# Patient Record
Sex: Female | Born: 1960
Health system: Southern US, Community
[De-identification: ages and names within clinical notes are randomized; demographics above are authoritative.]

## PROBLEM LIST (undated history)

## (undated) DIAGNOSIS — Z9889 Other specified postprocedural states: Secondary | ICD-10-CM

## (undated) DIAGNOSIS — K219 Gastro-esophageal reflux disease without esophagitis: Secondary | ICD-10-CM

## (undated) DIAGNOSIS — M1990 Unspecified osteoarthritis, unspecified site: Secondary | ICD-10-CM

## (undated) DIAGNOSIS — C801 Malignant (primary) neoplasm, unspecified: Secondary | ICD-10-CM

## (undated) DIAGNOSIS — R112 Nausea with vomiting, unspecified: Secondary | ICD-10-CM

## (undated) DIAGNOSIS — R7303 Prediabetes: Secondary | ICD-10-CM

## (undated) HISTORY — PX: LAPAROSCOPIC ABDOMINAL EXPLORATION: SHX6249

## (undated) HISTORY — DX: Prediabetes: R73.03

## (undated) HISTORY — DX: Gastro-esophageal reflux disease without esophagitis: K21.9

## (undated) HISTORY — DX: Unspecified osteoarthritis, unspecified site: M19.90

---

## 1997-12-10 ENCOUNTER — Other Ambulatory Visit: Admission: RE | Admit: 1997-12-10 | Discharge: 1997-12-10 | Payer: Self-pay | Admitting: Gynecology

## 1999-02-23 ENCOUNTER — Other Ambulatory Visit: Admission: RE | Admit: 1999-02-23 | Discharge: 1999-02-23 | Payer: Self-pay | Admitting: Gynecology

## 2000-09-05 ENCOUNTER — Other Ambulatory Visit: Admission: RE | Admit: 2000-09-05 | Discharge: 2000-09-05 | Payer: Self-pay | Admitting: Gynecology

## 2001-09-19 ENCOUNTER — Other Ambulatory Visit: Admission: RE | Admit: 2001-09-19 | Discharge: 2001-09-19 | Payer: Self-pay | Admitting: Gynecology

## 2002-09-27 ENCOUNTER — Other Ambulatory Visit: Admission: RE | Admit: 2002-09-27 | Discharge: 2002-09-27 | Payer: Self-pay | Admitting: Gynecology

## 2003-11-07 ENCOUNTER — Other Ambulatory Visit: Admission: RE | Admit: 2003-11-07 | Discharge: 2003-11-07 | Payer: Self-pay | Admitting: Gynecology

## 2005-04-22 ENCOUNTER — Other Ambulatory Visit: Admission: RE | Admit: 2005-04-22 | Discharge: 2005-04-22 | Payer: Self-pay | Admitting: Gynecology

## 2011-01-15 ENCOUNTER — Other Ambulatory Visit: Payer: Self-pay | Admitting: Gynecology

## 2011-01-15 DIAGNOSIS — R928 Other abnormal and inconclusive findings on diagnostic imaging of breast: Secondary | ICD-10-CM

## 2011-01-26 ENCOUNTER — Ambulatory Visit
Admission: RE | Admit: 2011-01-26 | Discharge: 2011-01-26 | Disposition: A | Discharge status: Home / Self Care | Payer: BC Managed Care – PPO | Source: Ambulatory Visit | Attending: Gynecology | Admitting: Gynecology

## 2011-01-26 DIAGNOSIS — R928 Other abnormal and inconclusive findings on diagnostic imaging of breast: Secondary | ICD-10-CM

## 2013-07-02 ENCOUNTER — Encounter: Payer: Self-pay | Admitting: Family Medicine

## 2014-09-10 LAB — HM COLONOSCOPY

## 2014-12-16 ENCOUNTER — Encounter: Payer: Self-pay | Admitting: Family Medicine

## 2016-02-27 DIAGNOSIS — L568 Other specified acute skin changes due to ultraviolet radiation: Secondary | ICD-10-CM | POA: Diagnosis not present

## 2016-02-27 DIAGNOSIS — L570 Actinic keratosis: Secondary | ICD-10-CM | POA: Diagnosis not present

## 2016-07-16 DIAGNOSIS — Z124 Encounter for screening for malignant neoplasm of cervix: Secondary | ICD-10-CM | POA: Diagnosis not present

## 2016-07-16 DIAGNOSIS — Z01419 Encounter for gynecological examination (general) (routine) without abnormal findings: Secondary | ICD-10-CM | POA: Diagnosis not present

## 2016-07-16 DIAGNOSIS — Z1231 Encounter for screening mammogram for malignant neoplasm of breast: Secondary | ICD-10-CM | POA: Diagnosis not present

## 2016-07-16 DIAGNOSIS — Z6834 Body mass index (BMI) 34.0-34.9, adult: Secondary | ICD-10-CM | POA: Diagnosis not present

## 2016-07-26 DIAGNOSIS — E669 Obesity, unspecified: Secondary | ICD-10-CM | POA: Insufficient documentation

## 2016-07-26 DIAGNOSIS — N393 Stress incontinence (female) (male): Secondary | ICD-10-CM | POA: Insufficient documentation

## 2016-08-12 DIAGNOSIS — Z0001 Encounter for general adult medical examination with abnormal findings: Secondary | ICD-10-CM | POA: Diagnosis not present

## 2016-08-18 DIAGNOSIS — M25519 Pain in unspecified shoulder: Secondary | ICD-10-CM | POA: Diagnosis not present

## 2016-08-18 DIAGNOSIS — K219 Gastro-esophageal reflux disease without esophagitis: Secondary | ICD-10-CM | POA: Diagnosis not present

## 2016-08-19 ENCOUNTER — Encounter: Payer: Self-pay | Admitting: Family Medicine

## 2016-08-19 DIAGNOSIS — M25512 Pain in left shoulder: Secondary | ICD-10-CM | POA: Diagnosis not present

## 2016-08-19 DIAGNOSIS — S4992XA Unspecified injury of left shoulder and upper arm, initial encounter: Secondary | ICD-10-CM | POA: Diagnosis not present

## 2017-07-22 DIAGNOSIS — H2513 Age-related nuclear cataract, bilateral: Secondary | ICD-10-CM | POA: Diagnosis not present

## 2017-07-22 DIAGNOSIS — H11153 Pinguecula, bilateral: Secondary | ICD-10-CM | POA: Diagnosis not present

## 2017-07-22 DIAGNOSIS — H524 Presbyopia: Secondary | ICD-10-CM | POA: Diagnosis not present

## 2017-08-03 DIAGNOSIS — Z124 Encounter for screening for malignant neoplasm of cervix: Secondary | ICD-10-CM | POA: Diagnosis not present

## 2017-08-03 DIAGNOSIS — Z6834 Body mass index (BMI) 34.0-34.9, adult: Secondary | ICD-10-CM | POA: Diagnosis not present

## 2017-08-03 DIAGNOSIS — Z1231 Encounter for screening mammogram for malignant neoplasm of breast: Secondary | ICD-10-CM | POA: Diagnosis not present

## 2017-08-03 DIAGNOSIS — Z01419 Encounter for gynecological examination (general) (routine) without abnormal findings: Secondary | ICD-10-CM | POA: Diagnosis not present

## 2017-08-03 LAB — HM PAP SMEAR: HM Pap smear: NEGATIVE

## 2017-08-06 LAB — HM PAP SMEAR: HM Pap smear: NEGATIVE

## 2017-12-02 ENCOUNTER — Encounter: Payer: Self-pay | Admitting: Family Medicine

## 2017-12-02 ENCOUNTER — Ambulatory Visit (INDEPENDENT_AMBULATORY_CARE_PROVIDER_SITE_OTHER): Payer: BLUE CROSS/BLUE SHIELD | Admitting: Family Medicine

## 2017-12-02 VITALS — BP 120/72 | HR 64 | Temp 97.7°F | Ht 65.25 in | Wt 211.5 lb

## 2017-12-02 DIAGNOSIS — E669 Obesity, unspecified: Secondary | ICD-10-CM

## 2017-12-02 DIAGNOSIS — K219 Gastro-esophageal reflux disease without esophagitis: Secondary | ICD-10-CM | POA: Diagnosis not present

## 2017-12-02 DIAGNOSIS — Z7689 Persons encountering health services in other specified circumstances: Secondary | ICD-10-CM | POA: Diagnosis not present

## 2017-12-02 LAB — CBC WITH DIFFERENTIAL/PLATELET
BASOS ABS: 0 10*3/uL (ref 0.0–0.1)
Basophils Relative: 0.9 % (ref 0.0–3.0)
EOS ABS: 0.1 10*3/uL (ref 0.0–0.7)
Eosinophils Relative: 3.5 % (ref 0.0–5.0)
HEMATOCRIT: 37.8 % (ref 36.0–46.0)
HEMOGLOBIN: 12.8 g/dL (ref 12.0–15.0)
LYMPHS ABS: 1.2 10*3/uL (ref 0.7–4.0)
LYMPHS PCT: 30.2 % (ref 12.0–46.0)
MCHC: 33.8 g/dL (ref 30.0–36.0)
MCV: 86 fl (ref 78.0–100.0)
Monocytes Absolute: 0.3 10*3/uL (ref 0.1–1.0)
Monocytes Relative: 6.5 % (ref 3.0–12.0)
NEUTROS ABS: 2.3 10*3/uL (ref 1.4–7.7)
Neutrophils Relative %: 58.9 % (ref 43.0–77.0)
Platelets: 280 10*3/uL (ref 150.0–400.0)
RBC: 4.39 Mil/uL (ref 3.87–5.11)
RDW: 14.5 % (ref 11.5–15.5)
WBC: 4 10*3/uL (ref 4.0–10.5)

## 2017-12-02 LAB — COMPREHENSIVE METABOLIC PANEL
ALBUMIN: 4.2 g/dL (ref 3.5–5.2)
ALK PHOS: 76 U/L (ref 39–117)
ALT: 21 U/L (ref 0–35)
AST: 20 U/L (ref 0–37)
BILIRUBIN TOTAL: 0.3 mg/dL (ref 0.2–1.2)
BUN: 21 mg/dL (ref 6–23)
CALCIUM: 9.9 mg/dL (ref 8.4–10.5)
CO2: 30 mEq/L (ref 19–32)
CREATININE: 0.93 mg/dL (ref 0.40–1.20)
Chloride: 102 mEq/L (ref 96–112)
GFR: 66.27 mL/min (ref >60.00)
Glucose, Bld: 101 mg/dL — ABNORMAL HIGH (ref 70–99)
Potassium: 4.4 mEq/L (ref 3.5–5.1)
Sodium: 140 mEq/L (ref 135–145)
TOTAL PROTEIN: 7 g/dL (ref 6.0–8.3)

## 2017-12-02 LAB — LIPID PANEL
Cholesterol: 178 mg/dL (ref 0–200)
HDL: 57.4 mg/dL (ref >39.00)
LDL Cholesterol: 98 mg/dL (ref 0–99)
NONHDL: 120.92
Total CHOL/HDL Ratio: 3
Triglycerides: 117 mg/dL (ref 0.0–149.0)
VLDL: 23.4 mg/dL (ref 0.0–40.0)

## 2017-12-02 LAB — VITAMIN D 25 HYDROXY (VIT D DEFICIENCY, FRACTURES): VITD: 26.62 ng/mL — AB (ref 30.00–100.00)

## 2017-12-02 LAB — HEMOGLOBIN A1C: Hgb A1c MFr Bld: 5.8 % (ref 4.6–6.5)

## 2017-12-02 LAB — TSH: TSH: 1.36 u[IU]/mL (ref 0.35–4.50)

## 2017-12-02 MED ORDER — OMEPRAZOLE 20 MG PO CPDR: 20.0000 mg | DELAYED_RELEASE_CAPSULE | Freq: Every day | ORAL | 3 refills | Status: DC

## 2017-12-02 NOTE — Patient Instructions (Addendum)
Try over the counter ranitidine 150 mg once a day for 5-7 days, if symptoms not controlled, increase to twice a day. If not controlled on daily basis, can go to omeprazole 20 mg (printed prescription provided)   Eat lots of vegetables and protein.   Gastroesophageal Reflux Disease, Adult Normally, food travels down the esophagus and stays in the stomach to be digested. However, when a person has gastroesophageal reflux disease (GERD), food and stomach acid move back up into the esophagus. When this happens, the esophagus becomes sore and inflamed. Over time, GERD can create small holes (ulcers) in the lining of the esophagus. What are the causes? This condition is caused by a problem with the muscle between the esophagus and the stomach (lower esophageal sphincter, or LES). Normally, the LES muscle closes after food passes through the esophagus to the stomach. When the LES is weakened or abnormal, it does not close properly, and that allows food and stomach acid to go back up into the esophagus. The LES can be weakened by certain dietary substances, medicines, and medical conditions, including:  Tobacco use.  Pregnancy.  Having a hiatal hernia.  Heavy alcohol use.  Certain foods and beverages, such as coffee, chocolate, onions, and peppermint.  What increases the risk? This condition is more likely to develop in:  People who have an increased body weight.  People who have connective tissue disorders.  People who use NSAID medicines.  What are the signs or symptoms? Symptoms of this condition include:  Heartburn.  Difficult or painful swallowing.  The feeling of having a lump in the throat.  Abitter taste in the mouth.  Bad breath.  Having a large amount of saliva.  Having an upset or bloated stomach.  Belching.  Chest pain.  Shortness of breath or wheezing.  Ongoing (chronic) cough or a night-time cough.  Wearing away of tooth enamel.  Weight loss.  Different  conditions can cause chest pain. Make sure to see your health care provider if you experience chest pain. How is this diagnosed? Your health care provider will take a medical history and perform a physical exam. To determine if you have mild or severe GERD, your health care provider may also monitor how you respond to treatment. You may also have other tests, including:  An endoscopy toexamine your stomach and esophagus with a small camera.  A test thatmeasures the acidity level in your esophagus.  A test thatmeasures how much pressure is on your esophagus.  A barium swallow or modified barium swallow to show the shape, size, and functioning of your esophagus.  How is this treated? The goal of treatment is to help relieve your symptoms and to prevent complications. Treatment for this condition may vary depending on how severe your symptoms are. Your health care provider may recommend:  Changes to your diet.  Medicine.  Surgery.  Follow these instructions at home: Diet  Follow a diet as recommended by your health care provider. This may involve avoiding foods and drinks such as: ? Coffee and tea (with or without caffeine). ? Drinks that containalcohol. ? Energy drinks and sports drinks. ? Carbonated drinks or sodas. ? Chocolate and cocoa. ? Peppermint and mint flavorings. ? Garlic and onions. ? Horseradish. ? Spicy and acidic foods, including peppers, chili powder, curry powder, vinegar, hot sauces, and barbecue sauce. ? Citrus fruit juices and citrus fruits, such as oranges, lemons, and limes. ? Tomato-based foods, such as red sauce, chili, salsa, and pizza with red sauce. ?  Fried and fatty foods, such as donuts, french fries, potato chips, and high-fat dressings. ? High-fat meats, such as hot dogs and fatty cuts of red and white meats, such as rib eye steak, sausage, ham, and bacon. ? High-fat dairy items, such as whole milk, butter, and cream cheese.  Eat small, frequent  meals instead of large meals.  Avoid drinking large amounts of liquid with your meals.  Avoid eating meals during the 2-3 hours before bedtime.  Avoid lying down right after you eat.  Do not exercise right after you eat. General instructions  Pay attention to any changes in your symptoms.  Take over-the-counter and prescription medicines only as told by your health care provider. Do not take aspirin, ibuprofen, or other NSAIDs unless your health care provider told you to do so.  Do not use any tobacco products, including cigarettes, chewing tobacco, and e-cigarettes. If you need help quitting, ask your health care provider.  Wear loose-fitting clothing. Do not wear anything tight around your waist that causes pressure on your abdomen.  Raise (elevate) the head of your bed 6 inches (15cm).  Try to reduce your stress, such as with yoga or meditation. If you need help reducing stress, ask your health care provider.  If you are overweight, reduce your weight to an amount that is healthy for you. Ask your health care provider for guidance about a safe weight loss goal.  Keep all follow-up visits as told by your health care provider. This is important. Contact a health care provider if:  You have new symptoms.  You have unexplained weight loss.  You have difficulty swallowing, or it hurts to swallow.  You have wheezing or a persistent cough.  Your symptoms do not improve with treatment.  You have a hoarse voice. Get help right away if:  You have pain in your arms, neck, jaw, teeth, or back.  You feel sweaty, dizzy, or light-headed.  You have chest pain or shortness of breath.  You vomit and your vomit looks like blood or coffee grounds.  You faint.  Your stool is bloody or black.  You cannot swallow, drink, or eat. This information is not intended to replace advice given to you by your health care provider. Make sure you discuss any questions you have with your health  care provider. Document Released: 02/17/2005 Document Revised: 10/08/2015 Document Reviewed: 09/04/2014 Elsevier Interactive Patient Education  [employer].

## 2017-12-02 NOTE — Progress Notes (Signed)
Subjective:    Patient ID: Nicole Johnson, female    DOB: 10/07/1960, 57 y.o.   MRN: [id number]  HPI This is a 57 yo female who presents today to establish care. She is married. 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 Has a son who lives with them (learning disability).  Had been seeing Dr. Juleen China who retired, did not care for his replacement.  Very busy life, little time for personal activities. Husband 10 years older.   Last CPE- 13 months ago Mammo- 08/03/17 Pap- gyn, menopause at age 2 Colonoscopy- 09/10/14 Tdap- unsure Flu- annual Eye- every year Dental- regular Exercise- active at work Sleep- 5-6 hours a night Diet- eats a biscuit most mornings, eats salads, likes fruit more than vegetables, drinks some soda and sweet tea.    Has history of knee pain, does well with shoe inserts. Has some wrist pain with her work.   History of GERD- currently taking omperazole tc, out of protonix. Doing ok. Triggers include soda, tomato products, rich foods, eating late.   No chest pain, no SOB, no dizziness, allergies this spring/summer- nasal congestion, PND, taking otc antihistamine with some relief. No diarrhea, no constipation, no urinary complaints.    Past Medical History:  Diagnosis Date  . Arthritis   . GERD (gastroesophageal reflux disease)    History reviewed. No pertinent surgical history. Family History  Problem Relation Age of Onset  . Diabetes Mother   . Stroke Mother   . Cancer Father   . Diabetes Father   . Diabetes Sister   . Diabetes Brother   . Heart disease Maternal Grandmother   . Cancer Maternal Grandfather   . Heart disease Maternal Grandfather    Social History   Tobacco Use  . Smoking status: Never Smoker  . Smokeless tobacco: Never Used  Substance Use Topics  . Alcohol use: Yes    Comment: occ  . Drug use: Never      Review of Systems Per HPI    Objective:   Physical Exam Physical Exam  Constitutional: Oriented  to person, place, and time. She appears well-developed and well-nourished.  HENT:  Head: Normocephalic and atraumatic.  Eyes: Conjunctivae are normal.  Neck: Normal range of motion. Neck supple.  Cardiovascular: Normal rate, regular rhythm and normal heart sounds.   Pulmonary/Chest: Effort normal and breath sounds normal.  Musculoskeletal: No edema.  Neurological: Alert and oriented to person, place, and time.  Skin: Skin is warm and dry.  Psychiatric: Normal mood and affect. Behavior is normal. Judgment and thought content normal.  Vitals reviewed.     BP 120/72 (BP Location: Right Arm, Patient Position: Sitting, Cuff Size: Large)   Pulse 64   Temp 97.7 F (36.5 C) (Oral)   Ht 5' 5.25" (1.657 m)   Wt 211 lb 8 oz (95.9 kg)   SpO2 97%   BMI 34.93 kg/m      Assessment & Plan:  1. Encounter to establish care - Will request records from previous provider - Follow up in 1 year unless sooner follow up   2. Obesity (BMI 30.0-34.9) - discussed healthier food choices - encouraged increased sleep, time for activities she enjoys - CBC with Differential - Comprehensive metabolic panel - TSH - Vitamin D, 25-hydroxy - Hemoglobin A1c - Lipid Panel  3. Gastroesophageal reflux disease, esophagitis presence not specified - Provided written and verbal information regarding diagnosis and treatment. - will try otc H2 blocker and if not adequate  control, will try omeprazole 20 mg (printed prescription provided) and if not adequate symptom management can go back to Protonix   Olean Reeeborah Adlean Hardeman, FNP-BC  [facility], [facility]  12/02/2017 9:21 AM

## 2017-12-26 ENCOUNTER — Encounter: Payer: Self-pay | Admitting: Family Medicine

## 2018-08-24 ENCOUNTER — Ambulatory Visit: Payer: Self-pay | Admitting: Family Medicine

## 2018-08-24 ENCOUNTER — Ambulatory Visit (INDEPENDENT_AMBULATORY_CARE_PROVIDER_SITE_OTHER): Payer: BLUE CROSS/BLUE SHIELD | Admitting: Primary Care

## 2018-08-24 ENCOUNTER — Encounter: Payer: Self-pay | Admitting: Primary Care

## 2018-08-24 ENCOUNTER — Other Ambulatory Visit: Payer: Self-pay

## 2018-08-24 VITALS — BP 148/74 | HR 73 | Temp 98.2°F | Ht 65.25 in | Wt 221.0 lb

## 2018-08-24 DIAGNOSIS — M25562 Pain in left knee: Secondary | ICD-10-CM | POA: Diagnosis not present

## 2018-08-24 MED ORDER — DICLOFENAC SODIUM 75 MG PO TBEC: 75.0000 mg | DELAYED_RELEASE_TABLET | Freq: Two times a day (BID) | ORAL | 0 refills | Status: DC | PRN

## 2018-08-24 NOTE — Telephone Encounter (Addendum)
Lucynda RN also noted:  Here are the triage notes for Nicole Johnson's pt.  I let her know someone would be calling her in regards to an appt.  I verified her e mail and phone number [phone] for a video visit if needed.  She was agreeable to this plan   Pt scheduled webex with Allayne Gitelman NP 08/24/18 at 11 AM. Please see nse triage note from [facility].

## 2018-08-24 NOTE — Progress Notes (Signed)
Subjective:    Patient ID: Nicole Johnson, female    DOB: 03/18/1961, 58 y.o.   MRN: [id number]  HPI  Nicole Johnson is a 58 year old female with a history of obesity, chronic knee pain who presents today with a chief complaint of knee pain.  Her pain is located to the posterior right knee which began four days ago. Her pain is worse when sitting for prolonged periods of time and when rising from a seated position, also with knee flexion. 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 She also reports radiation of pain down to her right lower extremity to mid calf. She describes this as achy. She's been icing her knee, also taking Advil 400 mg every two hours with little temporary improvement.    She denies falls/injury, numbness/tinlging, weakness, color change, back pain. She think she's noticed slight swelling to the popliteal fossa. She has a history of chronic left knee pain and was once receiving injections, no recent injection as her left knee hasn't bothered.   Review of Systems  Musculoskeletal: Positive for arthralgias.  Skin: Negative for color change.  Neurological: Negative for weakness and numbness.       Past Medical History:  Diagnosis Date  . Arthritis   . GERD (gastroesophageal reflux disease)      Social History   Socioeconomic History  . Marital status: Married    Spouse name: Not on file  . Number of children: Not on file  . Years of education: Not on file  . Highest education level: Not on file  Occupational History  . Not on file  Social Needs  . Financial resource strain: Not on file  . Food insecurity:    Worry: Not on file    Inability: Not on file  . Transportation needs:    Medical: Not on file    Non-medical: Not on file  Tobacco Use  . Smoking status: Never Smoker  . Smokeless tobacco: Never Used  Substance and Sexual Activity   . Alcohol use: Yes    Comment: occ  . Drug use: Never  . Sexual activity: Yes    Partners: Male  Lifestyle  . Physical activity:    Days per week: Not on file    Minutes per session: Not on file  . Stress: Not on file  Relationships  . Social connections:    Talks on phone: Not on file    Gets together: Not on file    Attends religious service: Not on file    Active member of club or organization: Not on file    Attends meetings of clubs or organizations: Not on file    Relationship status: Not on file  . Intimate partner violence:    Fear of current or ex partner: Not on file    Emotionally abused: Not on file    Physically abused: Not on file    Forced sexual activity: Not on file  Other Topics Concern  . Not on file  Social History Narrative  . Not on file    No past surgical history on file.  Family History  Problem Relation Age of Onset  . Diabetes Mother   . Stroke Mother   . Cancer Father   . Diabetes Father   . Diabetes Sister   . Diabetes Brother   . Heart disease Maternal Grandmother   .  Cancer Maternal Grandfather   . Heart disease Maternal Grandfather     Allergies  Allergen Reactions  . Meloxicam Nausea Only  . Tramadol Nausea Only    Current Outpatient Medications on File Prior to Visit  Medication Sig Dispense Refill  . Calcium Citrate-Vitamin D (CALCITRATE/VITAMIN D PO) Calcitrate-Vitamin D    . omeprazole (PRILOSEC) 20 MG capsule Take 1 capsule (20 mg total) by mouth daily. 90 capsule 3  . pantoprazole (PROTONIX) 40 MG tablet pantoprazole 40 mg tablet,delayed release     No current facility-administered medications on file prior to visit.     BP (!) 148/74   Pulse 73   Temp 98.2 F (36.8 C) (Oral)   Ht 5' 5.25" (1.657 m)   Wt 221 lb (100.2 kg)   LMP 01/11/2011   SpO2 98%   BMI 36.50 kg/m    Objective:   Physical Exam  Musculoskeletal:     Right knee: She exhibits decreased range of motion. She exhibits no swelling. No  tenderness found.       Legs:     Comments: Decrease in ROM with flexion, no obvious swelling to right popliteal fossa or anterior knee. 5/5 strength to bilateral lower extremities.   Skin: Skin is warm and dry.           Assessment & Plan:

## 2018-08-24 NOTE — Assessment & Plan Note (Signed)
Likely tendon aggravation from working on uneven surfaces on the roof. No trauma.  Exam today fairly stable, no alarm signs.  Will have her try diclofenac ER 75 mg BID PRN, also alternate with Tylenol if needed. Strongly advised she work on stretching and ROM exercises. She will update next week if no improvement.

## 2018-08-24 NOTE — Telephone Encounter (Signed)
I was painting a house for 2 days.   I was up on a ladder for 2 days.   I get shots in my knee for hematoid.  Do y'all do shots there in your office?   The doctor that has done the shots before I have not seen in 3 yrs.   I didn't know if I would need to go through my primary care physician to get that done or what.   You're having numbness with tingling in right foot.   Knee started hurting 2 days ago.  It's getting worse the pain in the knee.  Due to the coronavirus pandemic they are doing video chat/phone call visits.   She is ok with this.   I verified her phone number and e mail address.  I let her know someone would call her from the office.  I sent these notes to [facility] office.   Reason for Disposition . [1] MODERATE pain (e.g., interferes with normal activities, limping) AND [2] present > 3 days  Answer Assessment - Initial Assessment Questions 1. LOCATION and RADIATION: "Where is the pain located?"      Behind my right knee with numbness and tingling in my right foot. 2. QUALITY: "What does the pain feel like?"  (e.g., sharp, dull, aching, burning)     I can walk but carefully.  I'm having trouble putting weight on my leg.  Numbness and tingling.   It really started bothering me yesterday afternoon. Do y'all give shots in knees. 3. SEVERITY: "How bad is the pain?" "What does it keep you from doing?"   (Scale 1-10; or mild, moderate, severe)   -  MILD (1-3): doesn't interfere with normal activities    -  MODERATE (4-7): interferes with normal activities (e.g., work or school) or awakens from sleep, limping    -  SEVERE (8-10): excruciating pain, unable to do any normal activities, unable to walk     5 on pain scale. 4. ONSET: "When did the pain start?" "Does it come and go, or is it there all the time?"     Yesterday afternoon. 5. RECURRENT: "Have you had this pain before?" If so, ask: "When, and what happened then?"     No swelling in right leg.  I have discomfort in my  calf but no swelling or warmth.     No history of blood clots.     6. 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   I work in [employer]. 7. AGGRAVATING FACTORS: "What makes the knee pain worse?" (e.g., walking, climbing stairs, running)     Walking.   I have a pulling sensation  From back of my knee down to not even my ankle. 8. ASSOCIATED SYMPTOMS: "Is there any swelling or redness of the knee?"     No 9. OTHER SYMPTOMS: "Do you have any other symptoms?" (e.g., chest pain, difficulty breathing, fever, calf pain)     See above 10. PREGNANCY: "Is there any chance you are pregnant?" "When was your last menstrual period?"       No  Protocols used: KNEE PAIN-A-AH

## 2018-08-24 NOTE — Patient Instructions (Signed)
You may take diclofenac Er 75 mg tablets twice daily as needed for knee pain.  You may also alternate with Tylenol 500 mg every 8 hours as needed.  Work on stretching and range of motion exercises as discussed.   Consider purchasing a knee sleeve for support.  Please call us if no improvement by early next week.   It was a pleasure meeting you!

## 2018-08-24 NOTE — Addendum Note (Signed)
Addended by: Patience Musca on: 08/24/2018 09:16 AM   Modules accepted: Kipp Brood

## 2018-09-11 ENCOUNTER — Encounter: Payer: Self-pay | Admitting: Family Medicine

## 2018-09-11 ENCOUNTER — Other Ambulatory Visit: Payer: Self-pay

## 2018-09-11 ENCOUNTER — Ambulatory Visit (INDEPENDENT_AMBULATORY_CARE_PROVIDER_SITE_OTHER)
Admission: RE | Admit: 2018-09-11 | Discharge: 2018-09-11 | Disposition: A | Discharge status: Home / Self Care | Payer: BLUE CROSS/BLUE SHIELD | Source: Ambulatory Visit | Attending: Family Medicine | Admitting: Family Medicine

## 2018-09-11 ENCOUNTER — Ambulatory Visit (INDEPENDENT_AMBULATORY_CARE_PROVIDER_SITE_OTHER): Payer: BLUE CROSS/BLUE SHIELD | Admitting: Family Medicine

## 2018-09-11 VITALS — BP 130/66 | HR 91 | Temp 98.4°F | Ht 65.25 in | Wt 219.5 lb

## 2018-09-11 DIAGNOSIS — M25561 Pain in right knee: Secondary | ICD-10-CM

## 2018-09-11 DIAGNOSIS — M7051 Other bursitis of knee, right knee: Secondary | ICD-10-CM

## 2018-09-11 MED ORDER — METHYLPREDNISOLONE ACETATE 40 MG/ML IJ SUSP
80.0000 mg | Freq: Once | INTRAMUSCULAR | Status: AC
  Administered 2018-09-11: 13:00:00 80 mg via INTRA_ARTICULAR

## 2018-09-11 NOTE — Progress Notes (Signed)
Nicole Lalli T. Nicole Borras, MD Primary Care and Sports Medicine [facility] at [facility] [address] [place], [address] Phone: [phone]  FAX: [phone]  Nicole Johnson - 58 y.o. female  MRN [id number]  Date of Birth: [birth year]  Visit Date: 09/11/2018  PCP: Nicole Belfast, FNP  Referred by: Nicole Belfast, FNP  Chief Complaint  Patient presents with  . Knee Pain    Right   Subjective:   Nicole Johnson is a 58 y.o. very pleasant female patient who presents with the following:  Very pleasant 58 year old lady who has a history of left-sided knee arthritis and she presents today after an insidious onset of right-sided knee pain.  No significant swelling and no mechanical symptoms or buckling.  She saw Mrs. Clark in the office 17 days ago, and that point she recommended that she use some ice and gave her some diclofenac.  At this point, her symptoms have not really improved at all, and she is having quite a bit of pain.  She is having some pain distal to the joint line and she is having pain on the medial joint line greater than the patellofemoral joint.  She also was wearing a compression knee brace, and this caused some indentation and uncomfortable feeling within the last 2 weeks.  Wore a knee brace and compressed a lot.  Was pretty tight, about a 17 days ago. Felt pretty good and felt cut in some.  Had been in her.  No injury.   PES R Joint line med PF  Past Medical History, Surgical History, Social History, Family History, Problem List, Medications, and Allergies have been reviewed and updated if relevant.  Patient Active Problem List   Diagnosis Date Noted  . Acute pain of left knee 08/24/2018  . Obesity (BMI 30.0-34.9) 12/02/2017  . Gastroesophageal reflux disease 12/02/2017  . Obesity with body mass index 30 or greater 07/26/2016  . Stress incontinence of urine 07/26/2016    Past Medical History:  Diagnosis Date  . Arthritis    . GERD (gastroesophageal reflux disease)     History reviewed. No pertinent surgical history.  Social History   Socioeconomic History  . Marital status: Married    Spouse name: Not on file  . Number of children: Not on file  . Years of education: Not on file  . Highest education level: Not on file  Occupational History  . Not on file  Social Needs  . Financial resource strain: Not on file  . Food insecurity:    Worry: Not on file    Inability: Not on file  . Transportation needs:    Medical: Not on file    Non-medical: Not on file  Tobacco Use  . Smoking status: Never Smoker  . Smokeless tobacco: Never Used  Substance and Sexual Activity  . Alcohol use: Yes    Comment: occ  . Drug use: Never  . Sexual activity: Yes    Partners: Male  Lifestyle  . Physical activity:    Days per week: Not on file    Minutes per session: Not on file  . Stress: Not on file  Relationships  . Social connections:    Talks on phone: Not on file    Gets together: Not on file    Attends religious service: Not on file    Active member of club or organization: Not on file    Attends meetings of clubs or organizations: Not on  file    Relationship status: Not on file  . Intimate partner violence:    Fear of current or ex partner: Not on file    Emotionally abused: Not on file    Physically abused: Not on file    Forced sexual activity: Not on file  Other Topics Concern  . Not on file  Social History Narrative  . Not on file    Family History  Problem Relation Age of Onset  . Diabetes Mother   . Stroke Mother   . Cancer Father   . Diabetes Father   . Diabetes Sister   . Diabetes Brother   . Heart disease Maternal Grandmother   . Cancer Maternal Grandfather   . Heart disease Maternal Grandfather     Allergies  Allergen Reactions  . Meloxicam Nausea Only  . Tramadol Nausea Only    Medication list reviewed and updated in full in [place] [facility].  GEN: No fevers, chills.  Nontoxic. Primarily MSK c/o today. MSK: Detailed in the HPI GI: tolerating PO intake without difficulty Neuro: No numbness, parasthesias, or tingling associated. Otherwise the pertinent positives of the ROS are noted above.   Objective:   BP 130/66   Pulse 91   Temp 98.4 F (36.9 C) (Oral)   Ht 5' 5.25" (1.657 m)   Wt 219 lb 8 oz (99.6 kg)   LMP 01/11/2011   BMI 36.25 kg/m    GEN: WDWN, NAD, Non-toxic, Alert & Oriented x 3 HEENT: Atraumatic, Normocephalic.  Ears and Nose: No external deformity. EXTR: No clubbing/cyanosis/edema NEURO: Normal gait.  PSYCH: Normally interactive. Conversant. Not depressed or anxious appearing.  Calm demeanor.    Full extension, flexion to 115 degrees.  This is on the right knee.  Stable to varus and valgus stress.  Lockman is negative.  Anterior and posterior drawer is negative.  Flexion pinch is positive.  McMurray's is positive for pain only.  The patient has significant pain at the pes bursa.  Pain with loading of the medial lateral patellar facets.  Medial joint line tenderness is significant.  Radiology: Dg Knee 4 Views W/patella Right  Result Date: 09/11/2018 CLINICAL DATA:  58 year old female with right knee pain and no injury EXAM: RIGHT KNEE - COMPLETE 4+ VIEW COMPARISON:  None. FINDINGS: Right: No acute displaced fracture. No significant degenerative changes. No evidence of joint effusion. No radiopaque foreign body or focal soft tissue swelling. Relatively unremarkable sunrise view. Left: Medial joint space narrowing with marginal osteophyte formation and no displaced fracture. IMPRESSION: No acute bony abnormality. No significant right-sided osteoarthritis. Early osteoarthritis on the left, greatest at the medial compartment. Electronically Signed   By: Gilmer MorJaime  Wagner D.O.   On: 09/11/2018 12:36     Assessment and Plan:   Right knee pain, unspecified chronicity - Plan: DG Knee 4 Views W/Patella Right  Pes anserinus bursitis of right  knee - Plan: DG Knee 4 Views W/Patella Right, methylPREDNISolone acetate (DEPO-MEDROL) injection 80 mg  Degenerative meniscal tear is highest on the differential, or the patient's osteoarthritic change in the medial and patellofemoral compartment is worse than apparent on the plain film.  I am going to do an intra-articular injection as well as a pes bursa injection and have the patient start doing some gentle range of motion and strengthening.  Aspiration/Injection Procedure Note Nicole Johnson [birth year] Date of procedure: 09/11/2018  Procedure: Large Joint Aspiration / Injection of Knee, R Indications: Pain  Procedure Details Patient verbally consented to procedure. Risks (  including potential rare risk of infection), benefits, and alternatives explained. Sterilely prepped with Chloraprep. Ethyl cholride used for anesthesia. 6 cc Lidocaine 1% mixed with 1 mL Depo-Medrol 40 mg injected using the anteromedial approach without difficulty. No complications with procedure and tolerated well. Patient had decreased pain post-injection.  Medication: Depo-Medrol 40 mg  Aspiration/Injection Procedure Note Nicole Johnson July 05, 1960 Date of procedure: 09/11/2018  Procedure: Large Joint Aspiration / Injection of Knee, Pes Bursa, R Indications: Pain  Procedure Details Verbal consent was obtained. Risks (including rare infection, skin lightening, and potential atrophy), benefits, and alternatives explained. Sterilely prepped with Chloraprep. Ethyl chloride for anesthesia. Under sterile conditions, 2 cc of Lidocaine 1% and 1 cc of Depo-Medrol 40 mg injected directly on the pes anserinus perpendicularly taking the needle to bone then slightly withdrawing. No resistance encountered. No complications with procedure and tolerated well. Patient had decreased pain post-injection. 22 gauge 1 1/2 inch needle  Medication: Depo-Medrol 40 mg   Follow-up: No follow-ups on file.  Meds ordered this encounter   Medications  . methylPREDNISolone acetate (DEPO-MEDROL) injection 80 mg   Orders Placed This Encounter  Procedures  . DG Knee 4 Views W/Patella Right    Signed,  Karleen Hampshire T. Asianae Minkler, MD   Outpatient Encounter Medications as of 09/11/2018  Medication Sig  . Calcium Citrate-Vitamin D (CALCITRATE/VITAMIN D PO) Calcitrate-Vitamin D  . diclofenac (VOLTAREN) 75 MG EC tablet Take 1 tablet (75 mg total) by mouth 2 (two) times daily as needed for moderate pain.  Marland Kitchen omeprazole (PRILOSEC) 20 MG capsule Take 1 capsule (20 mg total) by mouth daily.  . [DISCONTINUED] pantoprazole (PROTONIX) 40 MG tablet pantoprazole 40 mg tablet,delayed release  . [EXPIRED] methylPREDNISolone acetate (DEPO-MEDROL) injection 80 mg    No facility-administered encounter medications on file as of 09/11/2018.

## 2018-11-01 DIAGNOSIS — Z6835 Body mass index (BMI) 35.0-35.9, adult: Secondary | ICD-10-CM | POA: Diagnosis not present

## 2018-11-01 DIAGNOSIS — Z01419 Encounter for gynecological examination (general) (routine) without abnormal findings: Secondary | ICD-10-CM | POA: Diagnosis not present

## 2018-11-01 DIAGNOSIS — Z1231 Encounter for screening mammogram for malignant neoplasm of breast: Secondary | ICD-10-CM | POA: Diagnosis not present

## 2018-11-01 LAB — HM MAMMOGRAPHY

## 2018-12-01 ENCOUNTER — Telehealth: Payer: Self-pay

## 2018-12-01 NOTE — Telephone Encounter (Signed)
Left detailed VM w COVID screen and back door lab info   

## 2018-12-03 ENCOUNTER — Other Ambulatory Visit: Payer: Self-pay | Admitting: Family Medicine

## 2018-12-03 DIAGNOSIS — E559 Vitamin D deficiency, unspecified: Secondary | ICD-10-CM

## 2018-12-03 DIAGNOSIS — R7303 Prediabetes: Secondary | ICD-10-CM

## 2018-12-03 DIAGNOSIS — E669 Obesity, unspecified: Secondary | ICD-10-CM

## 2018-12-03 NOTE — Progress Notes (Signed)
Labs entered for cpe 

## 2018-12-05 ENCOUNTER — Other Ambulatory Visit: Payer: Self-pay

## 2018-12-05 ENCOUNTER — Other Ambulatory Visit (INDEPENDENT_AMBULATORY_CARE_PROVIDER_SITE_OTHER): Payer: BC Managed Care – PPO

## 2018-12-05 DIAGNOSIS — R7303 Prediabetes: Secondary | ICD-10-CM | POA: Diagnosis not present

## 2018-12-05 DIAGNOSIS — E559 Vitamin D deficiency, unspecified: Secondary | ICD-10-CM

## 2018-12-05 DIAGNOSIS — E669 Obesity, unspecified: Secondary | ICD-10-CM

## 2018-12-05 LAB — VITAMIN D 25 HYDROXY (VIT D DEFICIENCY, FRACTURES): VITD: 39.03 ng/mL (ref 30.00–100.00)

## 2018-12-05 LAB — CBC WITH DIFFERENTIAL/PLATELET
Basophils Absolute: 0 10*3/uL (ref 0.0–0.1)
Basophils Relative: 0.9 % (ref 0.0–3.0)
Eosinophils Absolute: 0.2 10*3/uL (ref 0.0–0.7)
Eosinophils Relative: 3.6 % (ref 0.0–5.0)
HCT: 36.9 % (ref 36.0–46.0)
Hemoglobin: 12.4 g/dL (ref 12.0–15.0)
Lymphocytes Relative: 29.2 % (ref 12.0–46.0)
Lymphs Abs: 1.4 10*3/uL (ref 0.7–4.0)
MCHC: 33.6 g/dL (ref 30.0–36.0)
MCV: 85.2 fl (ref 78.0–100.0)
Monocytes Absolute: 0.4 10*3/uL (ref 0.1–1.0)
Monocytes Relative: 7.6 % (ref 3.0–12.0)
Neutro Abs: 2.8 10*3/uL (ref 1.4–7.7)
Neutrophils Relative %: 58.7 % (ref 43.0–77.0)
Platelets: 267 10*3/uL (ref 150.0–400.0)
RBC: 4.33 Mil/uL (ref 3.87–5.11)
RDW: 13.6 % (ref 11.5–15.5)
WBC: 4.7 10*3/uL (ref 4.0–10.5)

## 2018-12-05 LAB — COMPREHENSIVE METABOLIC PANEL
ALT: 18 U/L (ref 0–35)
AST: 18 U/L (ref 0–37)
Albumin: 4.5 g/dL (ref 3.5–5.2)
Alkaline Phosphatase: 71 U/L (ref 39–117)
BUN: 23 mg/dL (ref 6–23)
CO2: 32 mEq/L (ref 19–32)
Calcium: 9.8 mg/dL (ref 8.4–10.5)
Chloride: 101 mEq/L (ref 96–112)
Creatinine, Ser: 0.87 mg/dL (ref 0.40–1.20)
GFR: 67.09 mL/min (ref >60.00)
Glucose, Bld: 99 mg/dL (ref 70–99)
Potassium: 4 mEq/L (ref 3.5–5.1)
Sodium: 139 mEq/L (ref 135–145)
Total Bilirubin: 0.4 mg/dL (ref 0.2–1.2)
Total Protein: 7.2 g/dL (ref 6.0–8.3)

## 2018-12-05 LAB — HEMOGLOBIN A1C: Hgb A1c MFr Bld: 5.5 % (ref 4.6–6.5)

## 2018-12-08 ENCOUNTER — Ambulatory Visit (INDEPENDENT_AMBULATORY_CARE_PROVIDER_SITE_OTHER): Payer: BC Managed Care – PPO | Admitting: Family Medicine

## 2018-12-08 ENCOUNTER — Other Ambulatory Visit: Payer: Self-pay

## 2018-12-08 VITALS — BP 126/80 | HR 85 | Temp 98.3°F | Resp 20 | Ht 65.0 in | Wt 215.0 lb

## 2018-12-08 DIAGNOSIS — K219 Gastro-esophageal reflux disease without esophagitis: Secondary | ICD-10-CM | POA: Diagnosis not present

## 2018-12-08 DIAGNOSIS — Z0000 Encounter for general adult medical examination without abnormal findings: Secondary | ICD-10-CM

## 2018-12-08 DIAGNOSIS — Z23 Encounter for immunization: Secondary | ICD-10-CM

## 2018-12-08 MED ORDER — OMEPRAZOLE 20 MG PO CPDR: 20.0000 mg | DELAYED_RELEASE_CAPSULE | Freq: Every day | ORAL | 3 refills | Status: DC

## 2018-12-08 NOTE — Progress Notes (Signed)
Subjective:    Patient ID: Nicole Johnson, female    DOB: [birth year], 58 y.o.   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 She and her husband are with a different company and she feels that they are treating them better. She has some more time for herself.    Last CPE- 11/2017 Mammo- 11/01/2018 Pap- 08/03/2017, negative, negative HPV Colonoscopy- 09/10/2014, 10 year recall Tdap- unsure, will have today Flu- annual Eye- regular Dental- regular Exercise- active with her work, [employer], gardening.   Right knee is getting better. Has taken several months but she has noticed improved ROM.   GERD- well controlled on daily prilosec, and if she avoids food triggers. Has symptoms about 2/x week.   Obesity- has been up and down with weight. Has gone to 1/2 sweet/unsweet tea. Has decreased eating out. Has been making better food choices.   Past Medical History:  Diagnosis Date  . Arthritis   . GERD (gastroesophageal reflux disease)    No past surgical history on file. Family History  Problem Relation Age of Onset  . Diabetes Mother   . Stroke Mother   . Cancer Father   . Diabetes Father   . Diabetes Sister   . Diabetes Brother   . Heart disease Maternal Grandmother   . Cancer Maternal Grandfather   . Heart disease Maternal Grandfather    Social History   Tobacco Use  . Smoking status: Never Smoker  . Smokeless tobacco: Never Used  Substance Use Topics  . Alcohol use: Yes    Comment: occ  . Drug use: Never      Review of Systems  Constitutional: Negative.   HENT: Negative.   Eyes: Negative.   Respiratory: Negative.   Cardiovascular: Negative.   Gastrointestinal:       GERD- see HPI  Endocrine: Negative.   Genitourinary: Negative.   Musculoskeletal:       Knee pain, see HPI  Skin:       Has noticed some dry patches on left ear lobe   Allergic/Immunologic: Negative.   Neurological: Negative.   Hematological: Negative.   Psychiatric/Behavioral: Negative.        Objective:   Physical Exam Physical Exam  Constitutional: She is oriented to person, place, and time. She appears well-developed and well-nourished. No distress.  HENT:  Head: Normocephalic and atraumatic.  Right Ear: External ear normal.  Left Ear: Top of pinna with slightly rough texture, no discreet lesions.  Nose: Nose normal.  Mouth/Throat: Oropharynx is clear and moist. No oropharyngeal exudate.  Eyes: Conjunctivae are normal. Pupils are equal, round, and reactive to light.  Neck: Normal range of motion. Neck supple. No JVD present. No thyromegaly present.  Cardiovascular: Normal rate, regular rhythm, normal heart sounds and intact distal pulses.   Pulmonary/Chest: Effort normal and breath sounds normal. Right breast exhibits no inverted nipple, no mass, no nipple discharge, no skin change and no tenderness. Left breast exhibits no inverted nipple, no mass, no nipple discharge, no skin change and no tenderness. Breasts are symmetrical.  Abdominal: Soft. Bowel sounds are normal. She exhibits no distension and no mass. There is no tenderness. There is no rebound and no guarding.  Musculoskeletal: Normal range of motion. She exhibits no edema or tenderness.  Lymphadenopathy:    She has no cervical adenopathy.  Neurological: She is alert and oriented to person, place,  and time. Skin: Skin is warm and dry. She is not diaphoretic.  Psychiatric: She has a normal mood and affect. Her behavior is normal. Judgment and thought content normal.  Vitals reviewed.  BP 126/80   Pulse 85   Temp 98.3 F (36.8 C)   Resp 20   Ht 5\' 5"  (1.651 m)   Wt 215 lb (97.5 kg)   LMP 01/11/2011   BMI 35.78 kg/m  Wt Readings from Last 3 Encounters:  12/08/18 215 lb (97.5 kg)  09/11/18 219 lb 8 oz (99.6 kg)  08/24/18 221 lb (100.2 kg)   Depression screen [facility]9 12/08/2018  12/02/2017  Decreased Interest 0 0  Down, Depressed, Hopeless 0 0  PHQ - 2 Score 0 0       Assessment & Plan:  1. Annual physical exam - Discussed and encouraged healthy lifestyle choices- adequate sleep, regular exercise, stress management and healthy food choices.   2. Gastroesophageal reflux disease, esophagitis presence not specified - well controlled on omperazole, encouraged avoidance of triggers - omeprazole (PRILOSEC) 20 MG capsule; Take 1 capsule (20 mg total) by mouth daily.  Dispense: 90 capsule; Refill: 3  3. Need for Tdap vaccination - Tdap vaccine greater than or equal to 7yo IM   Olean Reeeborah Gessner, FNP-BC  [facility], [facility]  12/09/2018 8:15 AM

## 2018-12-08 NOTE — Patient Instructions (Signed)
Good to see you today  Consider shingrix vaccine against shingles- ask at your pharmacy   Health Maintenance, Female Adopting a healthy lifestyle and getting preventive care are important in promoting health and wellness. Ask your health care provider about:  The right schedule for you to have regular tests and exams.  Things you can do on your own to prevent diseases and keep yourself healthy. What should I know about diet, weight, and exercise? Eat a healthy diet   Eat a diet that includes plenty of vegetables, fruits, low-fat dairy products, and lean protein.  Do not eat a lot of foods that are high in solid fats, added sugars, or sodium. Maintain a healthy weight Body mass index (BMI) is used to identify weight problems. It estimates body fat based on height and weight. Your health care provider can help determine your BMI and help you achieve or maintain a healthy weight. Get regular exercise Get regular exercise. This is one of the most important things you can do for your health. Most adults should:  Exercise for at least 150 minutes each week. The exercise should increase your heart rate and make you sweat (moderate-intensity exercise).  Do strengthening exercises at least twice a week. This is in addition to the moderate-intensity exercise.  Spend less time sitting. Even light physical activity can be beneficial. Watch cholesterol and blood lipids Have your blood tested for lipids and cholesterol at 57 years of age, then have this test every 5 years. Have your cholesterol levels checked more often if:  Your lipid or cholesterol levels are high.  You are older than 57 years of age.  You are at high risk for heart disease. What should I know about cancer screening? Depending on your health history and family history, you may need to have cancer screening at various ages. This may include screening for:  Breast cancer.  Cervical cancer.  Colorectal cancer.  Skin  cancer.  Lung cancer. What should I know about heart disease, diabetes, and high blood pressure? Blood pressure and heart disease  High blood pressure causes heart disease and increases the risk of stroke. This is more likely to develop in people who have high blood pressure readings, are of African descent, or are overweight.  Have your blood pressure checked: ? Every 3-5 years if you are 64-6 years of age. ? Every year if you are 65 years old or older. Diabetes Have regular diabetes screenings. This checks your fasting blood sugar level. Have the screening done:  Once every three years after age 62 if you are at a normal weight and have a low risk for diabetes.  More often and at a younger age if you are overweight or have a high risk for diabetes. What should I know about preventing infection? Hepatitis B If you have a higher risk for hepatitis B, you should be screened for this virus. Talk with your health care provider to find out if you are at risk for hepatitis B infection. Hepatitis C Testing is recommended for:  Everyone born from [birth year] through 1964.  Anyone with known risk factors for hepatitis C. Sexually transmitted infections (STIs)  Get screened for STIs, including gonorrhea and chlamydia, if: ? You are sexually active and are younger than 57 years of age. ? You are older than 57 years of age and your health care provider tells you that you are at risk for this type of infection. ? Your sexual activity has changed since you were last  screened, and you are at increased risk for chlamydia or gonorrhea. Ask your health care provider if you are at risk.  Ask your health care provider about whether you are at high risk for HIV. Your health care provider may recommend a prescription medicine to help prevent HIV infection. If you choose to take medicine to prevent HIV, you should first get tested for HIV. You should then be tested every 3 months for as long as you are taking  the medicine. Pregnancy  If you are about to stop having your period (premenopausal) and you may become pregnant, seek counseling before you get pregnant.  Take 400 to 800 micrograms (mcg) of folic acid every day if you become pregnant.  Ask for birth control (contraception) if you want to prevent pregnancy. Osteoporosis and menopause Osteoporosis is a disease in which the bones lose minerals and strength with aging. This can result in bone fractures. If you are 73 years old or older, or if you are at risk for osteoporosis and fractures, ask your health care provider if you should:  Be screened for bone loss.  Take a calcium or vitamin D supplement to lower your risk of fractures.  Be given hormone replacement therapy (HRT) to treat symptoms of menopause. Follow these instructions at home: Lifestyle  Do not use any products that contain nicotine or tobacco, such as cigarettes, e-cigarettes, and chewing tobacco. If you need help quitting, ask your health care provider.  Do not use street drugs.  Do not share needles.  Ask your health care provider for help if you need support or information about quitting drugs. Alcohol use  Do not drink alcohol if: ? Your health care provider tells you not to drink. ? You are pregnant, may be pregnant, or are planning to become pregnant.  If you drink alcohol: ? Limit how much you use to 0-1 drink a day. ? Limit intake if you are breastfeeding.  Be aware of how much alcohol is in your drink. In the [place] one drink equals one 12 oz bottle of beer (355 mL), one 5 oz glass of wine (148 mL), or one 1 oz glass of hard liquor (44 mL). General instructions  Schedule regular health, dental, and eye exams.  Stay current with your vaccines.  Tell your health care provider if: ? You often feel depressed. ? You have ever been abused or do not feel safe at home. Summary  Adopting a healthy lifestyle and getting preventive care are important in  promoting health and wellness.  Follow your health care provider's instructions about healthy diet, exercising, and getting tested or screened for diseases.  Follow your health care provider's instructions on monitoring your cholesterol and blood pressure. This information is not intended to replace advice given to you by your health care provider. Make sure you discuss any questions you have with your health care provider. Document Released: 11/23/2010 Document Revised: 05/03/2018 Document Reviewed: 05/03/2018 Elsevier Patient Education  2020 [employer].

## 2018-12-09 ENCOUNTER — Encounter: Payer: Self-pay | Admitting: Family Medicine

## 2019-11-13 DIAGNOSIS — Z01419 Encounter for gynecological examination (general) (routine) without abnormal findings: Secondary | ICD-10-CM | POA: Diagnosis not present

## 2019-11-13 DIAGNOSIS — Z6835 Body mass index (BMI) 35.0-35.9, adult: Secondary | ICD-10-CM | POA: Diagnosis not present

## 2019-11-13 DIAGNOSIS — Z1231 Encounter for screening mammogram for malignant neoplasm of breast: Secondary | ICD-10-CM | POA: Diagnosis not present

## 2019-11-21 ENCOUNTER — Other Ambulatory Visit: Payer: Self-pay | Admitting: Family Medicine

## 2019-11-21 DIAGNOSIS — K219 Gastro-esophageal reflux disease without esophagitis: Secondary | ICD-10-CM

## 2019-12-17 ENCOUNTER — Other Ambulatory Visit: Payer: Self-pay | Admitting: Family Medicine

## 2019-12-17 DIAGNOSIS — E669 Obesity, unspecified: Secondary | ICD-10-CM

## 2019-12-17 DIAGNOSIS — R7303 Prediabetes: Secondary | ICD-10-CM

## 2019-12-17 DIAGNOSIS — E559 Vitamin D deficiency, unspecified: Secondary | ICD-10-CM

## 2019-12-19 ENCOUNTER — Other Ambulatory Visit: Payer: Self-pay

## 2019-12-19 ENCOUNTER — Other Ambulatory Visit (INDEPENDENT_AMBULATORY_CARE_PROVIDER_SITE_OTHER): Payer: BC Managed Care – PPO

## 2019-12-19 DIAGNOSIS — E559 Vitamin D deficiency, unspecified: Secondary | ICD-10-CM | POA: Diagnosis not present

## 2019-12-19 DIAGNOSIS — E669 Obesity, unspecified: Secondary | ICD-10-CM | POA: Diagnosis not present

## 2019-12-19 DIAGNOSIS — R7303 Prediabetes: Secondary | ICD-10-CM

## 2019-12-19 LAB — COMPREHENSIVE METABOLIC PANEL
ALT: 21 U/L (ref 0–35)
AST: 22 U/L (ref 0–37)
Albumin: 4.2 g/dL (ref 3.5–5.2)
Alkaline Phosphatase: 69 U/L (ref 39–117)
BUN: 20 mg/dL (ref 6–23)
CO2: 30 mEq/L (ref 19–32)
Calcium: 9.7 mg/dL (ref 8.4–10.5)
Chloride: 103 mEq/L (ref 96–112)
Creatinine, Ser: 0.89 mg/dL (ref 0.40–1.20)
GFR: 65.12 mL/min (ref >60.00)
Glucose, Bld: 110 mg/dL — ABNORMAL HIGH (ref 70–99)
Potassium: 4.3 mEq/L (ref 3.5–5.1)
Sodium: 138 mEq/L (ref 135–145)
Total Bilirubin: 0.5 mg/dL (ref 0.2–1.2)
Total Protein: 7.1 g/dL (ref 6.0–8.3)

## 2019-12-19 LAB — CBC WITH DIFFERENTIAL/PLATELET
Basophils Absolute: 0 10*3/uL (ref 0.0–0.1)
Basophils Relative: 0.7 % (ref 0.0–3.0)
Eosinophils Absolute: 0.2 10*3/uL (ref 0.0–0.7)
Eosinophils Relative: 3.7 % (ref 0.0–5.0)
HCT: 35.8 % — ABNORMAL LOW (ref 36.0–46.0)
Hemoglobin: 12 g/dL (ref 12.0–15.0)
Lymphocytes Relative: 28.6 % (ref 12.0–46.0)
Lymphs Abs: 1.2 10*3/uL (ref 0.7–4.0)
MCHC: 33.5 g/dL (ref 30.0–36.0)
MCV: 82.3 fl (ref 78.0–100.0)
Monocytes Absolute: 0.4 10*3/uL (ref 0.1–1.0)
Monocytes Relative: 8.2 % (ref 3.0–12.0)
Neutro Abs: 2.5 10*3/uL (ref 1.4–7.7)
Neutrophils Relative %: 58.8 % (ref 43.0–77.0)
Platelets: 233 10*3/uL (ref 150.0–400.0)
RBC: 4.36 Mil/uL (ref 3.87–5.11)
RDW: 14.6 % (ref 11.5–15.5)
WBC: 4.3 10*3/uL (ref 4.0–10.5)

## 2019-12-19 LAB — LIPID PANEL
Cholesterol: 164 mg/dL (ref 0–200)
HDL: 50.3 mg/dL (ref >39.00)
LDL Cholesterol: 92 mg/dL (ref 0–99)
NonHDL: 113.26
Total CHOL/HDL Ratio: 3
Triglycerides: 106 mg/dL (ref 0.0–149.0)
VLDL: 21.2 mg/dL (ref 0.0–40.0)

## 2019-12-19 LAB — HEMOGLOBIN A1C: Hgb A1c MFr Bld: 5.8 % (ref 4.6–6.5)

## 2019-12-19 LAB — VITAMIN D 25 HYDROXY (VIT D DEFICIENCY, FRACTURES): VITD: 42.83 ng/mL (ref 30.00–100.00)

## 2019-12-26 ENCOUNTER — Other Ambulatory Visit: Payer: Self-pay

## 2019-12-26 ENCOUNTER — Ambulatory Visit (INDEPENDENT_AMBULATORY_CARE_PROVIDER_SITE_OTHER): Payer: BC Managed Care – PPO | Admitting: Family Medicine

## 2019-12-26 ENCOUNTER — Encounter: Payer: Self-pay | Admitting: Family Medicine

## 2019-12-26 VITALS — BP 142/80 | HR 102 | Temp 97.2°F | Ht 65.0 in | Wt 222.0 lb

## 2019-12-26 DIAGNOSIS — R7303 Prediabetes: Secondary | ICD-10-CM

## 2019-12-26 DIAGNOSIS — M79644 Pain in right finger(s): Secondary | ICD-10-CM

## 2019-12-26 DIAGNOSIS — R252 Cramp and spasm: Secondary | ICD-10-CM | POA: Diagnosis not present

## 2019-12-26 DIAGNOSIS — E669 Obesity, unspecified: Secondary | ICD-10-CM

## 2019-12-26 DIAGNOSIS — Z0000 Encounter for general adult medical examination without abnormal findings: Secondary | ICD-10-CM | POA: Diagnosis not present

## 2019-12-26 DIAGNOSIS — L02412 Cutaneous abscess of left axilla: Secondary | ICD-10-CM | POA: Diagnosis not present

## 2019-12-26 DIAGNOSIS — G8929 Other chronic pain: Secondary | ICD-10-CM

## 2019-12-26 DIAGNOSIS — K219 Gastro-esophageal reflux disease without esophagitis: Secondary | ICD-10-CM

## 2019-12-26 NOTE — Patient Instructions (Signed)
Look at compression sleeve thumb support and can also see Dr. Patsy Johnson here in the office   Increase water to 80-90 ounces  You tube yoga- Yoga with Adrienne, stretch your legs before bedtime  Decrease your omeprazole to 10 mg daily for 3-4 weeks then try to go to every other day. Can move to as needed if tolerated.   Youtube- Dr. Shanda Johnson, Dr. Wylene Johnson   A resource that I like is www.dietdoctor.com/diabetes/diet  Here are some guidelines to help you with meal planning -  Avoid all processed and packaged foods (bread, pasta, crackers, chips, etc) and beverages containing calories.  Avoid added sugars and excessive natural sugars.  Attention to how you feel if you consume artificial sweeteners.  Do they make you more hungry or raise your blood sugar?  With every meal and snack, aim to get 20 g of protein (3 ounces of meat, 4 ounces of fish, 3 eggs, protein powder, 1 cup [place] yogurt, 1 cup cottage cheese, etc.)  Increase fiber in the form of non-starchy vegetables.  These help you feel full with very little carbohydrates and are good for gut health.  Eat 1 serving healthy carb per meal- 1/2 cup brown rice, beans, potato, corn- pay attention to whether or not this significantly raises your blood sugar. If it does, reduce the frequency you consume these.   Eat 2-3 servings of lower sugar fruits daily.  This includes berries, apples, oranges, peaches, pears, one half banana.  Have small amounts of good fats such as avocado, nuts, olive oil, nut butters, olives.  Add a little cheese to your salads to make them tasty.

## 2019-12-26 NOTE — Progress Notes (Signed)
Subjective:    Patient ID: Nicole Johnson, female    DOB: [birth year], 59 y.o.   MRN: [id number]  HPI Chief Complaint  Patient presents with  . Annual Exam  . Hand Pain    Right thumb x 6 weeks    This is a 59 year old female who presents today for annual exam.  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  Has overall been feeling well.  Last CPE- 11/2019 Mammo- annual at gyn, will get records Pap- 08/03/2017, negative, negative HPV Colonoscopy-09/10/2014, 10-year recall Tdap-11/2018 Flu-annual Eye- annual Dental-regular Exercise-has a very active job and has started to add in some stretching  Cramps in legs at night.   Prediabetes-patient had labs prior to visit today.  Hemoglobin A1c up to 5.8 from 5.5 last year. Eats out for breakfast. Biscuit with pork tenderloin.  The 10-year ASCVD risk score Denman George DC Montez Hageman., et al., 2013) is: 2.4%   Values used to calculate the score:     Age: 60 years     Sex: Female     Is Non-Hispanic African American: No     Diabetic: No     Tobacco smoker: No     Systolic Blood Pressure: 128 mmHg     Is BP treated: No     HDL Cholesterol: 50.3 mg/dL     Total Cholesterol: 164 mg/dL   GERD-has been on omeprazole 20 mg for a long time.   Review of Systems  Constitutional: Negative.   HENT: Negative.   Eyes: Negative.   Respiratory: Negative.   Cardiovascular: Negative.   Gastrointestinal: Negative.   Endocrine: Negative.   Genitourinary: Negative for dyspareunia, dysuria, frequency and pelvic pain.       Occasional stress incontinence with sneezing/coughing.  No nocturia.  Does not have to wear a pad.  Musculoskeletal:       Pain and swelling at base of right thumb.  Some locking of thumb joint.  Uses her hands a lot for work.  Skin: Negative.   Allergic/Immunologic: Negative.   Neurological: Negative.   Hematological: Positive for adenopathy (under left arm).  Psychiatric/Behavioral: Negative.         Objective:   Physical Exam Vitals reviewed.  Constitutional:      General: She is not in acute distress.    Appearance: Normal appearance. She is obese. She is not ill-appearing, toxic-appearing or diaphoretic.  HENT:     Head: Normocephalic and atraumatic.     Right Ear: Tympanic membrane, ear canal and external ear normal.     Left Ear: Tympanic membrane, ear canal and external ear normal.     Mouth/Throat:     Mouth: Mucous membranes are moist.     Pharynx: Oropharynx is clear.  Eyes:     Conjunctiva/sclera: Conjunctivae normal.  Cardiovascular:     Rate and Rhythm: Normal rate and regular rhythm.     Heart sounds: Normal heart sounds.  Pulmonary:     Effort: Pulmonary effort is normal.     Breath sounds: Normal breath sounds.  Chest:     Breasts:        Right: Normal.        Left: Normal.  Abdominal:     General: Abdomen is flat. Bowel sounds are normal. There is no distension.     Palpations: Abdomen is soft. There is no mass.     Tenderness: There is no abdominal tenderness. There is no guarding or rebound.  Hernia: No hernia is present.  Musculoskeletal:     Cervical back: Normal range of motion and neck supple.     Right lower leg: No edema.     Left lower leg: No edema.     Comments: Fullness of the right proximal thumb joint.  Good range of motion but with some DIP sticking.  Lymphadenopathy:     Upper Body:     Right upper body: No supraclavicular, axillary or pectoral adenopathy.     Left upper body: No supraclavicular, axillary or pectoral adenopathy.  Skin:    General: Skin is warm and dry.     Comments: Resolving abscess left axilla.  Slightly firm, nonfluctuant approximately 3 mm.  No lymphadenopathy.  Neurological:     Mental Status: She is alert and oriented to person, place, and time.  Psychiatric:        Mood and Affect: Mood normal.        Behavior: Behavior normal.        Thought Content: Thought content normal.        Judgment: Judgment  normal.       BP (!) 142/80   Pulse (!) 102   Temp (!) 97.2 F (36.2 C) (Temporal)   Ht 5\' 5"  (1.651 m)   Wt 222 lb (100.7 kg)   LMP 01/11/2011   SpO2 98%   BMI 36.94 kg/m  Wt Readings from Last 3 Encounters:  12/26/19 222 lb (100.7 kg)  12/08/18 215 lb (97.5 kg)  09/11/18 219 lb 8 oz (99.6 kg)   Depression screen [facility] 2/9 12/26/2019 12/08/2018 12/02/2017  Decreased Interest 0 0 0  Down, Depressed, Hopeless 0 0 0  PHQ - 2 Score 0 0 0       Assessment & Plan:  1. Annual physical exam - Discussed and encouraged healthy lifestyle choices- adequate sleep, regular exercise, stress management and healthy food choices.    2. Abscess of left axilla -Per patient, this is resolving.  It is very small and nonfluctuant today.  Advised her to start warm compresses if it reoccurs and follow-up.  3. Chronic pain of right thumb -Discussed over-the-counter analgesics as well as topical treatments.  Can wear compression bandage while working for comfort as needed.  Discussed timing trigger finger injection by Dr. 02/02/2018, sports medicine.  4. Leg cramps -She has very physical job with a lot of standing, labs were normal.  Encouraged her to increase her water intake and stretch before bed.  If no improvement with conservative measures, will check ferritin and magnesium at follow-up.  5. Prediabetes -Discussed diagnosis with patient -Recheck in 6 months  6. Obesity (BMI 30.0-34.9) -Reviewed her typical diet and discussed decreasing carbohydrates and shortening eating window.  Provided written and verbal information regarding healthy, balanced diet.  7. Gastroesophageal reflux disease, unspecified whether esophagitis present -Has been on omeprazole 20 mg for a long time.  Symptoms not daily.  Discussed weaning by starting 10 mg daily for several weeks then decrease to every other day.  This visit occurred during the SARS-CoV-2 public health emergency.  Safety protocols were in place,  including screening questions prior to the visit, additional usage of staff PPE, and extensive cleaning of exam room while observing appropriate contact time as indicated for disinfecting solutions.      09-25-1992, FNP-BC  [facility], [facility]  12/26/2019 5:00 PM

## 2020-01-02 ENCOUNTER — Other Ambulatory Visit: Payer: Self-pay

## 2020-01-02 ENCOUNTER — Encounter: Payer: Self-pay | Admitting: Family Medicine

## 2020-01-02 ENCOUNTER — Ambulatory Visit (INDEPENDENT_AMBULATORY_CARE_PROVIDER_SITE_OTHER): Payer: BC Managed Care – PPO | Admitting: Family Medicine

## 2020-01-02 VITALS — BP 110/64 | HR 91 | Temp 97.7°F | Ht 65.0 in | Wt 222.0 lb

## 2020-01-02 DIAGNOSIS — M65311 Trigger thumb, right thumb: Secondary | ICD-10-CM

## 2020-01-02 MED ORDER — METHYLPREDNISOLONE ACETATE 40 MG/ML IJ SUSP
40.0000 mg | Freq: Once | INTRAMUSCULAR | Status: AC
  Administered 2020-01-02: 20 mg via INTRAMUSCULAR

## 2020-01-02 NOTE — Addendum Note (Signed)
Addended by: Eual Fines on: 01/02/2020 02:26 PM   Modules accepted: Orders

## 2020-01-02 NOTE — Progress Notes (Signed)
    Berthe Oley T. Alberto Schoch, MD, CAQ Sports Medicine  Primary Care and Sports Medicine [facility] at [facility] [address] [place], [address]  Phone: [phone]  FAX: [phone]  Nicole Johnson - 59 y.o. female  MRN [id number]  Date of Birth: 07/08/1960  Date: 01/02/2020  PCP: Emi Belfast, FNP  Referral: Emi Belfast, FNP  Chief Complaint  Patient presents with  . Right Thumb Catch    Debbie suggested she see Dr Patsy Lager. Pain is better. Swelling not better.    This visit occurred during the SARS-CoV-2 public health emergency.  Safety protocols were in place, including screening questions prior to the visit, additional usage of staff PPE, and extensive cleaning of exam room while observing appropriate contact time as indicated for disinfecting solutions.   Subjective:   Nicole Johnson is a 59 y.o. very pleasant female patient with Body mass index is 36.94 kg/m. who presents with the following:  R 1st digit trigger thumb:  Known classical appearance of right-sided trigger thumb, referred by Mrs. Leone Payor.  Known diagnosis, procedure only.  Tendon Sheath Injection Procedure Note Nicole Johnson August 29, 1960 Date of procedure: 01/02/2020  Procedure: Tendon Sheath Injection for Trigger Finger, R 1st Indications: Pain  Procedure Details Verbal consent was obtained. Risks (including rare risk of infection, potential risk for skin lightening and potential atrophy), benefits and alternatives were discussed. Prepped with Chloraprep and Ethyl Chloride used for anesthesia. Under sterile conditions, patient injected at palmar crease aiming distally with 45 degree angle towards nodule; injected directly into tendon sheath. Medication flowed freely without resistance.  Needle size: 22 gauge 1 1/2 inch Injection: 1/2 cc of Lidocaine 1% and Depo-Medrol 20 mg Medication: Depo-Medrol 20 mg   Signed,  Emaly Boschert T. Mykah Shin, MD

## 2020-02-05 ENCOUNTER — Encounter: Payer: Self-pay | Admitting: Family Medicine

## 2020-04-03 ENCOUNTER — Ambulatory Visit (INDEPENDENT_AMBULATORY_CARE_PROVIDER_SITE_OTHER): Payer: BC Managed Care – PPO

## 2020-04-03 DIAGNOSIS — Z23 Encounter for immunization: Secondary | ICD-10-CM | POA: Diagnosis not present

## 2020-05-10 DIAGNOSIS — Z03818 Encounter for observation for suspected exposure to other biological agents ruled out: Secondary | ICD-10-CM | POA: Diagnosis not present

## 2020-05-10 DIAGNOSIS — Z20822 Contact with and (suspected) exposure to covid-19: Secondary | ICD-10-CM | POA: Diagnosis not present

## 2020-05-11 ENCOUNTER — Encounter: Payer: Self-pay | Admitting: Family Medicine

## 2020-07-04 ENCOUNTER — Ambulatory Visit: Payer: BC Managed Care – PPO | Admitting: Family Medicine

## 2020-07-10 ENCOUNTER — Encounter: Payer: Self-pay | Admitting: Family Medicine

## 2020-07-10 ENCOUNTER — Ambulatory Visit (INDEPENDENT_AMBULATORY_CARE_PROVIDER_SITE_OTHER): Payer: BC Managed Care – PPO | Admitting: Family Medicine

## 2020-07-10 ENCOUNTER — Other Ambulatory Visit: Payer: Self-pay

## 2020-07-10 VITALS — BP 128/72 | HR 84 | Temp 96.9°F | Ht 65.0 in | Wt 218.0 lb

## 2020-07-10 DIAGNOSIS — E669 Obesity, unspecified: Secondary | ICD-10-CM

## 2020-07-10 DIAGNOSIS — Z79899 Other long term (current) drug therapy: Secondary | ICD-10-CM

## 2020-07-10 DIAGNOSIS — K219 Gastro-esophageal reflux disease without esophagitis: Secondary | ICD-10-CM

## 2020-07-10 DIAGNOSIS — R252 Cramp and spasm: Secondary | ICD-10-CM | POA: Diagnosis not present

## 2020-07-10 DIAGNOSIS — R7303 Prediabetes: Secondary | ICD-10-CM

## 2020-07-10 DIAGNOSIS — E538 Deficiency of other specified B group vitamins: Secondary | ICD-10-CM | POA: Insufficient documentation

## 2020-07-10 LAB — BASIC METABOLIC PANEL
BUN: 20 mg/dL (ref 6–23)
CO2: 30 mEq/L (ref 19–32)
Calcium: 9.8 mg/dL (ref 8.4–10.5)
Chloride: 102 mEq/L (ref 96–112)
Creatinine, Ser: 0.82 mg/dL (ref 0.40–1.20)
GFR: 78.71 mL/min (ref >60.00)
Glucose, Bld: 85 mg/dL (ref 70–99)
Potassium: 4.8 mEq/L (ref 3.5–5.1)
Sodium: 135 mEq/L (ref 135–145)

## 2020-07-10 LAB — MAGNESIUM: Magnesium: 1.9 mg/dL (ref 1.5–2.5)

## 2020-07-10 LAB — HEMOGLOBIN A1C: Hgb A1c MFr Bld: 5.7 % (ref 4.6–6.5)

## 2020-07-10 LAB — VITAMIN B12: Vitamin B-12: 151 pg/mL — ABNORMAL LOW (ref 211–911)

## 2020-07-10 NOTE — Assessment & Plan Note (Signed)
Discussed how this problem influences overall health and the risks it imposes  Reviewed plan for weight loss with lower calorie diet (via better food choices and also portion control or program like weight watchers) and exercise building up to or more than 30 minutes 5 days per week including some aerobic activity   Enc her to get more of her carbs from produce

## 2020-07-10 NOTE — Assessment & Plan Note (Signed)
BMet and mag and B12 added to labs Discussed possible reduced abs of these with ppi

## 2020-07-10 NOTE — Progress Notes (Signed)
Subjective:    Patient ID: Nicole Johnson, female    DOB: 11/04/1960, 60 y.o.   MRN: [id number]  This visit occurred during the SARS-CoV-2 public health emergency.  Safety protocols were in place, including screening questions prior to the visit, additional usage of staff PPE, and extensive cleaning of exam room while observing appropriate contact time as indicated for disinfecting solutions.    HPI 60 yo pt of NP Leone Payor presents for f/u of chronic medical problems  Wt Readings from Last 3 Encounters:  07/10/20 218 lb (98.9 kg)  01/02/20 222 lb (100.7 kg)  12/26/19 222 lb (100.7 kg)   36.28 kg/m  Doing well lately  She works - has not slowed down (works in [employer])   She does stretching in the morning for exercise  On /off feet during work  Working on her diet   Cut out most of her bread  Subs carrots/ peppers and veggies - Has tried to eat less processed carbs   Husband eats what he wants- hard on her  Tries not to eat late  Cut out most eating out     BP Readings from Last 3 Encounters:  07/10/20 128/72  01/02/20 110/64  12/26/19 (!) 142/80   Pulse Readings from Last 3 Encounters:  07/10/20 84  01/02/20 91  12/26/19 (!) 102    H/o GERD takes 10 mg of omeprazole daily  She tried to get off of it  This keeps her controlled  Drinking vinegar also    Lab Results  Component Value Date   CREATININE 0.89 12/19/2019   BUN 20 12/19/2019   NA 138 12/19/2019   K 4.3 12/19/2019   CL 103 12/19/2019   CO2 30 12/19/2019   Vit D 42.8  Interested in checking B12   Has leg cramps  Wants to check K   Prediabetes Lab Results  Component Value Date   HGBA1C 5.8 12/19/2019   Previous provider wanted to re check this in 6 mo Diabetes runs in family - 5 sibs with diabetes    [facility] Mammogram 6/20 Self breast exam   Colonoscopy 4/16  utd imms Zoster status  covid immunized  Patient Active Problem List   Diagnosis Date Noted  . Muscle cramps  07/10/2020  . Current use of proton pump inhibitor 07/10/2020  . Prediabetes 12/26/2019  . Gastroesophageal reflux disease 12/02/2017  . Obesity with body mass index 30 or greater 07/26/2016  . Stress incontinence of urine 07/26/2016   Past Medical History:  Diagnosis Date  . Arthritis   . GERD (gastroesophageal reflux disease)    History reviewed. No pertinent surgical history. Social History   Tobacco Use  . Smoking status: Never Smoker  . Smokeless tobacco: Never Used  Substance Use Topics  . Alcohol use: Yes    Comment: occ  . Drug use: Never   Family History  Problem Relation Age of Onset  . Diabetes Mother   . Stroke Mother   . Cancer Father   . Diabetes Father   . Diabetes Sister   . Diabetes Brother   . Heart disease Maternal Grandmother   . Cancer Maternal Grandfather   . Heart disease Maternal Grandfather    Allergies  Allergen Reactions  . Meloxicam Nausea Only  . Tramadol Nausea Only   Current Outpatient Medications on File Prior to Visit  Medication Sig Dispense Refill  . Calcium Citrate-Vitamin D (CALCITRATE/VITAMIN D PO) Calcitrate-Vitamin D    . MAGNESIUM PO Take 1  tablet by mouth daily.    . multivitamin-lutein (OCUVITE-LUTEIN) CAPS capsule Take 1 capsule by mouth daily.    Marland Kitchen omeprazole (PRILOSEC) 20 MG capsule TAKE 1 CAPSULE(20 MG) BY MOUTH DAILY (Patient taking differently: Take 10 mg by mouth daily.) 90 capsule 1  . Pyridoxine HCl (VITAMIN B6 PO) Vitamin B6    . TURMERIC PO Take 2 capsules by mouth daily.    . Chlorphen-Pseudoephed-APAP (TYLENOL ALLERGY SINUS PO) Take by mouth as needed.  (Patient not taking: Reported on 07/10/2020)    . Loratadine (ALAVERT PO) Take by mouth as needed.  (Patient not taking: Reported on 07/10/2020)     No current facility-administered medications on file prior to visit.    Review of Systems  Constitutional: Negative for activity change, appetite change, fatigue, fever and unexpected weight change.  HENT:  Negative for congestion, ear pain, rhinorrhea, sinus pressure and sore throat.   Eyes: Negative for pain, redness and visual disturbance.  Respiratory: Negative for cough, shortness of breath and wheezing.   Cardiovascular: Negative for chest pain and palpitations.  Gastrointestinal: Negative for abdominal pain, blood in stool, constipation and diarrhea.  Endocrine: Negative for polydipsia and polyuria.  Genitourinary: Negative for dysuria, frequency and urgency.  Musculoskeletal: Negative for arthralgias, back pain and myalgias.  Skin: Negative for pallor and rash.  Allergic/Immunologic: Negative for environmental allergies.  Neurological: Negative for dizziness, syncope and headaches.  Hematological: Negative for adenopathy. Does not bruise/bleed easily.  Psychiatric/Behavioral: Negative for decreased concentration and dysphoric mood. The patient is not nervous/anxious.        Objective:   Physical Exam Constitutional:      General: She is not in acute distress.    Appearance: Normal appearance. She is well-developed and well-nourished. She is obese. She is not ill-appearing.  HENT:     Head: Normocephalic and atraumatic.     Mouth/Throat:     Mouth: Oropharynx is clear and moist. Mucous membranes are moist.  Eyes:     General: No scleral icterus.    Extraocular Movements: EOM normal.     Conjunctiva/sclera: Conjunctivae normal.     Pupils: Pupils are equal, round, and reactive to light.  Neck:     Thyroid: No thyromegaly.     Vascular: No carotid bruit or JVD.  Cardiovascular:     Rate and Rhythm: Normal rate and regular rhythm.     Pulses: Intact distal pulses.     Heart sounds: Normal heart sounds. No gallop.   Pulmonary:     Effort: Pulmonary effort is normal. No respiratory distress.     Breath sounds: Normal breath sounds. No wheezing or rales.     Comments: No crackles Abdominal:     General: Bowel sounds are normal. There is no distension or abdominal bruit.      Palpations: Abdomen is soft. There is no mass.     Tenderness: There is no abdominal tenderness.  Musculoskeletal:        General: No edema.     Cervical back: Normal range of motion and neck supple.     Right lower leg: No edema.     Left lower leg: No edema.  Lymphadenopathy:     Cervical: No cervical adenopathy.  Skin:    General: Skin is warm and dry.     Coloration: Skin is not pale.     Findings: No erythema or rash.  Neurological:     Mental Status: She is alert.     Sensory: No sensory deficit.  Coordination: Coordination normal.     Deep Tendon Reflexes: Reflexes are normal and symmetric. Reflexes normal.  Psychiatric:        Mood and Affect: Mood and affect and mood normal.           Assessment & Plan:   Problem List Items Addressed This Visit      Digestive   Gastroesophageal reflux disease    Controlled well with omeprazole 10 mg  Has tried to stop but had much rebound symptoms B12 and magnesium levels added to labs in light of ppi        Other   Obesity with body mass index 30 or greater    Discussed how this problem influences overall health and the risks it imposes  Reviewed plan for weight loss with lower calorie diet (via better food choices and also portion control or program like weight watchers) and exercise building up to or more than 30 minutes 5 days per week including some aerobic activity   Enc her to get more of her carbs from produce      Prediabetes - Primary    A1C done toda  disc imp of low glycemic diet and wt loss to prevent DM2  Pt has done well with diet change so far Strong family h/o DM      Relevant Orders   Hemoglobin A1c (Completed)   Muscle cramps    Magnesium level added to labs She takes it otc and it has helped      Relevant Orders   Basic metabolic panel (Completed)   Magnesium (Completed)   Current use of proton pump inhibitor    BMet and mag and B12 added to labs Discussed possible reduced abs of these  with ppi      Relevant Orders   Magnesium (Completed)   Vitamin B12 (Completed)

## 2020-07-10 NOTE — Patient Instructions (Addendum)
Continue making gradual lifestyle changes  Add small amounts of exercise also   Labs today   Take care of yourself  Good job so far

## 2020-07-10 NOTE — Assessment & Plan Note (Signed)
Magnesium level added to labs She takes it otc and it has helped

## 2020-07-10 NOTE — Assessment & Plan Note (Signed)
A1C done toda  disc imp of low glycemic diet and wt loss to prevent DM2  Pt has done well with diet change so far Strong family h/o DM

## 2020-07-10 NOTE — Assessment & Plan Note (Signed)
Controlled well with omeprazole 10 mg  Has tried to stop but had much rebound symptoms B12 and magnesium levels added to labs in light of ppi

## 2020-07-17 ENCOUNTER — Other Ambulatory Visit: Payer: Self-pay

## 2020-07-17 ENCOUNTER — Ambulatory Visit (INDEPENDENT_AMBULATORY_CARE_PROVIDER_SITE_OTHER): Payer: BC Managed Care – PPO

## 2020-07-17 DIAGNOSIS — E538 Deficiency of other specified B group vitamins: Secondary | ICD-10-CM

## 2020-07-17 MED ORDER — CYANOCOBALAMIN 1000 MCG/ML IJ SOLN
1000.0000 ug | Freq: Once | INTRAMUSCULAR | Status: AC
Start: 2020-07-17 — End: 2020-07-17
  Administered 2020-07-17: 1000 ug via INTRAMUSCULAR

## 2020-07-17 NOTE — Progress Notes (Signed)
Per orders of Dr. Tower, injection of vit B12 given by Zani Kyllonen. Patient tolerated injection well.  

## 2020-07-23 ENCOUNTER — Ambulatory Visit: Payer: BC Managed Care – PPO

## 2020-07-29 ENCOUNTER — Other Ambulatory Visit: Payer: Self-pay

## 2020-07-29 ENCOUNTER — Ambulatory Visit (INDEPENDENT_AMBULATORY_CARE_PROVIDER_SITE_OTHER): Payer: BC Managed Care – PPO

## 2020-07-29 DIAGNOSIS — E538 Deficiency of other specified B group vitamins: Secondary | ICD-10-CM

## 2020-07-29 MED ORDER — CYANOCOBALAMIN 1000 MCG/ML IJ SOLN
1000.0000 ug | Freq: Once | INTRAMUSCULAR | Status: AC
  Administered 2020-07-29: 1000 ug via INTRAMUSCULAR

## 2020-07-29 NOTE — Progress Notes (Signed)
Patient presented for B 12 injection given by Jessica Isley, CMA to right deltoid, patient voiced no concerns nor showed any signs of distress during injection.  

## 2020-08-05 ENCOUNTER — Other Ambulatory Visit: Payer: Self-pay

## 2020-08-05 ENCOUNTER — Ambulatory Visit (INDEPENDENT_AMBULATORY_CARE_PROVIDER_SITE_OTHER): Payer: BC Managed Care – PPO | Admitting: *Deleted

## 2020-08-05 DIAGNOSIS — E538 Deficiency of other specified B group vitamins: Secondary | ICD-10-CM | POA: Diagnosis not present

## 2020-08-05 MED ORDER — CYANOCOBALAMIN 1000 MCG/ML IJ SOLN
1000.0000 ug | Freq: Once | INTRAMUSCULAR | Status: AC
  Administered 2020-08-05: 1000 ug via INTRAMUSCULAR

## 2020-08-05 NOTE — Progress Notes (Signed)
Per orders of Dr. Gutierrez, injection of B12 given by [place], Shapale M. Patient tolerated injection well.   PCP out of the office. 

## 2020-08-13 ENCOUNTER — Other Ambulatory Visit: Payer: Self-pay

## 2020-08-13 ENCOUNTER — Ambulatory Visit (INDEPENDENT_AMBULATORY_CARE_PROVIDER_SITE_OTHER): Payer: BC Managed Care – PPO

## 2020-08-13 DIAGNOSIS — E538 Deficiency of other specified B group vitamins: Secondary | ICD-10-CM

## 2020-08-13 MED ORDER — CYANOCOBALAMIN 1000 MCG/ML IJ SOLN
1000.0000 ug | Freq: Once | INTRAMUSCULAR | Status: AC
  Administered 2020-08-13: 1000 ug via INTRAMUSCULAR

## 2020-08-13 NOTE — Progress Notes (Signed)
Per orders of Dr. Sharen Hones, in Dr. Royden Purl absence, last weekly injection of B12 given, in R arm, by Erby Pian. Patient tolerated injection well. Pt will schedule her monthly injection on the way out.

## 2020-09-17 ENCOUNTER — Ambulatory Visit (INDEPENDENT_AMBULATORY_CARE_PROVIDER_SITE_OTHER): Payer: BC Managed Care – PPO

## 2020-09-17 ENCOUNTER — Other Ambulatory Visit: Payer: Self-pay

## 2020-09-17 DIAGNOSIS — E538 Deficiency of other specified B group vitamins: Secondary | ICD-10-CM

## 2020-09-17 MED ORDER — CYANOCOBALAMIN 1000 MCG/ML IJ SOLN
1000.0000 ug | Freq: Once | INTRAMUSCULAR | Status: AC
  Administered 2020-09-17: 1000 ug via INTRAMUSCULAR

## 2020-09-17 NOTE — Progress Notes (Signed)
Patient presented for B 12 injection given by Ayham Word, CMA to right deltoid, patient voiced no concerns nor showed any signs of distress during injection.  

## 2020-10-21 ENCOUNTER — Ambulatory Visit: Payer: BC Managed Care – PPO

## 2020-10-29 ENCOUNTER — Ambulatory Visit: Payer: BC Managed Care – PPO

## 2020-11-04 ENCOUNTER — Other Ambulatory Visit: Payer: Self-pay

## 2020-11-04 ENCOUNTER — Ambulatory Visit (INDEPENDENT_AMBULATORY_CARE_PROVIDER_SITE_OTHER): Payer: BC Managed Care – PPO

## 2020-11-04 DIAGNOSIS — E538 Deficiency of other specified B group vitamins: Secondary | ICD-10-CM | POA: Diagnosis not present

## 2020-11-04 MED ORDER — CYANOCOBALAMIN 1000 MCG/ML IJ SOLN
1000.0000 ug | Freq: Once | INTRAMUSCULAR | Status: AC
  Administered 2020-11-04: 1000 ug via INTRAMUSCULAR

## 2020-11-04 NOTE — Progress Notes (Signed)
Patient presented for B 12 injection given by Melisssa Donner, CMA to left deltoid, patient voiced no concerns nor showed any signs of distress during injection.  

## 2020-11-07 ENCOUNTER — Encounter: Payer: BC Managed Care – PPO | Admitting: Family Medicine

## 2020-11-24 ENCOUNTER — Telehealth: Payer: Self-pay | Admitting: Family Medicine

## 2020-11-24 DIAGNOSIS — E538 Deficiency of other specified B group vitamins: Secondary | ICD-10-CM

## 2020-11-24 NOTE — Telephone Encounter (Signed)
-----   Message from Aquilla Solian, RT sent at 11/10/2020 12:49 PM EDT ----- Regarding: Lab Orders for Tuesday 7.5.2022 Please place lab orders for Tuesday 7.5.2022, appt notes state "B12 check" Thank you, Jones Bales RT(R)

## 2020-11-25 ENCOUNTER — Other Ambulatory Visit (INDEPENDENT_AMBULATORY_CARE_PROVIDER_SITE_OTHER): Payer: BC Managed Care – PPO

## 2020-11-25 ENCOUNTER — Other Ambulatory Visit: Payer: Self-pay

## 2020-11-25 DIAGNOSIS — E538 Deficiency of other specified B group vitamins: Secondary | ICD-10-CM

## 2020-11-25 LAB — VITAMIN B12: Vitamin B-12: 880 pg/mL (ref 211–911)

## 2020-12-09 ENCOUNTER — Ambulatory Visit: Payer: BC Managed Care – PPO

## 2020-12-16 DIAGNOSIS — Z6835 Body mass index (BMI) 35.0-35.9, adult: Secondary | ICD-10-CM | POA: Diagnosis not present

## 2020-12-16 DIAGNOSIS — Z1231 Encounter for screening mammogram for malignant neoplasm of breast: Secondary | ICD-10-CM | POA: Diagnosis not present

## 2020-12-16 DIAGNOSIS — Z01419 Encounter for gynecological examination (general) (routine) without abnormal findings: Secondary | ICD-10-CM | POA: Diagnosis not present

## 2020-12-18 ENCOUNTER — Other Ambulatory Visit: Payer: Self-pay | Admitting: Obstetrics and Gynecology

## 2020-12-18 DIAGNOSIS — R928 Other abnormal and inconclusive findings on diagnostic imaging of breast: Secondary | ICD-10-CM

## 2021-01-01 ENCOUNTER — Telehealth: Payer: Self-pay | Admitting: Family Medicine

## 2021-01-01 DIAGNOSIS — E538 Deficiency of other specified B group vitamins: Secondary | ICD-10-CM

## 2021-01-01 DIAGNOSIS — Z0000 Encounter for general adult medical examination without abnormal findings: Secondary | ICD-10-CM

## 2021-01-01 DIAGNOSIS — Z79899 Other long term (current) drug therapy: Secondary | ICD-10-CM

## 2021-01-01 DIAGNOSIS — R7303 Prediabetes: Secondary | ICD-10-CM

## 2021-01-01 NOTE — Telephone Encounter (Signed)
-----   Message from Alvina Chou sent at 12/15/2020  9:32 AM EDT ----- Regarding: Lab orders for Friday, 8.12.22 Patient is scheduled for CPX labs, please order future labs, Thanks , Camelia Eng

## 2021-01-02 ENCOUNTER — Other Ambulatory Visit: Payer: Self-pay

## 2021-01-02 ENCOUNTER — Other Ambulatory Visit (INDEPENDENT_AMBULATORY_CARE_PROVIDER_SITE_OTHER): Payer: BC Managed Care – PPO

## 2021-01-02 DIAGNOSIS — E538 Deficiency of other specified B group vitamins: Secondary | ICD-10-CM

## 2021-01-02 DIAGNOSIS — Z0000 Encounter for general adult medical examination without abnormal findings: Secondary | ICD-10-CM

## 2021-01-02 DIAGNOSIS — R7303 Prediabetes: Secondary | ICD-10-CM | POA: Diagnosis not present

## 2021-01-02 DIAGNOSIS — Z79899 Other long term (current) drug therapy: Secondary | ICD-10-CM

## 2021-01-02 LAB — CBC WITH DIFFERENTIAL/PLATELET
Basophils Absolute: 0 10*3/uL (ref 0.0–0.1)
Basophils Relative: 0.7 % (ref 0.0–3.0)
Eosinophils Absolute: 0.2 10*3/uL (ref 0.0–0.7)
Eosinophils Relative: 4.3 % (ref 0.0–5.0)
HCT: 34.4 % — ABNORMAL LOW (ref 36.0–46.0)
Hemoglobin: 11.3 g/dL — ABNORMAL LOW (ref 12.0–15.0)
Lymphocytes Relative: 27.3 % (ref 12.0–46.0)
Lymphs Abs: 1 10*3/uL (ref 0.7–4.0)
MCHC: 32.9 g/dL (ref 30.0–36.0)
MCV: 82.2 fl (ref 78.0–100.0)
Monocytes Absolute: 0.3 10*3/uL (ref 0.1–1.0)
Monocytes Relative: 7.5 % (ref 3.0–12.0)
Neutro Abs: 2.3 10*3/uL (ref 1.4–7.7)
Neutrophils Relative %: 60.2 % (ref 43.0–77.0)
Platelets: 209 10*3/uL (ref 150.0–400.0)
RBC: 4.19 Mil/uL (ref 3.87–5.11)
RDW: 14.9 % (ref 11.5–15.5)
WBC: 3.7 10*3/uL — ABNORMAL LOW (ref 4.0–10.5)

## 2021-01-02 LAB — COMPREHENSIVE METABOLIC PANEL
ALT: 20 U/L (ref 0–35)
AST: 21 U/L (ref 0–37)
Albumin: 4.1 g/dL (ref 3.5–5.2)
Alkaline Phosphatase: 74 U/L (ref 39–117)
BUN: 19 mg/dL (ref 6–23)
CO2: 29 mEq/L (ref 19–32)
Calcium: 9.5 mg/dL (ref 8.4–10.5)
Chloride: 101 mEq/L (ref 96–112)
Creatinine, Ser: 0.8 mg/dL (ref 0.40–1.20)
GFR: 80.8 mL/min (ref >60.00)
Glucose, Bld: 94 mg/dL (ref 70–99)
Potassium: 4.5 mEq/L (ref 3.5–5.1)
Sodium: 137 mEq/L (ref 135–145)
Total Bilirubin: 0.4 mg/dL (ref 0.2–1.2)
Total Protein: 6.5 g/dL (ref 6.0–8.3)

## 2021-01-02 LAB — VITAMIN B12: Vitamin B-12: 869 pg/mL (ref 211–911)

## 2021-01-02 LAB — LIPID PANEL
Cholesterol: 171 mg/dL (ref 0–200)
HDL: 52.5 mg/dL (ref >39.00)
LDL Cholesterol: 91 mg/dL (ref 0–99)
NonHDL: 118.98
Total CHOL/HDL Ratio: 3
Triglycerides: 138 mg/dL (ref 0.0–149.0)
VLDL: 27.6 mg/dL (ref 0.0–40.0)

## 2021-01-02 LAB — HEMOGLOBIN A1C: Hgb A1c MFr Bld: 5.8 % (ref 4.6–6.5)

## 2021-01-02 LAB — VITAMIN D 25 HYDROXY (VIT D DEFICIENCY, FRACTURES): VITD: 39.52 ng/mL (ref 30.00–100.00)

## 2021-01-02 LAB — TSH: TSH: 1.5 u[IU]/mL (ref 0.35–5.50)

## 2021-01-05 ENCOUNTER — Other Ambulatory Visit: Payer: Self-pay | Admitting: Obstetrics and Gynecology

## 2021-01-05 ENCOUNTER — Ambulatory Visit
Admission: RE | Admit: 2021-01-05 | Discharge: 2021-01-05 | Disposition: A | Discharge status: Home / Self Care | Payer: BC Managed Care – PPO | Source: Ambulatory Visit | Attending: Obstetrics and Gynecology | Admitting: Obstetrics and Gynecology

## 2021-01-05 ENCOUNTER — Other Ambulatory Visit: Payer: Self-pay

## 2021-01-05 DIAGNOSIS — R928 Other abnormal and inconclusive findings on diagnostic imaging of breast: Secondary | ICD-10-CM

## 2021-01-05 DIAGNOSIS — N6310 Unspecified lump in the right breast, unspecified quadrant: Secondary | ICD-10-CM

## 2021-01-05 DIAGNOSIS — R922 Inconclusive mammogram: Secondary | ICD-10-CM | POA: Diagnosis not present

## 2021-01-09 ENCOUNTER — Encounter: Payer: BC Managed Care – PPO | Admitting: Family Medicine

## 2021-02-10 ENCOUNTER — Other Ambulatory Visit: Payer: Self-pay

## 2021-02-10 ENCOUNTER — Encounter: Payer: Self-pay | Admitting: Family Medicine

## 2021-02-10 ENCOUNTER — Ambulatory Visit (INDEPENDENT_AMBULATORY_CARE_PROVIDER_SITE_OTHER): Payer: BC Managed Care – PPO | Admitting: Family Medicine

## 2021-02-10 VITALS — BP 126/72 | HR 101 | Temp 97.7°F | Ht 65.0 in | Wt 221.2 lb

## 2021-02-10 DIAGNOSIS — E669 Obesity, unspecified: Secondary | ICD-10-CM

## 2021-02-10 DIAGNOSIS — E538 Deficiency of other specified B group vitamins: Secondary | ICD-10-CM

## 2021-02-10 DIAGNOSIS — Z23 Encounter for immunization: Secondary | ICD-10-CM | POA: Diagnosis not present

## 2021-02-10 DIAGNOSIS — D649 Anemia, unspecified: Secondary | ICD-10-CM

## 2021-02-10 DIAGNOSIS — Z0000 Encounter for general adult medical examination without abnormal findings: Secondary | ICD-10-CM | POA: Diagnosis not present

## 2021-02-10 DIAGNOSIS — R7303 Prediabetes: Secondary | ICD-10-CM

## 2021-02-10 NOTE — Assessment & Plan Note (Signed)
Discussed how this problem influences overall health and the risks it imposes  Reviewed plan for weight loss with lower calorie diet (via better food choices and also portion control or program like weight watchers) and exercise building up to or more than 30 minutes 5 days per week including some aerobic activity   Enc pt to keep working at it -now back on track with diet  Plan to add more walking for exercise

## 2021-02-10 NOTE — Progress Notes (Signed)
Subjective:    Patient ID: Nicole Johnson, female    DOB: March 17, 1961, 60 y.o.   MRN: [id number]  This visit occurred during the SARS-CoV-2 public health emergency.  Safety protocols were in place, including screening questions prior to the visit, additional usage of staff PPE, and extensive cleaning of exam room while observing appropriate contact time as indicated for disinfecting solutions.   HPI Here for health maintenance exam and to review chronic medical problems   Wt Readings from Last 3 Encounters:  02/10/21 221 lb 4 oz (100.4 kg)  07/10/20 218 lb (98.9 kg)  01/02/20 222 lb (100.7 kg)   36.82 kg/m  Doing ok  Taking care of herself   Diet - eating healthier in general (off track for a short time) Had cut out a lot of her bread  Slack on exercise with heat-just stretches  When it cools off she likes to walk    Zoster status - had shingrix  Covid immunized- had vaccine last week (did great) Flu shot - today  Tetanus  7/20 Tdap   Pap 3/19  neg at gyn with neg HPV Had visit in July /aug (no pap this year)  She would like to switch to [facility]   Mammogram 01/05/21 -had a repeat that was nl and needs re check in 6 months  Self breast exam -no lumps  Sister has new breast cancer in treatment (DCIS)  Father-lung cancer (he was a smoker) Brother- had cancer in his neck (what type)    Colonoscopy 4/16 10 y recall   BP Readings from Last 3 Encounters:  02/10/21 126/72  07/10/20 128/72  01/02/20 110/64   Pulse Readings from Last 3 Encounters:  02/10/21 (!) 101  07/10/20 84  01/02/20 91     Prediabetes Lab Results  Component Value Date   HGBA1C 5.8 01/02/2021  Stable  She stopped sugar in tea and cut way back on sweets  Strong family h/o DM   B12 deficiency Lab Results  Component Value Date   VITAMINB12 869 01/02/2021   Takes omeprazole 10 mg daily  B12 1000 mcg daily  Had 4 B12 shots weekly then stopped  Would like to not take shots   Has  labs in October    Vit D level is 39.52 Takes ca and D     Mild anemia Lab Results  Component Value Date   WBC 3.7 (L) 01/02/2021   HGB 11.3 (L) 01/02/2021   HCT 34.4 (L) 01/02/2021   MCV 82.2 01/02/2021   PLT 209.0 01/02/2021  She was donating blood  Not donating since B12   Lab Results  Component Value Date   CREATININE 0.80 01/02/2021   BUN 19 01/02/2021   NA 137 01/02/2021   K 4.5 01/02/2021   CL 101 01/02/2021   CO2 29 01/02/2021   Lab Results  Component Value Date   ALT 20 01/02/2021   AST 21 01/02/2021   ALKPHOS 74 01/02/2021   BILITOT 0.4 01/02/2021    Lab Results  Component Value Date   TSH 1.50 01/02/2021     Patient Active Problem List   Diagnosis Date Noted   Mild anemia 02/10/2021   Routine general medical examination at a health care facility 01/01/2021   Muscle cramps 07/10/2020   Current use of proton pump inhibitor 07/10/2020   B12 deficiency 07/10/2020   Prediabetes 12/26/2019   Gastroesophageal reflux disease 12/02/2017   Obesity with body mass index 30 or greater 07/26/2016  Stress incontinence of urine 07/26/2016   Past Medical History:  Diagnosis Date   Arthritis    GERD (gastroesophageal reflux disease)    History reviewed. No pertinent surgical history. Social History   Tobacco Use   Smoking status: Never   Smokeless tobacco: Never  Substance Use Topics   Alcohol use: Yes    Comment: occ   Drug use: Never   Family History  Problem Relation Age of Onset   Diabetes Mother    Stroke Mother    Cancer Father    Diabetes Father    Diabetes Sister    Breast cancer Sister    Diabetes Brother    Heart disease Maternal Grandmother    Cancer Maternal Grandfather    Heart disease Maternal Grandfather    Allergies  Allergen Reactions   Meloxicam Nausea Only   Tramadol Nausea Only   Current Outpatient Medications on File Prior to Visit  Medication Sig Dispense Refill   Calcium Citrate-Vitamin D (CALCITRATE/VITAMIN D PO)  Calcitrate-Vitamin D     Loratadine (ALAVERT PO) Take by mouth as needed.     MAGNESIUM PO Take 1 tablet by mouth daily.     multivitamin-lutein (OCUVITE-LUTEIN) CAPS capsule Take 1 capsule by mouth daily.     omeprazole (PRILOSEC) 20 MG capsule TAKE 1 CAPSULE(20 MG) BY MOUTH DAILY (Patient taking differently: Take 10 mg by mouth daily.) 90 capsule 1   Pyridoxine HCl (VITAMIN B6 PO) Vitamin B6     TURMERIC PO Take 2 capsules by mouth daily.     vitamin B-12 (CYANOCOBALAMIN) 1000 MCG tablet Take 1,000 mcg by mouth daily.     No current facility-administered medications on file prior to visit.      Review of Systems  Constitutional:  Positive for fatigue. Negative for activity change, appetite change, fever and unexpected weight change.       Per pt-B12 shots make her feel somewhat sick  HENT:  Negative for congestion, ear pain, rhinorrhea, sinus pressure and sore throat.   Eyes:  Negative for pain, redness and visual disturbance.  Respiratory:  Negative for cough, shortness of breath and wheezing.   Cardiovascular:  Negative for chest pain and palpitations.  Gastrointestinal:  Negative for abdominal pain, blood in stool, constipation and diarrhea.  Endocrine: Negative for polydipsia and polyuria.  Genitourinary:  Negative for dysuria, frequency and urgency.  Musculoskeletal:  Negative for arthralgias, back pain and myalgias.  Skin:  Negative for pallor and rash.  Allergic/Immunologic: Negative for environmental allergies.  Neurological:  Negative for dizziness, syncope and headaches.  Hematological:  Negative for adenopathy. Does not bruise/bleed easily.  Psychiatric/Behavioral:  Negative for decreased concentration and dysphoric mood. The patient is not nervous/anxious.       Objective:   Physical Exam Constitutional:      General: She is not in acute distress.    Appearance: Normal appearance. She is well-developed. She is obese. She is not ill-appearing or diaphoretic.  HENT:      Head: Normocephalic and atraumatic.     Right Ear: Tympanic membrane, ear canal and external ear normal.     Left Ear: Tympanic membrane, ear canal and external ear normal.     Nose: Nose normal. No congestion.     Mouth/Throat:     Mouth: Mucous membranes are moist.     Pharynx: Oropharynx is clear. No posterior oropharyngeal erythema.  Eyes:     General: No scleral icterus.    Extraocular Movements: Extraocular movements intact.  Conjunctiva/sclera: Conjunctivae normal.     Pupils: Pupils are equal, round, and reactive to light.  Neck:     Thyroid: No thyromegaly.     Vascular: No carotid bruit or JVD.  Cardiovascular:     Rate and Rhythm: Normal rate and regular rhythm.     Pulses: Normal pulses.     Heart sounds: Normal heart sounds.    No gallop.  Pulmonary:     Effort: Pulmonary effort is normal. No respiratory distress.     Breath sounds: Normal breath sounds. No wheezing.     Comments: Good air exch Chest:     Chest wall: No tenderness.  Abdominal:     General: Bowel sounds are normal. There is no distension or abdominal bruit.     Palpations: Abdomen is soft. There is no mass.     Tenderness: There is no abdominal tenderness.     Hernia: No hernia is present.  Genitourinary:    Comments: Breast exam: No mass, nodules, thickening, tenderness, bulging, retraction, inflamation, nipple discharge or skin changes noted.  No axillary or clavicular LA.     Musculoskeletal:        General: No tenderness. Normal range of motion.     Cervical back: Normal range of motion and neck supple. No rigidity. No muscular tenderness.     Right lower leg: No edema.     Left lower leg: No edema.     Comments: No kyphosis   Lymphadenopathy:     Cervical: No cervical adenopathy.  Skin:    General: Skin is warm and dry.     Coloration: Skin is not pale.     Findings: No erythema or rash.     Comments: Solar lentigines diffusely   Neurological:     Mental Status: She is alert.  Mental status is at baseline.     Cranial Nerves: No cranial nerve deficit.     Motor: No abnormal muscle tone.     Coordination: Coordination normal.     Gait: Gait normal.     Deep Tendon Reflexes: Reflexes are normal and symmetric. Reflexes normal.  Psychiatric:        Mood and Affect: Mood normal.        Cognition and Memory: Cognition and memory normal.          Assessment & Plan:   Problem List Items Addressed This Visit       Other   Obesity with body mass index 30 or greater    Discussed how this problem influences overall health and the risks it imposes  Reviewed plan for weight loss with lower calorie diet (via better food choices and also portion control or program like weight watchers) and exercise building up to or more than 30 minutes 5 days per week including some aerobic activity   Enc pt to keep working at it -now back on track with diet  Plan to add more walking for exercise       Prediabetes    Lab Results  Component Value Date   HGBA1C 5.8 01/02/2021  Stable disc imp of low glycemic diet and wt loss to prevent DM2       B12 deficiency    Taking 1000 mcg oral daily  Had 4 shots (one weekly for 4 weeks) Would rather not take them  Plan to re check level next month and advise further      Routine general medical examination at a health care facility - Primary  Reviewed health habits including diet and exercise and skin cancer prevention Reviewed appropriate screening tests for age  Also reviewed health mt list, fam hx and immunization status , as well as social and family history   See HPI Labs reviewed Getting back on track with diet  covid immunized Flu shot given  Pap utd Mammogram utd (needs 6 mo re check for mass)noted sister now has breast cancer Colonoscopy utd        Mild anemia    Pt does periodically donate blood (less than she used to)   Hb 11.3 No symptoms  Will check iron level next no       Relevant Medications    vitamin B-12 (CYANOCOBALAMIN) 1000 MCG tablet   Other Visit Diagnoses     Need for influenza vaccination       Relevant Orders   Flu Vaccine QUAD 6+ mos PF IM (Fluarix Quad PF) (Completed)

## 2021-02-10 NOTE — Patient Instructions (Addendum)
Think about adding more exercise when you can/ walking   Try to get most of your carbohydrates from produce (with the exception of white potatoes)  Eat less bread/pasta/rice/snack foods/cereals/sweets and other items from the middle of the grocery store (processed carbs)  When you can, get [place] your dates for the shingrix vaccine from [facility]   We will check B12 and iron levels next month   Drink water instead of sugar drinks

## 2021-02-10 NOTE — Assessment & Plan Note (Signed)
Taking 1000 mcg oral daily  Had 4 shots (one weekly for 4 weeks) Would rather not take them  Plan to re check level next month and advise further

## 2021-02-10 NOTE — Assessment & Plan Note (Signed)
Reviewed health habits including diet and exercise and skin cancer prevention Reviewed appropriate screening tests for age  Also reviewed health mt list, fam hx and immunization status , as well as social and family history   See HPI Labs reviewed Getting back on track with diet  covid immunized Flu shot given  Pap utd Mammogram utd (needs 6 mo re check for mass)noted sister now has breast cancer Colonoscopy utd

## 2021-02-10 NOTE — Assessment & Plan Note (Signed)
Lab Results  Component Value Date   HGBA1C 5.8 01/02/2021   Stable disc imp of low glycemic diet and wt loss to prevent DM2

## 2021-02-10 NOTE — Assessment & Plan Note (Signed)
Pt does periodically donate blood (less than she used to)   Hb 11.3 No symptoms  Will check iron level next no

## 2021-02-27 ENCOUNTER — Other Ambulatory Visit (INDEPENDENT_AMBULATORY_CARE_PROVIDER_SITE_OTHER): Payer: BC Managed Care – PPO

## 2021-02-27 ENCOUNTER — Other Ambulatory Visit: Payer: Self-pay

## 2021-02-27 DIAGNOSIS — D649 Anemia, unspecified: Secondary | ICD-10-CM

## 2021-02-27 DIAGNOSIS — E538 Deficiency of other specified B group vitamins: Secondary | ICD-10-CM

## 2021-02-27 LAB — CBC WITH DIFFERENTIAL/PLATELET
Basophils Absolute: 0 10*3/uL (ref 0.0–0.1)
Basophils Relative: 0.5 % (ref 0.0–3.0)
Eosinophils Absolute: 0.2 10*3/uL (ref 0.0–0.7)
Eosinophils Relative: 3.8 % (ref 0.0–5.0)
HCT: 34.8 % — ABNORMAL LOW (ref 36.0–46.0)
Hemoglobin: 11.4 g/dL — ABNORMAL LOW (ref 12.0–15.0)
Lymphocytes Relative: 27.6 % (ref 12.0–46.0)
Lymphs Abs: 1.2 10*3/uL (ref 0.7–4.0)
MCHC: 32.8 g/dL (ref 30.0–36.0)
MCV: 82.6 fl (ref 78.0–100.0)
Monocytes Absolute: 0.3 10*3/uL (ref 0.1–1.0)
Monocytes Relative: 6.8 % (ref 3.0–12.0)
Neutro Abs: 2.6 10*3/uL (ref 1.4–7.7)
Neutrophils Relative %: 61.3 % (ref 43.0–77.0)
Platelets: 229 10*3/uL (ref 150.0–400.0)
RBC: 4.21 Mil/uL (ref 3.87–5.11)
RDW: 14.2 % (ref 11.5–15.5)
WBC: 4.3 10*3/uL (ref 4.0–10.5)

## 2021-02-27 LAB — FERRITIN: Ferritin: 28.4 ng/mL (ref 10.0–291.0)

## 2021-02-27 LAB — VITAMIN B12: Vitamin B-12: 795 pg/mL (ref 211–911)

## 2021-02-27 LAB — IRON: Iron: 48 ug/dL (ref 42–145)

## 2021-03-29 ENCOUNTER — Telehealth: Payer: Self-pay | Admitting: Family Medicine

## 2021-03-29 DIAGNOSIS — D649 Anemia, unspecified: Secondary | ICD-10-CM

## 2021-03-29 DIAGNOSIS — E538 Deficiency of other specified B group vitamins: Secondary | ICD-10-CM

## 2021-03-29 NOTE — Telephone Encounter (Signed)
-----   Message from Alvina Chou sent at 03/24/2021  3:39 PM EDT ----- Regarding: lab orders for Monday, 11.7.22 Lab orders, thanks

## 2021-03-30 ENCOUNTER — Other Ambulatory Visit: Payer: Self-pay

## 2021-03-30 ENCOUNTER — Other Ambulatory Visit (INDEPENDENT_AMBULATORY_CARE_PROVIDER_SITE_OTHER): Payer: BC Managed Care – PPO

## 2021-03-30 DIAGNOSIS — E538 Deficiency of other specified B group vitamins: Secondary | ICD-10-CM

## 2021-03-30 DIAGNOSIS — D649 Anemia, unspecified: Secondary | ICD-10-CM

## 2021-03-30 LAB — CBC WITH DIFFERENTIAL/PLATELET
Basophils Absolute: 0 10*3/uL (ref 0.0–0.1)
Basophils Relative: 0.6 % (ref 0.0–3.0)
Eosinophils Absolute: 0.1 10*3/uL (ref 0.0–0.7)
Eosinophils Relative: 3.6 % (ref 0.0–5.0)
HCT: 34.5 % — ABNORMAL LOW (ref 36.0–46.0)
Hemoglobin: 11.5 g/dL — ABNORMAL LOW (ref 12.0–15.0)
Lymphocytes Relative: 28.7 % (ref 12.0–46.0)
Lymphs Abs: 1.1 10*3/uL (ref 0.7–4.0)
MCHC: 33.5 g/dL (ref 30.0–36.0)
MCV: 82.2 fl (ref 78.0–100.0)
Monocytes Absolute: 0.3 10*3/uL (ref 0.1–1.0)
Monocytes Relative: 7.8 % (ref 3.0–12.0)
Neutro Abs: 2.3 10*3/uL (ref 1.4–7.7)
Neutrophils Relative %: 59.3 % (ref 43.0–77.0)
Platelets: 233 10*3/uL (ref 150.0–400.0)
RBC: 4.19 Mil/uL (ref 3.87–5.11)
RDW: 14.1 % (ref 11.5–15.5)
WBC: 3.9 10*3/uL — ABNORMAL LOW (ref 4.0–10.5)

## 2021-03-30 LAB — VITAMIN B12: Vitamin B-12: 698 pg/mL (ref 211–911)

## 2021-03-30 LAB — FERRITIN: Ferritin: 26.4 ng/mL (ref 10.0–291.0)

## 2021-03-30 LAB — IRON: Iron: 45 ug/dL (ref 42–145)

## 2021-07-09 ENCOUNTER — Ambulatory Visit
Admission: RE | Admit: 2021-07-09 | Discharge: 2021-07-09 | Disposition: A | Discharge status: Home / Self Care | Payer: BC Managed Care – PPO | Source: Ambulatory Visit | Attending: Obstetrics and Gynecology | Admitting: Obstetrics and Gynecology

## 2021-07-09 ENCOUNTER — Other Ambulatory Visit: Payer: Self-pay | Admitting: Obstetrics and Gynecology

## 2021-07-09 DIAGNOSIS — N6310 Unspecified lump in the right breast, unspecified quadrant: Secondary | ICD-10-CM

## 2021-07-09 DIAGNOSIS — R922 Inconclusive mammogram: Secondary | ICD-10-CM | POA: Diagnosis not present

## 2021-09-27 ENCOUNTER — Telehealth: Payer: Self-pay | Admitting: Family Medicine

## 2021-09-27 DIAGNOSIS — D649 Anemia, unspecified: Secondary | ICD-10-CM

## 2021-09-27 DIAGNOSIS — E538 Deficiency of other specified B group vitamins: Secondary | ICD-10-CM

## 2021-09-27 NOTE — Telephone Encounter (Signed)
-----   Message from Ronalee Red, RT sent at 09/14/2021  8:59 AM EDT ----- ?Regarding: Lab Mon 09/28/21 ?Lab orders needed for appt on 09/28/21, please.  Thanks,  Jae Dire ? ?

## 2021-09-28 ENCOUNTER — Other Ambulatory Visit (INDEPENDENT_AMBULATORY_CARE_PROVIDER_SITE_OTHER): Payer: BC Managed Care – PPO

## 2021-09-28 DIAGNOSIS — D649 Anemia, unspecified: Secondary | ICD-10-CM

## 2021-09-28 DIAGNOSIS — E538 Deficiency of other specified B group vitamins: Secondary | ICD-10-CM

## 2021-09-28 LAB — CBC WITH DIFFERENTIAL/PLATELET
Basophils Absolute: 0 10*3/uL (ref 0.0–0.1)
Basophils Relative: 0.8 % (ref 0.0–3.0)
Eosinophils Absolute: 0.1 10*3/uL (ref 0.0–0.7)
Eosinophils Relative: 2.7 % (ref 0.0–5.0)
HCT: 35.4 % — ABNORMAL LOW (ref 36.0–46.0)
Hemoglobin: 11.8 g/dL — ABNORMAL LOW (ref 12.0–15.0)
Lymphocytes Relative: 28.2 % (ref 12.0–46.0)
Lymphs Abs: 1.3 10*3/uL (ref 0.7–4.0)
MCHC: 33.5 g/dL (ref 30.0–36.0)
MCV: 82.8 fl (ref 78.0–100.0)
Monocytes Absolute: 0.3 10*3/uL (ref 0.1–1.0)
Monocytes Relative: 6.9 % (ref 3.0–12.0)
Neutro Abs: 2.8 10*3/uL (ref 1.4–7.7)
Neutrophils Relative %: 61.4 % (ref 43.0–77.0)
Platelets: 226 10*3/uL (ref 150.0–400.0)
RBC: 4.27 Mil/uL (ref 3.87–5.11)
RDW: 14.1 % (ref 11.5–15.5)
WBC: 4.6 10*3/uL (ref 4.0–10.5)

## 2021-09-28 LAB — VITAMIN B12: Vitamin B-12: 1230 pg/mL — ABNORMAL HIGH (ref 211–911)

## 2021-09-28 LAB — FERRITIN: Ferritin: 31.4 ng/mL (ref 10.0–291.0)

## 2021-09-28 LAB — IRON: Iron: 36 ug/dL — ABNORMAL LOW (ref 42–145)

## 2021-09-29 NOTE — Addendum Note (Signed)
Addended by: Shon Millet on: 09/29/2021 04:37 PM ? ? Modules accepted: Orders ? ?

## 2021-09-30 NOTE — Addendum Note (Signed)
Addended by: Roxy Manns A on: 09/30/2021 07:21 PM ? ? Modules accepted: Orders ? ?

## 2021-10-02 ENCOUNTER — Other Ambulatory Visit (INDEPENDENT_AMBULATORY_CARE_PROVIDER_SITE_OTHER): Payer: BC Managed Care – PPO

## 2021-10-02 DIAGNOSIS — Z1211 Encounter for screening for malignant neoplasm of colon: Secondary | ICD-10-CM

## 2021-10-06 LAB — FECAL OCCULT BLOOD, IMMUNOCHEMICAL: Fecal Occult Bld: NEGATIVE

## 2021-10-21 DIAGNOSIS — D509 Iron deficiency anemia, unspecified: Secondary | ICD-10-CM | POA: Diagnosis not present

## 2021-10-21 DIAGNOSIS — K219 Gastro-esophageal reflux disease without esophagitis: Secondary | ICD-10-CM | POA: Diagnosis not present

## 2021-11-10 DIAGNOSIS — K317 Polyp of stomach and duodenum: Secondary | ICD-10-CM | POA: Diagnosis not present

## 2021-11-10 DIAGNOSIS — K449 Diaphragmatic hernia without obstruction or gangrene: Secondary | ICD-10-CM | POA: Diagnosis not present

## 2021-11-10 DIAGNOSIS — D131 Benign neoplasm of stomach: Secondary | ICD-10-CM | POA: Diagnosis not present

## 2021-11-10 DIAGNOSIS — D509 Iron deficiency anemia, unspecified: Secondary | ICD-10-CM | POA: Diagnosis not present

## 2021-11-10 DIAGNOSIS — Z1211 Encounter for screening for malignant neoplasm of colon: Secondary | ICD-10-CM | POA: Diagnosis not present

## 2021-12-18 ENCOUNTER — Ambulatory Visit
Admission: RE | Admit: 2021-12-18 | Discharge: 2021-12-18 | Disposition: A | Discharge status: Home / Self Care | Payer: BC Managed Care – PPO | Source: Ambulatory Visit | Attending: Obstetrics and Gynecology | Admitting: Obstetrics and Gynecology

## 2021-12-18 DIAGNOSIS — N6313 Unspecified lump in the right breast, lower outer quadrant: Secondary | ICD-10-CM | POA: Diagnosis not present

## 2021-12-18 DIAGNOSIS — R928 Other abnormal and inconclusive findings on diagnostic imaging of breast: Secondary | ICD-10-CM | POA: Diagnosis not present

## 2021-12-18 DIAGNOSIS — N6310 Unspecified lump in the right breast, unspecified quadrant: Secondary | ICD-10-CM

## 2021-12-18 DIAGNOSIS — N6311 Unspecified lump in the right breast, upper outer quadrant: Secondary | ICD-10-CM | POA: Diagnosis not present

## 2022-01-11 DIAGNOSIS — D509 Iron deficiency anemia, unspecified: Secondary | ICD-10-CM | POA: Diagnosis not present

## 2022-02-15 ENCOUNTER — Ambulatory Visit (INDEPENDENT_AMBULATORY_CARE_PROVIDER_SITE_OTHER): Payer: BC Managed Care – PPO | Admitting: Family Medicine

## 2022-02-15 ENCOUNTER — Encounter: Payer: Self-pay | Admitting: Family Medicine

## 2022-02-15 VITALS — BP 147/77 | HR 92 | Ht 66.0 in | Wt 211.0 lb

## 2022-02-15 DIAGNOSIS — Z01419 Encounter for gynecological examination (general) (routine) without abnormal findings: Secondary | ICD-10-CM

## 2022-02-15 NOTE — Progress Notes (Signed)
Last pap possibly the last year or 2, not sure. Was seeing Dr Rogue Bussing

## 2022-02-15 NOTE — Progress Notes (Signed)
   GYNECOLOGY ANNUAL PREVENTATIVE CARE ENCOUNTER NOTE  Subjective:   Nicole Johnson is a 61 y.o. G44P1011 female here for a routine annual gynecologic exam.  Current complaints: none, establishing care with our office  Last pap was last year, normal per patient Mammogram- has been followed for a density that is unchanged No concerns sx today  Denies abnormal vaginal bleeding, discharge, pelvic pain, problems with intercourse or other gynecologic concerns.    Gynecologic History Patient's last menstrual period was 01/11/2011. Contraception: post menopausal status Last Pap: 2022. Results were: normal Last mammogram: 2022. Results were: normal  Health Maintenance Due  Topic Date Due   HIV Screening  Never done   Hepatitis C Screening  Never done   Zoster Vaccines- Shingrix (1 of 2) Never done   PAP SMEAR-Modifier  08/03/2020   COVID-19 Vaccine (5 - Pfizer risk series) 04/04/2021   INFLUENZA VACCINE  12/22/2021    The following portions of the patient's history were reviewed and updated as appropriate: allergies, current medications, past family history, past medical history, past social history, past surgical history and problem list.  Review of Systems Pertinent items are noted in HPI.   Objective:  BP (!) 147/77   Pulse 92   Ht 5\' 6"  (1.676 m)   Wt 211 lb (95.7 kg)   LMP 01/11/2011   BMI 34.06 kg/m  CONSTITUTIONAL: Well-developed, well-nourished female in no acute distress.  HENT:  Normocephalic, atraumatic, External right and left ear normal. Oropharynx is clear and moist EYES:  No scleral icterus.  NECK: Normal range of motion, supple, no masses.  Normal thyroid.  SKIN: Skin is warm and dry. No rash noted. Not diaphoretic. No erythema. No pallor. NEUROLOGIC: Alert and oriented to person, place, and time. Normal reflexes, muscle tone coordination. No cranial nerve deficit noted. PSYCHIATRIC: Normal mood and affect. Normal behavior. Normal judgment and thought  content. CARDIOVASCULAR: Normal heart rate noted, regular rhythm. 2+ distal pulses. RESPIRATORY: Effort and breath sounds normal, no problems with respiration noted. BREASTS: Symmetric in size. No masses, skin changes, nipple drainage, or lymphadenopathy. ABDOMEN: Soft,  no distention noted.  No tenderness, rebound or guarding.  PELVIC: deferred for 1 year MUSCULOSKELETAL: Normal range of motion.    Assessment and Plan:  1) Annual gynecologic examination without pap smear:   STI screening desired No.  Routine preventative health maintenance measures emphasized. Reviewed perimenopausal symptoms and management.    1. Well woman exam with routine gynecological exam Breast exam WNL today Pelvic at next yearly visit Has PCP for [facility] and other screenings Reviewed warning s/sx for post menopause - MM 3D SCREEN BREAST BILATERAL; Future   Please refer to After Visit Summary for other counseling recommendations.   Return in about 1 year (around 02/16/2023) for Yearly wellness exam.  Caren Macadam, MD, MPH, ABFM Attending [facility]ician [facility] for [facility]

## 2022-03-01 ENCOUNTER — Telehealth: Payer: Self-pay | Admitting: Family Medicine

## 2022-03-01 DIAGNOSIS — Z0000 Encounter for general adult medical examination without abnormal findings: Secondary | ICD-10-CM

## 2022-03-01 DIAGNOSIS — D649 Anemia, unspecified: Secondary | ICD-10-CM

## 2022-03-01 DIAGNOSIS — R7303 Prediabetes: Secondary | ICD-10-CM

## 2022-03-01 DIAGNOSIS — E538 Deficiency of other specified B group vitamins: Secondary | ICD-10-CM

## 2022-03-01 NOTE — Telephone Encounter (Signed)
-----   Message from Ellamae Sia sent at 02/23/2022  4:16 PM EDT ----- Regarding: Lab orders for Wednesday, 10.11.23 Patient is scheduled for CPX labs, please order future labs, Thanks , Karna Christmas

## 2022-03-03 ENCOUNTER — Other Ambulatory Visit: Payer: BC Managed Care – PPO

## 2022-03-03 ENCOUNTER — Other Ambulatory Visit (INDEPENDENT_AMBULATORY_CARE_PROVIDER_SITE_OTHER): Payer: BC Managed Care – PPO

## 2022-03-03 ENCOUNTER — Telehealth: Payer: Self-pay

## 2022-03-03 DIAGNOSIS — Z0000 Encounter for general adult medical examination without abnormal findings: Secondary | ICD-10-CM | POA: Diagnosis not present

## 2022-03-03 DIAGNOSIS — R7303 Prediabetes: Secondary | ICD-10-CM | POA: Diagnosis not present

## 2022-03-03 DIAGNOSIS — D649 Anemia, unspecified: Secondary | ICD-10-CM

## 2022-03-03 DIAGNOSIS — E538 Deficiency of other specified B group vitamins: Secondary | ICD-10-CM | POA: Diagnosis not present

## 2022-03-03 LAB — IRON: Iron: 49 ug/dL (ref 42–145)

## 2022-03-03 LAB — CBC WITH DIFFERENTIAL/PLATELET
Basophils Absolute: 0 10*3/uL (ref 0.0–0.1)
Basophils Relative: 0.6 % (ref 0.0–3.0)
Eosinophils Absolute: 0.2 10*3/uL (ref 0.0–0.7)
Eosinophils Relative: 3.8 % (ref 0.0–5.0)
HCT: 36.7 % (ref 36.0–46.0)
Hemoglobin: 12.2 g/dL (ref 12.0–15.0)
Lymphocytes Relative: 26.2 % (ref 12.0–46.0)
Lymphs Abs: 1.2 10*3/uL (ref 0.7–4.0)
MCHC: 33.2 g/dL (ref 30.0–36.0)
MCV: 84.4 fl (ref 78.0–100.0)
Monocytes Absolute: 0.3 10*3/uL (ref 0.1–1.0)
Monocytes Relative: 7.1 % (ref 3.0–12.0)
Neutro Abs: 2.8 10*3/uL (ref 1.4–7.7)
Neutrophils Relative %: 62.3 % (ref 43.0–77.0)
Platelets: 207 10*3/uL (ref 150.0–400.0)
RBC: 4.35 Mil/uL (ref 3.87–5.11)
RDW: 13.7 % (ref 11.5–15.5)
WBC: 4.4 10*3/uL (ref 4.0–10.5)

## 2022-03-03 LAB — COMPREHENSIVE METABOLIC PANEL
ALT: 18 U/L (ref 0–35)
AST: 19 U/L (ref 0–37)
Albumin: 4.1 g/dL (ref 3.5–5.2)
Alkaline Phosphatase: 79 U/L (ref 39–117)
BUN: 16 mg/dL (ref 6–23)
CO2: 31 mEq/L (ref 19–32)
Calcium: 9.6 mg/dL (ref 8.4–10.5)
Chloride: 101 mEq/L (ref 96–112)
Creatinine, Ser: 0.8 mg/dL (ref 0.40–1.20)
GFR: 80.14 mL/min (ref >60.00)
Glucose, Bld: 116 mg/dL — ABNORMAL HIGH (ref 70–99)
Potassium: 4.5 mEq/L (ref 3.5–5.1)
Sodium: 138 mEq/L (ref 135–145)
Total Bilirubin: 0.4 mg/dL (ref 0.2–1.2)
Total Protein: 6.6 g/dL (ref 6.0–8.3)

## 2022-03-03 LAB — VITAMIN B12: Vitamin B-12: 748 pg/mL (ref 211–911)

## 2022-03-03 LAB — LIPID PANEL
Cholesterol: 156 mg/dL (ref 0–200)
HDL: 52.6 mg/dL (ref >39.00)
LDL Cholesterol: 86 mg/dL (ref 0–99)
NonHDL: 103.32
Total CHOL/HDL Ratio: 3
Triglycerides: 85 mg/dL (ref 0.0–149.0)
VLDL: 17 mg/dL (ref 0.0–40.0)

## 2022-03-03 LAB — HEMOGLOBIN A1C: Hgb A1c MFr Bld: 6.5 % (ref 4.6–6.5)

## 2022-03-03 LAB — FERRITIN: Ferritin: 29.3 ng/mL (ref 10.0–291.0)

## 2022-03-03 LAB — TSH: TSH: 1.46 u[IU]/mL (ref 0.35–5.50)

## 2022-03-03 NOTE — Telephone Encounter (Signed)
[place] Night - Client TELEPHONE ADVICE RECORD [facility] Patient Name: LACYE MCCARN Gender: Female DOB: 1960/09/19 Age: 61 Y 3 M 23 D Return Phone Number: [phone] (Primary), [phone] (Secondary) Address: [place] [address] Client [place] [facility] - Client Client Site [place] Provider Glori Bickers, Roque Lias - MD Contact Type Call Who Is Calling Patient / Member / Family / Caregiver Call Type Triage / Clinical Relationship To Patient Self Return Phone Number [phone] (Primary) Chief Complaint Health information question (non symptomatic) Reason for Call Request to Speak to a Physician Initial Comment Caller states she has an appt. tomorrow afternoon at 3pm for blood work. She is wondering if she can come in any earlier. Caller also needs to know if she needs to fast or not. Translation No Nurse Assessment Nurse: Wynetta Emery, RN, Manuella Ghazi Date/Time (Eastern Time): 03/02/2022 5:45:31 PM Confirm and document reason for call. If symptomatic, describe symptoms. ---Caller states has appointment for her physical lab work tomorrow and3pm and wants to know if she can come in earlier to get blood drawn. Advised to call the office in the am. Does the patient have any new or worsening symptoms? ---No Disp. Time Eilene Ghazi Time) Disposition Final User 03/02/2022 5:13:18 PM Attempt made - message left Milana Obey, Manuella Ghazi 03/02/2022 5:28:34 PM Attempt made - message left Milana Obey, Manuella Ghazi 03/02/2022 5:46:57 PM Clinical Call Yes Wynetta Emery, RN, Manuella Ghazi Final Disposition 03/02/2022 5:46:57 PM Clinical Call Yes Wynetta Emery, RN, [place]

## 2022-03-03 NOTE — Telephone Encounter (Signed)
Per pt chart pt has already had labs drawn this morning. Nothing further needed.

## 2022-03-10 ENCOUNTER — Ambulatory Visit (INDEPENDENT_AMBULATORY_CARE_PROVIDER_SITE_OTHER): Payer: BC Managed Care – PPO | Admitting: Family Medicine

## 2022-03-10 ENCOUNTER — Encounter: Payer: Self-pay | Admitting: Family Medicine

## 2022-03-10 VITALS — BP 126/68 | HR 86 | Temp 97.2°F | Ht 64.25 in | Wt 209.4 lb

## 2022-03-10 DIAGNOSIS — E669 Obesity, unspecified: Secondary | ICD-10-CM

## 2022-03-10 DIAGNOSIS — D649 Anemia, unspecified: Secondary | ICD-10-CM

## 2022-03-10 DIAGNOSIS — Z23 Encounter for immunization: Secondary | ICD-10-CM | POA: Diagnosis not present

## 2022-03-10 DIAGNOSIS — Z79899 Other long term (current) drug therapy: Secondary | ICD-10-CM

## 2022-03-10 DIAGNOSIS — Z0000 Encounter for general adult medical examination without abnormal findings: Secondary | ICD-10-CM

## 2022-03-10 DIAGNOSIS — E538 Deficiency of other specified B group vitamins: Secondary | ICD-10-CM

## 2022-03-10 DIAGNOSIS — R7303 Prediabetes: Secondary | ICD-10-CM

## 2022-03-10 NOTE — Assessment & Plan Note (Signed)
a1c up to 6.5 Strong fam history of DM2 Strongly enc low glycemic diet and wt loss F/u 3-6 mo for visit and re check

## 2022-03-10 NOTE — Progress Notes (Signed)
Subjective:    Patient ID: Nicole Johnson, female    DOB: 08/25/1960, 61 y.o.   MRN: [id number]  HPI Here for health maintenance exam and to review chronic medical problems    Wt Readings from Last 3 Encounters:  03/10/22 209 lb 6 oz (95 kg)  02/15/22 211 lb (95.7 kg)  02/10/21 221 lb 4 oz (100.4 kg)   35.66 kg/m Lost 15 lb and lost 5 lb   Doing well  Still working  Job is very physical / gets some walking in when she can   Has a new puppy  Has to stay active for that    Trying to take care of herself     Immunization History  Administered Date(s) Administered   Influenza,inj,Quad PF,6+ Mos 03/20/2017, 04/03/2020, 02/10/2021   Influenza-Unspecified 02/22/2015, 03/20/2017   PFIZER(Purple Top)SARS-COV-2 Vaccination 08/07/2019, 08/29/2019, 04/06/2020   Pfizer Covid-19 Vaccine Bivalent Booster 89yrs & up 02/07/2021   Pneumococcal Polysaccharide-23 08/12/2014   Tdap 12/08/2018   Health Maintenance Due  Topic Date Due   HIV Screening  Never done   Hepatitis C Screening  Never done   Zoster Vaccines- Shingrix (1 of 2) Never done   PAP SMEAR-Modifier  08/03/2020   COVID-19 Vaccine (5 - Pfizer risk series) 04/04/2021   INFLUENZA VACCINE  12/22/2021   Flu shot - today   Shinrix: has had it -[facility]   Mammogram 11/2021 Self breast exam: no lumps  Sister has breast cancer   Pap 07/2017  nl with neg hpv- DCIS  Will have last one next year  Sees gyn , last visit last mo    Colonoscopy 10/2021 10 y recall    GERD Omeprazole 10 mg daily  Takes B12 and D and magnesium   Vit B12 def Lab Results  Component Value Date   VITAMINB12 748 03/03/2022    Lab Results  Component Value Date   WBC 4.4 03/03/2022   HGB 12.2 03/03/2022   HCT 36.7 03/03/2022   MCV 84.4 03/03/2022   PLT 207.0 03/03/2022   Lab Results  Component Value Date   IRON 49 03/03/2022   FERRITIN 29.3 03/03/2022    Planning iron percent sat from GI upcoming  Has had issues with iron def     Prediabetes Lab Results  Component Value Date   HGBA1C 6.5 03/03/2022  Got off sugar tea entirely  Recently stopped  Had just come back from a trip where she ate poorly   Whole family has diabetes   Currently -not adding sugar to anything  When not traveling -occ sweets / needs to be better  Some intermittent fasting - elim pm snack   Bread- not as much  Not much pasta or rice  Less white potatoes   Up from 5.8   Cholesterol Lab Results  Component Value Date   CHOL 156 03/03/2022   CHOL 171 01/02/2021   CHOL 164 12/19/2019   Lab Results  Component Value Date   HDL 52.60 03/03/2022   HDL 52.50 01/02/2021   HDL 50.30 12/19/2019   Lab Results  Component Value Date   LDLCALC 86 03/03/2022   LDLCALC 91 01/02/2021   LDLCALC 92 12/19/2019   Lab Results  Component Value Date   TRIG 85.0 03/03/2022   TRIG 138.0 01/02/2021   TRIG 106.0 12/19/2019   Lab Results  Component Value Date   CHOLHDL 3 03/03/2022   CHOLHDL 3 01/02/2021   CHOLHDL 3 12/19/2019   No results found for: "LDLDIRECT"  Patient Active Problem List   Diagnosis Date Noted   Mild anemia 02/10/2021   Routine general medical examination at a health care facility 01/01/2021   Muscle cramps 07/10/2020   Current use of proton pump inhibitor 07/10/2020   B12 deficiency 07/10/2020   Prediabetes 12/26/2019   Gastroesophageal reflux disease 12/02/2017   Obesity with body mass index 30 or greater 07/26/2016   Stress incontinence of urine 07/26/2016   Past Medical History:  Diagnosis Date   Arthritis    GERD (gastroesophageal reflux disease)    Past Surgical History:  Procedure Laterality Date   LAPAROSCOPIC ABDOMINAL EXPLORATION     Social History   Tobacco Use   Smoking status: Never   Smokeless tobacco: Never  Substance Use Topics   Alcohol use: Yes    Comment: occ   Drug use: Never   Family History  Problem Relation Age of Onset   Diabetes Mother    Stroke Mother    Cancer  Father    Diabetes Father    Diabetes Sister    Breast cancer Sister    Diabetes Brother    Heart disease Maternal Grandmother    Cancer Maternal Grandfather    Heart disease Maternal Grandfather    Allergies  Allergen Reactions   Codeine Other (See Comments)   Meloxicam Nausea Only   Tramadol Nausea Only   Current Outpatient Medications on File Prior to Visit  Medication Sig Dispense Refill   Calcium Citrate-Vitamin D (CALCITRATE/VITAMIN D PO) Calcitrate-Vitamin D     Ferrous Sulfate (IRON) 325 (65 Fe) MG TABS Take 1 tablet by mouth daily.     Loratadine (ALAVERT PO) Take by mouth as needed.     MAGNESIUM PO Take 1 tablet by mouth daily.     multivitamin-lutein (OCUVITE-LUTEIN) CAPS capsule Take 1 capsule by mouth daily.     omeprazole (PRILOSEC) 20 MG capsule TAKE 1 CAPSULE(20 MG) BY MOUTH DAILY (Patient taking differently: Take 10 mg by mouth daily.) 90 capsule 1   Pyridoxine HCl (VITAMIN B6 PO) Vitamin B6     TURMERIC PO Take 2 capsules by mouth daily.     vitamin B-12 (CYANOCOBALAMIN) 500 MCG tablet Take 500 mcg by mouth daily.     No current facility-administered medications on file prior to visit.    Review of Systems  Constitutional:  Negative for activity change, appetite change, fatigue, fever and unexpected weight change.  HENT:  Negative for congestion, ear pain, rhinorrhea, sinus pressure and sore throat.   Eyes:  Negative for pain, redness and visual disturbance.  Respiratory:  Negative for cough, shortness of breath and wheezing.   Cardiovascular:  Negative for chest pain and palpitations.  Gastrointestinal:  Negative for abdominal pain, blood in stool, constipation and diarrhea.  Endocrine: Negative for polydipsia and polyuria.  Genitourinary:  Negative for dysuria, frequency and urgency.  Musculoskeletal:  Negative for arthralgias, back pain and myalgias.  Skin:  Negative for pallor and rash.  Allergic/Immunologic: Negative for environmental allergies.   Neurological:  Negative for dizziness, syncope and headaches.  Hematological:  Negative for adenopathy. Does not bruise/bleed easily.  Psychiatric/Behavioral:  Negative for decreased concentration and dysphoric mood. The patient is not nervous/anxious.        Objective:   Physical Exam Constitutional:      General: She is not in acute distress.    Appearance: Normal appearance. She is well-developed. She is obese. She is not ill-appearing or diaphoretic.  HENT:     Head: Normocephalic and  atraumatic.     Right Ear: Tympanic membrane, ear canal and external ear normal.     Left Ear: Tympanic membrane, ear canal and external ear normal.     Nose: Nose normal. No congestion.     Mouth/Throat:     Mouth: Mucous membranes are moist.     Pharynx: Oropharynx is clear. No posterior oropharyngeal erythema.  Eyes:     General: No scleral icterus.    Extraocular Movements: Extraocular movements intact.     Conjunctiva/sclera: Conjunctivae normal.     Pupils: Pupils are equal, round, and reactive to light.  Neck:     Thyroid: No thyromegaly.     Vascular: No carotid bruit or JVD.  Cardiovascular:     Rate and Rhythm: Normal rate and regular rhythm.     Pulses: Normal pulses.     Heart sounds: Normal heart sounds.     No gallop.  Pulmonary:     Effort: Pulmonary effort is normal. No respiratory distress.     Breath sounds: Normal breath sounds. No wheezing.     Comments: Good air exch Chest:     Chest wall: No tenderness.  Abdominal:     General: Bowel sounds are normal. There is no distension or abdominal bruit.     Palpations: Abdomen is soft. There is no mass.     Tenderness: There is no abdominal tenderness.     Hernia: No hernia is present.  Genitourinary:    Comments: Breast exam: No mass, nodules, thickening, tenderness, bulging, retraction, inflamation, nipple discharge or skin changes noted.  No axillary or clavicular LA.     Musculoskeletal:        General: No tenderness.  Normal range of motion.     Cervical back: Normal range of motion and neck supple. No rigidity. No muscular tenderness.     Right lower leg: No edema.     Left lower leg: No edema.     Comments: No kyphosis   Lymphadenopathy:     Cervical: No cervical adenopathy.  Skin:    General: Skin is warm and dry.     Coloration: Skin is not pale.     Findings: No erythema or rash.     Comments: Solar lentigines diffusely   Neurological:     Mental Status: She is alert. Mental status is at baseline.     Cranial Nerves: No cranial nerve deficit.     Motor: No abnormal muscle tone.     Coordination: Coordination normal.     Gait: Gait normal.     Deep Tendon Reflexes: Reflexes are normal and symmetric. Reflexes normal.  Psychiatric:        Mood and Affect: Mood normal.        Cognition and Memory: Cognition and memory normal.           Assessment & Plan:   Problem List Items Addressed This Visit       Other   B12 deficiency    Lab Results  Component Value Date   VITAMINB12 748 03/03/2022  Well controlled with oral supplementation  In setting of low dose ppi use        Current use of proton pump inhibitor    Omeprazole 10 mg daily for GERD Taking supplemental D, B12 and mag   Lab Results  Component Value Date   VITAMINB12 748 03/03/2022         Mild anemia    Last cbc and iron and ferritin nl  Sees gi and utd colonsocopy  Her iron % sat is low (per pt) - will continue f/u with GI      Relevant Medications   Ferrous Sulfate (IRON) 325 (65 Fe) MG TABS   Obesity with body mass index 30 or greater    Discussed how this problem influences overall health and the risks it imposes  a1c is 6.5  Reviewed plan for weight loss with lower calorie diet (via better food choices and also portion control or program like weight watchers) and exercise building up to or more than 30 minutes 5 days per week including some aerobic activity         Prediabetes    a1c up to  6.5 Strong fam history of DM2 Strongly enc low glycemic diet and wt loss F/u 3-6 mo for visit and re check       Routine general medical examination at a health care facility - Primary    Reviewed health habits including diet and exercise and skin cancer prevention Reviewed appropriate screening tests for age  Also reviewed health mt list, fam hx and immunization status , as well as social and family history   See HPI Labs reviewed  Flu shot given  shingrix is utd  Mammogram utd 11/2021 Pap utd 07/2017  Sees gyn  Colonoscopy 10/2021 utd Disc goals for low glycemic diet and wt loss       Relevant Orders   Flu Vaccine QUAD 6+ mos PF IM (Fluarix Quad PF) (Completed)   Other Visit Diagnoses     Need for influenza vaccination       Relevant Orders   Flu Vaccine QUAD 6+ mos PF IM (Fluarix Quad PF) (Completed)

## 2022-03-10 NOTE — Patient Instructions (Addendum)
You are borderline for diabetes  Eating well and weight loss can slow the progression   Try to get most of your carbohydrates from produce (with the exception of white potatoes)  Eat less bread/pasta/rice/snack foods/cereals/sweets and other items from the middle of the grocery store (processed carbs)  Flu shot today   Follow up in 3-6 months for blood sugar

## 2022-03-10 NOTE — Assessment & Plan Note (Signed)
Reviewed health habits including diet and exercise and skin cancer prevention Reviewed appropriate screening tests for age  Also reviewed health mt list, fam hx and immunization status , as well as social and family history   See HPI Labs reviewed  Flu shot given  shingrix is utd  Mammogram utd 11/2021 Pap utd 07/2017  Sees gyn  Colonoscopy 10/2021 utd Disc goals for low glycemic diet and wt loss

## 2022-03-10 NOTE — Assessment & Plan Note (Signed)
Lab Results  Component Value Date   VITAMINB12 748 03/03/2022   Well controlled with oral supplementation  In setting of low dose ppi use

## 2022-03-10 NOTE — Assessment & Plan Note (Signed)
Discussed how this problem influences overall health and the risks it imposes  a1c is 6.5  Reviewed plan for weight loss with lower calorie diet (via better food choices and also portion control or program like weight watchers) and exercise building up to or more than 30 minutes 5 days per week including some aerobic activity

## 2022-03-10 NOTE — Assessment & Plan Note (Signed)
Last cbc and iron and ferritin nl  Sees gi and utd colonsocopy  Her iron % sat is low (per pt) - will continue f/u with GI

## 2022-03-10 NOTE — Assessment & Plan Note (Signed)
Omeprazole 10 mg daily for GERD Taking supplemental D, B12 and mag   Lab Results  Component Value Date   VITAMINB12 748 03/03/2022

## 2022-03-29 DIAGNOSIS — D509 Iron deficiency anemia, unspecified: Secondary | ICD-10-CM | POA: Diagnosis not present

## 2022-06-11 ENCOUNTER — Encounter: Payer: Self-pay | Admitting: Internal Medicine

## 2022-06-11 ENCOUNTER — Ambulatory Visit (INDEPENDENT_AMBULATORY_CARE_PROVIDER_SITE_OTHER): Payer: BC Managed Care – PPO | Admitting: Internal Medicine

## 2022-06-11 ENCOUNTER — Telehealth: Payer: Self-pay | Admitting: Family Medicine

## 2022-06-11 VITALS — BP 120/76 | HR 98 | Temp 98.1°F | Ht 64.25 in | Wt 204.0 lb

## 2022-06-11 DIAGNOSIS — J029 Acute pharyngitis, unspecified: Secondary | ICD-10-CM

## 2022-06-11 DIAGNOSIS — J020 Streptococcal pharyngitis: Secondary | ICD-10-CM

## 2022-06-11 LAB — POCT RAPID STREP A (OFFICE): Rapid Strep A Screen: POSITIVE — AB

## 2022-06-11 MED ORDER — PENICILLIN V POTASSIUM 500 MG PO TABS: 500.0000 mg | ORAL_TABLET | Freq: Three times a day (TID) | ORAL | 0 refills | Status: DC

## 2022-06-11 NOTE — Assessment & Plan Note (Signed)
Will treat with PCN veek 500 tid x 10 days Discussed analgesics ?chloraseptic, salt water gargles

## 2022-06-11 NOTE — Addendum Note (Signed)
Addended by: Pilar Grammes on: 06/11/2022 08:12 AM   Modules accepted: Orders

## 2022-06-11 NOTE — Progress Notes (Signed)
Subjective:    Patient ID: Nicole Johnson, female    DOB: 1960-12-11, 62 y.o.   MRN: [id number]  HPI Here due to a persistent respiratory illness  Did have flu like illness around Christmas---then cough which lasted Better for 2 days---then got this  Bad sore throat 2 days ago Trouble swallowing and blowing nose No fever Some ear pain "I can't cough"---but feels there is "junk" in there No real SOB  Hot drinks don't soothe Using cough drops--also not helping Tried cold pill yesterday--coricidin--didn't help  Current Outpatient Medications on File Prior to Visit  Medication Sig Dispense Refill   Calcium Citrate-Vitamin D (CALCITRATE/VITAMIN D PO) Calcitrate-Vitamin D     Ferrous Sulfate (IRON) 325 (65 Fe) MG TABS Take 1 tablet by mouth daily.     Loratadine (ALAVERT PO) Take by mouth as needed.     MAGNESIUM PO Take 1 tablet by mouth daily.     multivitamin-lutein (OCUVITE-LUTEIN) CAPS capsule Take 1 capsule by mouth daily.     omeprazole (PRILOSEC) 20 MG capsule TAKE 1 CAPSULE(20 MG) BY MOUTH DAILY (Patient taking differently: Take 10 mg by mouth daily.) 90 capsule 1   Pyridoxine HCl (VITAMIN B6 PO) Vitamin B6     TURMERIC PO Take 2 capsules by mouth daily.     vitamin B-12 (CYANOCOBALAMIN) 500 MCG tablet Take 500 mcg by mouth daily.     No current facility-administered medications on file prior to visit.    Allergies  Allergen Reactions   Codeine Other (See Comments)   Meloxicam Nausea Only   Tramadol Nausea Only    Past Medical History:  Diagnosis Date   Arthritis    GERD (gastroesophageal reflux disease)     Past Surgical History:  Procedure Laterality Date   LAPAROSCOPIC ABDOMINAL EXPLORATION      Family History  Problem Relation Age of Onset   Diabetes Mother    Stroke Mother    Cancer Father    Diabetes Father    Diabetes Sister    Breast cancer Sister    Diabetes Brother    Heart disease Maternal Grandmother    Cancer Maternal Grandfather     Heart disease Maternal Grandfather     Social History   Socioeconomic History   Marital status: Married    Spouse name: Not on file   Number of children: Not on file   Years of education: Not on file   Highest education level: Not on file  Occupational History   Not on file  Tobacco Use   Smoking status: Never   Smokeless tobacco: Never  Substance and Sexual Activity   Alcohol use: Yes    Comment: occ   Drug use: Never   Sexual activity: Yes    Partners: Male  Other Topics Concern   Not on file  Social History Narrative   Not on file   Social Determinants of Health   Financial Resource Strain: Not on file  Food Insecurity: Not on file  Transportation Needs: Not on file  Physical Activity: Not on file  Stress: Not on file  Social Connections: Not on file  Intimate Partner Violence: Not on file   Review of Systems No N/V Able to eat--but has to be careful. Appetite gone No rash Was exposed to ill young girl a few days ago    Objective:   Physical Exam Constitutional:      Appearance: Normal appearance.  HENT:     Right Ear: Tympanic membrane and ear  canal normal.     Left Ear: Tympanic membrane and ear canal normal.     Mouth/Throat:     Comments: No tonsillar or peritonsillar swelling but moderate pharyngeal injection and exudates Neck:     Comments: Mildly tender bilateral anterior cervical nodes Pulmonary:     Effort: Pulmonary effort is normal.     Breath sounds: Normal breath sounds. No wheezing or rales.  Musculoskeletal:     Cervical back: Neck supple.  Skin:    Findings: No rash.  Neurological:     Mental Status: She is alert.            Assessment & Plan:

## 2022-06-11 NOTE — Telephone Encounter (Signed)
Patient called in and stated that the [facility] is having computer issues. She stated to just send the medication in to [id number] - [place], [place].

## 2022-06-11 NOTE — Telephone Encounter (Signed)
I just sent it back in. The 1st transmission failed. I spoke to the pt.

## 2022-06-11 NOTE — Telephone Encounter (Signed)
Patient called and stated she seen Dr. Silvio Pate morning and he sent the medication penicillin v potassium (VEETID) 500 MG tablet   to the pharmacy but they don't have it yet and she was wondering when they will see it at the pharmacy.

## 2022-06-11 NOTE — Addendum Note (Signed)
Addended by: Pilar Grammes on: 06/11/2022 10:06 AM   Modules accepted: Orders

## 2022-06-11 NOTE — Telephone Encounter (Signed)
I have sent it to [facility]

## 2022-06-11 NOTE — Addendum Note (Signed)
Addended by: Pilar Grammes on: 06/11/2022 10:55 AM   Modules accepted: Orders

## 2022-09-09 ENCOUNTER — Encounter: Payer: Self-pay | Admitting: Family Medicine

## 2022-09-09 ENCOUNTER — Ambulatory Visit (INDEPENDENT_AMBULATORY_CARE_PROVIDER_SITE_OTHER): Payer: BC Managed Care – PPO | Admitting: Family Medicine

## 2022-09-09 VITALS — BP 126/78 | HR 78 | Temp 97.3°F | Ht 64.25 in | Wt 206.5 lb

## 2022-09-09 DIAGNOSIS — J301 Allergic rhinitis due to pollen: Secondary | ICD-10-CM | POA: Diagnosis not present

## 2022-09-09 DIAGNOSIS — Z6835 Body mass index (BMI) 35.0-35.9, adult: Secondary | ICD-10-CM | POA: Diagnosis not present

## 2022-09-09 DIAGNOSIS — R7303 Prediabetes: Secondary | ICD-10-CM

## 2022-09-09 DIAGNOSIS — J309 Allergic rhinitis, unspecified: Secondary | ICD-10-CM | POA: Insufficient documentation

## 2022-09-09 LAB — POCT GLYCOSYLATED HEMOGLOBIN (HGB A1C): Hemoglobin A1C: 6.5 % — AB (ref 4.0–5.6)

## 2022-09-09 MED ORDER — FLUTICASONE PROPIONATE 50 MCG/ACT NA SUSP: 2.0000 | Freq: Every day | NASAL | 6 refills | Status: DC

## 2022-09-09 NOTE — Assessment & Plan Note (Signed)
Lab Results  Component Value Date   HGBA1C 6.5 (A) 09/09/2022   No change  She did change her diet briefly then went back to old habits disc imp of low glycemic diet and wt loss to prevent DM2 - rev sources of lean protein and enc produce instead of processed foods  Also disc opt for exercise on days when work is not strenuous  Wt loss is the goal   F/u 6 mo (pt prefer this to 3 mo) Has the equip to check glucose as well

## 2022-09-09 NOTE — Assessment & Plan Note (Signed)
Struggling in tree pollen season   Inst to continue loratadine  Add flonase ns daily in season  Allergen avoidance when able   Update if not starting to improve in a week or if worsening

## 2022-09-09 NOTE — Patient Instructions (Addendum)
Tp [revemt full blown diabetes  Try to get most of your carbohydrates from produce (with the exception of white potatoes)  Eat less bread/pasta/rice/snack foods/cereals/sweets and other items from the middle of the grocery store (processed carbs)   Protien sources Nuts and nut butter  Eggs  Dried beans  Meat -lean Fish  Dairy products - lean  Alternative dairy protein  Soy products like tofu   Work up to 30 minutes of exercise daily at least 5 days per week   Muscle building exercise really helps Add some strength training to your routine, this is important for bone and brain health and can reduce your risk of falls and help your body use insulin properly and regulate weight  Light weights, exercise bands , and internet videos are a good way to start  Yoga (chair or regular), machines , floor exercises or a gym with machines are also good options    Continue your generic claritin (loratadine)  Add flonase nasal spray daily

## 2022-09-09 NOTE — Progress Notes (Signed)
Subjective:    Patient ID: Nicole Johnson, female    DOB: 1961-04-16, 62 y.o.   MRN: [id number]  HPI Pt presents for f/u of prediabetes and chronic medical problems  Wt Readings from Last 3 Encounters:  09/09/22 206 lb 8 oz (93.7 kg)  06/11/22 204 lb (92.5 kg)  03/10/22 209 lb 6 oz (95 kg)   35.17 kg/m  Vitals:   09/09/22 0756  BP: 126/78  Pulse: 78  Temp: (!) 97.3 F (36.3 C)  SpO2: 97%   Feeling ok overall  Pollen is bothering her   She took allegra for a while (caused some nausea)   Now taking loratadine  Uses allergy nasal spray- ? 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     Today no change  Lab Results  Component Value Date   HGBA1C 6.5 (A) 09/09/2022     Lab Results  Component Value Date   WBC 4.4 03/03/2022   HGB 12.2 03/03/2022   HCT 36.7 03/03/2022   MCV 84.4 03/03/2022   PLT 207.0 03/03/2022   Lab Results  Component Value Date   IRON 49 03/03/2022   FERRITIN 29.3 03/03/2022   Patient Active Problem List   Diagnosis Date Noted   Severe obesity (BMI 35.0-35.9 with comorbidity) 09/09/2022   Allergic rhinitis 09/09/2022   Mild anemia 02/10/2021   Routine general medical examination at a health care facility 01/01/2021   Muscle cramps 07/10/2020   Current use of proton pump inhibitor 07/10/2020   B12 deficiency 07/10/2020   Prediabetes 12/26/2019   Gastroesophageal reflux disease  12/02/2017   Obesity with body mass index 30 or greater 07/26/2016   Stress incontinence of urine 07/26/2016   Past Medical History:  Diagnosis Date   Arthritis    GERD (gastroesophageal reflux disease)    Past Surgical History:  Procedure Laterality Date   LAPAROSCOPIC ABDOMINAL EXPLORATION     Social History   Tobacco Use   Smoking status: Never   Smokeless tobacco: Never  Substance Use Topics   Alcohol use: Yes    Comment: occ   Drug use: Never   Family History  Problem Relation Age of Onset   Diabetes Mother    Stroke Mother    Cancer Father    Diabetes Father    Diabetes Sister    Breast cancer Sister    Diabetes Brother    Heart disease Maternal Grandmother    Cancer Maternal Grandfather    Heart disease Maternal Grandfather    Allergies  Allergen Reactions   Codeine Other (See Comments)   Meloxicam Nausea Only   Tramadol Nausea Only   Current Outpatient Medications on File Prior to Visit  Medication Sig Dispense Refill   Calcium Citrate-Vitamin D (CALCITRATE/VITAMIN D PO) Calcitrate-Vitamin D     Ferrous Sulfate (IRON) 325 (65 Fe) MG TABS Take  1 tablet by mouth daily.     Loratadine (ALAVERT PO) Take by mouth as needed.     MAGNESIUM PO Take 1 tablet by mouth daily.     multivitamin-lutein (OCUVITE-LUTEIN) CAPS capsule Take 1 capsule by mouth daily.     omeprazole (PRILOSEC) 20 MG capsule TAKE 1 CAPSULE(20 MG) BY MOUTH DAILY (Patient taking differently: Take 10 mg by mouth daily.) 90 capsule 1   Pyridoxine HCl (VITAMIN B6 PO) Vitamin B6     TURMERIC PO Take 2 capsules by mouth daily.     vitamin B-12 (CYANOCOBALAMIN) 500 MCG tablet Take 500 mcg by mouth daily.     No current facility-administered medications on file prior to visit.     Review of Systems  Constitutional:  Negative for activity change, appetite change, fatigue, fever and unexpected weight change.  HENT:  Negative for congestion, ear pain, rhinorrhea, sinus pressure and sore throat.    Eyes:  Negative for pain, redness and visual disturbance.  Respiratory:  Negative for cough, shortness of breath and wheezing.   Cardiovascular:  Negative for chest pain and palpitations.  Gastrointestinal:  Negative for abdominal pain, blood in stool, constipation and diarrhea.  Endocrine: Negative for polydipsia and polyuria.  Genitourinary:  Negative for dysuria, frequency and urgency.  Musculoskeletal:  Negative for arthralgias, back pain and myalgias.  Skin:  Negative for pallor and rash.  Allergic/Immunologic: Negative for environmental allergies.  Neurological:  Negative for dizziness, syncope and headaches.  Hematological:  Negative for adenopathy. Does not bruise/bleed easily.  Psychiatric/Behavioral:  Negative for decreased concentration and dysphoric mood. The patient is not nervous/anxious.        Objective:   Physical Exam Constitutional:      General: She is not in acute distress.    Appearance: Normal appearance. She is well-developed. She is obese. She is not ill-appearing or diaphoretic.  HENT:     Head: Normocephalic and atraumatic.     Right Ear: Tympanic membrane and ear canal normal.     Left Ear: Tympanic membrane and ear canal normal.     Ears:     Comments: TMs are dull    Nose: Rhinorrhea present.     Mouth/Throat:     Mouth: Mucous membranes are moist.     Comments: Clear pnd Eyes:     General:        Right eye: No discharge.        Left eye: No discharge.     Conjunctiva/sclera: Conjunctivae normal.     Pupils: Pupils are equal, round, and reactive to light.  Neck:     Thyroid: No thyromegaly.     Vascular: No carotid bruit or JVD.  Cardiovascular:     Rate and Rhythm: Normal rate and regular rhythm.     Heart sounds: Normal heart sounds.     No gallop.  Pulmonary:     Effort: Pulmonary effort is normal. No respiratory distress.     Breath sounds: Normal breath sounds. No stridor. No wheezing, rhonchi or rales.  Abdominal:     General: There  is no distension or abdominal bruit.     Palpations: Abdomen is soft.  Musculoskeletal:     Cervical back: Normal range of motion and neck supple.     Right lower leg: No edema.     Left lower leg: No edema.  Lymphadenopathy:     Cervical: No cervical adenopathy.  Skin:    General: Skin is warm and dry.  Coloration: Skin is not pale.     Findings: No rash.  Neurological:     Mental Status: She is alert.     Coordination: Coordination normal.     Deep Tendon Reflexes: Reflexes are normal and symmetric. Reflexes normal.  Psychiatric:        Mood and Affect: Mood normal.           Assessment & Plan:   Problem List Items Addressed This Visit       Respiratory   Allergic rhinitis    Struggling in tree pollen season   Inst to continue loratadine  Add flonase ns daily in season  Allergen avoidance when able   Update if not starting to improve in a week or if worsening          Other   Prediabetes - Primary    Lab Results  Component Value Date   HGBA1C 6.5 (A) 09/09/2022  No change  She did change her diet briefly then went back to old habits disc imp of low glycemic diet and wt loss to prevent DM2 - rev sources of lean protein and enc produce instead of processed foods  Also disc opt for exercise on days when work is not strenuous  Wt loss is the goal   F/u 6 mo (pt prefer this to 3 mo) Has the equip to check glucose as well        Relevant Orders   POCT HgB A1C (Completed)   Severe obesity (BMI 35.0-35.9 with comorbidity)    Is prediabetic   Discussed how this problem influences overall health and the risks it imposes  Reviewed plan for weight loss with lower calorie diet (via better food choices and also portion control or program like weight watchers) and exercise building up to or more than 30 minutes 5 days per week including some aerobic activity   Plan for low glycemic diet - making small changes weekly  Also strength building exercise

## 2022-09-09 NOTE — Assessment & Plan Note (Signed)
Is prediabetic   Discussed how this problem influences overall health and the risks it imposes  Reviewed plan for weight loss with lower calorie diet (via better food choices and also portion control or program like weight watchers) and exercise building up to or more than 30 minutes 5 days per week including some aerobic activity   Plan for low glycemic diet - making small changes weekly  Also strength building exercise

## 2022-10-05 ENCOUNTER — Telehealth: Payer: Self-pay

## 2022-10-05 NOTE — Telephone Encounter (Signed)
Patient Advocate Encounter  Received a fax from [facility] regarding Prior Authorization for Fluticasone.   Authorization has been DENIED due to    Determination letter attached to patient chart

## 2022-10-06 NOTE — Telephone Encounter (Signed)
Sent mychart letting pt know  ?

## 2022-10-06 NOTE — Telephone Encounter (Signed)
Please let her know she will have to buy otc since not covered by insurance  Store brand may be cheaper

## 2022-11-15 ENCOUNTER — Other Ambulatory Visit: Payer: Self-pay | Admitting: Obstetrics and Gynecology

## 2022-11-15 DIAGNOSIS — N6310 Unspecified lump in the right breast, unspecified quadrant: Secondary | ICD-10-CM

## 2022-12-07 ENCOUNTER — Ambulatory Visit: Payer: BC Managed Care – PPO | Admitting: Internal Medicine

## 2022-12-07 ENCOUNTER — Encounter: Payer: Self-pay | Admitting: Internal Medicine

## 2022-12-07 VITALS — BP 110/66 | HR 78 | Temp 97.3°F | Ht 64.25 in | Wt 197.0 lb

## 2022-12-07 DIAGNOSIS — L299 Pruritus, unspecified: Secondary | ICD-10-CM | POA: Diagnosis not present

## 2022-12-07 LAB — CBC
HCT: 35.8 % — ABNORMAL LOW (ref 36.0–46.0)
Hemoglobin: 11.5 g/dL — ABNORMAL LOW (ref 12.0–15.0)
MCHC: 32.1 g/dL (ref 30.0–36.0)
MCV: 84.6 fl (ref 78.0–100.0)
Platelets: 109 10*3/uL — ABNORMAL LOW (ref 150.0–400.0)
RBC: 4.24 Mil/uL (ref 3.87–5.11)
RDW: 16 % — ABNORMAL HIGH (ref 11.5–15.5)
WBC: 2.8 10*3/uL — ABNORMAL LOW (ref 4.0–10.5)

## 2022-12-07 LAB — COMPREHENSIVE METABOLIC PANEL
ALT: 247 U/L — ABNORMAL HIGH (ref 0–35)
AST: 152 U/L — ABNORMAL HIGH (ref 0–37)
Albumin: 4.2 g/dL (ref 3.5–5.2)
Alkaline Phosphatase: 416 U/L — ABNORMAL HIGH (ref 39–117)
BUN: 19 mg/dL (ref 6–23)
CO2: 28 mEq/L (ref 19–32)
Calcium: 9.9 mg/dL (ref 8.4–10.5)
Chloride: 99 mEq/L (ref 96–112)
Creatinine, Ser: 0.77 mg/dL (ref 0.40–1.20)
GFR: 83.46 mL/min (ref >60.00)
Glucose, Bld: 165 mg/dL — ABNORMAL HIGH (ref 70–99)
Potassium: 3.8 mEq/L (ref 3.5–5.1)
Sodium: 134 mEq/L — ABNORMAL LOW (ref 135–145)
Total Bilirubin: 4 mg/dL — ABNORMAL HIGH (ref 0.2–1.2)
Total Protein: 7.2 g/dL (ref 6.0–8.3)

## 2022-12-07 LAB — HEMOGLOBIN A1C: Hgb A1c MFr Bld: 6.5 % (ref 4.6–6.5)

## 2022-12-07 LAB — SEDIMENTATION RATE: Sed Rate: 36 mm/hr — ABNORMAL HIGH (ref 0–30)

## 2022-12-07 MED ORDER — TRIAMCINOLONE ACETONIDE 0.1 % EX CREA: 1.0000 | TOPICAL_CREAM | Freq: Two times a day (BID) | CUTANEOUS | 1 refills | Status: DC | PRN

## 2022-12-07 MED ORDER — PREDNISONE 20 MG PO TABS: 40.0000 mg | ORAL_TABLET | Freq: Every day | ORAL | 0 refills | Status: DC

## 2022-12-07 NOTE — Assessment & Plan Note (Signed)
No clear etiology---though had used CBD before this stopped Some excoriations--will give prednisone for a few days Sig ecchymoses--will check labs (especially LFTs) Try cetirizine at bedtime

## 2022-12-07 NOTE — Progress Notes (Signed)
Subjective:    Patient ID: Nicole Johnson, female    DOB: Feb 19, 1961, 62 y.o.   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     fluticasone (FLONASE) 50 MCG/ACT nasal spray Place 2 sprays into both nostrils daily. 16 g 6   Loratadine (ALAVERT PO) Take by mouth as needed.     MAGNESIUM PO Take 1 tablet by mouth daily.     multivitamin-lutein (OCUVITE-LUTEIN) CAPS capsule Take 1 capsule by mouth daily.     omeprazole (PRILOSEC) 20 MG capsule TAKE 1 CAPSULE(20 MG) BY MOUTH DAILY (Patient taking differently: Take 10 mg by mouth daily.) 90 capsule 1   Pyridoxine HCl (VITAMIN B6 PO) Vitamin B6     TURMERIC PO Take 2 capsules by mouth daily.     vitamin B-12 (CYANOCOBALAMIN) 500 MCG tablet Take 500 mcg by mouth daily.     No current facility-administered medications on file prior to visit.    Allergies  Allergen Reactions   Codeine Other (See Comments)   Meloxicam Nausea Only   Tramadol Nausea Only    Past Medical History:  Diagnosis Date   Arthritis    GERD (gastroesophageal reflux disease)     Past Surgical History:  Procedure Laterality Date   LAPAROSCOPIC ABDOMINAL EXPLORATION      Family History  Problem Relation Age of Onset   Diabetes Mother    Stroke Mother    Cancer Father    Diabetes Father    Diabetes Sister    Breast cancer Sister    Diabetes Brother    Heart disease Maternal Grandmother    Cancer Maternal  Grandfather    Heart disease Maternal Grandfather     Social History   Socioeconomic History   Marital status: Married    Spouse name: Not on file   Number of children: Not on file   Years of education: Not on file   Highest education level: Not on file  Occupational History   Not on file  Tobacco Use   Smoking status: Never   Smokeless tobacco: Never  Substance and Sexual Activity   Alcohol use: Yes    Comment: occ   Drug use: Never   Sexual activity: Yes    Partners: Male  Other Topics Concern   Not on file  Social History Narrative   Not on file   Social Determinants of Health   Financial Resource Strain: Not on file  Food Insecurity: Not on file  Transportation Needs: Not on file  Physical Activity: Not on file  Stress: Not on file  Social Connections: Not on file  Intimate Partner Violence: Not on file   Review of Systems Appetite is off Weight down some--has been trying No fever No swollen glands      Objective:   Physical Exam Constitutional:      Appearance: Normal appearance.  Cardiovascular:     Rate and Rhythm:  Normal rate and regular rhythm.     Heart sounds: No murmur heard.    No gallop.  Pulmonary:     Effort: Pulmonary effort is normal.     Breath sounds: No wheezing or rales.  Abdominal:     Palpations: Abdomen is soft.     Tenderness: There is no abdominal tenderness.     Comments: No HSM  Musculoskeletal:     Cervical back: Neck supple.     Right lower leg: No edema.     Left lower leg: No edema.  Lymphadenopathy:     Cervical: No cervical adenopathy.  Skin:    Comments: Some excoriations by shoulders Sig ecchymoses around lower trunk and back  Neurological:     Mental Status: She is alert.            Assessment & Plan:

## 2022-12-07 NOTE — Patient Instructions (Signed)
Try cetirizine 10mg  at bedtime to see if that helps the itching

## 2022-12-08 ENCOUNTER — Other Ambulatory Visit: Payer: Self-pay | Admitting: Internal Medicine

## 2022-12-08 DIAGNOSIS — R7989 Other specified abnormal findings of blood chemistry: Secondary | ICD-10-CM

## 2022-12-09 ENCOUNTER — Other Ambulatory Visit: Payer: Self-pay | Admitting: Internal Medicine

## 2022-12-09 DIAGNOSIS — K831 Obstruction of bile duct: Secondary | ICD-10-CM

## 2022-12-10 ENCOUNTER — Other Ambulatory Visit: Payer: Self-pay | Admitting: Internal Medicine

## 2022-12-10 ENCOUNTER — Ambulatory Visit
Admission: RE | Admit: 2022-12-10 | Discharge: 2022-12-10 | Disposition: A | Discharge status: Home / Self Care | Payer: BC Managed Care – PPO | Source: Ambulatory Visit | Attending: Internal Medicine | Admitting: Internal Medicine

## 2022-12-10 DIAGNOSIS — K838 Other specified diseases of biliary tract: Secondary | ICD-10-CM | POA: Diagnosis not present

## 2022-12-10 DIAGNOSIS — K760 Fatty (change of) liver, not elsewhere classified: Secondary | ICD-10-CM

## 2022-12-10 DIAGNOSIS — R7989 Other specified abnormal findings of blood chemistry: Secondary | ICD-10-CM

## 2022-12-10 DIAGNOSIS — K769 Liver disease, unspecified: Secondary | ICD-10-CM | POA: Diagnosis not present

## 2022-12-13 ENCOUNTER — Telehealth: Payer: Self-pay | Admitting: Family Medicine

## 2022-12-13 NOTE — Telephone Encounter (Signed)
Patient would like to know if she cold possibly see Dr Jeani Hawking who did her colonoscopy and endoscopy last year for the issue that she is having now?

## 2022-12-13 NOTE — Telephone Encounter (Signed)
[facility] has been trying to reach the patient to schedule.   They have left a message and sent her a Mychart message. No call back or message back on mychart.   They are waiting on a call back to schedule

## 2022-12-13 NOTE — Telephone Encounter (Signed)
Patient was referred to a liver (hepatic)doctor.She said that she called them to make an appointment,and they said they wont be ale to get her in until October. She would like to know if theres possibly anything else that could be done for her to possibly be seen earlier or referred somewhere else?

## 2022-12-14 ENCOUNTER — Telehealth: Payer: Self-pay | Admitting: Internal Medicine

## 2022-12-14 ENCOUNTER — Encounter: Payer: Self-pay | Admitting: Internal Medicine

## 2022-12-14 NOTE — Telephone Encounter (Signed)
Good morning Dr. Leonides Schanz  The following patient was referred to [place] for Hepatic steatosis. She is requesting you to be her provider. She recently had a colonoscopy/EGD in 2023 with Dr. Elnoria Howard and is not continuing care because she wants to go with who her doctor referred her to. Records are available on Epic. Please review and advise of scheduling. Thank you.

## 2022-12-14 NOTE — Telephone Encounter (Signed)
Thank you :)

## 2022-12-14 NOTE — Telephone Encounter (Signed)
I sent her a MyChart to contact them

## 2022-12-15 DIAGNOSIS — R1011 Right upper quadrant pain: Secondary | ICD-10-CM | POA: Diagnosis not present

## 2022-12-15 DIAGNOSIS — D696 Thrombocytopenia, unspecified: Secondary | ICD-10-CM | POA: Diagnosis not present

## 2022-12-15 DIAGNOSIS — R748 Abnormal levels of other serum enzymes: Secondary | ICD-10-CM | POA: Diagnosis not present

## 2022-12-15 DIAGNOSIS — L299 Pruritus, unspecified: Secondary | ICD-10-CM | POA: Diagnosis not present

## 2022-12-16 ENCOUNTER — Encounter (HOSPITAL_COMMUNITY): Payer: Self-pay

## 2022-12-16 ENCOUNTER — Inpatient Hospital Stay (HOSPITAL_COMMUNITY)
Admit: 2022-12-16 | Discharge: 2022-12-18 | DRG: 446 | Disposition: A | Discharge status: Home / Self Care | Payer: BC Managed Care – PPO | Source: Ambulatory Visit | Attending: Gastroenterology | Admitting: Gastroenterology

## 2022-12-16 ENCOUNTER — Other Ambulatory Visit: Payer: Self-pay

## 2022-12-16 ENCOUNTER — Inpatient Hospital Stay (HOSPITAL_COMMUNITY): Payer: BC Managed Care – PPO

## 2022-12-16 DIAGNOSIS — C250 Malignant neoplasm of head of pancreas: Secondary | ICD-10-CM | POA: Diagnosis not present

## 2022-12-16 DIAGNOSIS — C240 Malignant neoplasm of extrahepatic bile duct: Secondary | ICD-10-CM | POA: Diagnosis not present

## 2022-12-16 DIAGNOSIS — R932 Abnormal findings on diagnostic imaging of liver and biliary tract: Secondary | ICD-10-CM | POA: Diagnosis not present

## 2022-12-16 DIAGNOSIS — Z8249 Family history of ischemic heart disease and other diseases of the circulatory system: Secondary | ICD-10-CM

## 2022-12-16 DIAGNOSIS — Z823 Family history of stroke: Secondary | ICD-10-CM

## 2022-12-16 DIAGNOSIS — R17 Unspecified jaundice: Secondary | ICD-10-CM | POA: Diagnosis not present

## 2022-12-16 DIAGNOSIS — K828 Other specified diseases of gallbladder: Secondary | ICD-10-CM | POA: Diagnosis not present

## 2022-12-16 DIAGNOSIS — K838 Other specified diseases of biliary tract: Secondary | ICD-10-CM | POA: Diagnosis not present

## 2022-12-16 DIAGNOSIS — Q446 Cystic disease of liver: Secondary | ICD-10-CM | POA: Diagnosis not present

## 2022-12-16 DIAGNOSIS — L299 Pruritus, unspecified: Secondary | ICD-10-CM | POA: Diagnosis not present

## 2022-12-16 DIAGNOSIS — Z833 Family history of diabetes mellitus: Secondary | ICD-10-CM | POA: Diagnosis not present

## 2022-12-16 DIAGNOSIS — R748 Abnormal levels of other serum enzymes: Secondary | ICD-10-CM | POA: Diagnosis not present

## 2022-12-16 DIAGNOSIS — K219 Gastro-esophageal reflux disease without esophagitis: Secondary | ICD-10-CM | POA: Diagnosis not present

## 2022-12-16 DIAGNOSIS — Z885 Allergy status to narcotic agent status: Secondary | ICD-10-CM | POA: Diagnosis not present

## 2022-12-16 DIAGNOSIS — K831 Obstruction of bile duct: Principal | ICD-10-CM | POA: Diagnosis present

## 2022-12-16 DIAGNOSIS — R933 Abnormal findings on diagnostic imaging of other parts of digestive tract: Secondary | ICD-10-CM | POA: Diagnosis not present

## 2022-12-16 DIAGNOSIS — K8689 Other specified diseases of pancreas: Secondary | ICD-10-CM | POA: Diagnosis present

## 2022-12-16 HISTORY — DX: Nausea with vomiting, unspecified: R11.2

## 2022-12-16 HISTORY — DX: Nausea with vomiting, unspecified: Z98.890

## 2022-12-16 LAB — COMPREHENSIVE METABOLIC PANEL
ALT: 608 U/L — ABNORMAL HIGH (ref 0–44)
AST: 336 U/L — ABNORMAL HIGH (ref 15–41)
Albumin: 3.7 g/dL (ref 3.5–5.0)
Alkaline Phosphatase: 475 U/L — ABNORMAL HIGH (ref 38–126)
Anion gap: 10 (ref 5–15)
BUN: 13 mg/dL (ref 8–23)
CO2: 27 mmol/L (ref 22–32)
Calcium: 9.4 mg/dL (ref 8.9–10.3)
Chloride: 98 mmol/L (ref 98–111)
Creatinine, Ser: 0.48 mg/dL (ref 0.44–1.00)
GFR, Estimated: >60 mL/min (ref >60)
Glucose, Bld: 103 mg/dL — ABNORMAL HIGH (ref 70–99)
Potassium: 4 mmol/L (ref 3.5–5.1)
Sodium: 135 mmol/L (ref 135–145)
Total Bilirubin: 11.9 mg/dL — ABNORMAL HIGH (ref 0.3–1.2)
Total Protein: 6.9 g/dL (ref 6.5–8.1)

## 2022-12-16 LAB — CBC
HCT: 36 % (ref 36.0–46.0)
Hemoglobin: 11.3 g/dL — ABNORMAL LOW (ref 12.0–15.0)
MCH: 27.5 pg (ref 26.0–34.0)
MCHC: 31.4 g/dL (ref 30.0–36.0)
MCV: 87.6 fL (ref 80.0–100.0)
Platelets: 119 10*3/uL — ABNORMAL LOW (ref 150–400)
RBC: 4.11 MIL/uL (ref 3.87–5.11)
RDW: 15.8 % — ABNORMAL HIGH (ref 11.5–15.5)
WBC: 2.7 10*3/uL — ABNORMAL LOW (ref 4.0–10.5)
nRBC: 0 % (ref 0.0–0.2)

## 2022-12-16 MED ORDER — GADOBUTROL 1 MMOL/ML IV SOLN
9.0000 mL | Freq: Once | INTRAVENOUS | Status: AC | PRN
  Administered 2022-12-16: 9 mL via INTRAVENOUS

## 2022-12-16 MED ORDER — SODIUM CHLORIDE 0.9 % IV SOLN: INTRAVENOUS | Status: DC

## 2022-12-16 NOTE — H&P (Signed)
Nicole Johnson HPI: This is a 62 year old female without any significant PMH admitted for obstructive jaundice.  Her symptoms started a few weeks ago with pruritus.  She noticed that her urine darkened.  Blood work was obtained form her PCP and it showed that she had an obstructive pattern.  She complained about having RUQ abdominal pain with radiation of pain into the right portion of her back with PO intake.  As a result of her symptoms she avoided eating to avoid the pain, which resulted in a 10 lbs weight loss.  Her blood work on 12/07/2022 was as follows:  AST 152, ALT 247, AP 416, and TB 4.0.  Today's blood work shows that her liver enzymes increased:  AST 336, ALT 608, AP 475, and TB 11.9.  A RUQ U/S on 12/10/2022 showed that she had a 3 mm gallbladder polyp and a CBD measuring 7.7 mm.  Past Medical History:  Diagnosis Date   Arthritis    GERD (gastroesophageal reflux disease)     Past Surgical History:  Procedure Laterality Date   LAPAROSCOPIC ABDOMINAL EXPLORATION      Family History  Problem Relation Age of Onset   Diabetes Mother    Stroke Mother    Cancer Father    Diabetes Father    Diabetes Sister    Breast cancer Sister    Diabetes Brother    Heart disease Maternal Grandmother    Cancer Maternal Grandfather    Heart disease Maternal Grandfather     Social History:  reports that she has never smoked. She has never used smokeless tobacco. She reports current alcohol use. She reports that she does not use drugs.  Allergies:  Allergies  Allergen Reactions   Other Nausea Only and Other (See Comments)    Certain types of Anesthesia cause SEVERE NAUSEA   Codeine Nausea Only   Meloxicam Nausea Only   Tramadol Nausea Only    Medications: Scheduled: Continuous:  sodium chloride 75 mL/hr at 12/16/22 1530    Results for orders placed or performed during the hospital encounter of 12/16/22 (from the past 24 hour(s))  Comprehensive metabolic panel     Status: Abnormal    Collection Time: 12/16/22  2:57 PM  Result Value Ref Range   Sodium 135 135 - 145 mmol/L   Potassium 4.0 3.5 - 5.1 mmol/L   Chloride 98 98 - 111 mmol/L   CO2 27 22 - 32 mmol/L   Glucose, Bld 103 (H) 70 - 99 mg/dL   BUN 13 8 - 23 mg/dL   Creatinine, Ser 6.44 0.44 - 1.00 mg/dL   Calcium 9.4 8.9 - 03.4 mg/dL   Total Protein 6.9 6.5 - 8.1 g/dL   Albumin 3.7 3.5 - 5.0 g/dL   AST 742 (H) 15 - 41 U/L   ALT 608 (H) 0 - 44 U/L   Alkaline Phosphatase 475 (H) 38 - 126 U/L   Total Bilirubin 11.9 (H) 0.3 - 1.2 mg/dL   GFR, Estimated >59 >56 mL/min   Anion gap 10 5 - 15  CBC     Status: Abnormal   Collection Time: 12/16/22  2:57 PM  Result Value Ref Range   WBC 2.7 (L) 4.0 - 10.5 K/uL   RBC 4.11 3.87 - 5.11 MIL/uL   Hemoglobin 11.3 (L) 12.0 - 15.0 g/dL   HCT 38.7 56.4 - 33.2 %   MCV 87.6 80.0 - 100.0 fL   MCH 27.5 26.0 - 34.0 pg   MCHC  31.4 30.0 - 36.0 g/dL   RDW 16.1 (H) 09.6 - 04.5 %   Platelets 119 (L) 150 - 400 K/uL   nRBC 0.0 0.0 - 0.2 %     No results found.  ROS:  As stated above in the HPI otherwise negative.  Blood pressure (!) 134/91, pulse 66, temperature (!) 97.5 F (36.4 C), temperature source Oral, resp. rate 17, last menstrual period 01/11/2011, SpO2 100%.    PE: Gen: NAD, Alert and Oriented HEENT:  [place]/AT, EOMI, scleral icterus Lungs: CTA Bilaterally CV: RRR without M/G/R ABD: Soft, NTND, +BS Ext: No C/C/E  Assessment/Plan: 1) Biliary obstruction. 2) Jaundice. 3) Prurititis.   The new laboratory values are suggestive of a malignant etiology for her presentation.  Initially, when her TB was at 4.0 and she complained of RUQ pain with PO intake, it was suggestive of choledocholithiasis.  It is likely that the biliary ductal dilation worsened since the RUQ U/S.  Further evaluation with an MRCP is pending.  Plan: 1) MRCP today. 2) Follow labs. 3) ERCP with stenting tomorrow.  ? EUS with FNA, if time permits. 4) Check Ca19-9.  Nicole Johnson D 12/16/2022, 5:12 PM

## 2022-12-16 NOTE — H&P (View-Only) (Signed)
Dimas Chyle HPI: This is a 62 year old female without any significant PMH admitted for obstructive jaundice.  Her symptoms started a few weeks ago with pruritus.  She noticed that her urine darkened.  Blood work was obtained form her PCP and it showed that she had an obstructive pattern.  She complained about having RUQ abdominal pain with radiation of pain into the right portion of her back with PO intake.  As a result of her symptoms she avoided eating to avoid the pain, which resulted in a 10 lbs weight loss.  Her blood work on 12/07/2022 was as follows:  AST 152, ALT 247, AP 416, and TB 4.0.  Today's blood work shows that her liver enzymes increased:  AST 336, ALT 608, AP 475, and TB 11.9.  A RUQ U/S on 12/10/2022 showed that she had a 3 mm gallbladder polyp and a CBD measuring 7.7 mm.  Past Medical History:  Diagnosis Date   Arthritis    GERD (gastroesophageal reflux disease)     Past Surgical History:  Procedure Laterality Date   LAPAROSCOPIC ABDOMINAL EXPLORATION      Family History  Problem Relation Age of Onset   Diabetes Mother    Stroke Mother    Cancer Father    Diabetes Father    Diabetes Sister    Breast cancer Sister    Diabetes Brother    Heart disease Maternal Grandmother    Cancer Maternal Grandfather    Heart disease Maternal Grandfather     Social History:  reports that she has never smoked. She has never used smokeless tobacco. She reports current alcohol use. She reports that she does not use drugs.  Allergies:  Allergies  Allergen Reactions   Other Nausea Only and Other (See Comments)    Certain types of Anesthesia cause SEVERE NAUSEA   Codeine Nausea Only   Meloxicam Nausea Only   Tramadol Nausea Only    Medications: Scheduled: Continuous:  sodium chloride 75 mL/hr at 12/16/22 1530    Results for orders placed or performed during the hospital encounter of 12/16/22 (from the past 24 hour(s))  Comprehensive metabolic panel     Status: Abnormal    Collection Time: 12/16/22  2:57 PM  Result Value Ref Range   Sodium 135 135 - 145 mmol/L   Potassium 4.0 3.5 - 5.1 mmol/L   Chloride 98 98 - 111 mmol/L   CO2 27 22 - 32 mmol/L   Glucose, Bld 103 (H) 70 - 99 mg/dL   BUN 13 8 - 23 mg/dL   Creatinine, Ser 6.44 0.44 - 1.00 mg/dL   Calcium 9.4 8.9 - 03.4 mg/dL   Total Protein 6.9 6.5 - 8.1 g/dL   Albumin 3.7 3.5 - 5.0 g/dL   AST 742 (H) 15 - 41 U/L   ALT 608 (H) 0 - 44 U/L   Alkaline Phosphatase 475 (H) 38 - 126 U/L   Total Bilirubin 11.9 (H) 0.3 - 1.2 mg/dL   GFR, Estimated >59 >56 mL/min   Anion gap 10 5 - 15  CBC     Status: Abnormal   Collection Time: 12/16/22  2:57 PM  Result Value Ref Range   WBC 2.7 (L) 4.0 - 10.5 K/uL   RBC 4.11 3.87 - 5.11 MIL/uL   Hemoglobin 11.3 (L) 12.0 - 15.0 g/dL   HCT 38.7 56.4 - 33.2 %   MCV 87.6 80.0 - 100.0 fL   MCH 27.5 26.0 - 34.0 pg   MCHC  31.4 30.0 - 36.0 g/dL   RDW 16.1 (H) 09.6 - 04.5 %   Platelets 119 (L) 150 - 400 K/uL   nRBC 0.0 0.0 - 0.2 %     No results found.  ROS:  As stated above in the HPI otherwise negative.  Blood pressure (!) 134/91, pulse 66, temperature (!) 97.5 F (36.4 C), temperature source Oral, resp. rate 17, last menstrual period 01/11/2011, SpO2 100%.    PE: Gen: NAD, Alert and Oriented HEENT:  [place]/AT, EOMI, scleral icterus Lungs: CTA Bilaterally CV: RRR without M/G/R ABD: Soft, NTND, +BS Ext: No C/C/E  Assessment/Plan: 1) Biliary obstruction. 2) Jaundice. 3) Prurititis.   The new laboratory values are suggestive of a malignant etiology for her presentation.  Initially, when her TB was at 4.0 and she complained of RUQ pain with PO intake, it was suggestive of choledocholithiasis.  It is likely that the biliary ductal dilation worsened since the RUQ U/S.  Further evaluation with an MRCP is pending.  Plan: 1) MRCP today. 2) Follow labs. 3) ERCP with stenting tomorrow.  ? EUS with FNA, if time permits. 4) Check Ca19-9.  , D 12/16/2022, 5:12 PM

## 2022-12-17 ENCOUNTER — Inpatient Hospital Stay (HOSPITAL_COMMUNITY): Payer: BC Managed Care – PPO

## 2022-12-17 ENCOUNTER — Inpatient Hospital Stay (HOSPITAL_COMMUNITY): Payer: BC Managed Care – PPO | Admitting: Anesthesiology

## 2022-12-17 ENCOUNTER — Encounter (HOSPITAL_COMMUNITY)
Disposition: A | Discharge status: Home / Self Care | Payer: Self-pay | Source: Ambulatory Visit | Attending: Gastroenterology

## 2022-12-17 HISTORY — PX: ERCP: SHX5425

## 2022-12-17 HISTORY — PX: BILIARY STENT PLACEMENT: SHX5538

## 2022-12-17 HISTORY — PX: BILIARY BRUSHING: SHX6843

## 2022-12-17 HISTORY — PX: SPHINCTEROTOMY: SHX5544

## 2022-12-17 LAB — CBC
HCT: 34.3 % — ABNORMAL LOW (ref 36.0–46.0)
Hemoglobin: 10.8 g/dL — ABNORMAL LOW (ref 12.0–15.0)
MCH: 27.8 pg (ref 26.0–34.0)
MCHC: 31.5 g/dL (ref 30.0–36.0)
MCV: 88.4 fL (ref 80.0–100.0)
Platelets: 100 10*3/uL — ABNORMAL LOW (ref 150–400)
RBC: 3.88 MIL/uL (ref 3.87–5.11)
RDW: 16.1 % — ABNORMAL HIGH (ref 11.5–15.5)
WBC: 2 10*3/uL — ABNORMAL LOW (ref 4.0–10.5)
nRBC: 0 % (ref 0.0–0.2)

## 2022-12-17 LAB — COMPREHENSIVE METABOLIC PANEL
ALT: 628 U/L — ABNORMAL HIGH (ref 0–44)
AST: 401 U/L — ABNORMAL HIGH (ref 15–41)
Albumin: 3.5 g/dL (ref 3.5–5.0)
Alkaline Phosphatase: 439 U/L — ABNORMAL HIGH (ref 38–126)
Anion gap: 12 (ref 5–15)
BUN: 15 mg/dL (ref 8–23)
CO2: 22 mmol/L (ref 22–32)
Calcium: 9 mg/dL (ref 8.9–10.3)
Chloride: 102 mmol/L (ref 98–111)
Creatinine, Ser: 0.52 mg/dL (ref 0.44–1.00)
GFR, Estimated: >60 mL/min (ref >60)
Glucose, Bld: 112 mg/dL — ABNORMAL HIGH (ref 70–99)
Potassium: 4.3 mmol/L (ref 3.5–5.1)
Sodium: 136 mmol/L (ref 135–145)
Total Bilirubin: 12.2 mg/dL — ABNORMAL HIGH (ref 0.3–1.2)
Total Protein: 6.6 g/dL (ref 6.5–8.1)

## 2022-12-17 SURGERY — ERCP, WITH INTERVENTION IF INDICATED
Anesthesia: General

## 2022-12-17 MED ORDER — CIPROFLOXACIN IN D5W 400 MG/200ML IV SOLN
INTRAVENOUS | Status: AC
  Filled 2022-12-17: qty 200

## 2022-12-17 MED ORDER — MIDAZOLAM HCL 5 MG/5ML IJ SOLN
INTRAMUSCULAR | Status: DC | PRN
  Administered 2022-12-17 (×2): 1 mg via INTRAVENOUS

## 2022-12-17 MED ORDER — ROCURONIUM BROMIDE 100 MG/10ML IV SOLN
INTRAVENOUS | Status: DC | PRN
  Administered 2022-12-17: 20 mg via INTRAVENOUS
  Administered 2022-12-17: 50 mg via INTRAVENOUS

## 2022-12-17 MED ORDER — DEXAMETHASONE SODIUM PHOSPHATE 10 MG/ML IJ SOLN
INTRAMUSCULAR | Status: DC | PRN
  Administered 2022-12-17: 8 mg via INTRAVENOUS

## 2022-12-17 MED ORDER — DICLOFENAC SUPPOSITORY 100 MG
RECTAL | Status: AC
  Filled 2022-12-17: qty 1

## 2022-12-17 MED ORDER — CIPROFLOXACIN IN D5W 400 MG/200ML IV SOLN
INTRAVENOUS | Status: DC | PRN
  Administered 2022-12-17: 400 mg via INTRAVENOUS

## 2022-12-17 MED ORDER — GLUCAGON HCL RDNA (DIAGNOSTIC) 1 MG IJ SOLR
INTRAMUSCULAR | Status: AC
  Filled 2022-12-17: qty 1

## 2022-12-17 MED ORDER — PROPOFOL 10 MG/ML IV BOLUS
INTRAVENOUS | Status: DC | PRN
  Administered 2022-12-17: 150 mg via INTRAVENOUS

## 2022-12-17 MED ORDER — DICLOFENAC SUPPOSITORY 100 MG
RECTAL | Status: DC | PRN
  Administered 2022-12-17: 100 mg via RECTAL

## 2022-12-17 MED ORDER — LIDOCAINE HCL (CARDIAC) PF 100 MG/5ML IV SOSY
PREFILLED_SYRINGE | INTRAVENOUS | Status: DC | PRN
  Administered 2022-12-17: 60 mg via INTRAVENOUS

## 2022-12-17 MED ORDER — ONDANSETRON HCL 4 MG/2ML IJ SOLN
INTRAMUSCULAR | Status: DC | PRN
  Administered 2022-12-17: 4 mg via INTRAVENOUS

## 2022-12-17 MED ORDER — DEXMEDETOMIDINE HCL IN NACL 80 MCG/20ML IV SOLN
INTRAVENOUS | Status: DC | PRN
  Administered 2022-12-17: 8 ug via INTRAVENOUS
  Administered 2022-12-17: 4 ug via INTRAVENOUS

## 2022-12-17 MED ORDER — PROPOFOL 10 MG/ML IV BOLUS
INTRAVENOUS | Status: AC
  Filled 2022-12-17: qty 20

## 2022-12-17 MED ORDER — FENTANYL CITRATE (PF) 100 MCG/2ML IJ SOLN
INTRAMUSCULAR | Status: AC
  Filled 2022-12-17: qty 2

## 2022-12-17 MED ORDER — SODIUM CHLORIDE (PF) 0.9 % IJ SOLN: INTRAMUSCULAR | Status: DC | PRN

## 2022-12-17 MED ORDER — PHENYLEPHRINE HCL (PRESSORS) 10 MG/ML IV SOLN
INTRAVENOUS | Status: DC | PRN
  Administered 2022-12-17: 80 ug via INTRAVENOUS

## 2022-12-17 MED ORDER — FENTANYL CITRATE (PF) 100 MCG/2ML IJ SOLN
INTRAMUSCULAR | Status: DC | PRN
  Administered 2022-12-17: 50 ug via INTRAVENOUS
  Administered 2022-12-17 (×2): 25 ug via INTRAVENOUS

## 2022-12-17 MED ORDER — LACTATED RINGERS IV SOLN: INTRAVENOUS | Status: DC

## 2022-12-17 MED ORDER — SUGAMMADEX SODIUM 200 MG/2ML IV SOLN
INTRAVENOUS | Status: DC | PRN
  Administered 2022-12-17: 200 mg via INTRAVENOUS
  Administered 2022-12-17: 100 mg via INTRAVENOUS

## 2022-12-17 MED ORDER — SODIUM CHLORIDE 0.9 % IV SOLN: INTRAVENOUS | Status: DC

## 2022-12-17 MED ORDER — MIDAZOLAM HCL 2 MG/2ML IJ SOLN
INTRAMUSCULAR | Status: AC
  Filled 2022-12-17: qty 2

## 2022-12-17 NOTE — Anesthesia Preprocedure Evaluation (Addendum)
Anesthesia Evaluation  Patient identified by MRN, date of birth, ID band Patient awake    Reviewed: Allergy & Precautions, H&P , NPO status , Patient's Chart, lab work & pertinent test results  Airway Mallampati: II  TM Distance: >3 FB Neck ROM: Full    Dental no notable dental hx. (+) Teeth Intact, Dental Advisory Given   Pulmonary neg pulmonary ROS   Pulmonary exam normal breath sounds clear to auscultation       Cardiovascular negative cardio ROS  Rhythm:Regular Rate:Normal     Neuro/Psych negative neurological ROS  negative psych ROS   GI/Hepatic Neg liver ROS,GERD  Medicated,,  Endo/Other  negative endocrine ROS    Renal/GU negative Renal ROS  negative genitourinary   Musculoskeletal  (+) Arthritis , Osteoarthritis,    Abdominal   Peds  Hematology  (+) Blood dyscrasia, anemia   Anesthesia Other Findings   Reproductive/Obstetrics negative OB ROS                             Anesthesia Physical Anesthesia Plan  ASA: 2  Anesthesia Plan: General   Post-op Pain Management: Minimal or no pain anticipated   Induction: Intravenous  PONV Risk Score and Plan: 4 or greater and Ondansetron, Dexamethasone and Midazolam  Airway Management Planned: Oral ETT  Additional Equipment:   Intra-op Plan:   Post-operative Plan: Extubation in OR  Informed Consent: I have reviewed the patients History and Physical, chart, labs and discussed the procedure including the risks, benefits and alternatives for the proposed anesthesia with the patient or authorized representative who has indicated his/her understanding and acceptance.     Dental advisory given  Plan Discussed with: CRNA  Anesthesia Plan Comments:        Anesthesia Quick Evaluation

## 2022-12-17 NOTE — Anesthesia Procedure Notes (Signed)
Procedure Name: Intubation Date/Time: 12/17/2022 10:19 AM  Performed by: Garth Bigness, CRNAPre-anesthesia Checklist: Patient identified, Emergency Drugs available, Suction available and Patient being monitored Patient Re-evaluated:Patient Re-evaluated prior to induction Oxygen Delivery Method: Circle system utilized Preoxygenation: Pre-oxygenation with 100% oxygen Induction Type: IV induction Ventilation: Mask ventilation without difficulty Laryngoscope Size: Mac and 3 Grade View: Grade I Tube type: Oral Tube size: 7.0 mm Number of attempts: 1 Airway Equipment and Method: Stylet Placement Confirmation: ETT inserted through vocal cords under direct vision, positive ETCO2 and breath sounds checked- equal and bilateral Secured at: 21 cm Tube secured with: Tape Dental Injury: Teeth and Oropharynx as per pre-operative assessment

## 2022-12-17 NOTE — Op Note (Signed)
[facility] Patient Name: Nicole Johnson Procedure Date: 12/17/2022 MRN: [id number] Attending MD: Jeani Hawking , MD, [id number] Date of Birth: 03/06/1961 CSN: [id number] Age: 62 Admit Type: Inpatient Procedure:                ERCP Indications:              Malignant stricture of the common bile duct Providers:                Jeani Hawking, MD, Stephens Shire RN, RN, Cephus Richer, RN, Kandice Robinsons, Technician Referring MD:             Jeani Hawking, MD Medicines:                General Anesthesia Complications:            No immediate complications. Estimated Blood Loss:     Estimated blood loss: none. Procedure:                Pre-Anesthesia Assessment:                           - Prior to the procedure, a History and Physical                            was performed, and patient medications and                            allergies were reviewed. The patient's tolerance of                            previous anesthesia was also reviewed. The risks                            and benefits of the procedure and the sedation                            options and risks were discussed with the patient.                            All questions were answered, and informed consent                            was obtained. Prior Anticoagulants: The patient has                            taken no anticoagulant or antiplatelet agents. ASA                            Grade Assessment: II - A patient with mild systemic                            disease. After reviewing the risks and benefits,  the patient was deemed in satisfactory condition to                            undergo the procedure.                           - Sedation was administered by an anesthesia                            professional. General anesthesia was attained.                           After obtaining informed consent, the scope was                             passed under direct vision. Throughout the                            procedure, the patient's blood pressure, pulse, and                            oxygen saturations were monitored continuously. The                            [id number]-[id number] ([id number] Olympus duodenoscope was                            introduced through the mouth, and used to inject                            contrast into and used to inject contrast into the                            bile duct and dorsal pancreatic duct. The ERCP was                            technically difficult and complex. The patient                            tolerated the procedure well. Scope In: Scope Out: Findings:      The major papilla was normal. The bile duct was deeply cannulated with       the short-nosed traction sphincterotome. Contrast was injected. I       personally interpreted the bile duct images. There was brisk flow of       contrast through the ducts. Image quality was excellent. Contrast       extended to the hepatic ducts. The common bile duct was moderately       dilated, with a mass causing an obstruction. The largest diameter was 10       mm. Revolution wire was passed into the biliary tree. A 10 mm biliary       sphincterotomy was made with a monofilament traction (standard)       sphincterotome using ERBE electrocautery. There was no       post-sphincterotomy bleeding. Cells for cytology  were obtained by       brushing in the lower third of the main bile duct. One 8.5 Fr by 5 cm       transpapillary plastic stent with a single external flap and a single       internal flap was placed 5.5 cm into the common bile duct. Bile flowed       through the stent. The stent was in good position.      The ampulla was identified and there was no evidence of any biliary       drainage. Cannulation was performed and during the second attempt the       guidewire entered the PD. The guidewire was left in the PD to employ a       two wire  technique. Using the Revolution guidewire, after several       attempts, the CBD was cannulated. The guidewire was secured in the right       intrahepatic ducts. The PD wire was removed. Contrast injection revealed       an extrinsic compression of the distal CBD measuring approximately 3 cm.       Brushings of the CBD were performed x 2. An 8.5 Fr x 5 cm stent was       successfully placed in the CBD. Dark bile drained spontaneously.       Fluoroscopy confirmed the placement of the stent. Becuase of time       constraints with other physician procedures, and the lack of CRNA staff,       an EUS was not able to be performed before or after the ERCP. Impression:               - The major papilla appeared normal.                           - The common bile duct was moderately dilated, with                            a mass causing an obstruction.                           - A biliary sphincterotomy was performed.                           - Cells for cytology obtained in the lower third of                            the main duct.                           - One plastic stent was placed into the common bile                            duct. Moderate Sedation:      Not Applicable - Patient had care per Anesthesia. Recommendation:           - Return patient to hospital ward for ongoing care.                           - Resume regular diet.                           -  EUS with FNA tomorrow. Procedure Code(s):        --- Professional ---                           [phone], Endoscopic retrograde                            cholangiopancreatography (ERCP); with placement of                            endoscopic stent into biliary or pancreatic duct,                            including pre- and post-dilation and guide wire                            passage, when performed, including sphincterotomy,                            when performed, each stent                           [id number], Endoscopic  catheterization of the biliary                            ductal system, radiological supervision and                            interpretation Diagnosis Code(s):        --- Professional ---                           K83.1, Obstruction of bile duct CPT copyright 2022 American [employer]. All rights reserved. The codes documented in this report are preliminary and upon coder review may  be revised to meet current compliance requirements. Jeani Hawking, MD Jeani Hawking, MD 12/17/2022 11:30:43 AM This report has been signed electronically. Number of Addenda: 0

## 2022-12-17 NOTE — Progress Notes (Signed)
   12/17/22 1258  TOC Brief Assessment  Insurance and Status Reviewed  Patient has primary care physician Yes  Home environment has been reviewed Home w/ spouse  Prior level of function: Independent  Prior/Current Home Services No current home services  Social Determinants of Health Reivew SDOH reviewed no interventions necessary  Readmission risk has been reviewed Yes  Transition of care needs no transition of care needs at this time

## 2022-12-17 NOTE — Transfer of Care (Signed)
Immediate Anesthesia Transfer of Care Note  Patient: Nicole Johnson  Procedure(s) Performed: ENDOSCOPIC RETROGRADE CHOLANGIOPANCREATOGRAPHY (ERCP) SPHINCTEROTOMY BILIARY STENT PLACEMENT BILIARY BRUSHING  Patient Location: PACU and Endoscopy Unit  Anesthesia Type:General  Level of Consciousness: oriented, drowsy, and patient cooperative  Airway & Oxygen Therapy: Patient Spontanous Breathing and Patient connected to face mask oxygen  Post-op Assessment: Report given to RN and Post -op Vital signs reviewed and stable  Post vital signs: Reviewed and stable  Last Vitals:  Vitals Value Taken Time  BP 126/67 12/17/22 1140  Temp 36.6 C 12/17/22 1135  Pulse 78 12/17/22 1143  Resp 12 12/17/22 1143  SpO2 94 % 12/17/22 1143  Vitals shown include unfiled device data.  Last Pain:  Vitals:   12/17/22 1140  TempSrc:   PainSc: 0-No pain      Patients Stated Pain Goal: 1 (12/17/22 0124)  Complications: No notable events documented.

## 2022-12-17 NOTE — Anesthesia Postprocedure Evaluation (Signed)
Anesthesia Post Note  Patient: Nicole Johnson  Procedure(s) Performed: ENDOSCOPIC RETROGRADE CHOLANGIOPANCREATOGRAPHY (ERCP) SPHINCTEROTOMY BILIARY STENT PLACEMENT BILIARY BRUSHING     Patient location during evaluation: PACU Anesthesia Type: General Level of consciousness: awake and alert Pain management: pain level controlled Vital Signs Assessment: post-procedure vital signs reviewed and stable Respiratory status: spontaneous breathing, nonlabored ventilation and respiratory function stable Cardiovascular status: blood pressure returned to baseline and stable Postop Assessment: no apparent nausea or vomiting Anesthetic complications: no  No notable events documented.  Last Vitals:  Vitals:   12/17/22 1140 12/17/22 1150  BP: 126/67 (!) 141/71  Pulse: 76 81  Resp: 15 16  Temp:    SpO2: 98% 97%    Last Pain:  Vitals:   12/17/22 1150  TempSrc:   PainSc: 0-No pain                 Ressie Slevin,W. EDMOND

## 2022-12-18 ENCOUNTER — Encounter (HOSPITAL_COMMUNITY)
Disposition: A | Discharge status: Home / Self Care | Payer: Self-pay | Source: Ambulatory Visit | Attending: Gastroenterology

## 2022-12-18 ENCOUNTER — Encounter (HOSPITAL_COMMUNITY): Payer: Self-pay | Admitting: Gastroenterology

## 2022-12-18 ENCOUNTER — Inpatient Hospital Stay (HOSPITAL_COMMUNITY): Payer: BC Managed Care – PPO | Admitting: Registered Nurse

## 2022-12-18 HISTORY — PX: EUS: SHX5427

## 2022-12-18 HISTORY — PX: FINE NEEDLE ASPIRATION: SHX5430

## 2022-12-18 HISTORY — PX: ESOPHAGOGASTRODUODENOSCOPY (EGD) WITH PROPOFOL: SHX5813

## 2022-12-18 LAB — COMPREHENSIVE METABOLIC PANEL
ALT: 597 U/L — ABNORMAL HIGH (ref 0–44)
AST: 331 U/L — ABNORMAL HIGH (ref 15–41)
Albumin: 3.5 g/dL (ref 3.5–5.0)
Alkaline Phosphatase: 417 U/L — ABNORMAL HIGH (ref 38–126)
Anion gap: 10 (ref 5–15)
BUN: 16 mg/dL (ref 8–23)
CO2: 25 mmol/L (ref 22–32)
Calcium: 8.9 mg/dL (ref 8.9–10.3)
Chloride: 101 mmol/L (ref 98–111)
Creatinine, Ser: 0.8 mg/dL (ref 0.44–1.00)
GFR, Estimated: >60 mL/min (ref >60)
Glucose, Bld: 107 mg/dL — ABNORMAL HIGH (ref 70–99)
Potassium: 4.1 mmol/L (ref 3.5–5.1)
Sodium: 136 mmol/L (ref 135–145)
Total Bilirubin: 5.5 mg/dL — ABNORMAL HIGH (ref 0.3–1.2)
Total Protein: 6.4 g/dL — ABNORMAL LOW (ref 6.5–8.1)

## 2022-12-18 SURGERY — UPPER ENDOSCOPIC ULTRASOUND (EUS) LINEAR
Anesthesia: Monitor Anesthesia Care

## 2022-12-18 MED ORDER — PROPOFOL 500 MG/50ML IV EMUL
INTRAVENOUS | Status: DC | PRN
  Administered 2022-12-18: 130 ug/kg/min via INTRAVENOUS

## 2022-12-18 MED ORDER — FENTANYL CITRATE (PF) 100 MCG/2ML IJ SOLN: 25.0000 ug | INTRAMUSCULAR | Status: DC | PRN

## 2022-12-18 MED ORDER — LACTATED RINGERS IV SOLN
INTRAVENOUS | Status: AC | PRN
  Administered 2022-12-18: 10 mL/h via INTRAVENOUS

## 2022-12-18 MED ORDER — PROPOFOL 1000 MG/100ML IV EMUL
INTRAVENOUS | Status: AC
  Filled 2022-12-18: qty 100

## 2022-12-18 MED ORDER — OXYCODONE HCL 5 MG/5ML PO SOLN
5.0000 mg | Freq: Once | ORAL | Status: DC | PRN
  Filled 2022-12-18: qty 5

## 2022-12-18 MED ORDER — ONDANSETRON HCL 4 MG/2ML IJ SOLN
INTRAMUSCULAR | Status: DC | PRN
  Administered 2022-12-18: 4 mg via INTRAVENOUS

## 2022-12-18 MED ORDER — DEXMEDETOMIDINE HCL IN NACL 80 MCG/20ML IV SOLN
INTRAVENOUS | Status: DC | PRN
  Administered 2022-12-18: 8 ug via INTRAVENOUS

## 2022-12-18 MED ORDER — PROPOFOL 10 MG/ML IV BOLUS
INTRAVENOUS | Status: DC | PRN
  Administered 2022-12-18: 10 mg via INTRAVENOUS
  Administered 2022-12-18: 30 mg via INTRAVENOUS
  Administered 2022-12-18: 20 mg via INTRAVENOUS
  Administered 2022-12-18: 40 mg via INTRAVENOUS
  Administered 2022-12-18: 20 mg via INTRAVENOUS
  Administered 2022-12-18: 30 mg via INTRAVENOUS
  Administered 2022-12-18: 20 mg via INTRAVENOUS

## 2022-12-18 MED ORDER — LIDOCAINE 2% (20 MG/ML) 5 ML SYRINGE
INTRAMUSCULAR | Status: DC | PRN
  Administered 2022-12-18: 60 mg via INTRAVENOUS

## 2022-12-18 MED ORDER — OXYCODONE HCL 5 MG PO TABS: 5.0000 mg | ORAL_TABLET | Freq: Once | ORAL | Status: DC | PRN

## 2022-12-18 MED ORDER — ONDANSETRON HCL 4 MG/2ML IJ SOLN: 4.0000 mg | Freq: Four times a day (QID) | INTRAMUSCULAR | Status: DC | PRN

## 2022-12-18 NOTE — Discharge Summary (Signed)
Physician Discharge Summary  Patient ID: Nicole Johnson MRN: [id number] DOB/AGE: Feb 25, 1961 62 y.o.  Admit date: 12/16/2022 Discharge date: 12/18/2022  Admission Diagnoses: Obstructive jaundice and pruritis  Discharge Diagnoses: Pancreatic head mass Principal Problem:   Jaundice   Discharged Condition: good  Hospital Course: The patient was admitted to further evaluate her obstructive liver enzymes as well as her jaundice/pruritus.  The repeat liver panel showed that the TB was increased up to 11 from 4.  The MRCP identified a large pancreatic head mass.  The patient underwent an ERCP on 12/17/2022 and an 8.5 Fr x 5 cm plastic biliary stent was successfully placed.  A concurrent EUS was not possible with staffing issues and time constraints.  The EUS with FNA was performed on 12/18/2022 and samples were obtained.  The patient reported that she was able to sleep for the first time in two weeks after the stent placement.  Her pruritus nearly resolved.  Consults: None  Significant Diagnostic Studies: MRCP, ERCP, and EUS with FNA.  Treatments: Biliary stent placement.  Discharge Exam: Blood pressure 130/85, pulse 69, temperature (!) 97 F (36.1 C), temperature source Temporal, resp. rate 20, height 5\' 4"  (1.626 m), weight 84.8 kg, last menstrual period 01/11/2011, SpO2 99%. General appearance: alert and no distress Eyes: conjunctivae/corneas clear. PERRL, EOM's intact. Fundi benign., icteric Resp: clear to auscultation bilaterally Cardio: regular rate and rhythm GI: soft, non-tender; bowel sounds normal; no masses,  no organomegaly  Disposition: Discharge disposition: 01-Home or Self Care       Discharge Instructions     Call MD for:  severe uncontrolled pain   Complete by: As directed    Call MD for:  temperature >100.4   Complete by: As directed    Diet general   Complete by: As directed    Increase activity slowly   Complete by: As directed           Signed: Anayia Eugene D 12/18/2022, 2:32 PM

## 2022-12-18 NOTE — Anesthesia Preprocedure Evaluation (Signed)
Anesthesia Evaluation  Patient identified by MRN, date of birth, ID band Patient awake    Reviewed: Allergy & Precautions, H&P , NPO status , Patient's Chart, lab work & pertinent test results  Airway Mallampati: II   Neck ROM: full    Dental   Pulmonary neg pulmonary ROS   breath sounds clear to auscultation       Cardiovascular negative cardio ROS  Rhythm:regular Rate:Normal     Neuro/Psych    GI/Hepatic ,GERD  ,,  Endo/Other    Renal/GU      Musculoskeletal  (+) Arthritis ,    Abdominal   Peds  Hematology  (+) Blood dyscrasia, anemia   Anesthesia Other Findings   Reproductive/Obstetrics                             Anesthesia Physical Anesthesia Plan  ASA: 2  Anesthesia Plan: MAC   Post-op Pain Management:    Induction: Intravenous  PONV Risk Score and Plan: 2 and Propofol infusion and Treatment may vary due to age or medical condition  Airway Management Planned: Nasal Cannula  Additional Equipment:   Intra-op Plan:   Post-operative Plan:   Informed Consent: I have reviewed the patients History and Physical, chart, labs and discussed the procedure including the risks, benefits and alternatives for the proposed anesthesia with the patient or authorized representative who has indicated his/her understanding and acceptance.     Dental advisory given  Plan Discussed with: CRNA, Anesthesiologist and Surgeon  Anesthesia Plan Comments:        Anesthesia Quick Evaluation

## 2022-12-18 NOTE — Plan of Care (Signed)
°  Problem: Education: °Goal: Knowledge of General Education information will improve °Description: Including pain rating scale, medication(s)/side effects and non-pharmacologic comfort measures °Outcome: Progressing °  °Problem: Health Behavior/Discharge Planning: °Goal: Ability to manage health-related needs will improve °Outcome: Progressing °  °Problem: Clinical Measurements: °Goal: Ability to maintain clinical measurements within normal limits will improve °Outcome: Progressing °Goal: Will remain free from infection °Outcome: Progressing °Goal: Cardiovascular complication will be avoided °Outcome: Progressing °  °Problem: Activity: °Goal: Risk for activity intolerance will decrease °Outcome: Progressing °  °Problem: Nutrition: °Goal: Adequate nutrition will be maintained °Outcome: Progressing °  °

## 2022-12-18 NOTE — Transfer of Care (Signed)
Immediate Anesthesia Transfer of Care Note  Patient: Nicole Johnson  Procedure(s) Performed: UPPER ENDOSCOPIC ULTRASOUND (EUS) LINEAR FINE NEEDLE ASPIRATION (FNA) LINEAR  Patient Location: PACU and Endoscopy Unit  Anesthesia Type:MAC  Level of Consciousness: awake, alert , oriented, and patient cooperative  Airway & Oxygen Therapy: Patient Spontanous Breathing and Patient connected to face mask oxygen  Post-op Assessment: Report given to RN, Post -op Vital signs reviewed and stable, and Patient moving all extremities  Post vital signs: Reviewed and stable  Last Vitals:  Vitals Value Taken Time  BP    Temp    Pulse 69 12/18/22 1307  Resp 17 12/18/22 1307  SpO2 100 % 12/18/22 1307  Vitals shown include unfiled device data.  Last Pain:  Vitals:   12/18/22 1135  TempSrc: Temporal  PainSc: 0-No pain      Patients Stated Pain Goal: 1 (12/17/22 0124)  Complications: No notable events documented.

## 2022-12-18 NOTE — Anesthesia Procedure Notes (Signed)
Procedure Name: MAC Date/Time: 12/18/2022 12:07 PM  Performed by: Elisabeth Cara, CRNAPre-anesthesia Checklist: Patient identified, Emergency Drugs available, Suction available, Patient being monitored and Timeout performed Patient Re-evaluated:Patient Re-evaluated prior to induction Oxygen Delivery Method: Simple face mask Placement Confirmation: CO2 detector Dental Injury: Teeth and Oropharynx as per pre-operative assessment

## 2022-12-18 NOTE — Interval H&P Note (Signed)
History and Physical Interval Note:  12/18/2022 12:05 PM  Nicole Johnson  has presented today for surgery, with the diagnosis of Pancreatic head mass..  The various methods of treatment have been discussed with the patient and family. After consideration of risks, benefits and other options for treatment, the patient has consented to  Procedure(s): UPPER ENDOSCOPIC ULTRASOUND (EUS) LINEAR (N/A) as a surgical intervention.  The patient's history has been reviewed, patient examined, no change in status, stable for surgery.  I have reviewed the patient's chart and labs.  Questions were answered to the patient's satisfaction.     Kalianne Fetting D

## 2022-12-18 NOTE — Op Note (Signed)
[facility] Patient Name: Nicole Johnson Procedure Date: 12/18/2022 MRN: [id number] Attending MD: Jeani Hawking , MD, [id number] Date of Birth: Oct 04, 1960 CSN: [id number] Age: 62 Admit Type: Inpatient Procedure:                Upper EUS Indications:              Suspected mass in pancreas on MRI Providers:                Jeani Hawking, MD, Eliberto Ivory, RN, Margaree Mackintosh, RN, Marja Kays, Technician Referring MD:             Jeani Hawking, MD Medicines:                Propofol per Anesthesia Complications:            No immediate complications. Estimated Blood Loss:     Estimated blood loss: none. Estimated blood loss                            was minimal. Procedure:                Pre-Anesthesia Assessment:                           - Prior to the procedure, a History and Physical                            was performed, and patient medications and                            allergies were reviewed. The patient's tolerance of                            previous anesthesia was also reviewed. The risks                            and benefits of the procedure and the sedation                            options and risks were discussed with the patient.                            All questions were answered, and informed consent                            was obtained. Prior Anticoagulants: The patient has                            taken no anticoagulant or antiplatelet agents. ASA                            Grade Assessment: II - A patient with mild systemic  disease. After reviewing the risks and benefits,                            the patient was deemed in satisfactory condition to                            undergo the procedure.                           - Sedation was administered by an anesthesia                            professional. Deep sedation was attained.                           After  obtaining informed consent, the endoscope was                            passed under direct vision. Throughout the                            procedure, the patient's blood pressure, pulse, and                            oxygen saturations were monitored continuously. The                            [id number] ([id number] Olympus linear ultrasound scope                            was introduced through the mouth, and advanced to                            the second part of duodenum. The upper EUS was                            technically difficult and complex. The patient                            tolerated the procedure well. Scope In: Scope Out: Findings:      ENDOSONOGRAPHIC FINDING: :      An irregular mass was identified in the pancreatic head. The mass was       hypoechoic. The mass measured 32 mm by 38 mm in maximal cross-sectional       diameter. The endosonographic borders were poorly-defined. The remainder       of the pancreas was examined. The endosonographic appearance of       parenchyma and the upstream pancreatic duct indicated parenchymal       atrophy. Fine needle aspiration for cytology was performed. Color       Doppler imaging was utilized prior to needle puncture to confirm a lack       of significant vascular structures within the needle path. Five passes       were made with the 22 gauge needle using a transduodenal approach. A  stylet was used. A preliminary cytologic examination was not performed.       Final cytology results are pending.      The Celiac axis and SMA did not appear to have involvement from the       mass. The body and tail of the pancreas were atrophied. In the head of       the pancreas an amorphus hypoechoic lesion was identified. The biliary       stent was noted to be coursing through a portion of this mass. Five       passes with the 22 gauge FNA needle was performed. Obtaining a stable       position that did not involve any vessels  was difficult. This lesion was       hypervascular. The gallbladder was dilated. No evidence of any       lymphadenopathy. Impression:               - A mass was identified in the pancreatic head.                            Fine needle aspiration performed. Moderate Sedation:      Not Applicable - Patient had care per Anesthesia. Recommendation:           - Return patient to hospital ward for ongoing care.                           - Resume regular diet.                           - Await cytology results. Procedure Code(s):        --- Professional ---                           [id number], Esophagogastroduodenoscopy, flexible,                            transoral; with transendoscopic ultrasound-guided                            intramural or transmural fine needle                            aspiration/biopsy(s), (includes endoscopic                            ultrasound examination limited to the esophagus,                            stomach or duodenum, and adjacent structures) Diagnosis Code(s):        --- Professional ---                           K86.89, Other specified diseases of pancreas                           R93.3, Abnormal findings on diagnostic imaging of  other parts of digestive tract CPT copyright 2022 American Medical Association. All rights reserved. The codes documented in this report are preliminary and upon coder review may  be revised to meet current compliance requirements. Jeani Hawking, MD Jeani Hawking, MD 12/18/2022 1:13:33 PM This report has been signed electronically. Number of Addenda: 0

## 2022-12-20 ENCOUNTER — Telehealth: Payer: Self-pay | Admitting: *Deleted

## 2022-12-20 ENCOUNTER — Encounter (HOSPITAL_COMMUNITY): Payer: Self-pay | Admitting: Gastroenterology

## 2022-12-20 NOTE — Transitions of Care (Post Inpatient/ED Visit) (Signed)
   12/20/2022  Name: MARIALUIZA WARLEY MRN: [id number] DOB: 1961-04-16  Today's TOC FU Call Status: Today's TOC FU Call Status:: Successful TOC FU Call Competed TOC FU Call Complete Date: 12/20/22  Transition Care Management Follow-up Telephone Call    Items Reviewed: Did you receive and understand the discharge instructions provided?: Yes Medications obtained,verified, and reconciled?: Yes (Medications Reviewed) Any new allergies since your discharge?: No Dietary orders reviewed?: No Do you have support at home?: Yes People in Home: spouse Name of Support/Comfort Primary Source: John  Medications Reviewed Today: Medications Reviewed Today   Medications were not reviewed in this encounter     Home Care and Equipment/Supplies: Were Home Health Services Ordered?: NA Any new equipment or medical supplies ordered?: NA  Functional Questionnaire: Do you need assistance with bathing/showering or dressing?: No Do you need assistance with meal preparation?: No Do you need assistance with eating?: No Do you need assistance with getting out of bed/getting out of a chair/moving?: No Do you have difficulty managing or taking your medications?: No  Follow up appointments reviewed: PCP Follow-up appointment confirmed?: NA Specialist Hospital Follow-up appointment confirmed?: No Reason Specialist Follow-Up Not Confirmed: Patient has Specialist Provider Number and will Call for Appointment (Dr Elnoria Howard is to call patient when the bx results are in and make an appt.) Do you need transportation to your follow-up appointment?: No Do you understand care options if your condition(s) worsen?: Yes-patient verbalized understanding  SDOH Interventions Today    Flowsheet Row Most Recent Value  SDOH Interventions   Food Insecurity Interventions Intervention Not Indicated  Housing Interventions Intervention Not Indicated  Transportation Interventions Intervention Not Indicated, Patient Resources  (Friends/Family)      Interventions Today    Flowsheet Row Most Recent Value  General Interventions   General Interventions Discussed/Reviewed General Interventions Discussed, General Interventions Reviewed, Doctor Visits  Doctor Visits Discussed/Reviewed Doctor Visits Discussed, Doctor Visits Reviewed  Charlyne Mom is awaiting Dr Elnoria Howard to get bx and make appt]  Pharmacy Interventions   Pharmacy Dicussed/Reviewed Pharmacy Topics Discussed      TOC Interventions Today    Flowsheet Row Most Recent Value  TOC Interventions   TOC Interventions Discussed/Reviewed TOC Interventions Discussed, TOC Interventions Reviewed       Gean Maidens BSN RN [facility] [phone]

## 2022-12-20 NOTE — Anesthesia Postprocedure Evaluation (Signed)
Anesthesia Post Note  Patient: Nicole Johnson  Procedure(s) Performed: UPPER ENDOSCOPIC ULTRASOUND (EUS) LINEAR FINE NEEDLE ASPIRATION (FNA) LINEAR     Patient location during evaluation: Endoscopy Anesthesia Type: MAC Level of consciousness: awake and alert Pain management: pain level controlled Vital Signs Assessment: post-procedure vital signs reviewed and stable Respiratory status: spontaneous breathing, nonlabored ventilation, respiratory function stable and patient connected to nasal cannula oxygen Cardiovascular status: stable and blood pressure returned to baseline Postop Assessment: no apparent nausea or vomiting Anesthetic complications: no   No notable events documented.  Last Vitals:  Vitals:   12/18/22 1320 12/18/22 1330  BP: 115/61 130/85  Pulse: 75 69  Resp: 18 20  Temp:    SpO2: 99% 99%    Last Pain:  Vitals:   12/18/22 1330  TempSrc:   PainSc: 0-No pain                 Krystian Younglove S

## 2022-12-21 ENCOUNTER — Encounter (HOSPITAL_COMMUNITY): Payer: Self-pay | Admitting: Gastroenterology

## 2022-12-21 LAB — CYTOLOGY - NON PAP

## 2022-12-22 ENCOUNTER — Other Ambulatory Visit: Payer: BC Managed Care – PPO

## 2022-12-23 ENCOUNTER — Other Ambulatory Visit: Payer: Self-pay | Admitting: Gastroenterology

## 2022-12-28 ENCOUNTER — Encounter (HOSPITAL_COMMUNITY): Payer: Self-pay | Admitting: Gastroenterology

## 2022-12-30 ENCOUNTER — Other Ambulatory Visit: Payer: Self-pay

## 2022-12-30 ENCOUNTER — Ambulatory Visit (HOSPITAL_COMMUNITY): Payer: BC Managed Care – PPO | Admitting: Anesthesiology

## 2022-12-30 ENCOUNTER — Encounter (HOSPITAL_COMMUNITY): Payer: Self-pay | Admitting: Gastroenterology

## 2022-12-30 ENCOUNTER — Encounter (HOSPITAL_COMMUNITY)
Admission: RE | Disposition: A | Discharge status: Home / Self Care | Payer: Self-pay | Source: Home / Self Care | Attending: Gastroenterology

## 2022-12-30 ENCOUNTER — Ambulatory Visit (HOSPITAL_COMMUNITY)
Admission: RE | Admit: 2022-12-30 | Discharge: 2022-12-30 | Disposition: A | Discharge status: Home / Self Care | Payer: BC Managed Care – PPO | Attending: Gastroenterology | Admitting: Gastroenterology

## 2022-12-30 DIAGNOSIS — K831 Obstruction of bile duct: Secondary | ICD-10-CM | POA: Insufficient documentation

## 2022-12-30 DIAGNOSIS — K8689 Other specified diseases of pancreas: Secondary | ICD-10-CM | POA: Diagnosis not present

## 2022-12-30 DIAGNOSIS — D649 Anemia, unspecified: Secondary | ICD-10-CM | POA: Diagnosis not present

## 2022-12-30 DIAGNOSIS — C250 Malignant neoplasm of head of pancreas: Secondary | ICD-10-CM | POA: Diagnosis not present

## 2022-12-30 DIAGNOSIS — L299 Pruritus, unspecified: Secondary | ICD-10-CM | POA: Insufficient documentation

## 2022-12-30 DIAGNOSIS — C159 Malignant neoplasm of esophagus, unspecified: Secondary | ICD-10-CM | POA: Diagnosis not present

## 2022-12-30 DIAGNOSIS — R17 Unspecified jaundice: Secondary | ICD-10-CM | POA: Insufficient documentation

## 2022-12-30 HISTORY — PX: ESOPHAGOGASTRODUODENOSCOPY (EGD) WITH PROPOFOL: SHX5813

## 2022-12-30 HISTORY — PX: UPPER ESOPHAGEAL ENDOSCOPIC ULTRASOUND (EUS): SHX6562

## 2022-12-30 HISTORY — PX: FINE NEEDLE ASPIRATION: SHX5430

## 2022-12-30 SURGERY — UPPER ESOPHAGEAL ENDOSCOPIC ULTRASOUND (EUS)
Anesthesia: Monitor Anesthesia Care

## 2022-12-30 MED ORDER — PROPOFOL 500 MG/50ML IV EMUL
INTRAVENOUS | Status: DC | PRN
  Administered 2022-12-30: 150 ug/kg/min via INTRAVENOUS

## 2022-12-30 MED ORDER — PROPOFOL 10 MG/ML IV BOLUS
INTRAVENOUS | Status: DC | PRN
  Administered 2022-12-30 (×2): 20 mg via INTRAVENOUS

## 2022-12-30 MED ORDER — PHENYLEPHRINE HCL (PRESSORS) 10 MG/ML IV SOLN
INTRAVENOUS | Status: DC | PRN
  Administered 2022-12-30 (×2): 80 ug via INTRAVENOUS

## 2022-12-30 MED ORDER — LACTATED RINGERS IV SOLN: INTRAVENOUS | Status: DC | PRN

## 2022-12-30 MED ORDER — LIDOCAINE 2% (20 MG/ML) 5 ML SYRINGE
INTRAMUSCULAR | Status: DC | PRN
  Administered 2022-12-30: 20 mg via INTRAVENOUS

## 2022-12-30 NOTE — Anesthesia Preprocedure Evaluation (Signed)
Anesthesia Evaluation  Patient identified by MRN, date of birth, ID band Patient awake    Reviewed: Allergy & Precautions, H&P , NPO status , Patient's Chart, lab work & pertinent test results  History of Anesthesia Complications (+) PONV and history of anesthetic complications  Airway Mallampati: II  TM Distance: >3 FB Neck ROM: full    Dental  (+) Dental Advisory Given, Teeth Intact   Pulmonary neg pulmonary ROS   breath sounds clear to auscultation       Cardiovascular negative cardio ROS  Rhythm:regular Rate:Normal     Neuro/Psych    GI/Hepatic ,GERD  ,,  Endo/Other    Renal/GU      Musculoskeletal  (+) Arthritis ,    Abdominal   Peds  Hematology  (+) Blood dyscrasia, anemia   Anesthesia Other Findings   Reproductive/Obstetrics                             Anesthesia Physical Anesthesia Plan  ASA: 2  Anesthesia Plan: MAC   Post-op Pain Management: Minimal or no pain anticipated   Induction: Intravenous  PONV Risk Score and Plan: 2 and Propofol infusion, Treatment may vary due to age or medical condition and TIVA  Airway Management Planned: Nasal Cannula  Additional Equipment:   Intra-op Plan:   Post-operative Plan:   Informed Consent: I have reviewed the patients History and Physical, chart, labs and discussed the procedure including the risks, benefits and alternatives for the proposed anesthesia with the patient or authorized representative who has indicated his/her understanding and acceptance.     Dental advisory given  Plan Discussed with: CRNA  Anesthesia Plan Comments:        Anesthesia Quick Evaluation

## 2022-12-30 NOTE — Interval H&P Note (Signed)
History and Physical Interval Note:  12/30/2022 1:18 PM  Nicole Johnson  has presented today for surgery, with the diagnosis of pancreatic cancer.  The various methods of treatment have been discussed with the patient and family. After consideration of risks, benefits and other options for treatment, the patient has consented to  Procedure(s): UPPER ESOPHAGEAL ENDOSCOPIC ULTRASOUND (EUS) (N/A) as a surgical intervention.  The patient's history has been reviewed, patient examined, no change in status, stable for surgery.  I have reviewed the patient's chart and labs.  Questions were answered to the patient's satisfaction.     , D

## 2022-12-30 NOTE — Anesthesia Procedure Notes (Signed)
Procedure Name: MAC Date/Time: 12/30/2022 1:45 PM  Performed by: Dorie Rank, CRNAPre-anesthesia Checklist: Patient identified, Emergency Drugs available, Suction available, Patient being monitored and Timeout performed Oxygen Delivery Method: Nasal cannula Preoxygenation: Pre-oxygenation with 100% oxygen Induction Type: IV induction Placement Confirmation: breath sounds checked- equal and bilateral and positive ETCO2 Dental Injury: Teeth and Oropharynx as per pre-operative assessment

## 2022-12-30 NOTE — Op Note (Signed)
[facility] Patient Name: Nicole Johnson Procedure Date : 12/30/2022 MRN: [id number] Attending MD: Jeani Hawking , MD, [id number] Date of Birth: 02-Dec-1960 CSN: [id number] Age: 61 Admit Type: Outpatient Procedure:                Upper EUS Indications:              Suspected solid pancreatic neoplasm Providers:                Jeani Hawking, MD, Martha Clan, RN, Harrington Challenger, Technician Referring MD:              Medicines:                Propofol per Anesthesia Complications:            No immediate complications. Estimated Blood Loss:     Estimated blood loss: none. Procedure:                Pre-Anesthesia Assessment:                           - Prior to the procedure, a History and Physical                            was performed, and patient medications and                            allergies were reviewed. The patient's tolerance of                            previous anesthesia was also reviewed. The risks                            and benefits of the procedure and the sedation                            options and risks were discussed with the patient.                            All questions were answered, and informed consent                            was obtained. Prior Anticoagulants: The patient has                            taken no anticoagulant or antiplatelet agents. ASA                            Grade Assessment: II - A patient with mild systemic                            disease. After reviewing the risks and benefits,  the patient was deemed in satisfactory condition to                            undergo the procedure.                           - Sedation was administered by an anesthesia                            professional. Deep sedation was attained.                           After obtaining informed consent, the endoscope was                            passed under direct vision.  Throughout the                            procedure, the patient's blood pressure, pulse, and                            oxygen saturations were monitored continuously. The                            [id number] ([id number] Olympus linear ultrasound scope                            was introduced through the mouth, and advanced to                            the second part of duodenum. The upper EUS was                            technically difficult and complex. The patient                            tolerated the procedure well. Scope In: Scope Out: Findings:      ENDOSONOGRAPHIC FINDING: :      An irregular mass was identified in the pancreatic head. The mass was       hypoechoic. The mass measured 32 mm by 42 mm in maximal cross-sectional       diameter. The endosonographic borders were poorly-defined. Fine needle       aspiration for cytology was performed. Color Doppler imaging was       utilized prior to needle puncture to confirm a lack of significant       vascular structures within the needle path. 12 with the 22 gauge needle       using a transduodenal approach. A stylet was used. A cytotechnologist       was present to evaluate the adequacy of the specimen. Final cytology       results are pending.      The objective today was to obtain tissue. The first 5 passes were       nondiagnostic. A second round of 5 passes was performed and then two  additional passes were made for cell block. Impression:               - A mass was identified in the pancreatic head.                            Fine needle aspiration performed. Recommendation:           - Patient has a contact number available for                            emergencies. The signs and symptoms of potential                            delayed complications were discussed with the                            patient. Return to normal activities tomorrow.                            Written discharge instructions were  provided to the                            patient.                           - Resume previous diet.                           - Await cytology results. Procedure Code(s):        --- Professional ---                           [phone], Esophagogastroduodenoscopy, flexible,                            transoral; with transendoscopic ultrasound-guided                            intramural or transmural fine needle                            aspiration/biopsy(s), (includes endoscopic                            ultrasound examination limited to the esophagus,                            stomach or duodenum, and adjacent structures) Diagnosis Code(s):        --- Professional ---                           K86.89, Other specified diseases of pancreas CPT copyright 2022 [employer]. All rights reserved. The codes documented in this report are preliminary and upon coder review may  be revised to meet current compliance requirements. Jeani Hawking, MD Jeani Hawking, MD 12/30/2022 3:23:29 PM This report has been signed electronically. Number of Addenda: 0

## 2022-12-30 NOTE — Transfer of Care (Signed)
Immediate Anesthesia Transfer of Care Note  Patient: Nicole Johnson  Procedure(s) Performed: UPPER ESOPHAGEAL ENDOSCOPIC ULTRASOUND (EUS) FINE NEEDLE ASPIRATION (FNA) LINEAR ESOPHAGOGASTRODUODENOSCOPY (EGD) WITH PROPOFOL  Patient Location: Endoscopy Unit  Anesthesia Type:MAC  Level of Consciousness: sedated  Airway & Oxygen Therapy: Patient connected to nasal cannula oxygen  Post-op Assessment: Post -op Vital signs reviewed and stable  Post vital signs: stable  Last Vitals:  Vitals Value Taken Time  BP 105/67 12/30/22 1504  Temp    Pulse 65 12/30/22 1504  Resp 8 12/30/22 1504  SpO2 99 % 12/30/22 1504  Vitals shown include unfiled device data.  Last Pain:  Vitals:   12/30/22 1240  TempSrc: Temporal  PainSc: 2          Complications: No notable events documented.

## 2022-12-30 NOTE — Discharge Instructions (Signed)

## 2022-12-31 NOTE — Anesthesia Postprocedure Evaluation (Signed)
Anesthesia Post Note  Patient: Nicole Johnson  Procedure(s) Performed: UPPER ESOPHAGEAL ENDOSCOPIC ULTRASOUND (EUS) FINE NEEDLE ASPIRATION (FNA) LINEAR ESOPHAGOGASTRODUODENOSCOPY (EGD) WITH PROPOFOL     Patient location during evaluation: PACU Anesthesia Type: MAC Level of consciousness: awake and alert Pain management: pain level controlled Vital Signs Assessment: post-procedure vital signs reviewed and stable Respiratory status: spontaneous breathing, nonlabored ventilation and respiratory function stable Cardiovascular status: blood pressure returned to baseline and stable Postop Assessment: no apparent nausea or vomiting Anesthetic complications: no   No notable events documented.  Last Vitals:  Vitals:   12/30/22 1510 12/30/22 1520  BP: (!) 108/59 (!) 105/93  Pulse: 66 63  Resp: 19 13  Temp:    SpO2: 98% 96%    Last Pain:  Vitals:   12/30/22 1520  TempSrc:   PainSc: 0-No pain   Pain Goal:                   Lowella Curb

## 2023-01-03 ENCOUNTER — Encounter (HOSPITAL_COMMUNITY): Payer: Self-pay | Admitting: Gastroenterology

## 2023-01-04 DIAGNOSIS — C250 Malignant neoplasm of head of pancreas: Secondary | ICD-10-CM

## 2023-01-04 NOTE — Progress Notes (Signed)
I spoke with Nicole Johnson she states she tested positive for covid this am with mild symptoms.  I reviewed her case with Dr Mosetta Putt.  I have scheduled her for a phone consultation with Dr Mosetta Putt on 01/06/2023 at 1500.  She is agreeable for [place] to order CT CAP.  I explained my role as a nurse navigator and provided my direct phone number should she have any questions or concerns.  All questions were answered.  She verbalized understanding.

## 2023-01-06 ENCOUNTER — Other Ambulatory Visit: Payer: BC Managed Care – PPO

## 2023-01-06 ENCOUNTER — Inpatient Hospital Stay: Payer: BC Managed Care – PPO | Attending: Hematology | Admitting: Hematology

## 2023-01-06 ENCOUNTER — Encounter: Payer: Self-pay | Admitting: Hematology

## 2023-01-06 DIAGNOSIS — Z801 Family history of malignant neoplasm of trachea, bronchus and lung: Secondary | ICD-10-CM | POA: Diagnosis not present

## 2023-01-06 DIAGNOSIS — C250 Malignant neoplasm of head of pancreas: Secondary | ICD-10-CM | POA: Insufficient documentation

## 2023-01-06 DIAGNOSIS — Z803 Family history of malignant neoplasm of breast: Secondary | ICD-10-CM | POA: Diagnosis not present

## 2023-01-06 DIAGNOSIS — Z808 Family history of malignant neoplasm of other organs or systems: Secondary | ICD-10-CM | POA: Insufficient documentation

## 2023-01-06 DIAGNOSIS — G893 Neoplasm related pain (acute) (chronic): Secondary | ICD-10-CM | POA: Insufficient documentation

## 2023-01-06 DIAGNOSIS — C259 Malignant neoplasm of pancreas, unspecified: Secondary | ICD-10-CM | POA: Insufficient documentation

## 2023-01-06 NOTE — Progress Notes (Signed)
[facility]   [phone] Fax:(336) [phone]   Clinic New Consult Note   Patient Care Team: Tower, Audrie Gallus, MD as PCP - General (Family Medicine)  Date of Service:  01/06/2023  I connected with Nicole Johnson on 01/06/2023 at  3:00 PM EDT by video and verified that I am speaking with the correct person using two identifiers.   I discussed the limitations, risks, security and privacy concerns of performing an evaluation and management service by telephone and the availability of in person appointments. I also discussed with the patient that there may be a patient responsible charge related to this service. The patient expressed understanding and agreed to proceed.   Patient's location:  [place]  Provider's location:  office    CHIEF COMPLAINTS/PURPOSE OF CONSULTATION:  Pancreatic Cancer  REFERRING PHYSICIAN:  Jeani Hawking, MD   ASSESSMENT & PLAN:  Nicole Johnson is a 62 y.o. female with a history of   1. Malignant neoplasm of head of pancreas (HCC), cT2N0Mx -Patient presented with jaundice, abdominal MRI showed a poorly differentiated hypervascular area in the head of pancreas, measuring 3.5 x 3.0 cm.  The mass unfortunately invades multiple vascular structures including celiac artery, the proximal SMA and SMV -Status post ERCP and biliary stent placement. Dr. Elnoria Howard performed EUS and fine-needle biopsy of the pancreatic mass twice, and the second biopsy confirmed adenocarcinoma.  Per EUS, the pancreatic mass does not appear involved celiac artery and SMA -Will obtain CT chest to complete staging, and get a pancreatic protocol CT abdomen pelvis for further evaluation of vascular involvement by the pancreatic cancer. -I discussed the above findings with patient and her husband in detail.  Her pancreatic cancer may not be resectable if it involves celiac artery, or at least borderline resectable.  Will review her case in our GI tumor board next week. -I discussed the  benefit of neoadjuvant chemotherapy, I discussed option of FOLFIRINOX/NALIRIFOX, gemcitabine and Abraxane. She would a candidate for intensive chemotherapy, I recommendNALIRIFOX -I also discussed the role of consolidation radiation if her cancer is not resectable. -She is recovering from COVID, plan to see her back in the next 1 to 2 weeks to finalize her treatment plan  COVID infection -Diagnosed on January 01, 2023, she has recovered overall with minimal residual symptoms   PLAN:  -recommend Genetic Testing -Discuss Whipple Surgery -present case in conference 8/21 -recommend CT Chest -Referral to dietician -lab and f/u in 1 week  Oncology History Overview Note   Cancer Staging  Pancreatic cancer [facility]) Staging form: Exocrine Pancreas, AJCC 8th Edition - Clinical: Stage IB (cT2, cN0, cM0) - Signed by Malachy Mood, MD on 01/06/2023 Total positive nodes: 0     Pancreatic cancer (HCC)  12/16/2022 Imaging   MR Abdomen MRCP W WO CONTAST   IMPRESSION: 1. Poorly defined infiltrative mass in the head of the pancreas with vascular involvement, and occlusion of the bile ducts in the region of the porta hepatis, as detailed above. Findings are highly concerning for primary pancreatic malignancy. Further evaluation with endoscopic ultrasound and tissue sampling should be considered. 2. Severe dilatation of the gallbladder. Probable obstruction of the cystic duct in the region of the porta hepatis. Gallbladder wall does not appear thickened or edematous, and there is no surrounding pericholecystic fluid or overt surrounding inflammatory changes to clearly indicate an acute cholecystitis at this time.     12/30/2022 Pathology Results    FINAL MICROSCOPIC DIAGNOSIS:  A. ESOPHAGEAL, PANCREATIC HEAD MASS, FINE  NEEDLE ASPIRATION:  - Adenocarcinoma    01/06/2023 Initial Diagnosis   Pancreatic cancer (HCC)   01/06/2023 Cancer Staging   Staging form: Exocrine Pancreas, AJCC 8th Edition -  Clinical: Stage IB (cT2, cN0, cM0) - Signed by Malachy Mood, MD on 01/06/2023 Total positive nodes: 0      HISTORY OF PRESENTING ILLNESS:  Nicole Johnson 62 y.o. female is a here because of Pancreatic Cancer. The patient was referred by Jeani Hawking, MD . The patient is contacted through video virtual visit, she is  accompanied by husband.  Patient presented with pruritus in early July 2024.  She was seen by her primary care physician, labs showed abnormal LFTs with elevated liver enzymes and bilirubin 4.0.  Abdominal ultrasound showed Normal-appearing of the gallbladder, mild dilatation of the common bile duct.  She underwent abdominal MRI with and without contrast on December 16, 2022, which showed a 3.5 x 3.0 cm mass in the head of the pancreas with multiple vascular structure involvement.  Patient was referred to GI Dr. Audley Hose, underwent ERCP and biliary stent placement.  EUS and FNA of pancreatic mass was attempted twice, the first cytology was negative, and the second cytology confirmed adenocarcinoma.  Pt stated that her jaundice is gone.  Since her stent placement, her appetite and energy level has improved.  Pt state that she is starting to have pain in her lower back. She rate pain between 2-3.  She state that her appetite is slowly coming back ,she had lost a total of 20 lbs.  She was diagnosed with COVID last Saturday, received Paxlovid.  She has recovered well with minimal symptoms now.  The husband was also diagnosed with COVID.   She has a PMHx of.... Diabetes Mellitus Brother-Cancer in the Neck Father-lung cancer Sister-Breast cancer  Socially... Married 1 son  REVIEW OF SYSTEMS:    Constitutional: (-)Denies fevers, chills or abnormal night sweats Eyes: (-)Denies blurriness of vision, double vision or watery eyes Ears, nose, mouth, throat, and face: Denies mucositis or sore throat Respiratory:(-) Denies cough, dyspnea or wheezes Cardiovascular: (-) Denies palpitation, chest  discomfort or lower extremity swelling Gastrointestinal:  (-) Denies nausea, heartburn or change in bowel habits Skin: (+) itchy skin Lymphatics: (-)Denies new lymphadenopathy or easy bruising Neurological:(-)Denies numbness, tingling or new weaknesses Behavioral/Psych:(-) Mood is stable, no new changes  All other systems were reviewed with the patient and are negative.   MEDICAL HISTORY:  Past Medical History:  Diagnosis Date   Arthritis    GERD (gastroesophageal reflux disease)    PONV (postoperative nausea and vomiting)    Prediabetes     SURGICAL HISTORY: Past Surgical History:  Procedure Laterality Date   BILIARY BRUSHING  12/17/2022   Procedure: BILIARY BRUSHING;  Surgeon: Jeani Hawking, MD;  Location: [facility];  Service: Gastroenterology;;   BILIARY STENT PLACEMENT N/A 12/17/2022   Procedure: BILIARY STENT PLACEMENT;  Surgeon: Jeani Hawking, MD;  Location: [facility];  Service: Gastroenterology;  Laterality: N/A;   ERCP N/A 12/17/2022   Procedure: ENDOSCOPIC RETROGRADE CHOLANGIOPANCREATOGRAPHY (ERCP);  Surgeon: Jeani Hawking, MD;  Location: [facility];  Service: Gastroenterology;  Laterality: N/A;   ESOPHAGOGASTRODUODENOSCOPY (EGD) WITH PROPOFOL N/A 12/18/2022   Procedure: ESOPHAGOGASTRODUODENOSCOPY (EGD) WITH PROPOFOL;  Surgeon: Jeani Hawking, MD;  Location: [facility];  Service: Gastroenterology;  Laterality: N/A;   ESOPHAGOGASTRODUODENOSCOPY (EGD) WITH PROPOFOL N/A 12/30/2022   Procedure: ESOPHAGOGASTRODUODENOSCOPY (EGD) WITH PROPOFOL;  Surgeon: Jeani Hawking, MD;  Location: [facility] END[facility]COPY;  Service: Gastroenterology;  Laterality: N/A;   EUS  N/A 12/18/2022   Procedure: UPPER ENDOSCOPIC ULTRASOUND (EUS) LINEAR;  Surgeon: Jeani Hawking, MD;  Location: [facility];  Service: Gastroenterology;  Laterality: N/A;   FINE NEEDLE ASPIRATION N/A 12/18/2022   Procedure: FINE NEEDLE ASPIRATION (FNA) LINEAR;  Surgeon: Jeani Hawking, MD;  Location: [facility];  Service:  Gastroenterology;  Laterality: N/A;   FINE NEEDLE ASPIRATION  12/30/2022   Procedure: FINE NEEDLE ASPIRATION (FNA) LINEAR;  Surgeon: Jeani Hawking, MD;  Location: [facility] ENDOSCOPY;  Service: Gastroenterology;;   LAPAROSCOPIC ABDOMINAL EXPLORATION     SPHINCTEROTOMY  12/17/2022   Procedure: Dennison Mascot;  Surgeon: Jeani Hawking, MD;  Location: [facility];  Service: Gastroenterology;;   UPPER ESOPHAGEAL ENDOSCOPIC ULTRASOUND (EUS) N/A 12/30/2022   Procedure: UPPER ESOPHAGEAL ENDOSCOPIC ULTRASOUND (EUS);  Surgeon: Jeani Hawking, MD;  Location: [facility] ENDOSCOPY;  Service: Gastroenterology;  Laterality: N/A;    SOCIAL HISTORY: Social History   Socioeconomic History   Marital status: Married    Spouse name: Not on file   Number of children: 1   Years of education: Not on file   Highest education level: Not on file  Occupational History   Not on file  Tobacco Use   Smoking status: Never   Smokeless tobacco: Never  Substance and Sexual Activity   Alcohol use: Yes    Comment: occ   Drug use: Never   Sexual activity: Yes    Partners: Male  Other Topics Concern   Not on file  Social History Narrative   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    Son is 81 yo and lives with them     Social Determinants of Health   Financial Resource Strain: Not on file  Food Insecurity: No Food Insecurity (12/20/2022)   Hunger Vital Sign    Worried About Running Out of Food in the Last Year: Never true    Ran Out of Food in the Last Year: Never true  Transportation Needs: No Transportation Needs (12/20/2022)   PRAPARE - Administrator, Civil Service (Medical): No    Lack of Transportation (Non-Medical): No  Physical Activity: Not on file  Stress: Not on file  Social Connections: Not on file  Intimate Partner Violence: Not At Risk (12/16/2022)   Humiliation, Afraid, Rape, and Kick questionnaire    Fear of Current or Ex-Partner: No    Emotionally Abused: No    Physically Abused: No     Sexually Abused: No    FAMILY HISTORY: Family History  Problem Relation Age of Onset   Cancer Mother        neck cancer   Diabetes Mother    Stroke Mother    Cancer Father        lung cancer   Diabetes Father    Diabetes Sister    Breast cancer Sister    Diabetes Brother    Heart disease Maternal Grandmother    Cancer Maternal Grandfather    Heart disease Maternal Grandfather     ALLERGIES:  is allergic to other, codeine, meloxicam, and tramadol.  MEDICATIONS:  Current Outpatient Medications  Medication Sig Dispense Refill   ADVIL 200 MG CAPS Take 400-600 mg by mouth every 6 (six) hours as needed (for pain).     Calcium Carb-Cholecalciferol (CALTRATE 600+D3 PO) Take 1 tablet by mouth daily with breakfast.     Ferrous Sulfate (IRON) 325 (65 Fe) MG TABS Take 325 mg by mouth daily with breakfast.     fluticasone (FLONASE) 50 MCG/ACT nasal spray Place 2  sprays into both nostrils daily. (Patient not taking: Reported on 12/16/2022) 16 g 6   MAGNESIUM PO Take 1 tablet by mouth daily.     multivitamin-lutein (OCUVITE-LUTEIN) CAPS capsule Take 1 capsule by mouth daily.     NON FORMULARY Apply 1 application  topically See admin instructions. Hot Cream  Muscle & Joint Cream/Capsaicin- based- Apply to painful or itchy areas as directed/as needed for relief     omeprazole (PRILOSEC) 20 MG capsule TAKE 1 CAPSULE(20 MG) BY MOUTH DAILY 90 capsule 1   predniSONE (DELTASONE) 20 MG tablet Take 2 tablets (40 mg total) by mouth daily. For 3 days--then 1 tab daily for 3 days 9 tablet 0   Pyridoxine HCl (VITAMIN B6 PO) Take 1 tablet by mouth daily.     triamcinolone cream (KENALOG) 0.1 % Apply 1 Application topically 2 (two) times daily as needed. (Patient not taking: Reported on 12/16/2022) 45 g 1   TURMERIC PO Take 2 capsules by mouth daily.     vitamin B-12 (CYANOCOBALAMIN) 500 MCG tablet Take 500 mcg by mouth daily.     ZYRTEC ALLERGY 10 MG tablet Take 10 mg by mouth daily.     No current  facility-administered medications for this visit.    PHYSICAL EXAMINATION: ECOG PERFORMANCE STATUS: 1 - Symptomatic but completely ambulatory  There were no vitals filed for this visit. There were no vitals filed for this visit.   GENERAL:alert, no distress and comfortable SKIN: skin color normal, no rashes or significant lesions EYES: normal, Conjunctiva are pink and non-injected, sclera clear  NEURO: alert & oriented x 3 with fluent speech  LABORATORY DATA:  I have reviewed the data as listed    Latest Ref Rng & Units 12/17/2022    2:42 PM 12/17/2022    5:19 AM 12/16/2022    2:57 PM  CBC  WBC 4.0 - 10.5 K/uL 2.0  2.2  2.7   Hemoglobin 12.0 - 15.0 g/dL 16.1  09.6  04.5   Hematocrit 36.0 - 46.0 % 34.3  35.2  36.0   Platelets 150 - 400 K/uL 100  106  119        Latest Ref Rng & Units 12/18/2022    7:50 AM 12/17/2022    2:42 PM 12/17/2022    5:19 AM  CMP  Glucose 70 - 99 mg/dL 409  811  914   BUN 8 - 23 mg/dL 16  15  12    Creatinine 0.44 - 1.00 mg/dL 7.82  9.56  2.13   Sodium 135 - 145 mmol/L 136  136  135   Potassium 3.5 - 5.1 mmol/L 4.1  4.3  3.8   Chloride 98 - 111 mmol/L 101  102  101   CO2 22 - 32 mmol/L 25  22  22    Calcium 8.9 - 10.3 mg/dL 8.9  9.0  9.3   Total Protein 6.5 - 8.1 g/dL 6.4  6.6  6.6   Total Bilirubin 0.3 - 1.2 mg/dL 5.5  08.6  57.8   Alkaline Phos 38 - 126 U/L 417  439  438   AST 15 - 41 U/L 331  401  350   ALT 0 - 44 U/L 597  628  592      RADIOGRAPHIC STUDIES: I have personally reviewed the radiological images as listed and agreed with the findings in the report. DG ERCP  Result Date: 12/17/2022 CLINICAL DATA:  Malignant stricture of the common bile duct. EXAM: ERCP TECHNIQUE: Multiple spot images  obtained with the fluoroscopic device and submitted for interpretation post-procedure. FLUOROSCOPY: Radiation Exposure Index (as provided by the fluoroscopic device): 42.9 mGy Kerma COMPARISON:  MRI 12/16/2022 FINDINGS: Wire was advanced into the main  pancreatic duct and common bile duct. Retrograde cholangiogram demonstrates dilated intrahepatic ducts and common hepatic duct. Placement of a plastic stent in the distal common bile duct. Round filling defect in the common hepatic duct on the final image probably represents a gas bubble. IMPRESSION: Dilated biliary system and placement of plastic biliary stent. These images were submitted for radiologic interpretation only. Electronically Signed   By: Richarda Overlie M.D.   On: 12/17/2022 15:40   MR ABDOMEN MRCP W WO CONTAST  Result Date: 12/17/2022 CLINICAL DATA:  62 year old female with history of jaundice. EXAM: MRI ABDOMEN WITHOUT AND WITH CONTRAST (INCLUDING MRCP) TECHNIQUE: Multiplanar multisequence MR imaging of the abdomen was performed both before and after the administration of intravenous contrast. Heavily T2-weighted images of the biliary and pancreatic ducts were obtained, and three-dimensional MRCP images were rendered by post processing. CONTRAST:  9mL GADAVIST GADOBUTROL 1 MMOL/ML IV SOLN COMPARISON:  No prior abdominal MRI. Abdominal ultrasound 12/10/2022. FINDINGS: Lower chest: Unremarkable. Hepatobiliary: No discrete cystic or solid hepatic lesions. MRCP images demonstrate mild intrahepatic biliary ductal dilatation with abrupt cut off of the common hepatic duct in the region of the hepatic hilum. There is also severe dilatation of the gallbladder. Abrupt cut off of the cystic duct in the region of the hepatic hilum also noted. Common bile duct is not well demonstrated and appears completely obscured in the region of the porta hepatis and head of the pancreas. Pancreas: There is a poorly defined hypovascular area in the region of the head of the pancreas, concerning for probable pancreatic mass. Given how ill-defined this is, it is difficult to provide an accurate measurement, however, the best estimate is on axial image 43 of series 15 measuring approximately 3.5 x 3.0 cm. This lesion causes  complete obscuration of the common bile duct in the porta hepatis and as it traverses the head of the pancreas. This lesion is intimately associated with multiple vascular structures including the celiac artery as it bifurcates, the proximal superior mesenteric artery (obliterating the intervening fat plane), the superior mesenteric vein which is severely narrowed if not completely occluded by the lesion, splenic vein which is severely narrowed if not completely occluded by the lesion, and the proximal portal vein which is narrowed and has a filling defect in it, which may represent thrombus or tumor thrombus (axial image 38 of series 19). Atrophy throughout the distal body and tail of the pancreas, with dilatation of the main pancreatic duct which measures up to 7 mm in the body of the pancreas. Spleen: Spleen is mildly enlarged measuring 9.4 x 5.5 x 17.8 cm (estimated splenic volume of 460 mL) . Adrenals/Urinary Tract: Subcentimeter T1 hypointense, T2 hyperintense, nonenhancing lesion in the upper pole of the left kidney is compatible with a tiny simple cyst (Bosniak class 1, no imaging follow-up recommended). Right kidney and bilateral adrenal glands are normal in appearance. No hydroureteronephrosis in the visualized portions of the abdomen. Stomach/Bowel: Visualized portions are unremarkable. Vascular/Lymphatic: Vascular findings pertinent to probable pancreatic malignancy, as detailed above. No aneurysm identified in the visualized abdominal vasculature. No definite lymphadenopathy confidently identified in the abdomen. Other: No significant volume of ascites noted in the visualized portions of the peritoneal cavity. Musculoskeletal: No aggressive appearing osseous lesions are noted in the visualized portions of the skeleton.  IMPRESSION: 1. Poorly defined infiltrative mass in the head of the pancreas with vascular involvement, and occlusion of the bile ducts in the region of the porta hepatis, as detailed  above. Findings are highly concerning for primary pancreatic malignancy. Further evaluation with endoscopic ultrasound and tissue sampling should be considered. 2. Severe dilatation of the gallbladder. Probable obstruction of the cystic duct in the region of the porta hepatis. Gallbladder wall does not appear thickened or edematous, and there is no surrounding pericholecystic fluid or overt surrounding inflammatory changes to clearly indicate an acute cholecystitis at this time. These results will be called to the ordering clinician or representative by the Radiologist Assistant, and communication documented in the PACS or [employer]. Electronically Signed   By: Trudie Reed M.D.   On: 12/17/2022 08:00   MR 3D Recon At Scanner  Result Date: 12/17/2022 CLINICAL DATA:  62 year old female with history of jaundice. EXAM: MRI ABDOMEN WITHOUT AND WITH CONTRAST (INCLUDING MRCP) TECHNIQUE: Multiplanar multisequence MR imaging of the abdomen was performed both before and after the administration of intravenous contrast. Heavily T2-weighted images of the biliary and pancreatic ducts were obtained, and three-dimensional MRCP images were rendered by post processing. CONTRAST:  9mL GADAVIST GADOBUTROL 1 MMOL/ML IV SOLN COMPARISON:  No prior abdominal MRI. Abdominal ultrasound 12/10/2022. FINDINGS: Lower chest: Unremarkable. Hepatobiliary: No discrete cystic or solid hepatic lesions. MRCP images demonstrate mild intrahepatic biliary ductal dilatation with abrupt cut off of the common hepatic duct in the region of the hepatic hilum. There is also severe dilatation of the gallbladder. Abrupt cut off of the cystic duct in the region of the hepatic hilum also noted. Common bile duct is not well demonstrated and appears completely obscured in the region of the porta hepatis and head of the pancreas. Pancreas: There is a poorly defined hypovascular area in the region of the head of the pancreas, concerning for probable  pancreatic mass. Given how ill-defined this is, it is difficult to provide an accurate measurement, however, the best estimate is on axial image 43 of series 15 measuring approximately 3.5 x 3.0 cm. This lesion causes complete obscuration of the common bile duct in the porta hepatis and as it traverses the head of the pancreas. This lesion is intimately associated with multiple vascular structures including the celiac artery as it bifurcates, the proximal superior mesenteric artery (obliterating the intervening fat plane), the superior mesenteric vein which is severely narrowed if not completely occluded by the lesion, splenic vein which is severely narrowed if not completely occluded by the lesion, and the proximal portal vein which is narrowed and has a filling defect in it, which may represent thrombus or tumor thrombus (axial image 38 of series 19). Atrophy throughout the distal body and tail of the pancreas, with dilatation of the main pancreatic duct which measures up to 7 mm in the body of the pancreas. Spleen: Spleen is mildly enlarged measuring 9.4 x 5.5 x 17.8 cm (estimated splenic volume of 460 mL) . Adrenals/Urinary Tract: Subcentimeter T1 hypointense, T2 hyperintense, nonenhancing lesion in the upper pole of the left kidney is compatible with a tiny simple cyst (Bosniak class 1, no imaging follow-up recommended). Right kidney and bilateral adrenal glands are normal in appearance. No hydroureteronephrosis in the visualized portions of the abdomen. Stomach/Bowel: Visualized portions are unremarkable. Vascular/Lymphatic: Vascular findings pertinent to probable pancreatic malignancy, as detailed above. No aneurysm identified in the visualized abdominal vasculature. No definite lymphadenopathy confidently identified in the abdomen. Other: No significant volume of  ascites noted in the visualized portions of the peritoneal cavity. Musculoskeletal: No aggressive appearing osseous lesions are noted in the  visualized portions of the skeleton. IMPRESSION: 1. Poorly defined infiltrative mass in the head of the pancreas with vascular involvement, and occlusion of the bile ducts in the region of the porta hepatis, as detailed above. Findings are highly concerning for primary pancreatic malignancy. Further evaluation with endoscopic ultrasound and tissue sampling should be considered. 2. Severe dilatation of the gallbladder. Probable obstruction of the cystic duct in the region of the porta hepatis. Gallbladder wall does not appear thickened or edematous, and there is no surrounding pericholecystic fluid or overt surrounding inflammatory changes to clearly indicate an acute cholecystitis at this time. These results will be called to the ordering clinician or representative by the Radiologist Assistant, and communication documented in the PACS or [employer]. Electronically Signed   By: Trudie Reed M.D.   On: 12/17/2022 08:00   US ABDOMEN LIMITED RUQ (LIVER/GB)  Result Date: 12/10/2022 CLINICAL DATA:  Increased LFTs. EXAM: ULTRASOUND ABDOMEN LIMITED RIGHT UPPER QUADRANT COMPARISON:  None Available. FINDINGS: Gallbladder: No gallstones or wall thickening visualized. No sonographic Murphy sign noted by sonographer. Wall thickness is within normal limits at 1.1 mm. Common bile duct: Diameter: 7.7 mm, slightly dilated. No obstructing lesion is present. Liver: The liver is diffusely echogenic. Loss of normal echotexture is noted. No focal lesions are present. Portal vein is patent on color Doppler imaging with normal direction of blood flow towards the liver. Other: None. IMPRESSION: 1. Normal appearance of the gallbladder. 2. Mild dilatation of the common bile duct without obstructing lesion. 3. Diffusely echogenic liver with poor definition of normal echotexture. Findings are nonspecific, but may represent hepatic steatosis. Electronically Signed   By: Marin Roberts M.D.   On: 12/10/2022 12:02     No  orders of the defined types were placed in this encounter.  I discussed the assessment and treatment plan with the patient. The patient was provided an opportunity to ask questions and all were answered. The patient agreed with the plan and demonstrated an understanding of the instructions.   The patient was advised to call back or seek an in-person evaluation if the symptoms worsen or if the condition fails to improve as anticipated.  I provided 45 minutes of face-to-face video visit time during this encounter, and > 50% was spent counseling as documented under my assessment & plan.     Malachy Mood, MD 01/06/2023  Carolin Coy am acting as scribe for Malachy Mood, MD.   I have reviewed the above documentation for accuracy and completeness, and I agree with the above.

## 2023-01-06 NOTE — Progress Notes (Signed)
I spoke with Nicole Johnson and gave her the date, time, location, and instructions for Ct CAP on 8/20 arrive 0715 at [facility] hospital.  Npo after 0330.  All questions were answered.  She verbalized understanding.

## 2023-01-07 ENCOUNTER — Other Ambulatory Visit: Payer: Self-pay

## 2023-01-07 ENCOUNTER — Other Ambulatory Visit: Payer: Self-pay | Admitting: Genetic Counselor

## 2023-01-07 ENCOUNTER — Encounter: Payer: Self-pay | Admitting: Hematology

## 2023-01-07 DIAGNOSIS — C250 Malignant neoplasm of head of pancreas: Secondary | ICD-10-CM

## 2023-01-07 DIAGNOSIS — Z1379 Encounter for other screening for genetic and chromosomal anomalies: Secondary | ICD-10-CM

## 2023-01-07 NOTE — Progress Notes (Signed)
I spoke with Nicole Johnson.  I reviewed appt location, date time and instructions for CT Chest, CT abd/pelvis pancreatic protocol.  I reviewed lab and f/y up appts with Dr Mosetta Putt and chemo education class.  All questions were answered.  She verbalized understanding.

## 2023-01-11 ENCOUNTER — Telehealth: Payer: Self-pay | Admitting: Genetic Counselor

## 2023-01-11 ENCOUNTER — Ambulatory Visit (HOSPITAL_COMMUNITY)
Admission: RE | Admit: 2023-01-11 | Discharge: 2023-01-11 | Disposition: A | Discharge status: Home / Self Care | Payer: BC Managed Care – PPO | Source: Ambulatory Visit | Attending: Hematology | Admitting: Hematology

## 2023-01-11 ENCOUNTER — Ambulatory Visit (HOSPITAL_COMMUNITY): Payer: BC Managed Care – PPO

## 2023-01-11 DIAGNOSIS — D7389 Other diseases of spleen: Secondary | ICD-10-CM | POA: Diagnosis not present

## 2023-01-11 DIAGNOSIS — C250 Malignant neoplasm of head of pancreas: Secondary | ICD-10-CM | POA: Insufficient documentation

## 2023-01-11 DIAGNOSIS — R590 Localized enlarged lymph nodes: Secondary | ICD-10-CM | POA: Diagnosis not present

## 2023-01-11 DIAGNOSIS — R161 Splenomegaly, not elsewhere classified: Secondary | ICD-10-CM | POA: Diagnosis not present

## 2023-01-11 MED ORDER — SODIUM CHLORIDE (PF) 0.9 % IJ SOLN
INTRAMUSCULAR | Status: AC
  Filled 2023-01-11: qty 50

## 2023-01-11 MED ORDER — IOHEXOL 300 MG/ML  SOLN
100.0000 mL | Freq: Once | INTRAMUSCULAR | Status: AC | PRN
  Administered 2023-01-11: 100 mL via INTRAVENOUS

## 2023-01-11 NOTE — Telephone Encounter (Signed)
Patient is aware of scheduled appointment times/dates regarding the [facility]

## 2023-01-12 ENCOUNTER — Other Ambulatory Visit: Payer: Self-pay | Admitting: *Deleted

## 2023-01-12 NOTE — Progress Notes (Signed)
The proposed treatment discussed in conference is for discussion purpose only and is not a binding recommendation.  The patients have not been physically examined, or presented with their treatment options.  Therefore, final treatment plans cannot be decided.  

## 2023-01-13 ENCOUNTER — Inpatient Hospital Stay: Payer: BC Managed Care – PPO | Admitting: Genetic Counselor

## 2023-01-13 ENCOUNTER — Encounter: Payer: Self-pay | Admitting: Genetic Counselor

## 2023-01-13 DIAGNOSIS — Z803 Family history of malignant neoplasm of breast: Secondary | ICD-10-CM

## 2023-01-13 DIAGNOSIS — Z801 Family history of malignant neoplasm of trachea, bronchus and lung: Secondary | ICD-10-CM

## 2023-01-13 DIAGNOSIS — Z808 Family history of malignant neoplasm of other organs or systems: Secondary | ICD-10-CM

## 2023-01-13 DIAGNOSIS — C250 Malignant neoplasm of head of pancreas: Secondary | ICD-10-CM

## 2023-01-13 NOTE — Progress Notes (Signed)
REFERRING PROVIDER: Malachy Mood, MD  PRIMARY PROVIDER:  Judy Pimple, MD  PRIMARY REASON FOR VISIT:  Encounter Diagnoses  Name Primary?   Malignant neoplasm of head of pancreas (HCC) Yes   Family history of breast cancer    HISTORY OF PRESENT ILLNESS:   Nicole Johnson, a 62 y.o. female, was seen for a [place] cancer genetics consultation at the request of Dr. Mosetta Putt due to a personal and family history of cancer.  Nicole Johnson presents to clinic today to discuss the possibility of a hereditary predisposition to cancer, to discuss genetic testing, and to further clarify her future cancer risks, as well as potential cancer risks for family members.   Nicole Johnson was diagnosed with pancreatic cancer at age 57.   I connected with Nicole Johnson on 01/13/2023 at 1:00 EDT by telephone and verified that I am speaking with the correct person using two identifiers.   Patient location: [place], [place] Provider location: [facility] [place], [place]  CANCER HISTORY:  Oncology History Overview Note   Cancer Staging  Pancreatic cancer [facility]) Staging form: Exocrine Pancreas, AJCC 8th Edition - Clinical: Stage IB (cT2, cN0, cM0) - Signed by Malachy Mood, MD on 01/06/2023 Total positive nodes: 0     Pancreatic cancer (HCC)  12/16/2022 Imaging   MR Abdomen MRCP W WO CONTAST   IMPRESSION: 1. Poorly defined infiltrative mass in the head of the pancreas with vascular involvement, and occlusion of the bile ducts in the region of the porta hepatis, as detailed above. Findings are highly concerning for primary pancreatic malignancy. Further evaluation with endoscopic ultrasound and tissue sampling should be considered. 2. Severe dilatation of the gallbladder. Probable obstruction of the cystic duct in the region of the porta hepatis. Gallbladder wall does not appear thickened or edematous, and there is no surrounding pericholecystic fluid or overt surrounding inflammatory changes to clearly indicate an  acute cholecystitis at this time.     12/30/2022 Pathology Results    FINAL MICROSCOPIC DIAGNOSIS:  A. ESOPHAGEAL, PANCREATIC HEAD MASS, FINE NEEDLE ASPIRATION:  - Adenocarcinoma    01/06/2023 Initial Diagnosis   Pancreatic cancer (HCC)   01/06/2023 Cancer Staging   Staging form: Exocrine Pancreas, AJCC 8th Edition - Clinical: Stage IB (cT2, cN0, cM0) - Signed by Malachy Mood, MD on 01/06/2023 Total positive nodes: 0      RISK FACTORS:  OCP use for approximately  20  years.  Ovaries intact: yes.  Uterus intact: yes.  Menopausal status: postmenopausal, 51  HRT use: 0 years. Colonoscopy: yes; normal colonoscopy in 2023 Mammogram within the last year: she reports she is scheduled for one next week Number of breast biopsies: 0. Any excessive radiation exposure in the past: no  Past Medical History:  Diagnosis Date   Arthritis    GERD (gastroesophageal reflux disease)    PONV (postoperative nausea and vomiting)    Prediabetes     Past Surgical History:  Procedure Laterality Date   BILIARY BRUSHING  12/17/2022   Procedure: BILIARY BRUSHING;  Surgeon: Jeani Hawking, MD;  Location: [facility];  Service: Gastroenterology;;   BILIARY STENT PLACEMENT N/A 12/17/2022   Procedure: BILIARY STENT PLACEMENT;  Surgeon: Jeani Hawking, MD;  Location: [facility];  Service: Gastroenterology;  Laterality: N/A;   ERCP N/A 12/17/2022   Procedure: ENDOSCOPIC RETROGRADE CHOLANGIOPANCREATOGRAPHY (ERCP);  Surgeon: Jeani Hawking, MD;  Location: [facility];  Service: Gastroenterology;  Laterality: N/A;   ESOPHAGOGASTRODUODENOSCOPY (EGD) WITH PROPOFOL N/A 12/18/2022   Procedure: ESOPHAGOGASTRODUODENOSCOPY (EGD) WITH  PROPOFOL;  Surgeon: Jeani Hawking, MD;  Location: [facility];  Service: Gastroenterology;  Laterality: N/A;   ESOPHAGOGASTRODUODENOSCOPY (EGD) WITH PROPOFOL N/A 12/30/2022   Procedure: ESOPHAGOGASTRODUODENOSCOPY (EGD) WITH PROPOFOL;  Surgeon: Jeani Hawking, MD;  Location: [facility] END[facility]COPY;   Service: Gastroenterology;  Laterality: N/A;   EUS N/A 12/18/2022   Procedure: UPPER ENDOSCOPIC ULTRASOUND (EUS) LINEAR;  Surgeon: Jeani Hawking, MD;  Location: [facility];  Service: Gastroenterology;  Laterality: N/A;   FINE NEEDLE ASPIRATION N/A 12/18/2022   Procedure: FINE NEEDLE ASPIRATION (FNA) LINEAR;  Surgeon: Jeani Hawking, MD;  Location: [facility];  Service: Gastroenterology;  Laterality: N/A;   FINE NEEDLE ASPIRATION  12/30/2022   Procedure: FINE NEEDLE ASPIRATION (FNA) LINEAR;  Surgeon: Jeani Hawking, MD;  Location[facility] END[facility]COPY;  Service: Gastroenterology;;   LAPAROSCOPIC ABDOMINAL EXPLORATION     SPHINCTEROTOMY  12/17/2022   Procedure: Dennison Mascot;  Surgeon: Jeani Hawking, MD;  Location: [facility];  Service: Gastroenterology;;   UPPER ESOPHAGEAL ENDOSCOPIC ULTRASOUND (EUS) N/A 12/30/2022   Procedure: UPPER ESOPHAGEAL ENDOSCOPIC ULTRASOUND (EUS);  Surgeon: Jeani Hawking, MD;  Location: [facility] ENDOSCOPY;  Service: Gastroenterology;  Laterality: N/A;    Social History   Socioeconomic History   Marital status: Married    Spouse name: Not on file   Number of children: 1   Years of education: Not on file   Highest education level: Not on file  Occupational History   Not on file  Tobacco Use   Smoking status: Never   Smokeless tobacco: Never  Substance and Sexual Activity   Alcohol use: Yes    Comment: occ   Drug use: Never   Sexual activity: Yes    Partners: Male  Other Topics Concern   Not on file  Social History Narrative   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    Son is 62 yo and lives with them     Social Determinants of Health   Financial Resource Strain: Not on file  Food Insecurity: No Food Insecurity (12/20/2022)   Hunger Vital Sign    Worried About Running Out of Food in the Last Year: Never true    Ran Out of Food in the Last Year: Never true  Transportation Needs: No Transportation Needs (12/20/2022)   PRAPARE - Scientist, research (physical sciences) (Medical): No    Lack of Transportation (Non-Medical): No  Physical Activity: Not on file  Stress: Not on file  Social Connections: Not on file     FAMILY HISTORY:  We obtained a detailed, 4-generation family history.  Significant diagnoses are listed below: Family History  Problem Relation Age of Onset   Diabetes Mother    Stroke Mother    Lung cancer Father 22       smoked   Diabetes Father    Diabetes Sister    Breast cancer Sister 40   Diabetes Brother    Cancer Brother 60       neck cancer   Heart disease Maternal Grandmother    Breast cancer Maternal Grandmother        dx. <50, double mastectomy   Heart disease Maternal Grandfather        Nicole Johnson is unaware of previous family history of genetic testing for hereditary cancer risks. There is no reported Ashkenazi Jewish ancestry.   GENETIC COUNSELING ASSESSMENT: Nicole Johnson is a 62 y.o. female with a personal and family history of cancer which is somewhat suggestive of a hereditary predisposition to cancer. We, therefore, discussed and  recommended the following at today's visit.   DISCUSSION: We discussed that 5 - 10% of cancer is hereditary, with most cases of pancreatic cancer associated with BRCA1/2.  There are other genes that can be associated with hereditary pancreatic cancer syndromes.  We discussed that testing is beneficial for several reasons including knowing how to follow individuals after completing their treatment, identifying whether potential treatment options would be beneficial, and understanding if other family members could be at risk for cancer and allowing them to undergo genetic testing.   We reviewed the characteristics, features and inheritance patterns of hereditary cancer syndromes. We also discussed genetic testing, including the appropriate family members to test, the process of testing, insurance coverage and turn-around-time for results. We discussed the implications of a negative,  positive, carrier and/or variant of uncertain significant result. We recommended Nicole Johnson pursue genetic testing for a panel that includes genes associated with breast and pancreatic cancer.   Nicole Johnson elected to have [facility] CancerNext-Expanded Panel. The CancerNext-Expanded gene panel offered by [facility] and includes sequencing, rearrangement, and RNA analysis for the following 71 genes: AIP, ALK, APC, ATM, AXIN2, BAP1, BARD1, BMPR1A, BRCA1, BRCA2, BRIP1, CDC73, CDH1, CDK4, CDKN1B, CDKN2A, CHEK2, CTNNA1, DICER1, FH, FLCN, KIF1B, LZTR1, MAX, MEN1, MET, MLH1, MSH2, MSH3, MSH6, MUTYH, NF1, NF2, NTHL1, PALB2, PHOX2B, PMS2, POT1, PRKAR1A, PTCH1, PTEN, RAD51C, RAD51D, RB1, RET, SDHA, SDHAF2, SDHB, SDHC, SDHD, SMAD4, SMARCA4, SMARCB1, SMARCE1, STK11, SUFU, TMEM127, TP53, TSC1, TSC2, and VHL (sequencing and deletion/duplication); EGFR, EGLN1, HOXB13, KIT, MITF, PDGFRA, POLD1, and POLE (sequencing only); EPCAM and GREM1 (deletion/duplication only).   Based on Nicole Johnson's personal and family history of cancer, she meets medical criteria for genetic testing. Despite that she meets criteria, she may still have an out of pocket cost. We discussed that if her out of pocket cost for testing is over $100, the laboratory will call and confirm whether she wants to proceed with testing.  If the out of pocket cost of testing is less than $100 she will be billed by the genetic testing laboratory.   PLAN: After considering the risks, benefits, and limitations, Nicole Johnson provided informed consent to pursue genetic testing and the blood sample was sent to [facility] for analysis of the CancerNext-Expanded Panel. Results should be available within approximately 2-3 weeks' time, at which point they will be disclosed by telephone to Nicole Johnson, as will any additional recommendations warranted by these results. Nicole Johnson will receive a summary of her genetic counseling visit and a copy of her results once available. This  information will also be available in Epic.   Nicole Johnson questions were answered to her satisfaction today. Our contact information was provided should additional questions or concerns arise. Thank you for the referral and allowing [place] to share in the care of your patient.   Lalla Brothers, MS, [facility] Genetic Counselor [email or web address] (P) [phone]  The patient was seen for a total of 20 minutes in phone genetic counseling. The patient was seen alone.  Drs. Pamelia Hoit and/or Mosetta Putt were available to discuss this case as needed.   _______________________________________________________________________ For Office Staff:  Number of people involved in session: 1 Was an Intern/ student involved with case: no

## 2023-01-14 ENCOUNTER — Inpatient Hospital Stay: Payer: BC Managed Care – PPO

## 2023-01-14 ENCOUNTER — Other Ambulatory Visit: Payer: Self-pay

## 2023-01-14 ENCOUNTER — Encounter: Payer: Self-pay | Admitting: Hematology

## 2023-01-14 ENCOUNTER — Other Ambulatory Visit (HOSPITAL_COMMUNITY): Payer: BC Managed Care – PPO

## 2023-01-14 ENCOUNTER — Inpatient Hospital Stay: Payer: BC Managed Care – PPO | Admitting: Hematology

## 2023-01-14 VITALS — BP 124/69 | HR 80 | Temp 97.9°F | Resp 18 | Ht 64.0 in | Wt 180.9 lb

## 2023-01-14 DIAGNOSIS — G893 Neoplasm related pain (acute) (chronic): Secondary | ICD-10-CM | POA: Diagnosis not present

## 2023-01-14 DIAGNOSIS — C250 Malignant neoplasm of head of pancreas: Secondary | ICD-10-CM

## 2023-01-14 DIAGNOSIS — Z801 Family history of malignant neoplasm of trachea, bronchus and lung: Secondary | ICD-10-CM | POA: Diagnosis not present

## 2023-01-14 DIAGNOSIS — Z803 Family history of malignant neoplasm of breast: Secondary | ICD-10-CM | POA: Diagnosis not present

## 2023-01-14 DIAGNOSIS — Z8507 Personal history of malignant neoplasm of pancreas: Secondary | ICD-10-CM | POA: Diagnosis not present

## 2023-01-14 DIAGNOSIS — Z1379 Encounter for other screening for genetic and chromosomal anomalies: Secondary | ICD-10-CM

## 2023-01-14 DIAGNOSIS — Z808 Family history of malignant neoplasm of other organs or systems: Secondary | ICD-10-CM | POA: Diagnosis not present

## 2023-01-14 LAB — CBC WITH DIFFERENTIAL (CANCER CENTER ONLY)
Abs Immature Granulocytes: 0.01 10*3/uL (ref 0.00–0.07)
Basophils Absolute: 0 10*3/uL (ref 0.0–0.1)
Basophils Relative: 1 %
Eosinophils Absolute: 0.1 10*3/uL (ref 0.0–0.5)
Eosinophils Relative: 2 %
HCT: 34.3 % — ABNORMAL LOW (ref 36.0–46.0)
Hemoglobin: 11 g/dL — ABNORMAL LOW (ref 12.0–15.0)
Immature Granulocytes: 0 %
Lymphocytes Relative: 21 %
Lymphs Abs: 0.7 10*3/uL (ref 0.7–4.0)
MCH: 27.5 pg (ref 26.0–34.0)
MCHC: 32.1 g/dL (ref 30.0–36.0)
MCV: 85.8 fL (ref 80.0–100.0)
Monocytes Absolute: 0.3 10*3/uL (ref 0.1–1.0)
Monocytes Relative: 8 %
Neutro Abs: 2.3 10*3/uL (ref 1.7–7.7)
Neutrophils Relative %: 68 %
Platelet Count: 103 10*3/uL — ABNORMAL LOW (ref 150–400)
RBC: 4 MIL/uL (ref 3.87–5.11)
RDW: 14 % (ref 11.5–15.5)
WBC Count: 3.3 10*3/uL — ABNORMAL LOW (ref 4.0–10.5)
nRBC: 0 % (ref 0.0–0.2)

## 2023-01-14 LAB — CMP (CANCER CENTER ONLY)
ALT: 29 U/L (ref 0–44)
AST: 25 U/L (ref 15–41)
Albumin: 4.1 g/dL (ref 3.5–5.0)
Alkaline Phosphatase: 99 U/L (ref 38–126)
Anion gap: 5 (ref 5–15)
BUN: 17 mg/dL (ref 8–23)
CO2: 29 mmol/L (ref 22–32)
Calcium: 9.7 mg/dL (ref 8.9–10.3)
Chloride: 107 mmol/L (ref 98–111)
Creatinine: 0.74 mg/dL (ref 0.44–1.00)
GFR, Estimated: >60 mL/min (ref >60)
Glucose, Bld: 120 mg/dL — ABNORMAL HIGH (ref 70–99)
Potassium: 5.5 mmol/L — ABNORMAL HIGH (ref 3.5–5.1)
Sodium: 141 mmol/L (ref 135–145)
Total Bilirubin: 1.1 mg/dL (ref 0.3–1.2)
Total Protein: 6.8 g/dL (ref 6.5–8.1)

## 2023-01-14 LAB — GENETIC SCREENING ORDER

## 2023-01-14 MED ORDER — PROCHLORPERAZINE MALEATE 10 MG PO TABS: 10.0000 mg | ORAL_TABLET | Freq: Four times a day (QID) | ORAL | 1 refills | Status: DC | PRN

## 2023-01-14 MED ORDER — LIDOCAINE-PRILOCAINE 2.5-2.5 % EX CREA
TOPICAL_CREAM | CUTANEOUS | 3 refills | Status: DC
Start: 2023-01-14 — End: 2023-03-28

## 2023-01-14 MED ORDER — ONDANSETRON HCL 8 MG PO TABS: 8.0000 mg | ORAL_TABLET | Freq: Three times a day (TID) | ORAL | 1 refills | Status: DC | PRN

## 2023-01-14 NOTE — Progress Notes (Signed)
START ON PATHWAY REGIMEN - Pancreatic Adenocarcinoma     A cycle is every 14 days:     Irinotecan      Oxaliplatin      Leucovorin      Fluorouracil   **Always confirm dose/schedule in your pharmacy ordering system**  Patient Characteristics: Locally Advanced, Anatomically Unresectable, M0, First Line, PS = 0,1, BRCA1/2 and PALB2 Mutation Absent/Unknown, Chemotherapy Therapeutic Status: Locally Advanced, Anatomically Unresectable, M0 Line of Therapy: First Line ECOG Performance Status: 1 BRCA1/2 Mutation Status: Awaiting Test Results PALB2 Mutation Status: Awaiting Test Results Intent of Therapy: Non-Curative / Palliative Intent, Discussed with Patient

## 2023-01-14 NOTE — Progress Notes (Signed)
[facility]   [phone] [phone]   Clinic Follow up Note   Patient Care Team: Tower, Audrie Gallus, MD as PCP - General (Family Medicine)  Date of Service:  01/14/2023  CHIEF COMPLAINT: f/u of pancreatic cancer  CURRENT THERAPY:  Pending first-line chemo NALIRIFOX every 2 weeks   ASSESSMENT:  Nicole Johnson is a 62 y.o. female with   Pancreatic cancer (HCC) cT2N0M0, stage IB, unresectable  -Patient presented with jaundice, abdominal MRI showed a poorly differentiated hypervascular area in the head of pancreas, measuring 3.5 x 3.0 cm.  The mass unfortunately invades multiple vascular structures including celiac artery, the proximal SMA and SMV -Status post ERCP and biliary stent placement. Dr. Elnoria Howard performed EUS and fine-needle biopsy of the pancreatic mass twice, and the second biopsy confirmed adenocarcinoma.  -Unfortunately her pancreatic mass is not resectable due to the invading celiac artery and SMA, case was discussed in GI tumor conference few days ago.  She also has a small lesion in the liver on the recent CT scan which was not seen on the previous MRI.  Plan to repeat MRI in 3 months.  I reviewed the above with patient and her husband in detail today. -We discussed the incurable nature of her cancer, and treatment options, she includes chemotherapy and radiation. -She has good performance status, I recommend NALIRIFOX as first-line chemotherapy.  We discussed chemotherapy for 3 to 6 months, followed by SBRT radiation, or continue chemotherapy until disease progression. -Chemotherapy consent: Side effects including but does not not limited to, fatigue, nausea, vomiting, diarrhea, hair loss, neuropathy, fluid retention, renal and kidney dysfunction, neutropenic fever, needed for blood transfusion, bleeding, were discussed with patient in great detail. She agrees to proceed. -She understands the goal of chemotherapy is palliative -Plan to start in 1 to  2 weeks.  Cancer related pain -She has a mild to moderate epigastric pain, for which she takes Advil, not does not help. -She had a poor tolerance to narcotics and tramadol in the past, does not want prescription pain medication for now.   PLAN: -Port placement by [employer] as soon as possible -First cycle NALIRIFOX after port placement -chemo class next week scheduled  -f/u with first cycle chemo    SUMMARY OF ONCOLOGIC HISTORY: Oncology History Overview Note   Cancer Staging  Pancreatic cancer [facility]) Staging form: Exocrine Pancreas, AJCC 8th Edition - Clinical: Stage IB (cT2, cN0, cM0) - Signed by Malachy Mood, MD on 01/06/2023 Total positive nodes: 0     Pancreatic cancer (HCC)  12/16/2022 Imaging   MR Abdomen MRCP W WO CONTAST   IMPRESSION: 1. Poorly defined infiltrative mass in the head of the pancreas with vascular involvement, and occlusion of the bile ducts in the region of the porta hepatis, as detailed above. Findings are highly concerning for primary pancreatic malignancy. Further evaluation with endoscopic ultrasound and tissue sampling should be considered. 2. Severe dilatation of the gallbladder. Probable obstruction of the cystic duct in the region of the porta hepatis. Gallbladder wall does not appear thickened or edematous, and there is no surrounding pericholecystic fluid or overt surrounding inflammatory changes to clearly indicate an acute cholecystitis at this time.     12/30/2022 Pathology Results    FINAL MICROSCOPIC DIAGNOSIS:  A. ESOPHAGEAL, PANCREATIC HEAD MASS, FINE NEEDLE ASPIRATION:  - Adenocarcinoma    01/06/2023 Initial Diagnosis   Pancreatic cancer (HCC)   01/06/2023 Cancer Staging   Staging form: Exocrine Pancreas, AJCC 8th Edition - Clinical:  Stage IB (cT2, cN0, cM0) - Signed by Malachy Mood, MD on 01/06/2023 Total positive nodes: 0   01/26/2023 -  Chemotherapy   Patient is on Treatment Plan : PANCREAS NALIRIFOX D1, 15 Q28D        INTERVAL  HISTORY:  IYUNNA Johnson is here for a follow up of pancreatic cancer. She was last seen by me on January 06, 2023. She presents to the clinic accompanied by her husband.  She is clinically stable, still has intermittent epigastric pain, for which she takes Advil.  She does not want any prescription pain medication for now.  No other new complaints.   All other systems were reviewed with the patient and are negative.  MEDICAL HISTORY:  Past Medical History:  Diagnosis Date   Arthritis    GERD (gastroesophageal reflux disease)    PONV (postoperative nausea and vomiting)    Prediabetes     SURGICAL HISTORY: Past Surgical History:  Procedure Laterality Date   BILIARY BRUSHING  12/17/2022   Procedure: BILIARY BRUSHING;  Surgeon: Jeani Hawking, MD;  Location: [facility];  Service: Gastroenterology;;   BILIARY STENT PLACEMENT N/A 12/17/2022   Procedure: BILIARY STENT PLACEMENT;  Surgeon: Jeani Hawking, MD;  Location: [facility];  Service: Gastroenterology;  Laterality: N/A;   ERCP N/A 12/17/2022   Procedure: ENDOSCOPIC RETROGRADE CHOLANGIOPANCREATOGRAPHY (ERCP);  Surgeon: Jeani Hawking, MD;  Location: [facility];  Service: Gastroenterology;  Laterality: N/A;   ESOPHAGOGASTRODUODENOSCOPY (EGD) WITH PROPOFOL N/A 12/18/2022   Procedure: ESOPHAGOGASTRODUODENOSCOPY (EGD) WITH PROPOFOL;  Surgeon: Jeani Hawking, MD;  Location: [facility];  Service: Gastroenterology;  Laterality: N/A;   ESOPHAGOGASTRODUODENOSCOPY (EGD) WITH PROPOFOL N/A 12/30/2022   Procedure: ESOPHAGOGASTRODUODENOSCOPY (EGD) WITH PROPOFOL;  Surgeon: Jeani Hawking, MD;  Location: [facility] END[facility]COPY;  Service: Gastroenterology;  Laterality: N/A;   EUS N/A 12/18/2022   Procedure: UPPER ENDOSCOPIC ULTRASOUND (EUS) LINEAR;  Surgeon: Jeani Hawking, MD;  Location: [facility];  Service: Gastroenterology;  Laterality: N/A;   FINE NEEDLE ASPIRATION N/A 12/18/2022   Procedure: FINE NEEDLE ASPIRATION (FNA) LINEAR;  Surgeon: Jeani Hawking, MD;   Location: [facility];  Service: Gastroenterology;  Laterality: N/A;   FINE NEEDLE ASPIRATION  12/30/2022   Procedure: FINE NEEDLE ASPIRATION (FNA) LINEAR;  Surgeon: Jeani Hawking, MD;  Location: [facility] ENDOSCOPY;  Service: Gastroenterology;;   LAPAROSCOPIC ABDOMINAL EXPLORATION     SPHINCTEROTOMY  12/17/2022   Procedure: Dennison Mascot;  Surgeon: Jeani Hawking, MD;  Location: [facility];  Service: Gastroenterology;;   UPPER ESOPHAGEAL ENDOSCOPIC ULTRASOUND (EUS) N/A 12/30/2022   Procedure: UPPER ESOPHAGEAL ENDOSCOPIC ULTRASOUND (EUS);  Surgeon: Jeani Hawking, MD;  Location: [facility] ENDOSCOPY;  Service: Gastroenterology;  Laterality: N/A;    I have reviewed the social history and family history with the patient and they are unchanged from previous note.  ALLERGIES:  is allergic to other, codeine, meloxicam, and tramadol.  MEDICATIONS:  Current Outpatient Medications  Medication Sig Dispense Refill   ADVIL 200 MG CAPS Take 400-600 mg by mouth every 6 (six) hours as needed (for pain).     Calcium Carb-Cholecalciferol (CALTRATE 600+D3 PO) Take 1 tablet by mouth daily with breakfast.     Ferrous Sulfate (IRON) 325 (65 Fe) MG TABS Take 325 mg by mouth daily with breakfast.     fluticasone (FLONASE) 50 MCG/ACT nasal spray Place 2 sprays into both nostrils daily. (Patient not taking: Reported on 12/16/2022) 16 g 6   lidocaine-prilocaine (EMLA) cream Apply to affected area once 30 g 3   MAGNESIUM PO Take 1 tablet by mouth daily.  multivitamin-lutein (OCUVITE-LUTEIN) CAPS capsule Take 1 capsule by mouth daily.     NON FORMULARY Apply 1 application  topically See admin instructions. Hot Cream  Muscle & Joint Cream/Capsaicin- based- Apply to painful or itchy areas as directed/as needed for relief     omeprazole (PRILOSEC) 20 MG capsule TAKE 1 CAPSULE(20 MG) BY MOUTH DAILY 90 capsule 1   ondansetron (ZOFRAN) 8 MG tablet Take 1 tablet (8 mg total) by mouth every 8 (eight) hours as needed for nausea or vomiting.  Start on the third day after irinotecan 30 tablet 1   predniSONE (DELTASONE) 20 MG tablet Take 2 tablets (40 mg total) by mouth daily. For 3 days--then 1 tab daily for 3 days 9 tablet 0   prochlorperazine (COMPAZINE) 10 MG tablet Take 1 tablet (10 mg total) by mouth every 6 (six) hours as needed for nausea or vomiting. 30 tablet 1   Pyridoxine HCl (VITAMIN B6 PO) Take 1 tablet by mouth daily.     triamcinolone cream (KENALOG) 0.1 % Apply 1 Application topically 2 (two) times daily as needed. (Patient not taking: Reported on 12/16/2022) 45 g 1   TURMERIC PO Take 2 capsules by mouth daily.     vitamin B-12 (CYANOCOBALAMIN) 500 MCG tablet Take 500 mcg by mouth daily.     ZYRTEC ALLERGY 10 MG tablet Take 10 mg by mouth daily.     No current facility-administered medications for this visit.    PHYSICAL EXAMINATION: ECOG PERFORMANCE STATUS: 1 - Symptomatic but completely ambulatory  Vitals:   01/14/23 1018  BP: 124/69  Pulse: 80  Resp: 18  Temp: 97.9 F (36.6 C)  SpO2: 100%   Wt Readings from Last 3 Encounters:  01/14/23 180 lb 14.4 oz (82.1 kg)  12/17/22 187 lb (84.8 kg)  12/07/22 197 lb (89.4 kg)     GENERAL:alert, no distress and comfortable SKIN: skin color, texture, turgor are normal, no rashes or significant lesions EYES: normal, Conjunctiva are pink and non-injected, sclera clear NECK: supple, thyroid normal size, non-tender, without nodularity LYMPH:  no palpable lymphadenopathy in the cervical, axillary  LUNGS: clear to auscultation and percussion with normal breathing effort HEART: regular rate & rhythm and no murmurs and no lower extremity edema ABDOMEN:abdomen soft, non-tender and normal bowel sounds Musculoskeletal:no cyanosis of digits and no clubbing  NEURO: alert & oriented x 3 with fluent speech, no focal motor/sensory deficits  LABORATORY DATA:  I have reviewed the data as listed    Latest Ref Rng & Units 01/14/2023    9:43 AM 12/17/2022    2:42 PM 12/17/2022     5:19 AM  CBC  WBC 4.0 - 10.5 K/uL 3.3  2.0  2.2   Hemoglobin 12.0 - 15.0 g/dL 16.1  09.6  04.5   Hematocrit 36.0 - 46.0 % 34.3  34.3  35.2   Platelets 150 - 400 K/uL 103  100  106         Latest Ref Rng & Units 01/14/2023    9:43 AM 12/18/2022    7:50 AM 12/17/2022    2:42 PM  CMP  Glucose 70 - 99 mg/dL 409  811  914   BUN 8 - 23 mg/dL 17  16  15    Creatinine 0.44 - 1.00 mg/dL 7.82  9.56  2.13   Sodium 135 - 145 mmol/L 141  136  136   Potassium 3.5 - 5.1 mmol/L 5.5  4.1  4.3   Chloride 98 - 111 mmol/L 107  101  102   CO2 22 - 32 mmol/L 29  25  22    Calcium 8.9 - 10.3 mg/dL 9.7  8.9  9.0   Total Protein 6.5 - 8.1 g/dL 6.8  6.4  6.6   Total Bilirubin 0.3 - 1.2 mg/dL 1.1  5.5  16.1   Alkaline Phos 38 - 126 U/L 99  417  439   AST 15 - 41 U/L 25  331  401   ALT 0 - 44 U/L 29  597  628       RADIOGRAPHIC STUDIES: I have personally reviewed the radiological images as listed and agreed with the findings in the report. No results found.    Orders Placed This Encounter  Procedures   CBC with Differential (Cancer Center Only)    Standing Status:   Future    Standing Expiration Date:   01/26/2024   CMP (Cancer Center only)    Standing Status:   Future    Standing Expiration Date:   01/26/2024   CBC with Differential (Cancer Center Only)    Standing Status:   Future    Standing Expiration Date:   02/09/2024   CMP (Cancer Center only)    Standing Status:   Future    Standing Expiration Date:   02/09/2024   CBC with Differential (Cancer Center Only)    Standing Status:   Future    Standing Expiration Date:   02/23/2024   CMP (Cancer Center only)    Standing Status:   Future    Standing Expiration Date:   02/23/2024   CBC with Differential (Cancer Center Only)    Standing Status:   Future    Standing Expiration Date:   03/08/2024   CMP (Cancer Center only)    Standing Status:   Future    Standing Expiration Date:   03/08/2024   Ambulatory Referral to [facility]    Referral  Priority:   Urgent    Referral Type:   Consultation    Referral Reason:   Specialty Services Required    Number of Visits Requested:   1   All questions were answered. The patient knows to call the clinic with any problems, questions or concerns. No barriers to learning was detected. The total time spent in the appointment was 40 minutes.     Malachy Mood, MD 01/14/2023

## 2023-01-14 NOTE — Assessment & Plan Note (Addendum)
cT2N0M0, stage IB, unresectable  -Patient presented with jaundice, abdominal MRI showed a poorly differentiated hypervascular area in the head of pancreas, measuring 3.5 x 3.0 cm.  The mass unfortunately invades multiple vascular structures including celiac artery, the proximal SMA and SMV -Status post ERCP and biliary stent placement. Dr. Elnoria Howard performed EUS and fine-needle biopsy of the pancreatic mass twice, and the second biopsy confirmed adenocarcinoma.  -Unfortunately her pancreatic mass is not resectable due to the invading celiac artery and SMA, case was discussed in GI tumor conference few days ago.  She also has a small lesion in the liver on the recent CT scan which was not seen on the previous MRI.  Plan to repeat MRI in 3 months.  I reviewed the above with patient and her husband in detail today. -We discussed the incurable nature of her cancer, and treatment options, she includes chemotherapy and radiation. -She has good performance status, I recommend NALIRIFOX as first-line chemotherapy.  We discussed chemotherapy for 3 to 6 months, followed by SBRT radiation, or continue chemotherapy until disease progression. -Chemotherapy consent: Side effects including but does not not limited to, fatigue, nausea, vomiting, diarrhea, hair loss, neuropathy, fluid retention, renal and kidney dysfunction, neutropenic fever, needed for blood transfusion, bleeding, were discussed with patient in great detail. She agrees to proceed. -She understands the goal of chemotherapy is palliative -Plan to start in 1 to 2 weeks.

## 2023-01-16 ENCOUNTER — Other Ambulatory Visit: Payer: Self-pay

## 2023-01-17 ENCOUNTER — Encounter: Payer: Self-pay | Admitting: Hematology

## 2023-01-17 DIAGNOSIS — C250 Malignant neoplasm of head of pancreas: Secondary | ICD-10-CM

## 2023-01-17 NOTE — Progress Notes (Signed)
01/14/2023 ov note faxed to Dr Elnoria Howard.

## 2023-01-17 NOTE — Progress Notes (Addendum)
Nicole Johnson requested a referral via mychart message for main [facility] surgery.   Nicole Johnson wanted to hold off on doing any treatment here until she sees them.  I encouraged her to keep the appts until we find out when her consult is with [facility]. Referral, last ov note, demographics, insurance info, pathology, EUS report faxed to [facility]. I spoke with Drusilla Kanner in [facility] radiology department and requested the following images be pushed to [facility]:  12/16/2022 MR A, 01/11/2023 Ct AP, pancreatic protocol, and 01/11/2023 Ct C.

## 2023-01-18 ENCOUNTER — Inpatient Hospital Stay: Payer: BC Managed Care – PPO

## 2023-01-18 DIAGNOSIS — C250 Malignant neoplasm of head of pancreas: Secondary | ICD-10-CM

## 2023-01-18 NOTE — Progress Notes (Signed)
I spoke with Nicole Johnson.  She is scheduled for consult with Dr Stephanie Coup at [facility] 01/27/2023.  I have cancelled her port placement and appts for 01/26/2023.  I will call her next week for an update on her decision on treatment here.

## 2023-01-19 ENCOUNTER — Other Ambulatory Visit: Payer: BC Managed Care – PPO

## 2023-01-19 ENCOUNTER — Other Ambulatory Visit (HOSPITAL_COMMUNITY): Payer: BC Managed Care – PPO

## 2023-01-19 ENCOUNTER — Ambulatory Visit
Admission: RE | Admit: 2023-01-19 | Discharge: 2023-01-19 | Disposition: A | Discharge status: Home / Self Care | Payer: BC Managed Care – PPO | Source: Ambulatory Visit | Attending: Obstetrics and Gynecology | Admitting: Obstetrics and Gynecology

## 2023-01-19 ENCOUNTER — Ambulatory Visit (HOSPITAL_COMMUNITY): Payer: BC Managed Care – PPO

## 2023-01-19 ENCOUNTER — Ambulatory Visit: Admission: RE | Admit: 2023-01-19 | Payer: BC Managed Care – PPO | Source: Ambulatory Visit

## 2023-01-19 DIAGNOSIS — N6315 Unspecified lump in the right breast, overlapping quadrants: Secondary | ICD-10-CM | POA: Diagnosis not present

## 2023-01-19 DIAGNOSIS — N6310 Unspecified lump in the right breast, unspecified quadrant: Secondary | ICD-10-CM

## 2023-01-20 ENCOUNTER — Telehealth: Payer: Self-pay | Admitting: Hematology

## 2023-01-23 ENCOUNTER — Other Ambulatory Visit: Payer: Self-pay

## 2023-01-26 ENCOUNTER — Inpatient Hospital Stay: Payer: BC Managed Care – PPO

## 2023-01-26 ENCOUNTER — Inpatient Hospital Stay: Payer: BC Managed Care – PPO | Admitting: Hematology

## 2023-01-26 ENCOUNTER — Ambulatory Visit: Payer: BC Managed Care – PPO | Admitting: Nurse Practitioner

## 2023-01-26 ENCOUNTER — Other Ambulatory Visit: Payer: BC Managed Care – PPO

## 2023-01-26 ENCOUNTER — Ambulatory Visit: Payer: BC Managed Care – PPO

## 2023-01-27 DIAGNOSIS — C251 Malignant neoplasm of body of pancreas: Secondary | ICD-10-CM | POA: Diagnosis not present

## 2023-01-27 DIAGNOSIS — C250 Malignant neoplasm of head of pancreas: Secondary | ICD-10-CM | POA: Diagnosis not present

## 2023-01-28 ENCOUNTER — Telehealth: Payer: Self-pay | Admitting: Genetic Counselor

## 2023-01-28 ENCOUNTER — Encounter: Payer: Self-pay | Admitting: Genetic Counselor

## 2023-01-28 DIAGNOSIS — Z1379 Encounter for other screening for genetic and chromosomal anomalies: Secondary | ICD-10-CM | POA: Insufficient documentation

## 2023-01-28 DIAGNOSIS — C250 Malignant neoplasm of head of pancreas: Secondary | ICD-10-CM | POA: Diagnosis not present

## 2023-01-28 NOTE — Telephone Encounter (Signed)
I contacted Ms. Torgeson to discuss her genetic testing results. No pathogenic variants were identified in the 71 genes analyzed. Detailed clinic note to follow.  The test report has been scanned into EPIC and is located under the Molecular Pathology section of the Results Review tab.  A portion of the result report is included below for reference.   Lalla Brothers, MS, [facility] Genetic Counselor [email or web address] (P) [phone]

## 2023-01-31 DIAGNOSIS — C250 Malignant neoplasm of head of pancreas: Secondary | ICD-10-CM | POA: Diagnosis not present

## 2023-02-01 DIAGNOSIS — C250 Malignant neoplasm of head of pancreas: Secondary | ICD-10-CM | POA: Diagnosis not present

## 2023-02-01 NOTE — Progress Notes (Signed)
I spoke with Nicole Johnson and verified she is getting treatment at [facility].

## 2023-02-02 ENCOUNTER — Ambulatory Visit: Payer: Self-pay | Admitting: Genetic Counselor

## 2023-02-02 ENCOUNTER — Encounter: Payer: Self-pay | Admitting: Genetic Counselor

## 2023-02-02 DIAGNOSIS — Z1379 Encounter for other screening for genetic and chromosomal anomalies: Secondary | ICD-10-CM

## 2023-02-02 NOTE — Progress Notes (Signed)
HPI:   Nicole Johnson was previously seen in the  Cancer Genetics clinic due to a personal and family history of cancer and concerns regarding a hereditary predisposition to cancer. Please refer to our prior cancer genetics clinic note for more information regarding our discussion, assessment and recommendations, at the time. Nicole Johnson recent genetic test results were disclosed to her, as were recommendations warranted by these results. These results and recommendations are discussed in more detail below.  CANCER HISTORY:  Oncology History Overview Note   Cancer Staging  Pancreatic cancer [facility]) Staging form: Exocrine Pancreas, AJCC 8th Edition - Clinical: Stage IB (cT2, cN0, cM0) - Signed by Malachy Mood, MD on 01/06/2023 Total positive nodes: 0     Pancreatic cancer (HCC)  12/16/2022 Imaging   MR Abdomen MRCP W WO CONTAST   IMPRESSION: 1. Poorly defined infiltrative mass in the head of the pancreas with vascular involvement, and occlusion of the bile ducts in the region of the porta hepatis, as detailed above. Findings are highly concerning for primary pancreatic malignancy. Further evaluation with endoscopic ultrasound and tissue sampling should be considered. 2. Severe dilatation of the gallbladder. Probable obstruction of the cystic duct in the region of the porta hepatis. Gallbladder wall does not appear thickened or edematous, and there is no surrounding pericholecystic fluid or overt surrounding inflammatory changes to clearly indicate an acute cholecystitis at this time.     12/30/2022 Pathology Results    FINAL MICROSCOPIC DIAGNOSIS:  A. ESOPHAGEAL, PANCREATIC HEAD MASS, FINE NEEDLE ASPIRATION:  - Adenocarcinoma    01/06/2023 Initial Diagnosis   Pancreatic cancer (HCC)   01/06/2023 Cancer Staging   Staging form: Exocrine Pancreas, AJCC 8th Edition - Clinical: Stage IB (cT2, cN0, cM0) - Signed by Malachy Mood, MD on 01/06/2023 Total positive nodes: 0   01/26/2023 -   Chemotherapy   Patient is on Treatment Plan : PANCREAS NALIRIFOX D1, 15 Q28D      Genetic Testing   [facility] CancerNext-Expanded Panel+RNA was Negative. Report date is 01/21/2023.   The CancerNext-Expanded gene panel offered by [facility] and includes sequencing, rearrangement, and RNA analysis for the following 71 genes: AIP, ALK, APC, ATM, AXIN2, BAP1, BARD1, BMPR1A, BRCA1, BRCA2, BRIP1, CDC73, CDH1, CDK4, CDKN1B, CDKN2A, CHEK2, CTNNA1, DICER1, FH, FLCN, KIF1B, LZTR1, MAX, MEN1, MET, MLH1, MSH2, MSH3, MSH6, MUTYH, NF1, NF2, NTHL1, PALB2, PHOX2B, PMS2, POT1, PRKAR1A, PTCH1, PTEN, RAD51C, RAD51D, RB1, RET, SDHA, SDHAF2, SDHB, SDHC, SDHD, SMAD4, SMARCA4, SMARCB1, SMARCE1, STK11, SUFU, TMEM127, TP53, TSC1, TSC2, and VHL (sequencing and deletion/duplication); EGFR, EGLN1, HOXB13, KIT, MITF, PDGFRA, POLD1, and POLE (sequencing only); EPCAM and GREM1 (deletion/duplication only).      FAMILY HISTORY:  We obtained a detailed, 4-generation family history.  Significant diagnoses are listed below:      Family History  Problem Relation Age of Onset   Diabetes Mother     Stroke Mother     Lung cancer Father 13        smoked   Diabetes Father     Diabetes Sister     Breast cancer Sister 57   Diabetes Brother     Cancer Brother 60        neck cancer   Heart disease Maternal Grandmother     Breast cancer Maternal Grandmother          dx. <50, double mastectomy   Heart disease Maternal Grandfather                 Nicole Johnson is  unaware of previous family history of genetic testing for hereditary cancer risks. There is no reported Ashkenazi Jewish ancestry.    GENETIC TEST RESULTS:  The [facility] CancerNext-Expanded Panel found no pathogenic mutations.   The CancerNext-Expanded gene panel offered by [facility] and includes sequencing, rearrangement, and RNA analysis for the following 71 genes: AIP, ALK, APC, ATM, AXIN2, BAP1, BARD1, BMPR1A, BRCA1, BRCA2, BRIP1, CDC73, CDH1, CDK4, CDKN1B,  CDKN2A, CHEK2, CTNNA1, DICER1, FH, FLCN, KIF1B, LZTR1, MAX, MEN1, MET, MLH1, MSH2, MSH3, MSH6, MUTYH, NF1, NF2, NTHL1, PALB2, PHOX2B, PMS2, POT1, PRKAR1A, PTCH1, PTEN, RAD51C, RAD51D, RB1, RET, SDHA, SDHAF2, SDHB, SDHC, SDHD, SMAD4, SMARCA4, SMARCB1, SMARCE1, STK11, SUFU, TMEM127, TP53, TSC1, TSC2, and VHL (sequencing and deletion/duplication); EGFR, EGLN1, HOXB13, KIT, MITF, PDGFRA, POLD1, and POLE (sequencing only); EPCAM and GREM1 (deletion/duplication only).   The test report has been scanned into EPIC and is located under the Molecular Pathology section of the Results Review tab.  A portion of the result report is included below for reference. Genetic testing reported out on 01/21/2023.       Even though a pathogenic variant was not identified, possible explanations for the cancer in the family may include: There may be no hereditary risk for cancer in the family. The cancers in Nicole Johnson and/or her family may be due to other genetic or environmental factors. There may be a gene mutation in one of these genes that current testing methods cannot detect, but that chance is small. There could be another gene that has not yet been discovered, or that we have not yet tested, that is responsible for the cancer diagnoses in the family.  It is also possible there is a hereditary cause for the cancer in the family that Nicole Johnson did not inherit.  Therefore, it is important to remain in touch with cancer genetics in the future so that we can continue to offer Nicole Johnson the most up to date genetic testing.   ADDITIONAL GENETIC TESTING:  We discussed with Nicole Johnson that her genetic testing was fairly extensive.  If there are genes identified to increase cancer risk that can be analyzed in the future, we would be happy to discuss and coordinate this testing at that time.    CANCER SCREENING RECOMMENDATIONS:  Nicole Johnson test result is considered negative (normal).  This means that we have not identified a  hereditary cause for her personal and family history of cancer at this time.   An individual's cancer risk and medical management are not determined by genetic test results alone. Overall cancer risk assessment incorporates additional factors, including personal medical history, family history, and any available genetic information that may result in a personalized plan for cancer prevention and surveillance. Therefore, it is recommended she continue to follow the cancer management and screening guidelines provided by her oncology and primary healthcare provider.  Based on the reported personal and family history, specific cancer screenings for Ms. Dimas Chyle and her family include:  Breast Cancer Screening:  Multiple prediction models have been developed to assist with breast cancer risk prediction efforts for unaffected women without a known single gene risk factor identified in their family. The Tyrer-Cuzick model is one such risk assessment tool. This model was developed to include extensive family history information, endogenous estrogen exposure, and benign breast disease. The calculation is highly-dependent on the accuracy of clinical data provided by the patient. Other factors not accounted for in the calculation may impact lifetime breast cancer risk including, but not  limited to, germline mutations not analyzed by the ordered genetic test or clinical information not provided at the time of the testing. The risk number provided is patient-specific and cannot be used to infer risk to relatives.  Ms. Vasicek Tyrer-Cuzick risk score is 11.8%. She is encouraged to continue to be mindful of her family history and be diligent with general population breast screening, including annual mammograms beginning 10 years prior to the youngest diagnosis in her family or by age 48.  She is encouraged to contact us regarding any changes to her personal or family history, as her recommendations for screening would  be altered significantly if her lifetime risk is determined to be greater than 20% based on updated information.    RECOMMENDATIONS FOR FAMILY MEMBERS:   Since she did not inherit a mutation in a cancer predisposition gene included on this panel, her son could not have inherited a mutation from her in one of these genes. Individuals in this family might be at some increased risk of developing cancer, over the general population risk, due to the family history of cancer. We recommend women in this family have a yearly mammogram beginning at age 20, or 24 years younger than the earliest onset of cancer, an annual clinical breast exam, and perform monthly breast self-exams.  Other members of the family may still carry a pathogenic variant in one of these genes that Ms. Amaral did not inherit. Based on the family history, we recommend her sister, who was diagnosed with breast cancer, have genetic counseling and testing.   FOLLOW-UP:  Cancer genetics is a rapidly advancing field and it is possible that new genetic tests will be appropriate for her and/or her family members in the future. We encouraged her to remain in contact with cancer genetics on an annual basis so we can update her personal and family histories and let her know of advances in cancer genetics that may benefit this family.   Our contact number was provided. Ms. Liljedahl questions were answered to her satisfaction, and she knows she is welcome to call us at anytime with additional questions or concerns.   Lalla Brothers, MS, [facility] Genetic Counselor [email or web address] (P) [phone]

## 2023-02-04 ENCOUNTER — Encounter: Payer: Self-pay | Admitting: Family Medicine

## 2023-02-04 DIAGNOSIS — C250 Malignant neoplasm of head of pancreas: Secondary | ICD-10-CM | POA: Diagnosis not present

## 2023-02-09 ENCOUNTER — Inpatient Hospital Stay: Payer: BC Managed Care – PPO

## 2023-02-09 ENCOUNTER — Inpatient Hospital Stay: Payer: BC Managed Care – PPO | Admitting: Nurse Practitioner

## 2023-02-09 ENCOUNTER — Inpatient Hospital Stay: Payer: BC Managed Care – PPO | Admitting: Dietician

## 2023-02-11 ENCOUNTER — Inpatient Hospital Stay: Payer: BC Managed Care – PPO

## 2023-02-11 DIAGNOSIS — Z87891 Personal history of nicotine dependence: Secondary | ICD-10-CM | POA: Diagnosis not present

## 2023-02-11 DIAGNOSIS — R6881 Early satiety: Secondary | ICD-10-CM | POA: Diagnosis not present

## 2023-02-11 DIAGNOSIS — R918 Other nonspecific abnormal finding of lung field: Secondary | ICD-10-CM | POA: Diagnosis not present

## 2023-02-11 DIAGNOSIS — Z79899 Other long term (current) drug therapy: Secondary | ICD-10-CM | POA: Diagnosis not present

## 2023-02-11 DIAGNOSIS — M549 Dorsalgia, unspecified: Secondary | ICD-10-CM | POA: Diagnosis not present

## 2023-02-11 DIAGNOSIS — R630 Anorexia: Secondary | ICD-10-CM | POA: Diagnosis not present

## 2023-02-11 DIAGNOSIS — Z5111 Encounter for antineoplastic chemotherapy: Secondary | ICD-10-CM | POA: Diagnosis not present

## 2023-02-11 DIAGNOSIS — Z6829 Body mass index (BMI) 29.0-29.9, adult: Secondary | ICD-10-CM | POA: Diagnosis not present

## 2023-02-11 DIAGNOSIS — R161 Splenomegaly, not elsewhere classified: Secondary | ICD-10-CM | POA: Diagnosis not present

## 2023-02-11 DIAGNOSIS — R21 Rash and other nonspecific skin eruption: Secondary | ICD-10-CM | POA: Diagnosis not present

## 2023-02-11 DIAGNOSIS — D61818 Other pancytopenia: Secondary | ICD-10-CM | POA: Diagnosis not present

## 2023-02-11 DIAGNOSIS — K30 Functional dyspepsia: Secondary | ICD-10-CM | POA: Diagnosis not present

## 2023-02-11 DIAGNOSIS — C250 Malignant neoplasm of head of pancreas: Secondary | ICD-10-CM | POA: Diagnosis not present

## 2023-02-23 ENCOUNTER — Ambulatory Visit: Payer: BC Managed Care – PPO

## 2023-02-23 ENCOUNTER — Ambulatory Visit: Payer: BC Managed Care – PPO | Admitting: Hematology

## 2023-02-23 ENCOUNTER — Other Ambulatory Visit: Payer: BC Managed Care – PPO

## 2023-02-25 DIAGNOSIS — C250 Malignant neoplasm of head of pancreas: Secondary | ICD-10-CM | POA: Diagnosis not present

## 2023-02-25 DIAGNOSIS — Z79899 Other long term (current) drug therapy: Secondary | ICD-10-CM | POA: Diagnosis not present

## 2023-02-25 DIAGNOSIS — Z87891 Personal history of nicotine dependence: Secondary | ICD-10-CM | POA: Diagnosis not present

## 2023-02-25 DIAGNOSIS — Z5111 Encounter for antineoplastic chemotherapy: Secondary | ICD-10-CM | POA: Diagnosis not present

## 2023-03-08 ENCOUNTER — Other Ambulatory Visit: Payer: BC Managed Care – PPO

## 2023-03-09 ENCOUNTER — Other Ambulatory Visit: Payer: BC Managed Care – PPO

## 2023-03-09 ENCOUNTER — Ambulatory Visit: Payer: BC Managed Care – PPO

## 2023-03-09 ENCOUNTER — Ambulatory Visit: Payer: BC Managed Care – PPO | Admitting: Hematology

## 2023-03-11 DIAGNOSIS — R21 Rash and other nonspecific skin eruption: Secondary | ICD-10-CM | POA: Diagnosis not present

## 2023-03-11 DIAGNOSIS — R197 Diarrhea, unspecified: Secondary | ICD-10-CM | POA: Diagnosis not present

## 2023-03-11 DIAGNOSIS — Z87891 Personal history of nicotine dependence: Secondary | ICD-10-CM | POA: Diagnosis not present

## 2023-03-11 DIAGNOSIS — C250 Malignant neoplasm of head of pancreas: Secondary | ICD-10-CM | POA: Diagnosis not present

## 2023-03-14 ENCOUNTER — Encounter: Payer: Self-pay | Admitting: Family Medicine

## 2023-03-14 ENCOUNTER — Ambulatory Visit (INDEPENDENT_AMBULATORY_CARE_PROVIDER_SITE_OTHER): Payer: BC Managed Care – PPO | Admitting: Family Medicine

## 2023-03-14 VITALS — BP 108/70 | HR 94 | Temp 97.8°F | Ht 64.75 in | Wt 158.0 lb

## 2023-03-14 DIAGNOSIS — E538 Deficiency of other specified B group vitamins: Secondary | ICD-10-CM

## 2023-03-14 DIAGNOSIS — D649 Anemia, unspecified: Secondary | ICD-10-CM

## 2023-03-14 DIAGNOSIS — Z79899 Other long term (current) drug therapy: Secondary | ICD-10-CM

## 2023-03-14 DIAGNOSIS — R7303 Prediabetes: Secondary | ICD-10-CM

## 2023-03-14 DIAGNOSIS — K219 Gastro-esophageal reflux disease without esophagitis: Secondary | ICD-10-CM

## 2023-03-14 DIAGNOSIS — Z13220 Encounter for screening for lipoid disorders: Secondary | ICD-10-CM | POA: Diagnosis not present

## 2023-03-14 DIAGNOSIS — Z0000 Encounter for general adult medical examination without abnormal findings: Secondary | ICD-10-CM | POA: Diagnosis not present

## 2023-03-14 DIAGNOSIS — C250 Malignant neoplasm of head of pancreas: Secondary | ICD-10-CM

## 2023-03-14 LAB — VITAMIN B12: Vitamin B-12: >1537 pg/mL — ABNORMAL HIGH (ref 211–911)

## 2023-03-14 LAB — LIPID PANEL
Cholesterol: 123 mg/dL (ref 0–200)
HDL: 46.2 mg/dL (ref >39.00)
LDL Cholesterol: 58 mg/dL (ref 0–99)
NonHDL: 76.55
Total CHOL/HDL Ratio: 3
Triglycerides: 91 mg/dL (ref 0.0–149.0)
VLDL: 18.2 mg/dL (ref 0.0–40.0)

## 2023-03-14 LAB — VITAMIN D 25 HYDROXY (VIT D DEFICIENCY, FRACTURES): VITD: 30.37 ng/mL (ref 30.00–100.00)

## 2023-03-14 LAB — HEMOGLOBIN A1C: Hgb A1c MFr Bld: 6.2 % (ref 4.6–6.5)

## 2023-03-14 LAB — TSH: TSH: 0.94 u[IU]/mL (ref 0.35–5.50)

## 2023-03-14 NOTE — Assessment & Plan Note (Signed)
Reviewed health habits including diet and exercise and skin cancer prevention Reviewed appropriate screening tests for age  Also reviewed health mt list, fam hx and immunization status , as well as social and family history   See HPI Labs reviewed and ordered In cancer treatment currently  Considering RSV vaccine in pharmacy  Due for gyn visit for pap as well/she plans to schedule Colonoscopy 10/2021  Discussed fall prevention, supplements and exercise for bone density  PHQ 0

## 2023-03-14 NOTE — Assessment & Plan Note (Signed)
B12 and D levels added to labs  Last chem labs reviewed from [facility]

## 2023-03-14 NOTE — Assessment & Plan Note (Signed)
Controlled well with omeprazole 20 mg bid  Has tried to stop but had much rebound symptoms B12 and magnesium levels added to labs in light of ppi

## 2023-03-14 NOTE — Assessment & Plan Note (Signed)
B12 today   Oral supplementation On ppi

## 2023-03-14 NOTE — Patient Instructions (Addendum)
I think RSV vaccine is a good idea  Ask at the pharmacy    When things settle down -get back to the gyn   Exercise on days you feel good Add some strength training to your routine, this is important for bone and brain health and can reduce your risk of falls and help your body use insulin properly and regulate weight  Light weights, exercise bands , and internet videos are a good way to start  Yoga (chair or regular), machines , floor exercises or a gym with machines are also good options    Eat what you can tolerate Stay hydrated   Let us know if you need anything  Advil as needed (with food) for pain  Salt water gargles are great for sore throat

## 2023-03-14 NOTE — Assessment & Plan Note (Signed)
Now from cancer treatment  Reviewed labs from [facility]

## 2023-03-14 NOTE — Assessment & Plan Note (Signed)
In process of treatment with chemo  Has a port Tolerating fairly so far  Weight is down -waching  Reviewed last onc notes and labs from [facility] incl cbc and cmet

## 2023-03-14 NOTE — Progress Notes (Signed)
Subjective:    Patient ID: Nicole Johnson, female    DOB: 02/19/1961, 62 y.o.   MRN: [id number]  HPI  Here for health maintenance exam and to review chronic medical problems   Wt Readings from Last 3 Encounters:  03/14/23 158 lb (71.7 kg)  01/14/23 180 lb 14.4 oz (82.1 kg)  12/17/22 187 lb (84.8 kg)   26.50 kg/m  Vitals:   03/14/23 0833  BP: 108/70  Pulse: 94  Temp: 97.8 F (36.6 C)  SpO2: 99%    Immunization History  Administered Date(s) Administered   Influenza,inj,Quad PF,6+ Mos 03/20/2017, 04/03/2020, 02/10/2021, 03/10/2022   Influenza-Unspecified 02/22/2015, 03/20/2017, 02/06/2023   PFIZER(Purple Top)SARS-COV-2 Vaccination 08/07/2019, 08/29/2019, 04/06/2020   Pfizer Covid-19 Vaccine Bivalent Booster 33yrs & up 02/07/2021   Pfizer(Comirnaty)Fall Seasonal Vaccine 12 years and older 02/06/2023   Pneumococcal Polysaccharide-23 08/12/2014   Tdap 12/08/2018   Zoster Recombinant(Shingrix) 01/20/2019, 04/05/2019    Health Maintenance Due  Topic Date Due   HIV Screening  Never done   Hepatitis C Screening  Never done   Cervical Cancer Screening (HPV/Pap Cotest)  08/03/2020   Was dx with pancreatic cancer  In chemo (had to skip last dose due to low hb) This is rough  Cold sensitive  Some back pain    Flu shot and covid shot - utd    Mammogram 12/2022  Self breast exam- no lumps  Sister had breast cancer   Gyn health Pap 07/2017  normal with neg HPV    Colon cancer screening - 10/2021 colonoscopy  Bone health  Dexa - not yet  Falls- none  Fractures-none  Supplements - ca with D  Exercise : limited with her current cancer treatment  Was walking     Mood    09/09/2022    8:05 AM 03/10/2022    3:40 PM 02/10/2021    2:49 PM 12/26/2019    4:25 PM 12/08/2018    2:33 PM  Depression screen PHQ 2/9  Decreased Interest 0 0 0 0 0  Down, Depressed, Hopeless 0 0 0 0 0  PHQ - 2 Score 0 0 0 0 0  Altered sleeping 0 0 0    Tired, decreased energy 0 1 0     Change in appetite 0 1 0    Feeling bad or failure about yourself  0 0 0    Trouble concentrating 0 0 0    Moving slowly or fidgety/restless 0 0 0    Suicidal thoughts 0 0 0    PHQ-9 Score 0 2 0    Difficult doing work/chores Not difficult at all  Not difficult at all     GERD Omeprazole 20 mg bid   Lab Results  Component Value Date   VITAMINB12 748 03/03/2022   Prediabetes Lab Results  Component Value Date   HGBA1C 6.5 12/07/2022   Due for labs   Lipids Lab Results  Component Value Date   CHOL 156 03/03/2022   HDL 52.60 03/03/2022   LDLCALC 86 03/03/2022   TRIG 85.0 03/03/2022   CHOLHDL 3 03/03/2022     Lab Results  Component Value Date   WBC 3.3 (L) 01/14/2023   HGB 11.0 (L) 01/14/2023   HCT 34.3 (L) 01/14/2023   MCV 85.8 01/14/2023   PLT 103 (L) 01/14/2023   From specialist on 10/18 Hb 10.3 Wbc 4.1  Lab Results  Component Value Date   IRON 49 03/03/2022   FERRITIN 29.3 03/03/2022   Sees GI  Specialist labs  Na 134 K 5.0 Bun 14 Ca 10.2 AST 43 ALT 58 Albu 3.6 GFR 98  Getting 60 oz water per day  Son has a cold  She had a scratchy throat    Patient Active Problem List   Diagnosis Date Noted   Genetic testing 01/28/2023   Pancreatic cancer (HCC) 01/06/2023   Jaundice 12/16/2022   Allergic rhinitis 09/09/2022   Mild anemia 02/10/2021   Routine general medical examination at a health care facility 01/01/2021   Muscle cramps 07/10/2020   Current use of proton pump inhibitor 07/10/2020   B12 deficiency 07/10/2020   Prediabetes 12/26/2019   Gastroesophageal reflux disease 12/02/2017   Stress incontinence of urine 07/26/2016   Past Medical History:  Diagnosis Date   Arthritis    GERD (gastroesophageal reflux disease)    PONV (postoperative nausea and vomiting)    Prediabetes    Past Surgical History:  Procedure Laterality Date   BILIARY BRUSHING  12/17/2022   Procedure: BILIARY BRUSHING;  Surgeon: Jeani Hawking, MD;  Location: [facility];  Service: Gastroenterology;;   BILIARY STENT PLACEMENT N/A 12/17/2022   Procedure: BILIARY STENT PLACEMENT;  Surgeon: Jeani Hawking, MD;  Location: [facility] ENDOSCOPY;  Service: Gastroenterology;  Laterality: N/A;   ERCP N/A 12/17/2022   Procedure: ENDOSCOPIC RETROGRADE CHOLANGIOPANCREATOGRAPHY (ERCP);  Surgeon: Jeani Hawking, MD;  Location: [facility];  Service: Gastroenterology;  Laterality: N/A;   ESOPHAGOGASTRODUODENOSCOPY (EGD) WITH PROPOFOL N/A 12/18/2022   Procedure: ESOPHAGOGASTRODUODENOSCOPY (EGD) WITH PROPOFOL;  Surgeon: Jeani Hawking, MD;  Location: [facility] ENDOSCOPY;  Service: Gastroenterology;  Laterality: N/A;   ESOPHAGOGASTRODUODENOSCOPY (EGD) WITH PROPOFOL N/A 12/30/2022   Procedure: ESOPHAGOGASTRODUODENOSCOPY (EGD) WITH PROPOFOL;  Surgeon: Jeani Hawking, MD;  Location: [facility] ENDOSCOPY;  Service: Gastroenterology;  Laterality: N/A;   EUS N/A 12/18/2022   Procedure: UPPER ENDOSCOPIC ULTRASOUND (EUS) LINEAR;  Surgeon: Jeani Hawking, MD;  Location: [facility] ENDOSCOPY;  Service: Gastroenterology;  Laterality: N/A;   FINE NEEDLE ASPIRATION N/A 12/18/2022   Procedure: FINE NEEDLE ASPIRATION (FNA) LINEAR;  Surgeon: Jeani Hawking, MD;  Location: [facility] ENDOSCOPY;  Service: Gastroenterology;  Laterality: N/A;   FINE NEEDLE ASPIRATION  12/30/2022   Procedure: FINE NEEDLE ASPIRATION (FNA) LINEAR;  Surgeon: Jeani Hawking, MD;  Location: [facility] ENDOSCOPY;  Service: Gastroenterology;;   LAPAROSCOPIC ABDOMINAL EXPLORATION     SPHINCTEROTOMY  12/17/2022   Procedure: Dennison Mascot;  Surgeon: Jeani Hawking, MD;  Location: [facility] ENDOSCOPY;  Service: Gastroenterology;;   UPPER ESOPHAGEAL ENDOSCOPIC ULTRASOUND (EUS) N/A 12/30/2022   Procedure: UPPER ESOPHAGEAL ENDOSCOPIC ULTRASOUND (EUS);  Surgeon: Jeani Hawking, MD;  Location: [facility] ENDOSCOPY;  Service: Gastroenterology;  Laterality: N/A;   Social History   Tobacco Use   Smoking status: Never   Smokeless tobacco: Never  Substance Use Topics   Alcohol use: Yes    Comment:  occ   Drug use: Never   Family History  Problem Relation Age of Onset   Diabetes Mother    Stroke Mother    Lung cancer Father 18       smoked   Diabetes Father    Diabetes Sister    Breast cancer Sister 52   Diabetes Brother    Cancer Brother 60       neck cancer   Heart disease Maternal Grandmother    Breast cancer Maternal Grandmother        dx. <50, double mastectomy   Heart disease Maternal Grandfather    Allergies  Allergen Reactions   Other Nausea Only and Other (See Comments)  Certain types of Anesthesia cause SEVERE NAUSEA   Codeine Nausea Only   Meloxicam Nausea Only   Tramadol Nausea Only   Current Outpatient Medications on File Prior to Visit  Medication Sig Dispense Refill   ADVIL 200 MG CAPS Take 400-600 mg by mouth every 6 (six) hours as needed (for pain).     Calcium Carb-Cholecalciferol (CALTRATE 600+D3 PO) Take 1 tablet by mouth daily with breakfast.     CREON 24000-76000 units CPEP Take 3 capsules by mouth before each meal, and take 1 capsule before snacks.     Ferrous Sulfate (IRON) 325 (65 Fe) MG TABS Take 325 mg by mouth daily with breakfast.     fluticasone (FLONASE) 50 MCG/ACT nasal spray Place 2 sprays into both nostrils daily. 16 g 6   lidocaine-prilocaine (EMLA) cream Apply to affected area once 30 g 3   MAGNESIUM PO Take 1 tablet by mouth daily.     multivitamin-lutein (OCUVITE-LUTEIN) CAPS capsule Take 1 capsule by mouth daily.     NON FORMULARY Apply 1 application  topically See admin instructions. Hot Cream  Muscle & Joint Cream/Capsaicin- based- Apply to painful or itchy areas as directed/as needed for relief     omeprazole (PRILOSEC) 20 MG capsule TAKE 1 CAPSULE(20 MG) BY MOUTH DAILY (Patient taking differently: Take 20 mg by mouth 2 (two) times daily before a meal.) 90 capsule 1   ondansetron (ZOFRAN) 8 MG tablet Take 1 tablet (8 mg total) by mouth every 8 (eight) hours as needed for nausea or vomiting. Start on the third day after  irinotecan 30 tablet 1   prochlorperazine (COMPAZINE) 10 MG tablet Take 1 tablet (10 mg total) by mouth every 6 (six) hours as needed for nausea or vomiting. 30 tablet 1   Pyridoxine HCl (VITAMIN B6 PO) Take 1 tablet by mouth daily.     triamcinolone cream (KENALOG) 0.1 % Apply 1 Application topically 2 (two) times daily as needed. 45 g 1   TURMERIC PO Take 2 capsules by mouth daily.     vitamin B-12 (CYANOCOBALAMIN) 500 MCG tablet Take 500 mcg by mouth daily.     No current facility-administered medications on file prior to visit.    Review of Systems  Constitutional:  Positive for activity change, appetite change and fatigue. Negative for fever and unexpected weight change.  HENT:  Negative for congestion, ear pain, rhinorrhea, sinus pressure and sore throat.   Eyes:  Negative for pain, redness and visual disturbance.  Respiratory:  Negative for cough, shortness of breath and wheezing.   Cardiovascular:  Negative for chest pain and palpitations.  Gastrointestinal:  Negative for abdominal pain, blood in stool, constipation and diarrhea.       Bloating  No pain today  Some food intolerances   Endocrine: Negative for polydipsia and polyuria.  Genitourinary:  Negative for dysuria, frequency and urgency.  Musculoskeletal:  Negative for arthralgias, back pain and myalgias.  Skin:  Negative for pallor and rash.  Allergic/Immunologic: Negative for environmental allergies.  Neurological:  Negative for dizziness, syncope and headaches.  Hematological:  Negative for adenopathy. Does not bruise/bleed easily.  Psychiatric/Behavioral:  Negative for decreased concentration and dysphoric mood. The patient is not nervous/anxious.        Objective:   Physical Exam Constitutional:      General: She is not in acute distress.    Appearance: Normal appearance. She is well-developed and normal weight. She is not ill-appearing or diaphoretic.  HENT:     Head:  Normocephalic and atraumatic.     Right  Ear: Tympanic membrane, ear canal and external ear normal.     Left Ear: Tympanic membrane, ear canal and external ear normal.     Nose: Nose normal. No congestion.     Mouth/Throat:     Mouth: Mucous membranes are moist.     Pharynx: Oropharynx is clear. No posterior oropharyngeal erythema.  Eyes:     General: No scleral icterus.    Extraocular Movements: Extraocular movements intact.     Conjunctiva/sclera: Conjunctivae normal.     Pupils: Pupils are equal, round, and reactive to light.  Neck:     Thyroid: No thyromegaly.     Vascular: No carotid bruit or JVD.  Cardiovascular:     Rate and Rhythm: Normal rate and regular rhythm.     Pulses: Normal pulses.     Heart sounds: Normal heart sounds.     No gallop.  Pulmonary:     Effort: Pulmonary effort is normal. No respiratory distress.     Breath sounds: Normal breath sounds. No wheezing.     Comments: Good air exch Chest:     Chest wall: No tenderness.  Abdominal:     General: Bowel sounds are normal. There is no distension or abdominal bruit.     Palpations: Abdomen is soft. There is no mass.     Tenderness: There is no abdominal tenderness.     Hernia: No hernia is present.  Genitourinary:    Comments: Breast exam: No mass, nodules, thickening, tenderness, bulging, retraction, inflamation, nipple discharge or skin changes noted.  No axillary or clavicular LA.     Musculoskeletal:        General: No tenderness. Normal range of motion.     Cervical back: Normal range of motion and neck supple. No rigidity. No muscular tenderness.     Right lower leg: No edema.     Left lower leg: No edema.     Comments: No kyphosis   Lymphadenopathy:     Cervical: No cervical adenopathy.  Skin:    General: Skin is warm and dry.     Coloration: Skin is not pale.     Findings: No erythema or rash.     Comments: Sallow /not jaundiced  Solar lentigines diffusely   Neurological:     Mental Status: She is alert. Mental status is at  baseline.     Cranial Nerves: No cranial nerve deficit.     Motor: No abnormal muscle tone.     Coordination: Coordination normal.     Gait: Gait normal.     Deep Tendon Reflexes: Reflexes are normal and symmetric. Reflexes normal.  Psychiatric:        Mood and Affect: Mood normal.        Cognition and Memory: Cognition and memory normal.           Assessment & Plan:   Problem List Items Addressed This Visit       Digestive   Gastroesophageal reflux disease    Controlled well with omeprazole 20 mg bid  Has tried to stop but had much rebound symptoms B12 and magnesium levels added to labs in light of ppi      Relevant Medications   CREON 24000-76000 units CPEP   Pancreatic cancer (HCC)    In process of treatment with chemo  Has a port Tolerating fairly so far  Weight is down -waching  Reviewed last onc notes and labs from [facility] incl  cbc and cmet         Other   B12 deficiency    B12 today   Oral supplementation On ppi       Relevant Orders   Vitamin B12   Current use of proton pump inhibitor    B12 and D levels added to labs  Last chem labs reviewed from [facility]       Relevant Orders   VITAMIN D 25 Hydroxy (Vit-D Deficiency, Fractures)   Vitamin B12   Mild anemia    Now from cancer treatment  Reviewed labs from [facility]       Prediabetes    A1c ordered  In setting of pancreatic cancer treatment  disc imp of low glycemic diet and wt loss to prevent DM2        Relevant Orders   Hemoglobin A1c   Routine general medical examination at a health care facility - Primary    Reviewed health habits including diet and exercise and skin cancer prevention Reviewed appropriate screening tests for age  Also reviewed health mt list, fam hx and immunization status , as well as social and family history   See HPI Labs reviewed and ordered In cancer treatment currently  Considering RSV vaccine in pharmacy  Due for gyn visit for pap as well/she plans to  schedule Colonoscopy 10/2021  Discussed fall prevention, supplements and exercise for bone density  PHQ 0       Relevant Orders   Lipid panel   TSH

## 2023-03-14 NOTE — Assessment & Plan Note (Signed)
A1c ordered  In setting of pancreatic cancer treatment  disc imp of low glycemic diet and wt loss to prevent DM2

## 2023-03-18 DIAGNOSIS — C259 Malignant neoplasm of pancreas, unspecified: Secondary | ICD-10-CM | POA: Diagnosis not present

## 2023-03-18 DIAGNOSIS — C250 Malignant neoplasm of head of pancreas: Secondary | ICD-10-CM | POA: Diagnosis not present

## 2023-03-18 DIAGNOSIS — C258 Malignant neoplasm of overlapping sites of pancreas: Secondary | ICD-10-CM | POA: Diagnosis not present

## 2023-03-21 ENCOUNTER — Ambulatory Visit: Payer: BC Managed Care – PPO | Admitting: Internal Medicine

## 2023-03-23 ENCOUNTER — Other Ambulatory Visit: Payer: BC Managed Care – PPO

## 2023-03-23 ENCOUNTER — Ambulatory Visit: Payer: BC Managed Care – PPO | Admitting: Hematology

## 2023-03-23 ENCOUNTER — Ambulatory Visit: Payer: BC Managed Care – PPO

## 2023-03-25 DIAGNOSIS — C250 Malignant neoplasm of head of pancreas: Secondary | ICD-10-CM | POA: Diagnosis not present

## 2023-03-25 DIAGNOSIS — Z87891 Personal history of nicotine dependence: Secondary | ICD-10-CM | POA: Diagnosis not present

## 2023-03-28 ENCOUNTER — Telehealth: Payer: Self-pay | Admitting: Hematology

## 2023-03-28 ENCOUNTER — Other Ambulatory Visit: Payer: Self-pay

## 2023-03-28 ENCOUNTER — Telehealth: Payer: Self-pay

## 2023-03-28 DIAGNOSIS — C250 Malignant neoplasm of head of pancreas: Secondary | ICD-10-CM | POA: Diagnosis not present

## 2023-03-28 DIAGNOSIS — C259 Malignant neoplasm of pancreas, unspecified: Secondary | ICD-10-CM | POA: Diagnosis not present

## 2023-03-28 NOTE — Telephone Encounter (Signed)
Pt called stating that Dr. Johny Blamer at [facility] wants to refer the pt a Hematologist d/t low plt count.  Pt stated Dr. Johny Blamer referred pt to 2 hematologist in this area which refused to accept pt.  Dr. Johny Blamer stated the pt's chemo would be placed on hold until pt's plts are stabilized.  Pt will start radiation at [facility] in the interim and once plts are WNL Dr. Johny Blamer will restart pt's chemo.  Pt wants to know if Dr. Mosetta Putt could manage her hematological needs/care until the pt is stable enough to restart chemotherapy.  Informed pt that Dr. Latanya Maudlin scheduler will contact the pt to get her scheduled to see Dr. Mosetta Putt.

## 2023-03-29 ENCOUNTER — Other Ambulatory Visit: Payer: Self-pay

## 2023-03-30 ENCOUNTER — Other Ambulatory Visit: Payer: Self-pay

## 2023-03-30 DIAGNOSIS — Z8719 Personal history of other diseases of the digestive system: Secondary | ICD-10-CM | POA: Diagnosis not present

## 2023-03-30 DIAGNOSIS — Z888 Allergy status to other drugs, medicaments and biological substances status: Secondary | ICD-10-CM | POA: Diagnosis not present

## 2023-03-30 DIAGNOSIS — K831 Obstruction of bile duct: Secondary | ICD-10-CM | POA: Diagnosis not present

## 2023-03-30 DIAGNOSIS — Z884 Allergy status to anesthetic agent status: Secondary | ICD-10-CM | POA: Diagnosis not present

## 2023-03-30 DIAGNOSIS — Z885 Allergy status to narcotic agent status: Secondary | ICD-10-CM | POA: Diagnosis not present

## 2023-03-30 DIAGNOSIS — Z4689 Encounter for fitting and adjustment of other specified devices: Secondary | ICD-10-CM | POA: Diagnosis not present

## 2023-03-30 DIAGNOSIS — C259 Malignant neoplasm of pancreas, unspecified: Secondary | ICD-10-CM | POA: Diagnosis not present

## 2023-03-30 DIAGNOSIS — Z4659 Encounter for fitting and adjustment of other gastrointestinal appliance and device: Secondary | ICD-10-CM | POA: Diagnosis not present

## 2023-03-30 DIAGNOSIS — Z9689 Presence of other specified functional implants: Secondary | ICD-10-CM | POA: Diagnosis not present

## 2023-03-30 DIAGNOSIS — Z79899 Other long term (current) drug therapy: Secondary | ICD-10-CM | POA: Diagnosis not present

## 2023-03-30 DIAGNOSIS — K838 Other specified diseases of biliary tract: Secondary | ICD-10-CM | POA: Diagnosis not present

## 2023-03-30 DIAGNOSIS — T859XXA Unspecified complication of internal prosthetic device, implant and graft, initial encounter: Secondary | ICD-10-CM | POA: Diagnosis not present

## 2023-04-02 ENCOUNTER — Emergency Department (HOSPITAL_COMMUNITY): Payer: BC Managed Care – PPO

## 2023-04-02 ENCOUNTER — Encounter (HOSPITAL_COMMUNITY): Payer: Self-pay | Admitting: Emergency Medicine

## 2023-04-02 ENCOUNTER — Other Ambulatory Visit: Payer: Self-pay

## 2023-04-02 ENCOUNTER — Inpatient Hospital Stay (HOSPITAL_COMMUNITY)
Admission: EM | Admit: 2023-04-02 | Discharge: 2023-04-06 | DRG: 919 | Disposition: A | Discharge status: Home / Self Care | Payer: BC Managed Care – PPO | Attending: Internal Medicine | Admitting: Internal Medicine

## 2023-04-02 DIAGNOSIS — C259 Malignant neoplasm of pancreas, unspecified: Secondary | ICD-10-CM | POA: Diagnosis present

## 2023-04-02 DIAGNOSIS — K810 Acute cholecystitis: Secondary | ICD-10-CM | POA: Diagnosis not present

## 2023-04-02 DIAGNOSIS — Z884 Allergy status to anesthetic agent status: Secondary | ICD-10-CM

## 2023-04-02 DIAGNOSIS — Z79899 Other long term (current) drug therapy: Secondary | ICD-10-CM

## 2023-04-02 DIAGNOSIS — R7303 Prediabetes: Secondary | ICD-10-CM | POA: Diagnosis not present

## 2023-04-02 DIAGNOSIS — Z885 Allergy status to narcotic agent status: Secondary | ICD-10-CM | POA: Diagnosis not present

## 2023-04-02 DIAGNOSIS — Z823 Family history of stroke: Secondary | ICD-10-CM | POA: Diagnosis not present

## 2023-04-02 DIAGNOSIS — Z833 Family history of diabetes mellitus: Secondary | ICD-10-CM | POA: Diagnosis not present

## 2023-04-02 DIAGNOSIS — K8020 Calculus of gallbladder without cholecystitis without obstruction: Secondary | ICD-10-CM | POA: Diagnosis not present

## 2023-04-02 DIAGNOSIS — K219 Gastro-esophageal reflux disease without esophagitis: Secondary | ICD-10-CM | POA: Diagnosis present

## 2023-04-02 DIAGNOSIS — K8689 Other specified diseases of pancreas: Secondary | ICD-10-CM | POA: Diagnosis not present

## 2023-04-02 DIAGNOSIS — K8000 Calculus of gallbladder with acute cholecystitis without obstruction: Secondary | ICD-10-CM | POA: Diagnosis not present

## 2023-04-02 DIAGNOSIS — Z886 Allergy status to analgesic agent status: Secondary | ICD-10-CM | POA: Diagnosis not present

## 2023-04-02 DIAGNOSIS — R1011 Right upper quadrant pain: Secondary | ICD-10-CM | POA: Diagnosis not present

## 2023-04-02 DIAGNOSIS — Z808 Family history of malignant neoplasm of other organs or systems: Secondary | ICD-10-CM | POA: Diagnosis not present

## 2023-04-02 DIAGNOSIS — K831 Obstruction of bile duct: Secondary | ICD-10-CM | POA: Diagnosis present

## 2023-04-02 DIAGNOSIS — Z8249 Family history of ischemic heart disease and other diseases of the circulatory system: Secondary | ICD-10-CM

## 2023-04-02 DIAGNOSIS — Z803 Family history of malignant neoplasm of breast: Secondary | ICD-10-CM

## 2023-04-02 DIAGNOSIS — E871 Hypo-osmolality and hyponatremia: Secondary | ICD-10-CM | POA: Diagnosis not present

## 2023-04-02 DIAGNOSIS — K828 Other specified diseases of gallbladder: Secondary | ICD-10-CM | POA: Diagnosis not present

## 2023-04-02 DIAGNOSIS — Z801 Family history of malignant neoplasm of trachea, bronchus and lung: Secondary | ICD-10-CM | POA: Diagnosis not present

## 2023-04-02 DIAGNOSIS — R161 Splenomegaly, not elsewhere classified: Secondary | ICD-10-CM | POA: Diagnosis not present

## 2023-04-02 DIAGNOSIS — K769 Liver disease, unspecified: Secondary | ICD-10-CM | POA: Diagnosis not present

## 2023-04-02 DIAGNOSIS — E876 Hypokalemia: Secondary | ICD-10-CM | POA: Diagnosis present

## 2023-04-02 DIAGNOSIS — D696 Thrombocytopenia, unspecified: Secondary | ICD-10-CM | POA: Diagnosis not present

## 2023-04-02 DIAGNOSIS — C250 Malignant neoplasm of head of pancreas: Secondary | ICD-10-CM | POA: Diagnosis present

## 2023-04-02 DIAGNOSIS — D649 Anemia, unspecified: Secondary | ICD-10-CM | POA: Diagnosis not present

## 2023-04-02 DIAGNOSIS — Z4682 Encounter for fitting and adjustment of non-vascular catheter: Secondary | ICD-10-CM | POA: Diagnosis not present

## 2023-04-02 DIAGNOSIS — K819 Cholecystitis, unspecified: Principal | ICD-10-CM

## 2023-04-02 DIAGNOSIS — T85898A Other specified complication of other internal prosthetic devices, implants and grafts, initial encounter: Secondary | ICD-10-CM | POA: Diagnosis not present

## 2023-04-02 LAB — PROTIME-INR
INR: 1.4 — ABNORMAL HIGH (ref 0.8–1.2)
Prothrombin Time: 17.6 s — ABNORMAL HIGH (ref 11.4–15.2)

## 2023-04-02 LAB — URINALYSIS, W/ REFLEX TO CULTURE (INFECTION SUSPECTED)
Bacteria, UA: NONE SEEN
Bilirubin Urine: NEGATIVE
Glucose, UA: NEGATIVE mg/dL
Hgb urine dipstick: NEGATIVE
Ketones, ur: 20 mg/dL — AB
Nitrite: NEGATIVE
Protein, ur: 30 mg/dL — AB
Specific Gravity, Urine: 1.028 (ref 1.005–1.030)
pH: 5 (ref 5.0–8.0)

## 2023-04-02 LAB — CBC WITH DIFFERENTIAL/PLATELET
Abs Immature Granulocytes: 0.03 10*3/uL (ref 0.00–0.07)
Basophils Absolute: 0 10*3/uL (ref 0.0–0.1)
Basophils Relative: 0 %
Eosinophils Absolute: 0 10*3/uL (ref 0.0–0.5)
Eosinophils Relative: 0 %
HCT: 34.9 % — ABNORMAL LOW (ref 36.0–46.0)
Hemoglobin: 11.2 g/dL — ABNORMAL LOW (ref 12.0–15.0)
Immature Granulocytes: 0 %
Lymphocytes Relative: 4 %
Lymphs Abs: 0.3 10*3/uL — ABNORMAL LOW (ref 0.7–4.0)
MCH: 28.6 pg (ref 26.0–34.0)
MCHC: 32.1 g/dL (ref 30.0–36.0)
MCV: 89.3 fL (ref 80.0–100.0)
Monocytes Absolute: 0.4 10*3/uL (ref 0.1–1.0)
Monocytes Relative: 6 %
Neutro Abs: 6.2 10*3/uL (ref 1.7–7.7)
Neutrophils Relative %: 90 %
Platelets: 86 10*3/uL — ABNORMAL LOW (ref 150–400)
RBC: 3.91 MIL/uL (ref 3.87–5.11)
RDW: 14.6 % (ref 11.5–15.5)
WBC: 6.9 10*3/uL (ref 4.0–10.5)
nRBC: 0 % (ref 0.0–0.2)

## 2023-04-02 LAB — COMPREHENSIVE METABOLIC PANEL
ALT: 57 U/L — ABNORMAL HIGH (ref 0–44)
AST: 35 U/L (ref 15–41)
Albumin: 3.5 g/dL (ref 3.5–5.0)
Alkaline Phosphatase: 137 U/L — ABNORMAL HIGH (ref 38–126)
Anion gap: 10 (ref 5–15)
BUN: 16 mg/dL (ref 8–23)
CO2: 22 mmol/L (ref 22–32)
Calcium: 8.6 mg/dL — ABNORMAL LOW (ref 8.9–10.3)
Chloride: 100 mmol/L (ref 98–111)
Creatinine, Ser: 0.73 mg/dL (ref 0.44–1.00)
GFR, Estimated: >60 mL/min (ref >60)
Glucose, Bld: 105 mg/dL — ABNORMAL HIGH (ref 70–99)
Potassium: 3.9 mmol/L (ref 3.5–5.1)
Sodium: 132 mmol/L — ABNORMAL LOW (ref 135–145)
Total Bilirubin: 1.7 mg/dL — ABNORMAL HIGH (ref <1.2)
Total Protein: 6.8 g/dL (ref 6.5–8.1)

## 2023-04-02 LAB — LIPASE, BLOOD: Lipase: 22 U/L (ref 11–51)

## 2023-04-02 LAB — I-STAT CG4 LACTIC ACID, ED
Lactic Acid, Venous: 1.1 mmol/L (ref 0.5–1.9)
Lactic Acid, Venous: 0.8 mmol/L (ref 0.5–1.9)

## 2023-04-02 LAB — MAGNESIUM: Magnesium: 1.8 mg/dL (ref 1.7–2.4)

## 2023-04-02 LAB — APTT: aPTT: 37 s — ABNORMAL HIGH (ref 24–36)

## 2023-04-02 MED ORDER — FENTANYL CITRATE PF 50 MCG/ML IJ SOSY
50.0000 ug | PREFILLED_SYRINGE | INTRAMUSCULAR | Status: DC | PRN
  Administered 2023-04-02: 50 ug via INTRAVENOUS
  Filled 2023-04-02: qty 1

## 2023-04-02 MED ORDER — SODIUM CHLORIDE 0.9 % IV BOLUS
1000.0000 mL | Freq: Once | INTRAVENOUS | Status: AC
  Administered 2023-04-02: 1000 mL via INTRAVENOUS

## 2023-04-02 MED ORDER — MORPHINE SULFATE (PF) 4 MG/ML IV SOLN
4.0000 mg | Freq: Once | INTRAVENOUS | Status: AC
  Administered 2023-04-02: 4 mg via INTRAVENOUS
  Filled 2023-04-02: qty 1

## 2023-04-02 MED ORDER — PIPERACILLIN-TAZOBACTAM 3.375 G IVPB 30 MIN
3.3750 g | Freq: Once | INTRAVENOUS | Status: AC
  Administered 2023-04-02: 3.375 g via INTRAVENOUS
  Filled 2023-04-02: qty 50

## 2023-04-02 MED ORDER — IOHEXOL 300 MG/ML  SOLN
100.0000 mL | Freq: Once | INTRAMUSCULAR | Status: AC | PRN
  Administered 2023-04-02: 100 mL via INTRAVENOUS

## 2023-04-02 MED ORDER — ONDANSETRON HCL 4 MG/2ML IJ SOLN
4.0000 mg | Freq: Once | INTRAMUSCULAR | Status: AC
Start: 2023-04-02 — End: 2023-04-02
  Administered 2023-04-02: 4 mg via INTRAVENOUS
  Filled 2023-04-02: qty 2

## 2023-04-02 NOTE — ED Provider Notes (Signed)
[place] EMERGENCY DEPARTMENT AT [facility] Provider Note   CSN: [id number] Arrival date & time: 04/02/23  1517     History  Chief Complaint  Patient presents with   Post-op Problem    Nicole Johnson is a 62 y.o. female.  Pt is a 62 yo female with pmhx significant for pancreatic cancer, arthritis, GERD, and thrombocytopenia.  Pt went to [facility] for a second opinion for her pancreatic cancer.  She had her plastic stent changed out for a metal one on 11/6.  She said she's had severe pain with n/v since then.  She has missed a few chemo appts as it was held due to thrombocytopenia.       Home Medications Prior to Admission medications   Medication Sig Start Date End Date Taking? Authorizing Provider  ADVIL 200 MG CAPS Take 400-600 mg by mouth every 6 (six) hours as needed (for pain).    [provider]  Calcium Carb-Cholecalciferol (CALTRATE 600+D3 PO) Take 1 tablet by mouth daily with breakfast.    [provider]  CREON 24000-76000 units CPEP Take 3 capsules by mouth before each meal, and take 1 capsule before snacks. 02/25/23   [provider]  Ferrous Sulfate (IRON) 325 (65 Fe) MG TABS Take 325 mg by mouth daily with breakfast.    [provider]  fluticasone (FLONASE) 50 MCG/ACT nasal spray Place 2 sprays into both nostrils daily. 09/09/22   Tower, Audrie Gallus, MD  MAGNESIUM PO Take 1 tablet by mouth daily.    [provider]  multivitamin-lutein (OCUVITE-LUTEIN) CAPS capsule Take 1 capsule by mouth daily.    [provider]  NON FORMULARY Apply 1 application  topically See admin instructions. Hot Cream  Muscle & Joint Cream/Capsaicin- based- Apply to painful or itchy areas as directed/as needed for relief    [provider]  omeprazole (PRILOSEC) 20 MG capsule TAKE 1 CAPSULE(20 MG) BY MOUTH DAILY Patient taking differently: Take 20 mg by mouth 2 (two) times daily before a meal. 11/22/19   Emi Belfast, FNP   Pyridoxine HCl (VITAMIN B6 PO) Take 1 tablet by mouth daily.    [provider]  triamcinolone cream (KENALOG) 0.1 % Apply 1 Application topically 2 (two) times daily as needed. 12/07/22   Karie Schwalbe, MD  TURMERIC PO Take 2 capsules by mouth daily.    [provider]  vitamin B-12 (CYANOCOBALAMIN) 500 MCG tablet Take 500 mcg by mouth daily.    [provider]      Allergies    Other, Codeine, Meloxicam, and Tramadol    Review of Systems   Review of Systems  Gastrointestinal:  Positive for abdominal pain, nausea and vomiting.  All other systems reviewed and are negative.   Physical Exam Updated Vital Signs BP 121/63 (BP Location: Left Arm)   Pulse 100   Temp 99.4 F (37.4 C) (Oral)   Resp 18   Ht 5\' 5"  (1.651 m)   Wt 72.6 kg   LMP 01/11/2011   SpO2 97%   BMI 26.63 kg/m  Physical Exam Vitals and nursing note reviewed.  Constitutional:      Appearance: Normal appearance.  HENT:     Head: Normocephalic and atraumatic.     Right Ear: External ear normal.     Left Ear: External ear normal.     Nose: Nose normal.     Mouth/Throat:     Mouth: Mucous membranes are dry.  Eyes:  Extraocular Movements: Extraocular movements intact.     Conjunctiva/sclera: Conjunctivae normal.     Pupils: Pupils are equal, round, and reactive to light.  Cardiovascular:     Rate and Rhythm: Regular rhythm. Tachycardia present.     Pulses: Normal pulses.     Heart sounds: Normal heart sounds.  Pulmonary:     Effort: Pulmonary effort is normal.     Breath sounds: Normal breath sounds.  Abdominal:     General: Abdomen is flat.     Tenderness: There is abdominal tenderness in the right upper quadrant and epigastric area.  Musculoskeletal:        General: Normal range of motion.     Cervical back: Normal range of motion and neck supple.  Skin:    General: Skin is warm.     Capillary Refill: Capillary refill takes less than 2 seconds.  Neurological:      General: No focal deficit present.     Mental Status: She is alert and oriented to person, place, and time.  Psychiatric:        Mood and Affect: Mood normal.        Behavior: Behavior normal.        Thought Content: Thought content normal.        Judgment: Judgment normal.     ED Results / Procedures / Treatments   Labs (all labs ordered are listed, but only abnormal results are displayed) Labs Reviewed  COMPREHENSIVE METABOLIC PANEL - Abnormal; Notable for the following components:      Result Value   Sodium 132 (*)    Glucose, Bld 105 (*)    Calcium 8.6 (*)    ALT 57 (*)    Alkaline Phosphatase 137 (*)    Total Bilirubin 1.7 (*)    All other components within normal limits  CBC WITH DIFFERENTIAL/PLATELET - Abnormal; Notable for the following components:   Hemoglobin 11.2 (*)    HCT 34.9 (*)    Platelets 86 (*)    Lymphs Abs 0.3 (*)    All other components within normal limits  PROTIME-INR - Abnormal; Notable for the following components:   Prothrombin Time 17.6 (*)    INR 1.4 (*)    All other components within normal limits  APTT - Abnormal; Notable for the following components:   aPTT 37 (*)    All other components within normal limits  URINALYSIS, W/ REFLEX TO CULTURE (INFECTION SUSPECTED) - Abnormal; Notable for the following components:   Color, Urine AMBER (*)    APPearance HAZY (*)    Ketones, ur 20 (*)    Protein, ur 30 (*)    Leukocytes,Ua TRACE (*)    All other components within normal limits  CULTURE, BLOOD (ROUTINE X 2)  CULTURE, BLOOD (ROUTINE X 2)  LIPASE, BLOOD  MAGNESIUM  I-STAT CG4 LACTIC ACID, ED  I-STAT CG4 LACTIC ACID, ED    EKG None  Radiology No results found.  Procedures Procedures    Medications Ordered in ED Medications  iohexol (OMNIPAQUE) 300 MG/ML solution 100 mL (100 mLs Intravenous Contrast Given 04/02/23 2214)  morphine (PF) 4 MG/ML injection 4 mg (4 mg Intravenous Given 04/02/23 2200)  sodium chloride 0.9 % bolus 1,000  mL (1,000 mLs Intravenous New Bag/Given 04/02/23 2205)  ondansetron (ZOFRAN) injection 4 mg (4 mg Intravenous Given 04/02/23 2201)    ED Course/ Medical Decision Making/ A&P  Medical Decision Making Risk Prescription drug management.   This patient presents to the ED for concern of abd pain, this involves an extensive number of treatment options, and is a complaint that carries with it a high risk of complications and morbidity.  The differential diagnosis includes pancreatitis, perforation, hematoma   Co morbidities that complicate the patient evaluation  pancreatic cancer, arthritis, GERD, and thrombocytopenia   Additional history obtained:  Additional history obtained from epic chart review External records from outside source obtained and reviewed including husband   Lab Tests:  I Ordered, and personally interpreted labs.  The pertinent results include:  cbc with hgb 11.2 (11.0 in August) and plt low at 86 (103 in August); lactic nl; ua + ketones, protein, cmp nl other than lat 57, ap 137 and TB 1.7 (alt 29, ap 99 and tb 1.1 in August); lip nl   Imaging Studies ordered:  I ordered imaging studies including ct abd/pelvis  Pending at shift change   Cardiac Monitoring:  The patient was maintained on a cardiac monitor.  I personally viewed and interpreted the cardiac monitored which showed an underlying rhythm of: st   Medicines ordered and prescription drug management:  I ordered medication including ivfs/zofran/morphine  for sx  Reevaluation of the patient after these medicines showed that the patient improved I have reviewed the patients home medicines and have made adjustments as needed   Test Considered:  ct   Problem List / ED Course:  Abd pain s/p stent exchange:  ct pending   Reevaluation:  After the interventions noted above, I reevaluated the patient and found that they have :improved   Social Determinants of  Health:  Lives at home   Dispostion:  Pending at shift change        Final Clinical Impression(s) / ED Diagnoses Final diagnoses:  None    Rx / DC Orders ED Discharge Orders     None         Jacalyn Lefevre, MD 04/02/23 2257

## 2023-04-02 NOTE — ED Triage Notes (Signed)
Patient arrives ambulatory by POV c/o fatigue, headaches, chills and right upper abdominal pain since having procedure done Wednesday.

## 2023-04-02 NOTE — ED Provider Triage Note (Signed)
Emergency Medicine Provider Triage Evaluation Note  ANNAELISE RHEM , a 62 y.o. female  was evaluated in triage.  Pt complains of worsening RUQ abdominal pain following stent change out. 99.1 oral temp during my eval. Chills earlier today.   Review of Systems  Positive: As above Negative: As above  Physical Exam  BP 127/79 (BP Location: Right Arm)   Pulse (!) 113   Temp 97.8 F (36.6 C) (Oral)   Resp 18   Ht 5\' 5"  (1.651 m)   Wt 72.6 kg   LMP 01/11/2011   SpO2 98%   BMI 26.63 kg/m  Gen:   Awake, no distress   Resp:  Normal effort  MSK:   Moves extremities without difficulty  Other:    Medical Decision Making  Medically screening exam initiated at 3:37 PM.  Appropriate orders placed.  SHIVIKA ENGELHARD was informed that the remainder of the evaluation will be completed by another provider, this initial triage assessment does not replace that evaluation, and the importance of remaining in the ED until their evaluation is complete.     Marita Kansas, PA-C 04/02/23 1538

## 2023-04-02 NOTE — ED Provider Notes (Signed)
Care of patient assumed from Dr. Particia Nearing.  This patient has pancreatic cancer and underwent ERCP with stent exchange 3 days ago.  She has since had abdominal pain.  She is awaiting results of CT scan. Physical Exam  BP 121/63 (BP Location: Left Arm)   Pulse 100   Temp 99.4 F (37.4 C) (Oral)   Resp 18   Ht 5\' 5"  (1.651 m)   Wt 72.6 kg   LMP 01/11/2011   SpO2 97%   BMI 26.63 kg/m   Physical Exam Vitals and nursing note reviewed.  Constitutional:      General: She is not in acute distress.    Appearance: Normal appearance. She is well-developed. She is not ill-appearing, toxic-appearing or diaphoretic.  HENT:     Head: Normocephalic and atraumatic.     Right Ear: External ear normal.     Left Ear: External ear normal.     Nose: Nose normal.     Mouth/Throat:     Mouth: Mucous membranes are moist.  Eyes:     Extraocular Movements: Extraocular movements intact.     Conjunctiva/sclera: Conjunctivae normal.  Cardiovascular:     Rate and Rhythm: Normal rate and regular rhythm.  Pulmonary:     Effort: Pulmonary effort is normal. No respiratory distress.  Abdominal:     Palpations: Abdomen is soft.     Tenderness: There is abdominal tenderness. There is no guarding or rebound.  Musculoskeletal:        General: No swelling. Normal range of motion.     Cervical back: Normal range of motion and neck supple.  Skin:    General: Skin is warm and dry.     Coloration: Skin is not jaundiced or pale.  Neurological:     General: No focal deficit present.     Mental Status: She is alert and oriented to person, place, and time.  Psychiatric:        Mood and Affect: Mood normal.        Behavior: Behavior normal.     Procedures  Procedures  ED Course / MDM    Medical Decision Making Risk Prescription drug management.   On assessment, patient resting comfortably.  She endorses some mild ongoing pain in right upper quadrant.  She reports that she had generalized pain following her  stent exchange 3 days ago.  Pain has since localized to right upper quadrant.  She does have tenderness to this area.  Current heart rate and blood pressure are normal.  CT scan shows concern for cholecystitis.  Antibiotics were ordered.  Patient reports that her chemotherapy has been paused for the past month due to low platelets.  She has not yet started radiation.  I spoke with general surgeon on-call, Dr. Magnus Ivan, who will see the patient in consult. ***

## 2023-04-03 DIAGNOSIS — Z8249 Family history of ischemic heart disease and other diseases of the circulatory system: Secondary | ICD-10-CM | POA: Diagnosis not present

## 2023-04-03 DIAGNOSIS — Z885 Allergy status to narcotic agent status: Secondary | ICD-10-CM | POA: Diagnosis not present

## 2023-04-03 DIAGNOSIS — D696 Thrombocytopenia, unspecified: Secondary | ICD-10-CM | POA: Diagnosis present

## 2023-04-03 DIAGNOSIS — Z79899 Other long term (current) drug therapy: Secondary | ICD-10-CM | POA: Diagnosis not present

## 2023-04-03 DIAGNOSIS — K219 Gastro-esophageal reflux disease without esophagitis: Secondary | ICD-10-CM | POA: Diagnosis present

## 2023-04-03 DIAGNOSIS — Z884 Allergy status to anesthetic agent status: Secondary | ICD-10-CM | POA: Diagnosis not present

## 2023-04-03 DIAGNOSIS — Z4682 Encounter for fitting and adjustment of non-vascular catheter: Secondary | ICD-10-CM | POA: Diagnosis not present

## 2023-04-03 DIAGNOSIS — D649 Anemia, unspecified: Secondary | ICD-10-CM | POA: Diagnosis not present

## 2023-04-03 DIAGNOSIS — K831 Obstruction of bile duct: Secondary | ICD-10-CM | POA: Diagnosis not present

## 2023-04-03 DIAGNOSIS — C250 Malignant neoplasm of head of pancreas: Secondary | ICD-10-CM | POA: Diagnosis not present

## 2023-04-03 DIAGNOSIS — E871 Hypo-osmolality and hyponatremia: Secondary | ICD-10-CM | POA: Diagnosis present

## 2023-04-03 DIAGNOSIS — K810 Acute cholecystitis: Secondary | ICD-10-CM | POA: Diagnosis present

## 2023-04-03 DIAGNOSIS — Z801 Family history of malignant neoplasm of trachea, bronchus and lung: Secondary | ICD-10-CM | POA: Diagnosis not present

## 2023-04-03 DIAGNOSIS — T85898A Other specified complication of other internal prosthetic devices, implants and grafts, initial encounter: Secondary | ICD-10-CM | POA: Diagnosis present

## 2023-04-03 DIAGNOSIS — R7303 Prediabetes: Secondary | ICD-10-CM | POA: Diagnosis present

## 2023-04-03 DIAGNOSIS — K828 Other specified diseases of gallbladder: Secondary | ICD-10-CM | POA: Diagnosis not present

## 2023-04-03 DIAGNOSIS — Z803 Family history of malignant neoplasm of breast: Secondary | ICD-10-CM | POA: Diagnosis not present

## 2023-04-03 DIAGNOSIS — R1011 Right upper quadrant pain: Secondary | ICD-10-CM | POA: Diagnosis not present

## 2023-04-03 DIAGNOSIS — K8689 Other specified diseases of pancreas: Secondary | ICD-10-CM | POA: Diagnosis not present

## 2023-04-03 DIAGNOSIS — C259 Malignant neoplasm of pancreas, unspecified: Secondary | ICD-10-CM | POA: Diagnosis not present

## 2023-04-03 DIAGNOSIS — Z808 Family history of malignant neoplasm of other organs or systems: Secondary | ICD-10-CM | POA: Diagnosis not present

## 2023-04-03 DIAGNOSIS — K8000 Calculus of gallbladder with acute cholecystitis without obstruction: Secondary | ICD-10-CM | POA: Diagnosis present

## 2023-04-03 DIAGNOSIS — Z823 Family history of stroke: Secondary | ICD-10-CM | POA: Diagnosis not present

## 2023-04-03 DIAGNOSIS — Z833 Family history of diabetes mellitus: Secondary | ICD-10-CM | POA: Diagnosis not present

## 2023-04-03 DIAGNOSIS — E876 Hypokalemia: Secondary | ICD-10-CM | POA: Diagnosis present

## 2023-04-03 DIAGNOSIS — Z886 Allergy status to analgesic agent status: Secondary | ICD-10-CM | POA: Diagnosis not present

## 2023-04-03 DIAGNOSIS — K769 Liver disease, unspecified: Secondary | ICD-10-CM | POA: Diagnosis not present

## 2023-04-03 LAB — COMPREHENSIVE METABOLIC PANEL
ALT: 46 U/L — ABNORMAL HIGH (ref 0–44)
AST: 30 U/L (ref 15–41)
Albumin: 3 g/dL — ABNORMAL LOW (ref 3.5–5.0)
Alkaline Phosphatase: 116 U/L (ref 38–126)
Anion gap: 11 (ref 5–15)
BUN: 21 mg/dL (ref 8–23)
CO2: 20 mmol/L — ABNORMAL LOW (ref 22–32)
Calcium: 8.3 mg/dL — ABNORMAL LOW (ref 8.9–10.3)
Chloride: 100 mmol/L (ref 98–111)
Creatinine, Ser: 0.7 mg/dL (ref 0.44–1.00)
GFR, Estimated: >60 mL/min (ref >60)
Glucose, Bld: 108 mg/dL — ABNORMAL HIGH (ref 70–99)
Potassium: 3.1 mmol/L — ABNORMAL LOW (ref 3.5–5.1)
Sodium: 131 mmol/L — ABNORMAL LOW (ref 135–145)
Total Bilirubin: 1.5 mg/dL — ABNORMAL HIGH (ref <1.2)
Total Protein: 6 g/dL — ABNORMAL LOW (ref 6.5–8.1)

## 2023-04-03 LAB — PROTIME-INR
INR: 1.7 — ABNORMAL HIGH (ref 0.8–1.2)
Prothrombin Time: 19.8 s — ABNORMAL HIGH (ref 11.4–15.2)

## 2023-04-03 LAB — CBC
HCT: 29 % — ABNORMAL LOW (ref 36.0–46.0)
Hemoglobin: 9.2 g/dL — ABNORMAL LOW (ref 12.0–15.0)
MCH: 28.5 pg (ref 26.0–34.0)
MCHC: 31.7 g/dL (ref 30.0–36.0)
MCV: 89.8 fL (ref 80.0–100.0)
Platelets: 75 10*3/uL — ABNORMAL LOW (ref 150–400)
RBC: 3.23 MIL/uL — ABNORMAL LOW (ref 3.87–5.11)
RDW: 14.9 % (ref 11.5–15.5)
WBC: 6.8 10*3/uL (ref 4.0–10.5)
nRBC: 0 % (ref 0.0–0.2)

## 2023-04-03 LAB — HIV ANTIBODY (ROUTINE TESTING W REFLEX): HIV Screen 4th Generation wRfx: NONREACTIVE

## 2023-04-03 MED ORDER — POTASSIUM CHLORIDE CRYS ER 20 MEQ PO TBCR
40.0000 meq | EXTENDED_RELEASE_TABLET | Freq: Two times a day (BID) | ORAL | Status: AC
  Administered 2023-04-03 (×2): 40 meq via ORAL
  Filled 2023-04-03 (×2): qty 2

## 2023-04-03 MED ORDER — PIPERACILLIN-TAZOBACTAM 3.375 G IVPB
3.3750 g | Freq: Three times a day (TID) | INTRAVENOUS | Status: DC
  Administered 2023-04-03 – 2023-04-06 (×10): 3.375 g via INTRAVENOUS
  Filled 2023-04-03 (×10): qty 50

## 2023-04-03 MED ORDER — ORAL CARE MOUTH RINSE: 15.0000 mL | OROMUCOSAL | Status: DC | PRN

## 2023-04-03 MED ORDER — ACETAMINOPHEN 650 MG RE SUPP: 650.0000 mg | Freq: Four times a day (QID) | RECTAL | Status: DC | PRN

## 2023-04-03 MED ORDER — ACETAMINOPHEN 325 MG PO TABS
650.0000 mg | ORAL_TABLET | Freq: Four times a day (QID) | ORAL | Status: DC | PRN
Start: 2023-04-03 — End: 2023-04-06
  Administered 2023-04-05: 650 mg via ORAL
  Filled 2023-04-03: qty 2

## 2023-04-03 MED ORDER — LACTATED RINGERS IV SOLN: INTRAVENOUS | Status: DC

## 2023-04-03 MED ORDER — SODIUM CHLORIDE 0.9% FLUSH: 10.0000 mL | INTRAVENOUS | Status: DC | PRN

## 2023-04-03 MED ORDER — SODIUM CHLORIDE 0.9% FLUSH: 10.0000 mL | Freq: Two times a day (BID) | INTRAVENOUS | Status: DC

## 2023-04-03 MED ORDER — CHLORHEXIDINE GLUCONATE CLOTH 2 % EX PADS
6.0000 | MEDICATED_PAD | Freq: Every day | CUTANEOUS | Status: DC
  Administered 2023-04-03 – 2023-04-06 (×4): 6 via TOPICAL

## 2023-04-03 MED ORDER — LORATADINE 10 MG PO TABS
10.0000 mg | ORAL_TABLET | Freq: Every day | ORAL | Status: DC
  Administered 2023-04-03 – 2023-04-06 (×3): 10 mg via ORAL
  Filled 2023-04-03 (×4): qty 1

## 2023-04-03 MED ORDER — MORPHINE SULFATE (PF) 2 MG/ML IV SOLN
2.0000 mg | INTRAVENOUS | Status: DC | PRN
  Administered 2023-04-03 – 2023-04-05 (×7): 2 mg via INTRAVENOUS
  Filled 2023-04-03 (×8): qty 1

## 2023-04-03 MED ORDER — LACTATED RINGERS IV SOLN: INTRAVENOUS | Status: AC

## 2023-04-03 MED ORDER — HYDROCODONE-ACETAMINOPHEN 5-325 MG PO TABS
1.0000 | ORAL_TABLET | ORAL | Status: DC | PRN
  Administered 2023-04-03: 2 via ORAL
  Administered 2023-04-03: 1 via ORAL
  Administered 2023-04-04 (×2): 2 via ORAL
  Administered 2023-04-04 – 2023-04-06 (×3): 1 via ORAL
  Filled 2023-04-03 (×2): qty 2
  Filled 2023-04-03: qty 1
  Filled 2023-04-03 (×2): qty 2
  Filled 2023-04-03 (×2): qty 1

## 2023-04-03 MED ORDER — ONDANSETRON HCL 4 MG PO TABS
4.0000 mg | ORAL_TABLET | Freq: Four times a day (QID) | ORAL | Status: DC | PRN
  Administered 2023-04-03 – 2023-04-06 (×3): 4 mg via ORAL
  Filled 2023-04-03 (×3): qty 1

## 2023-04-03 MED ORDER — ONDANSETRON HCL 4 MG/2ML IJ SOLN
4.0000 mg | Freq: Four times a day (QID) | INTRAMUSCULAR | Status: DC | PRN
  Administered 2023-04-03 – 2023-04-05 (×5): 4 mg via INTRAVENOUS
  Filled 2023-04-03 (×5): qty 2

## 2023-04-03 NOTE — Consult Note (Signed)
Reason for Consult:abd pain Referring Physician: Dr. Mart Johnson is an 62 y.o. female.  HPI: The patient is a 63 year old white female who presents with abd pain. She has known unresectable pancreatic cancer. Last week she had her pancreatic stent exchanged at [facility]. Since that time she has had RUQ pain. No fever or wbc. CT and US show some evidence of cholecystitis. LFT's are only slightly elevated for 2 values.   Past Medical History:  Diagnosis Date   Arthritis    GERD (gastroesophageal reflux disease)    PONV (postoperative nausea and vomiting)    Prediabetes     Past Surgical History:  Procedure Laterality Date   BILIARY BRUSHING  12/17/2022   Procedure: BILIARY BRUSHING;  Surgeon: Nicole Hawking, MD;  Location: [facility];  Service: Gastroenterology;;   BILIARY STENT PLACEMENT N/A 12/17/2022   Procedure: BILIARY STENT PLACEMENT;  Surgeon: Nicole Hawking, MD;  Location: [facility];  Service: Gastroenterology;  Laterality: N/A;   ERCP N/A 12/17/2022   Procedure: ENDOSCOPIC RETROGRADE CHOLANGIOPANCREATOGRAPHY (ERCP);  Surgeon: Nicole Hawking, MD;  Location: [facility];  Service: Gastroenterology;  Laterality: N/A;   ESOPHAGOGASTRODUODENOSCOPY (EGD) WITH PROPOFOL N/A 12/18/2022   Procedure: ESOPHAGOGASTRODUODENOSCOPY (EGD) WITH PROPOFOL;  Surgeon: Nicole Hawking, MD;  Location: [facility];  Service: Gastroenterology;  Laterality: N/A;   ESOPHAGOGASTRODUODENOSCOPY (EGD) WITH PROPOFOL N/A 12/30/2022   Procedure: ESOPHAGOGASTRODUODENOSCOPY (EGD) WITH PROPOFOL;  Surgeon: Nicole Hawking, MD;  Location: [facility] ENDOSCOPY;  Service: Gastroenterology;  Laterality: N/A;   EUS N/A 12/18/2022   Procedure: UPPER ENDOSCOPIC ULTRASOUND (EUS) LINEAR;  Surgeon: Nicole Hawking, MD;  Location: [facility];  Service: Gastroenterology;  Laterality: N/A;   FINE NEEDLE ASPIRATION N/A 12/18/2022   Procedure: FINE NEEDLE ASPIRATION (FNA) LINEAR;  Surgeon: Nicole Hawking, MD;  Location: [facility];  Service:  Gastroenterology;  Laterality: N/A;   FINE NEEDLE ASPIRATION  12/30/2022   Procedure: FINE NEEDLE ASPIRATION (FNA) LINEAR;  Surgeon: Nicole Hawking, MD;  Location: [facility]COPY;  Service: Gastroenterology;;   LAPAROSCOPIC ABDOMINAL EXPLORATION     SPHINCTEROTOMY  12/17/2022   Procedure: Dennison Mascot;  Surgeon: Nicole Hawking, MD;  Location: [facility];  Service: Gastroenterology;;   UPPER ESOPHAGEAL ENDOSCOPIC ULTRASOUND (EUS) N/A 12/30/2022   Procedure: UPPER ESOPHAGEAL ENDOSCOPIC ULTRASOUND (EUS);  Surgeon: Nicole Hawking, MD;  Location: [facility]COPY;  Service: Gastroenterology;  Laterality: N/A;    Family History  Problem Relation Age of Onset   Diabetes Mother    Stroke Mother    Lung cancer Father 45       smoked   Diabetes Father    Diabetes Sister    Breast cancer Sister 16   Diabetes Brother    Cancer Brother 60       neck cancer   Heart disease Maternal Grandmother    Breast cancer Maternal Grandmother        dx. <50, double mastectomy   Heart disease Maternal Grandfather     Social History:  reports that she has never smoked. She has never used smokeless tobacco. She reports current alcohol use. She reports that she does not use drugs.  Allergies:  Allergies  Allergen Reactions   Other Nausea Only and Other (See Comments)    Certain types of Anesthesia cause SEVERE NAUSEA   Codeine Nausea Only   Meloxicam Nausea Only   Tramadol Nausea Only    Medications: I have reviewed the patient's current medications.  Results for orders placed or performed during the hospital encounter of 04/02/23 (from the past  48 hour(s))  Comprehensive metabolic panel     Status: Abnormal   Collection Time: 04/02/23  3:36 PM  Result Value Ref Range   Sodium 132 (L) 135 - 145 mmol/L   Potassium 3.9 3.5 - 5.1 mmol/L   Chloride 100 98 - 111 mmol/L   CO2 22 22 - 32 mmol/L   Glucose, Bld 105 (H) 70 - 99 mg/dL    Comment: Glucose reference range applies only to samples taken after fasting  for at least 8 hours.   BUN 16 8 - 23 mg/dL   Creatinine, Ser 1.02 0.44 - 1.00 mg/dL   Calcium 8.6 (L) 8.9 - 10.3 mg/dL   Total Protein 6.8 6.5 - 8.1 g/dL   Albumin 3.5 3.5 - 5.0 g/dL   AST 35 15 - 41 U/L   ALT 57 (H) 0 - 44 U/L   Alkaline Phosphatase 137 (H) 38 - 126 U/L   Total Bilirubin 1.7 (H) <1.2 mg/dL   GFR, Estimated >72 >53 mL/min    Comment: (NOTE) Calculated using the CKD-EPI Creatinine Equation (2021)    Anion gap 10 5 - 15    Comment: Performed at [facility], [address], [place], [place] [address]  CBC with Differential     Status: Abnormal   Collection Time: 04/02/23  3:36 PM  Result Value Ref Range   WBC 6.9 4.0 - 10.5 K/uL   RBC 3.91 3.87 - 5.11 MIL/uL   Hemoglobin 11.2 (L) 12.0 - 15.0 g/dL   HCT 34.7 (L) 42.5 - 95.6 %   MCV 89.3 80.0 - 100.0 fL   MCH 28.6 26.0 - 34.0 pg   MCHC 32.1 30.0 - 36.0 g/dL   RDW 38.7 56.4 - 33.2 %   Platelets 86 (L) 150 - 400 K/uL    Comment: SPECIMEN CHECKED FOR CLOTS Immature Platelet Fraction may be clinically indicated, consider ordering this additional test [id number] REPEATED TO VERIFY PLATELET COUNT CONFIRMED BY SMEAR    nRBC 0.0 0.0 - 0.2 %   Neutrophils Relative % 90 %   Neutro Abs 6.2 1.7 - 7.7 K/uL   Lymphocytes Relative 4 %   Lymphs Abs 0.3 (L) 0.7 - 4.0 K/uL   Monocytes Relative 6 %   Monocytes Absolute 0.4 0.1 - 1.0 K/uL   Eosinophils Relative 0 %   Eosinophils Absolute 0.0 0.0 - 0.5 K/uL   Basophils Relative 0 %   Basophils Absolute 0.0 0.0 - 0.1 K/uL   Immature Granulocytes 0 %   Abs Immature Granulocytes 0.03 0.00 - 0.07 K/uL    Comment: Performed at [facility], [address], [place], [place] [address]  Protime-INR     Status: Abnormal   Collection Time: 04/02/23  3:36 PM  Result Value Ref Range   Prothrombin Time 17.6 (H) 11.4 - 15.2 seconds   INR 1.4 (H) 0.8 - 1.2    Comment: (NOTE) INR goal varies based on device and disease states. Performed at [facility], [address], [place], [place] [address]   APTT     Status: Abnormal   Collection Time: 04/02/23  3:36 PM  Result Value Ref Range   aPTT 37 (H) 24 - 36 seconds    Comment:        IF BASELINE aPTT IS ELEVATED, SUGGEST PATIENT RISK ASSESSMENT BE USED TO DETERMINE APPROPRIATE ANTICOAGULANT THERAPY. Performed at [facility], [address] [place], [place] [address]   Lipase, blood  Status: None   Collection Time: 04/02/23  3:36 PM  Result Value Ref Range   Lipase 22 11 - 51 U/L    Comment: Performed at [facility], [address] [place], [place] [address]  Magnesium     Status: None   Collection Time: 04/02/23  3:36 PM  Result Value Ref Range   Magnesium 1.8 1.7 - 2.4 mg/dL    Comment: Performed at [facility], [address] [place], [place] [address]  Blood culture (routine x 2)     Status: None (Preliminary result)   Collection Time: 04/02/23  4:00 PM   Specimen: BLOOD  Result Value Ref Range   Specimen Description      BLOOD RIGHT ANTECUBITAL Performed at [facility], [address] [place], [place] [address]    Special Requests      BOTTLES DRAWN AEROBIC AND ANAEROBIC Blood Culture adequate volume Performed at [facility], [address], [place], [place] [address]    Culture      NO GROWTH < 24 HOURS Performed at [facility], [address] [place], [place] [address]    Report Status PENDING   Urinalysis, w/ Reflex to Culture (Infection Suspected) -Urine, Clean Catch     Status: Abnormal   Collection Time: 04/02/23  4:05 PM  Result Value Ref Range   Specimen Source URINE, CLEAN CATCH    Color, Urine AMBER (A) YELLOW    Comment: BIOCHEMICALS MAY BE AFFECTED BY COLOR   APPearance HAZY (A) CLEAR   Specific Gravity, Urine 1.028 1.005 - 1.030   pH 5.0 5.0 - 8.0   Glucose, UA NEGATIVE NEGATIVE mg/dL   Hgb urine dipstick  NEGATIVE NEGATIVE   Bilirubin Urine NEGATIVE NEGATIVE   Ketones, ur 20 (A) NEGATIVE mg/dL   Protein, ur 30 (A) NEGATIVE mg/dL   Nitrite NEGATIVE NEGATIVE   Leukocytes,Ua TRACE (A) NEGATIVE   RBC / HPF 0-5 0 - 5 RBC/hpf   WBC, UA 0-5 0 - 5 WBC/hpf    Comment:        Reflex urine culture not performed if WBC <=10, OR if Squamous epithelial cells >5. If Squamous epithelial cells >5 suggest recollection.    Bacteria, UA NONE SEEN NONE SEEN   Squamous Epithelial / HPF 0-5 0 - 5 /HPF   Mucus PRESENT     Comment: Performed at [facility], [address], [place], [place] [address]  I-Stat Lactic Acid, ED     Status: None   Collection Time: 04/02/23  4:06 PM  Result Value Ref Range   Lactic Acid, Venous 1.1 0.5 - 1.9 mmol/L  Blood culture (routine x 2)     Status: None (Preliminary result)   Collection Time: 04/02/23  9:59 PM   Specimen: BLOOD  Result Value Ref Range   Specimen Description      BLOOD PORTA CATH Performed at [facility], [address] [place], [place] [address]    Special Requests      BOTTLES DRAWN AEROBIC AND ANAEROBIC Blood Culture adequate volume Performed at [facility], [address], [place], [place] [address]    Culture      NO GROWTH < 12 HOURS Performed at [facility], [address] [place], [place] [address]    Report Status PENDING   I-Stat Lactic Acid, ED     Status: None   Collection Time: 04/02/23 10:13 PM  Result Value Ref Range   Lactic Acid, Venous 0.8 0.5 - 1.9 mmol/L  CBC     Status: Abnormal   Collection Time: 04/03/23  3:55 AM  Result Value Ref Range   WBC 6.8 4.0 - 10.5 K/uL   RBC 3.23 (L) 3.87 - 5.11 MIL/uL   Hemoglobin 9.2 (L) 12.0 - 15.0 g/dL   HCT 40.9 (L) 81.1 - 91.4 %   MCV 89.8 80.0 - 100.0 fL   MCH 28.5 26.0 - 34.0 pg   MCHC 31.7 30.0 - 36.0 g/dL   RDW 78.2 95.6 - 21.3 %   Platelets 75 (L) 150 - 400 K/uL    Comment: SPECIMEN CHECKED FOR CLOTS Immature Platelet  Fraction may be clinically indicated, consider ordering this additional test [id number] CONSISTENT WITH PREVIOUS RESULT REPEATED TO VERIFY    nRBC 0.0 0.0 - 0.2 %    Comment: Performed at [facility], [address], [place], [place] [address]  Comprehensive metabolic panel     Status: Abnormal   Collection Time: 04/03/23  3:55 AM  Result Value Ref Range   Sodium 131 (L) 135 - 145 mmol/L   Potassium 3.1 (L) 3.5 - 5.1 mmol/L   Chloride 100 98 - 111 mmol/L   CO2 20 (L) 22 - 32 mmol/L   Glucose, Bld 108 (H) 70 - 99 mg/dL    Comment: Glucose reference range applies only to samples taken after fasting for at least 8 hours.   BUN 21 8 - 23 mg/dL   Creatinine, Ser 2.84 0.44 - 1.00 mg/dL   Calcium 8.3 (L) 8.9 - 10.3 mg/dL   Total Protein 6.0 (L) 6.5 - 8.1 g/dL   Albumin 3.0 (L) 3.5 - 5.0 g/dL   AST 30 15 - 41 U/L   ALT 46 (H) 0 - 44 U/L   Alkaline Phosphatase 116 38 - 126 U/L   Total Bilirubin 1.5 (H) <1.2 mg/dL   GFR, Estimated >13 >24 mL/min    Comment: (NOTE) Calculated using the CKD-EPI Creatinine Equation (2021)    Anion gap 11 5 - 15    Comment: Performed at [facility], [address] [place], [place] [address]  Protime-INR     Status: Abnormal   Collection Time: 04/03/23  3:55 AM  Result Value Ref Range   Prothrombin Time 19.8 (H) 11.4 - 15.2 seconds   INR 1.7 (H) 0.8 - 1.2    Comment: (NOTE) INR goal varies based on device and disease states. Performed at [facility], [address], [place], [place] [address]     US Abdomen Limited RUQ (LIVER/GB)  Result Date: 04/03/2023 CLINICAL DATA:  Right upper quadrant pain. History of pancreatic cancer. EXAM: ULTRASOUND ABDOMEN LIMITED RIGHT UPPER QUADRANT COMPARISON:  04/02/2023. FINDINGS: Gallbladder: The gallbladder is distended with heterogeneous wall thickening measuring up to 15 mm. No stones are identified. No sonographic Murphy sign noted by sonographer, however  patient has received pain meds. Common bile duct: Diameter: 7.1 mm Liver: A hypervascular hypoechoic lesion is noted at the gallbladder fossa measuring 3.0 x 2.8 x 2.0 cm. Within normal limits in parenchymal echogenicity. Portal vein is patent on color Doppler imaging with normal direction of blood flow towards the liver. Other: None. IMPRESSION: 1. Distended gallbladder with complex thickening of the gallbladder wall measuring up to 15 mm. The possibility of cholecystitis can not be excluded. Surgical consultation is recommended. 2. Hypervascular hypoechoic lesion in the liver at the gallbladder fossa measuring 3.0  cm. MRI with contrast is recommended for further evaluation. Electronically Signed   By: Thornell Sartorius M.D.   On: 04/03/2023 00:41   CT ABDOMEN PELVIS W CONTRAST  Result Date: 04/02/2023 CLINICAL DATA:  Abdominal pain, acute, nonlocalized. Right upper quadrant abdominal pain since having procedure done on Wednesday. EXAM: CT ABDOMEN AND PELVIS WITH CONTRAST TECHNIQUE: Multidetector CT imaging of the abdomen and pelvis was performed using the standard protocol following bolus administration of intravenous contrast. RADIATION DOSE REDUCTION: This exam was performed according to the departmental dose-optimization program which includes automated exposure control, adjustment of the mA and/or kV according to patient size and/or use of iterative reconstruction technique. CONTRAST:  OMNIPAQUE IOHEXOL 300 MG/ML  SOLN COMPARISON:  01/11/2023. FINDINGS: Lower chest: Heart is normal in size and there is a small pericardial effusion. Mild atelectasis is present at the lung bases. Hepatobiliary: No focal abnormality in the liver. Fatty infiltration of the liver is noted. Pneumobilia is noted in the left lobe. The gallbladder is distended. There is cholelithiasis with surrounding inflammatory changes. Fat stranding and a small amount of free fluid are noted at gallbladder fossa, porta hepatis, and  perihepatic space. There is intrahepatic biliary ductal dilatation. A common bile duct stent is noted and appears patent. Pancreas: An ill-defined hypodense region is noted in the pancreatic head, compatible with known mass. There is atrophy of the pancreatic body and tail with pancreatic ductal dilatation measuring 5 mm. The mass encases the celiac artery and SMA. There is encasement of the SMV, portal vein and portal confluence with segmental narrowing. No definite evidence of thrombus. Spleen: The spleen is markedly enlarged at 20.9 cm. Adrenals/Urinary Tract: The adrenal glands are within normal limits. The kidneys enhance symmetrically. Hypodensity is noted in the upper pole of the left kidney, likely cyst. No renal calculus or hydronephrosis. The bladder is unremarkable. Stomach/Bowel: There is mild thickening of the walls of the gastric antrum and proximal duodenum adjacent to the pancreatic mass. No bowel obstruction, free air, or pneumatosis. There is mild diffuse colonic wall thickening with pericolonic fat stranding, most pronounced at the ascending colon and hepatic flexure. Appendix appears normal. Vascular/Lymphatic: Aorta is normal in caliber. Prominent lymph nodes are noted at the porta hepatis and gastrohepatic ligament. Reproductive: Uterus and bilateral adnexa are unremarkable. Other: Small amount of free fluid in the perihepatic space. Musculoskeletal: Degenerative changes are present in the thoracolumbar spine. No acute osseous abnormality. IMPRESSION: 1. Distended gallbladder with cholelithiasis and inflammatory changes at the gallbladder fossa, possible acute cholecystitis. Ultrasound is recommended for further evaluation. 2. Mild intrahepatic biliary ductal dilatation and pneumobilia. Common bile duct stent appears patent. 3. Ill-defined hypodense pancreatic head mass compatible with known pancreatic adenocarcinoma with encasement of the vasculature, as described above. 4. Thickening of the  walls of the gastric antrum and proximal duodenum in the region of the pancreatic mass, possible gastritis/duodenitis. The possibility of infiltrative process can not be excluded. 5. Findings suggestive infectious or inflammatory colitis. 6. Hepatic steatosis. 7. Splenomegaly. Electronically Signed   By: Thornell Sartorius M.D.   On: 04/02/2023 23:09    Review of Systems  Constitutional: Negative.   HENT: Negative.    Eyes: Negative.   Respiratory: Negative.    Cardiovascular: Negative.   Gastrointestinal:  Positive for abdominal pain.  Endocrine: Negative.   Genitourinary: Negative.   Musculoskeletal: Negative.   Skin: Negative.   Allergic/Immunologic: Negative.   Neurological: Negative.   Hematological: Negative.   Psychiatric/Behavioral: Negative.     Blood  pressure 107/64, pulse 91, temperature 98.9 F (37.2 C), resp. rate 18, height 5\' 5"  (1.651 m), weight 72.6 kg, last menstrual period 01/11/2011, SpO2 97%. Physical Exam  Assessment/Plan: The patient appears to have developed acute cholecystitis after manipulation of stent by GI at [facility]. This raises the question of could the stent be malpositioned. I would recommend treating with abx for now. If she does not improve then could consider perc drain. Recommend GI consult to evaluate the stent. Given her underlying disease surgery would be the last option. Will follow  Chevis Pretty III 04/03/2023, 8:49 AM

## 2023-04-03 NOTE — Progress Notes (Signed)
Pharmacy Antibiotic Note  Nicole Johnson is a 62 y.o. female admitted on 04/02/2023 with  intra-abdominal infection .  Pharmacy has been consulted for Zosyn dosing.  Plan: Zosyn 3.375g IV q8h (4 hour infusion). No dose adjustments anticipated.  Pharmacy will sign off and monitor peripherally via electronic surveillance software for any changes in renal function or micro data.   Height: 5\' 5"  (165.1 cm) Weight: 72.6 kg (160 lb) IBW/kg (Calculated) : 57  Temp (24hrs), Avg:98.7 F (37.1 C), Min:97.8 F (36.6 C), Max:99.4 F (37.4 C)  Recent Labs  Lab 04/02/23 1536 04/02/23 1606 04/02/23 2213  WBC 6.9  --   --   CREATININE 0.73  --   --   LATICACIDVEN  --  1.1 0.8    Estimated Creatinine Clearance: 73.7 mL/min (by C-G formula based on SCr of 0.73 mg/dL).    Allergies  Allergen Reactions   Other Nausea Only and Other (See Comments)    Certain types of Anesthesia cause SEVERE NAUSEA   Codeine Nausea Only   Meloxicam Nausea Only   Tramadol Nausea Only   Thank you for allowing pharmacy to be a part of this patient's care.  Junita Push PharmD 04/03/2023 12:51 AM

## 2023-04-03 NOTE — Plan of Care (Signed)
  Problem: Clinical Measurements: Goal: Ability to maintain clinical measurements within normal limits will improve Outcome: Progressing Goal: Will remain free from infection Outcome: Progressing Goal: Diagnostic test results will improve Outcome: Progressing Goal: Respiratory complications will improve Outcome: Progressing Goal: Cardiovascular complication will be avoided Outcome: Progressing   Problem: Elimination: Goal: Will not experience complications related to bowel motility Outcome: Progressing Goal: Will not experience complications related to urinary retention Outcome: Progressing   Problem: Pain Management: Goal: General experience of comfort will improve Outcome: Progressing   Problem: Safety: Goal: Ability to remain free from injury will improve Outcome: Progressing   Problem: Skin Integrity: Goal: Risk for impaired skin integrity will decrease Outcome: Progressing

## 2023-04-03 NOTE — Progress Notes (Signed)
62 year old recently diagnosed with pancreatic adenocarcinoma complicated by biliary stricture status post ERCP and stent placement, repeat ERCP and exchange of metal stent 11/6 present to the ER with severe abdominal pain and nausea.  She is planned to start treatment with Xeloda and radiation next week.  In the emergency room she was found to be tachypneic, on room air.  Blood pressure adequate.  Platelets 86,000 which is chronic.  Bilirubin 1.7.  CT scan with distended gallbladder with gallstone suspected acute cholecystitis.  Admitted with surgical consultation.  Patient seen and examined.  Admitted early morning hours by nighttime hospitalist.  Husband was at the bedside.  She has mild pain.  She had just discussed case with surgery and aware about conservative management plan.  Patient was wondering about eating.  Will give clear liquid diet.  Plan: Clears.  Adequate pain medications.  IV fluids.  IV Zosyn.  Repeat LFTs tomorrow.   Same-day admit.  No charge visit.

## 2023-04-03 NOTE — Plan of Care (Signed)
  Problem: Education: Goal: Knowledge of General Education information will improve Description: Including pain rating scale, medication(s)/side effects and non-pharmacologic comfort measures Outcome: Progressing   Problem: Clinical Measurements: Goal: Ability to maintain clinical measurements within normal limits will improve Outcome: Progressing   Problem: Nutrition: Goal: Adequate nutrition will be maintained Outcome: Progressing   Problem: Elimination: Goal: Will not experience complications related to bowel motility Outcome: Progressing   Problem: Pain Management: Goal: General experience of comfort will improve Outcome: Progressing   Problem: Safety: Goal: Ability to remain free from injury will improve Outcome: Progressing   Problem: Skin Integrity: Goal: Risk for impaired skin integrity will decrease Outcome: Progressing

## 2023-04-03 NOTE — Plan of Care (Signed)
  Problem: Coping: Goal: Level of anxiety will decrease Outcome: Progressing   Problem: Pain Management: Goal: General experience of comfort will improve Outcome: Progressing

## 2023-04-03 NOTE — ED Notes (Signed)
ED TO INPATIENT HANDOFF REPORT  ED Nurse Name and Phone #: Jacqulyn Liner EMTP  S Name/Age/Gender Nicole Johnson 62 y.o. female Room/Bed: WA22/WA22  Code Status   Code Status: Prior  Home/SNF/Other Home Patient oriented to: self, place, time, and situation Is this baseline? Yes   Triage Complete: Triage complete  Chief Complaint Acute cholecystitis [K81.0]  Triage Note Patient arrives ambulatory by POV c/o fatigue, headaches, chills and right upper abdominal pain since having procedure done Wednesday.    Allergies Allergies  Allergen Reactions   Other Nausea Only and Other (See Comments)    Certain types of Anesthesia cause SEVERE NAUSEA   Codeine Nausea Only   Meloxicam Nausea Only   Tramadol Nausea Only    Level of Care/Admitting Diagnosis ED Disposition     ED Disposition  Admit   Condition  --   Comment  Hospital Area: [facility] [[id number]]  Level of Care: Med-Surg [16]  May admit patient to [facility] or [facility] if equivalent level of care is available:: No  Covid Evaluation: Asymptomatic - no recent exposure (last 10 days) testing not required  Diagnosis: Acute cholecystitis [575.0.ICD-9-CM]  Admitting Physician: Charlsie Quest [[id number]]  Attending Physician: Charlsie Quest [[id number]]  Certification:: I certify this patient will need inpatient services for at least 2 midnights  Expected Medical Readiness: 04/06/2023          B Medical/Surgery History Past Medical History:  Diagnosis Date   Arthritis    GERD (gastroesophageal reflux disease)    PONV (postoperative nausea and vomiting)    Prediabetes    Past Surgical History:  Procedure Laterality Date   BILIARY BRUSHING  12/17/2022   Procedure: BILIARY BRUSHING;  Surgeon: Jeani Hawking, MD;  Location: [facility];  Service: Gastroenterology;;   BILIARY STENT PLACEMENT N/A 12/17/2022   Procedure: BILIARY STENT PLACEMENT;  Surgeon: Jeani Hawking, MD;  Location: [facility]  ENDOSCOPY;  Service: Gastroenterology;  Laterality: N/A;   ERCP N/A 12/17/2022   Procedure: ENDOSCOPIC RETROGRADE CHOLANGIOPANCREATOGRAPHY (ERCP);  Surgeon: Jeani Hawking, MD;  Location: [facility];  Service: Gastroenterology;  Laterality: N/A;   ESOPHAGOGASTRODUODENOSCOPY (EGD) WITH PROPOFOL N/A 12/18/2022   Procedure: ESOPHAGOGASTRODUODENOSCOPY (EGD) WITH PROPOFOL;  Surgeon: Jeani Hawking, MD;  Location: [facility];  Service: Gastroenterology;  Laterality: N/A;   ESOPHAGOGASTRODUODENOSCOPY (EGD) WITH PROPOFOL N/A 12/30/2022   Procedure: ESOPHAGOGASTRODUODENOSCOPY (EGD) WITH PROPOFOL;  Surgeon: Jeani Hawking, MD;  Location: [facility] END[facility]COPY;  Service: Gastroenterology;  Laterality: N/A;   EUS N/A 12/18/2022   Procedure: UPPER ENDOSCOPIC ULTRASOUND (EUS) LINEAR;  Surgeon: Jeani Hawking, MD;  Location: [facility];  Service: Gastroenterology;  Laterality: N/A;   FINE NEEDLE ASPIRATION N/A 12/18/2022   Procedure: FINE NEEDLE ASPIRATION (FNA) LINEAR;  Surgeon: Jeani Hawking, MD;  Location: [facility];  Service: Gastroenterology;  Laterality: N/A;   FINE NEEDLE ASPIRATION  12/30/2022   Procedure: FINE NEEDLE ASPIRATION (FNA) LINEAR;  Surgeon: Jeani Hawking, MD;  Location: [facility] ENDOSCOPY;  Service: Gastroenterology;;   LAPAROSCOPIC ABDOMINAL EXPLORATION     SPHINCTEROTOMY  12/17/2022   Procedure: Dennison Mascot;  Surgeon: Jeani Hawking, MD;  Location: [facility];  Service: Gastroenterology;;   UPPER ESOPHAGEAL ENDOSCOPIC ULTRASOUND (EUS) N/A 12/30/2022   Procedure: UPPER ESOPHAGEAL ENDOSCOPIC ULTRASOUND (EUS);  Surgeon: Jeani Hawking, MD;  Location: [facility] ENDOSCOPY;  Service: Gastroenterology;  Laterality: N/A;     A IV Location/Drains/Wounds Patient Lines/Drains/Airways Status     Active Line/Drains/Airways     Name Placement date Placement time Site Days   Implanted [place]  Right Chest --  --  Chest  --   GI Stent 12/17/22  1100  --  107            Intake/Output Last 24 hours No intake or output  data in the 24 hours ending 04/03/23 0044  Labs/Imaging Results for orders placed or performed during the hospital encounter of 04/02/23 (from the past 48 hour(s))  Comprehensive metabolic panel     Status: Abnormal   Collection Time: 04/02/23  3:36 PM  Result Value Ref Range   Sodium 132 (L) 135 - 145 mmol/L   Potassium 3.9 3.5 - 5.1 mmol/L   Chloride 100 98 - 111 mmol/L   CO2 22 22 - 32 mmol/L   Glucose, Bld 105 (H) 70 - 99 mg/dL    Comment: Glucose reference range applies only to samples taken after fasting for at least 8 hours.   BUN 16 8 - 23 mg/dL   Creatinine, Ser 1.61 0.44 - 1.00 mg/dL   Calcium 8.6 (L) 8.9 - 10.3 mg/dL   Total Protein 6.8 6.5 - 8.1 g/dL   Albumin 3.5 3.5 - 5.0 g/dL   AST 35 15 - 41 U/L   ALT 57 (H) 0 - 44 U/L   Alkaline Phosphatase 137 (H) 38 - 126 U/L   Total Bilirubin 1.7 (H) <1.2 mg/dL   GFR, Estimated >09 >60 mL/min    Comment: (NOTE) Calculated using the CKD-EPI Creatinine Equation (2021)    Anion gap 10 5 - 15    Comment: Performed at [facility], [address], [place], [place] [address]  CBC with Differential     Status: Abnormal   Collection Time: 04/02/23  3:36 PM  Result Value Ref Range   WBC 6.9 4.0 - 10.5 K/uL   RBC 3.91 3.87 - 5.11 MIL/uL   Hemoglobin 11.2 (L) 12.0 - 15.0 g/dL   HCT 81.1 (L) 91.4 - 78.2 %   MCV 89.3 80.0 - 100.0 fL   MCH 28.6 26.0 - 34.0 pg   MCHC 32.1 30.0 - 36.0 g/dL   RDW 95.6 21.3 - 08.6 %   Platelets 86 (L) 150 - 400 K/uL    Comment: SPECIMEN CHECKED FOR CLOTS Immature Platelet Fraction may be clinically indicated, consider ordering this additional test [id number] REPEATED TO VERIFY PLATELET COUNT CONFIRMED BY SMEAR    nRBC 0.0 0.0 - 0.2 %   Neutrophils Relative % 90 %   Neutro Abs 6.2 1.7 - 7.7 K/uL   Lymphocytes Relative 4 %   Lymphs Abs 0.3 (L) 0.7 - 4.0 K/uL   Monocytes Relative 6 %   Monocytes Absolute 0.4 0.1 - 1.0 K/uL   Eosinophils Relative 0 %   Eosinophils Absolute 0.0  0.0 - 0.5 K/uL   Basophils Relative 0 %   Basophils Absolute 0.0 0.0 - 0.1 K/uL   Immature Granulocytes 0 %   Abs Immature Granulocytes 0.03 0.00 - 0.07 K/uL    Comment: Performed at [facility], [address], [place], [place] [address]  Protime-INR     Status: Abnormal   Collection Time: 04/02/23  3:36 PM  Result Value Ref Range   Prothrombin Time 17.6 (H) 11.4 - 15.2 seconds   INR 1.4 (H) 0.8 - 1.2    Comment: (NOTE) INR goal varies based on device and disease states. Performed at [facility], [address], [place], [place] [address]   APTT     Status: Abnormal   Collection  Time: 04/02/23  3:36 PM  Result Value Ref Range   aPTT 37 (H) 24 - 36 seconds    Comment:        IF BASELINE aPTT IS ELEVATED, SUGGEST PATIENT RISK ASSESSMENT BE USED TO DETERMINE APPROPRIATE ANTICOAGULANT THERAPY. Performed at [facility], [address] [place], [place] [address]   Lipase, blood     Status: None   Collection Time: 04/02/23  3:36 PM  Result Value Ref Range   Lipase 22 11 - 51 U/L    Comment: Performed at [facility], [address] [place], [place] [address]  Magnesium     Status: None   Collection Time: 04/02/23  3:36 PM  Result Value Ref Range   Magnesium 1.8 1.7 - 2.4 mg/dL    Comment: Performed at [facility], [address] [place], [place] [address]  Urinalysis, w/ Reflex to Culture (Infection Suspected) -Urine, Clean Catch     Status: Abnormal   Collection Time: 04/02/23  4:05 PM  Result Value Ref Range   Specimen Source URINE, CLEAN CATCH    Color, Urine AMBER (A) YELLOW    Comment: BIOCHEMICALS MAY BE AFFECTED BY COLOR   APPearance HAZY (A) CLEAR   Specific Gravity, Urine 1.028 1.005 - 1.030   pH 5.0 5.0 - 8.0   Glucose, UA NEGATIVE NEGATIVE mg/dL   Hgb urine dipstick NEGATIVE NEGATIVE   Bilirubin Urine NEGATIVE NEGATIVE   Ketones, ur 20 (A) NEGATIVE mg/dL    Protein, ur 30 (A) NEGATIVE mg/dL   Nitrite NEGATIVE NEGATIVE   Leukocytes,Ua TRACE (A) NEGATIVE   RBC / HPF 0-5 0 - 5 RBC/hpf   WBC, UA 0-5 0 - 5 WBC/hpf    Comment:        Reflex urine culture not performed if WBC <=10, OR if Squamous epithelial cells >5. If Squamous epithelial cells >5 suggest recollection.    Bacteria, UA NONE SEEN NONE SEEN   Squamous Epithelial / HPF 0-5 0 - 5 /HPF   Mucus PRESENT     Comment: Performed at [facility], [address], [place], [place] [address]  I-Stat Lactic Acid, ED     Status: None   Collection Time: 04/02/23  4:06 PM  Result Value Ref Range   Lactic Acid, Venous 1.1 0.5 - 1.9 mmol/L  I-Stat Lactic Acid, ED     Status: None   Collection Time: 04/02/23 10:13 PM  Result Value Ref Range   Lactic Acid, Venous 0.8 0.5 - 1.9 mmol/L   US Abdomen Limited RUQ (LIVER/GB)  Result Date: 04/03/2023 CLINICAL DATA:  Right upper quadrant pain. History of pancreatic cancer. EXAM: ULTRASOUND ABDOMEN LIMITED RIGHT UPPER QUADRANT COMPARISON:  04/02/2023. FINDINGS: Gallbladder: The gallbladder is distended with heterogeneous wall thickening measuring up to 15 mm. No stones are identified. No sonographic Murphy sign noted by sonographer, however patient has received pain meds. Common bile duct: Diameter: 7.1 mm Liver: A hypervascular hypoechoic lesion is noted at the gallbladder fossa measuring 3.0 x 2.8 x 2.0 cm. Within normal limits in parenchymal echogenicity. Portal vein is patent on color Doppler imaging with normal direction of blood flow towards the liver. Other: None. IMPRESSION: 1. Distended gallbladder with complex thickening of the gallbladder wall measuring up to 15 mm. The possibility of cholecystitis can not be excluded. Surgical consultation is recommended. 2. Hypervascular hypoechoic lesion in the liver at the gallbladder fossa measuring 3.0 cm. MRI with contrast is recommended for further  evaluation. Electronically Signed   By:  Thornell Sartorius M.D.   On: 04/03/2023 00:41   CT ABDOMEN PELVIS W CONTRAST  Result Date: 04/02/2023 CLINICAL DATA:  Abdominal pain, acute, nonlocalized. Right upper quadrant abdominal pain since having procedure done on Wednesday. EXAM: CT ABDOMEN AND PELVIS WITH CONTRAST TECHNIQUE: Multidetector CT imaging of the abdomen and pelvis was performed using the standard protocol following bolus administration of intravenous contrast. RADIATION DOSE REDUCTION: This exam was performed according to the departmental dose-optimization program which includes automated exposure control, adjustment of the mA and/or kV according to patient size and/or use of iterative reconstruction technique. CONTRAST:  OMNIPAQUE IOHEXOL 300 MG/ML  SOLN COMPARISON:  01/11/2023. FINDINGS: Lower chest: Heart is normal in size and there is a small pericardial effusion. Mild atelectasis is present at the lung bases. Hepatobiliary: No focal abnormality in the liver. Fatty infiltration of the liver is noted. Pneumobilia is noted in the left lobe. The gallbladder is distended. There is cholelithiasis with surrounding inflammatory changes. Fat stranding and a small amount of free fluid are noted at gallbladder fossa, porta hepatis, and perihepatic space. There is intrahepatic biliary ductal dilatation. A common bile duct stent is noted and appears patent. Pancreas: An ill-defined hypodense region is noted in the pancreatic head, compatible with known mass. There is atrophy of the pancreatic body and tail with pancreatic ductal dilatation measuring 5 mm. The mass encases the celiac artery and SMA. There is encasement of the SMV, portal vein and portal confluence with segmental narrowing. No definite evidence of thrombus. Spleen: The spleen is markedly enlarged at 20.9 cm. Adrenals/Urinary Tract: The adrenal glands are within normal limits. The kidneys enhance symmetrically. Hypodensity is noted in the upper pole of the left kidney, likely cyst.  No renal calculus or hydronephrosis. The bladder is unremarkable. Stomach/Bowel: There is mild thickening of the walls of the gastric antrum and proximal duodenum adjacent to the pancreatic mass. No bowel obstruction, free air, or pneumatosis. There is mild diffuse colonic wall thickening with pericolonic fat stranding, most pronounced at the ascending colon and hepatic flexure. Appendix appears normal. Vascular/Lymphatic: Aorta is normal in caliber. Prominent lymph nodes are noted at the porta hepatis and gastrohepatic ligament. Reproductive: Uterus and bilateral adnexa are unremarkable. Other: Small amount of free fluid in the perihepatic space. Musculoskeletal: Degenerative changes are present in the thoracolumbar spine. No acute osseous abnormality. IMPRESSION: 1. Distended gallbladder with cholelithiasis and inflammatory changes at the gallbladder fossa, possible acute cholecystitis. Ultrasound is recommended for further evaluation. 2. Mild intrahepatic biliary ductal dilatation and pneumobilia. Common bile duct stent appears patent. 3. Ill-defined hypodense pancreatic head mass compatible with known pancreatic adenocarcinoma with encasement of the vasculature, as described above. 4. Thickening of the walls of the gastric antrum and proximal duodenum in the region of the pancreatic mass, possible gastritis/duodenitis. The possibility of infiltrative process can not be excluded. 5. Findings suggestive infectious or inflammatory colitis. 6. Hepatic steatosis. 7. Splenomegaly. Electronically Signed   By: Thornell Sartorius M.D.   On: 04/02/2023 23:09    Pending Labs Unresulted Labs (From admission, onward)     Start     Ordered   04/02/23 1537  Blood culture (routine x 2)  BLOOD CULTURE X 2,   R      04/02/23 1536            Vitals/Pain Today's Vitals   04/02/23 1527 04/02/23 1909 04/02/23 2228 04/02/23 2344  BP:  114/66 121/63   Pulse:  Marland Kitchen)  113 100   Resp:  18 18   Temp:  99 F (37.2 C) 99.4 F  (37.4 C)   TempSrc:  Oral Oral   SpO2:  99% 97%   Weight:      Height:      PainSc: 8    5     Isolation Precautions No active isolations  Medications Medications  fentaNYL (SUBLIMAZE) injection 50 mcg (50 mcg Intravenous Given 04/02/23 2343)  iohexol (OMNIPAQUE) 300 MG/ML solution 100 mL (100 mLs Intravenous Contrast Given 04/02/23 2214)  morphine (PF) 4 MG/ML injection 4 mg (4 mg Intravenous Given 04/02/23 2200)  sodium chloride 0.9 % bolus 1,000 mL (0 mLs Intravenous Stopped 04/03/23 0006)  ondansetron (ZOFRAN) injection 4 mg (4 mg Intravenous Given 04/02/23 2201)  piperacillin-tazobactam (ZOSYN) IVPB 3.375 g (0 g Intravenous Stopped 04/03/23 0009)    Mobility walks     Focused Assessments Patient came in for post op problem from abdominal procedure Wednesday. Patient has received of fentanyl with some pain relief. Patient is alert and oriented. Husband is at bedside at this moment. Patient has not expressed concerns at this time. Patient does have port accessed and has been receiving meds through that line.    R Recommendations: See Admitting Provider Note  Report given to:   Additional Notes:

## 2023-04-03 NOTE — H&P (Signed)
History and Physical    [name]:[id number] DOB: 01/14/1961 DOA: 04/02/2023  PCP: Judy Pimple, MD  Patient coming from: Home  I have personally briefly reviewed patient's old medical records in [facility]  Chief Complaint: Abdominal pain  HPI: Nicole Johnson is a 62 y.o. female with medical history significant for pancreatic adenocarcinoma complicated by biliary stricture s/p ERCP with plastic stent placed 11/27/2022 recent repeat ERCP and exchange to metal stent 03/30/2023 who presented to the ED for evaluation of abdominal pain.  Patient with relatively recent diagnosis of locally advanced unresectable pancreatic head adenocarcinoma.  She had associated biliary stricture and underwent ERCP on 11/27/2022 with plastic stent placement.  She has been following with oncology locally, Dr. Mosetta Putt, as well as [facility] oncology, GI, and Rad/Onc services.  She is planned to start on treatment with Xeloda and concurrent radiation but has not yet started.  Previous chemotherapy had to be held due to issues with thrombocytopenia.  She recently underwent repeat ERCP 03/30/2023 for metal stent exchange.  Patient states over the last 2 days she has been generally fatigued.  She has been experiencing generalized abdominal pain with poor appetite.  She has not been eating very much at all.  She reports subjective fevers, chills, and diaphoresis.  Today pain localized to the right upper quadrant and became more severe.  She has had some nausea.  ED Course  Labs/Imaging on admission: I have personally reviewed following labs and imaging studies.  Initial vital showed BP 127/79, pulse 113, RR 18, temp 97.8 F, SpO2 98% on room air.  Labs show WBC 6.9, hemoglobin 11.2, platelets 86,000, sodium 132, potassium 3.9, bicarb 22, BUN 16, creatinine 0.73, serum glucose 105, AST 35, ALT 57, alk phos 137, total bilirubin 1.7, magnesium 1.8, lipase 22, lactic acid 1.1 > 0.8.  UA negative for UTI.  Blood cultures in  process.  CT abdomen/pelvis with contrast showed distended gallbladder with cholelithiasis and inflammatory changes at the gallbladder fossa, possible acute cholecystitis.  Mild intrahepatic biliary ductal dilatation and pneumobilia.  Common bile duct stent appears patent.  Ill-defined hypodense pancreatic head mass compatible with known pancreatic adenocarcinoma with encasement of the vasculature.  Thickening of the walls of the gastric antrum and proximal duodenum in the region of the pancreatic mass.  Findings suggestive of infectious or inflammatory colitis, hepatic steatosis, splenomegaly also noted.  RUQ ultrasound pending.  Patient was given 1 L normal saline, IV Zosyn, IV morphine.  EDP discussed with on-call general surgery Dr. Magnus Ivan who recommended medical admission and their team will see in consultation.  The hospitalist service was consulted to admit for further evaluation and management.  Review of Systems: All systems reviewed and are negative except as documented in history of present illness above.   Past Medical History:  Diagnosis Date   Arthritis    GERD (gastroesophageal reflux disease)    PONV (postoperative nausea and vomiting)    Prediabetes     Past Surgical History:  Procedure Laterality Date   BILIARY BRUSHING  12/17/2022   Procedure: BILIARY BRUSHING;  Surgeon: Jeani Hawking, MD;  Location: [facility];  Service: Gastroenterology;;   BILIARY STENT PLACEMENT N/A 12/17/2022   Procedure: BILIARY STENT PLACEMENT;  Surgeon: Jeani Hawking, MD;  Location: [facility] ENDOSCOPY;  Service: Gastroenterology;  Laterality: N/A;   ERCP N/A 12/17/2022   Procedure: ENDOSCOPIC RETROGRADE CHOLANGIOPANCREATOGRAPHY (ERCP);  Surgeon: Jeani Hawking, MD;  Location: [facility];  Service: Gastroenterology;  Laterality: N/A;   ESOPHAGOGASTRODUODENOSCOPY (EGD)  WITH PROPOFOL N/A 12/18/2022   Procedure: ESOPHAGOGASTRODUODENOSCOPY (EGD) WITH PROPOFOL;  Surgeon: Jeani Hawking, MD;  Location: [facility]  ENDOSCOPY;  Service: Gastroenterology;  Laterality: N/A;   ESOPHAGOGASTRODUODENOSCOPY (EGD) WITH PROPOFOL N/A 12/30/2022   Procedure: ESOPHAGOGASTRODUODENOSCOPY (EGD) WITH PROPOFOL;  Surgeon: Jeani Hawking, MD;  Location: [facility] ENDOSCOPY;  Service: Gastroenterology;  Laterality: N/A;   EUS N/A 12/18/2022   Procedure: UPPER ENDOSCOPIC ULTRASOUND (EUS) LINEAR;  Surgeon: Jeani Hawking, MD;  Location: [facility] ENDOSCOPY;  Service: Gastroenterology;  Laterality: N/A;   FINE NEEDLE ASPIRATION N/A 12/18/2022   Procedure: FINE NEEDLE ASPIRATION (FNA) LINEAR;  Surgeon: Jeani Hawking, MD;  Location: [facility] ENDOSCOPY;  Service: Gastroenterology;  Laterality: N/A;   FINE NEEDLE ASPIRATION  12/30/2022   Procedure: FINE NEEDLE ASPIRATION (FNA) LINEAR;  Surgeon: Jeani Hawking, MD;  Location: [facility] ENDOSCOPY;  Service: Gastroenterology;;   LAPAROSCOPIC ABDOMINAL EXPLORATION     SPHINCTEROTOMY  12/17/2022   Procedure: Dennison Mascot;  Surgeon: Jeani Hawking, MD;  Location: [facility]OS[facility];  Service: Gastroenterology;;   UPPER ESOPHAGEAL ENDOSCOPIC ULTRASOUND (EUS) N/A 12/30/2022   Procedure: UPPER ESOPHAGEAL ENDOSCOPIC ULTRASOUND (EUS);  Surgeon: Jeani Hawking, MD;  Location: [facility] ENDOSCOPY;  Service: Gastroenterology;  Laterality: N/A;    Social History:  reports that she has never smoked. She has never used smokeless tobacco. She reports current alcohol use. She reports that she does not use drugs.  Allergies  Allergen Reactions   Other Nausea Only and Other (See Comments)    Certain types of Anesthesia cause SEVERE NAUSEA   Codeine Nausea Only   Meloxicam Nausea Only   Tramadol Nausea Only    Family History  Problem Relation Age of Onset   Diabetes Mother    Stroke Mother    Lung cancer Father 89       smoked   Diabetes Father    Diabetes Sister    Breast cancer Sister 36   Diabetes Brother    Cancer Brother 60       neck cancer   Heart disease Maternal Grandmother    Breast cancer Maternal Grandmother        dx. <50,  double mastectomy   Heart disease Maternal Grandfather      Prior to Admission medications   Medication Sig Start Date End Date Taking? Authorizing Provider  ADVIL 200 MG CAPS Take 400-600 mg by mouth every 6 (six) hours as needed (for pain).    [provider]  Calcium Carb-Cholecalciferol (CALTRATE 600+D3 PO) Take 1 tablet by mouth daily with breakfast.    [provider]  CREON 24000-76000 units CPEP Take 3 capsules by mouth before each meal, and take 1 capsule before snacks. 02/25/23   [provider]  Ferrous Sulfate (IRON) 325 (65 Fe) MG TABS Take 325 mg by mouth daily with breakfast.    [provider]  fluticasone (FLONASE) 50 MCG/ACT nasal spray Place 2 sprays into both nostrils daily. 09/09/22   Tower, Audrie Gallus, MD  MAGNESIUM PO Take 1 tablet by mouth daily.    [provider]  multivitamin-lutein (OCUVITE-LUTEIN) CAPS capsule Take 1 capsule by mouth daily.    [provider]  NON FORMULARY Apply 1 application  topically See admin instructions. Hot Cream  Muscle & Joint Cream/Capsaicin- based- Apply to painful or itchy areas as directed/as needed for relief    [provider]  omeprazole (PRILOSEC) 20 MG capsule TAKE 1 CAPSULE(20 MG) BY MOUTH DAILY Patient taking differently: Take 20 mg by mouth 2 (two) times daily before a meal.  11/22/19   Emi Belfast, FNP  Pyridoxine HCl (VITAMIN B6 PO) Take 1 tablet by mouth daily.    [provider]  triamcinolone cream (KENALOG) 0.1 % Apply 1 Application topically 2 (two) times daily as needed. 12/07/22   Karie Schwalbe, MD  TURMERIC PO Take 2 capsules by mouth daily.    [provider]  vitamin B-12 (CYANOCOBALAMIN) 500 MCG tablet Take 500 mcg by mouth daily.    [provider]    Physical Exam: Vitals:   04/02/23 1522 04/02/23 1909 04/02/23 2228 04/03/23 0045  BP: 127/79 114/66 121/63 109/69  Pulse: (!) 113 (!) 113 100 94  Resp: 18 18 18     Temp: 97.8 F (36.6 C) 99 F (37.2 C) 99.4 F (37.4 C)   TempSrc: Oral Oral Oral   SpO2: 98% 99% 97% 98%  Weight: 72.6 kg     Height: 5\' 5"  (1.651 m)      Constitutional: Sitting up in bed, NAD, calm, comfortable Eyes: EOMI, lids and conjunctivae normal ENMT: Mucous membranes are moist. Posterior pharynx clear of any exudate or lesions.Normal dentition.  Neck: normal, supple, no masses. Respiratory: clear to auscultation bilaterally, no wheezing, no crackles. Normal respiratory effort. No accessory muscle use.  Cardiovascular: Regular rate and rhythm, no murmurs / rubs / gallops. No extremity edema. 2+ pedal pulses.  Port-A-Cath in place. Abdomen: RUQ tenderness, no masses palpated. Musculoskeletal: no clubbing / cyanosis. No joint deformity upper and lower extremities. Good ROM, no contractures. Normal muscle tone.  Skin: no rashes, lesions, ulcers. No induration Neurologic: .Sensation intact. Strength 5/5 in all 4.  Psychiatric: Normal judgment and insight. Alert and oriented x 3. Normal mood.   EKG: Not performed.  Assessment/Plan Principal Problem:   Acute cholecystitis Active Problems:   Pancreatic cancer (HCC)   Thrombocytopenia (HCC)   Hyponatremia   Nicole Johnson is a 62 y.o. female with medical history significant for pancreatic adenocarcinoma complicated by biliary stricture s/p ERCP with plastic stent placed 11/27/2022 recent repeat ERCP and exchange to metal stent 03/30/2023 who is admitted with acute cholecystitis.  Assessment and Plan: Acute cholecystitis: Patient presenting with RUQ abdominal pain after recent ERCP for metal biliary stent exchange.  CT imaging concerning for possible acute cholecystitis.  Cholelithiasis noted.  CBD stent appears patent on CT.  Mild ALT, alk phos and T. bili elevations seen. -General Surgery to follow -Follow-up RUQ ultrasound -Continue IV Zosyn -Follow blood cultures -Keep n.p.o. tonight -IV fluid hydration overnight -Continue  IV antiemetics and analgesics as needed -Trend LFTs  Pancreatic adenocarcinoma with biliary stricture s/p ERCP and metal stent exchange 03/30/2023: Following with oncology Dr. Mosetta Putt locally as well as [facility] oncology, Rad/Onc, and GI.  Plan is to start on treatment with Xeloda and concurrent radiation.  Patient states she is scheduled to start radiation treatment on 11/14.  Thrombocytopenia: Mild without obvious bleeding.  Continue to monitor.  Hyponatremia: Mild secondary to slight volume depletion from poor oral intake.  Continue IV fluid hydration and recheck labs in AM.   DVT prophylaxis: SCDs Start: 04/03/23 0044 Code Status: Full code, confirmed with patient on admission Family Communication: Husband at bedside Disposition Plan: From home, dispo pending clinical progress Consults called: General Surgery Severity of Illness: The appropriate patient status for this patient is INPATIENT. Inpatient status is judged to be reasonable and necessary in order to provide the required intensity of service to ensure the patient's safety. The patient's presenting symptoms, physical exam findings, and initial radiographic  and laboratory data in the context of their chronic comorbidities is felt to place them at high risk for further clinical deterioration. Furthermore, it is not anticipated that the patient will be medically stable for discharge from the hospital within 2 midnights of admission.   * I certify that at the point of admission it is my clinical judgment that the patient will require inpatient hospital care spanning beyond 2 midnights from the point of admission due to high intensity of service, high risk for further deterioration and high frequency of surveillance required.Darreld Mclean MD [facility]  If 7PM-7AM, please contact night-coverage [email or web address]  04/03/2023, 12:52 AM

## 2023-04-03 NOTE — Hospital Course (Signed)
Nicole Johnson is a 62 y.o. female with medical history significant for pancreatic adenocarcinoma complicated by biliary stricture s/p ERCP with plastic stent placed 11/27/2022 recent repeat ERCP and exchange to metal stent 03/30/2023 who is admitted with acute cholecystitis.

## 2023-04-03 NOTE — Assessment & Plan Note (Deleted)
cT2N0M0, stage IB, unresectable  -Patient presented with jaundice, abdominal MRI showed a poorly differentiated hypervascular area in the head of pancreas, measuring 3.5 x 3.0 cm.  The mass unfortunately invades multiple vascular structures including celiac artery, the proximal SMA and SMV -Status post ERCP and biliary stent placement. Dr. Elnoria Howard performed EUS and fine-needle biopsy of the pancreatic mass twice, and the second biopsy confirmed adenocarcinoma.  -Unfortunately her pancreatic mass is not resectable due to the invading celiac artery and SMA, case was discussed in GI tumor conference few days ago.  She also has a small lesion in the liver on the recent CT scan which was not seen on the previous MRI.  Plan to repeat MRI in 3 months.  I reviewed the above with patient and her husband in detail today. -We discussed the incurable nature of her cancer, and treatment options, she includes chemotherapy and radiation. -She has good performance status, I recommend NALIRIFOX as first-line chemotherapy.  We discussed chemotherapy for 3 to 6 months, followed by SBRT radiation, or continue chemotherapy until disease progression. -Chemotherapy consent: Side effects including but does not not limited to, fatigue, nausea, vomiting, diarrhea, hair loss, neuropathy, fluid retention, renal and kidney dysfunction, neutropenic fever, needed for blood transfusion, bleeding, were discussed with patient in great detail. She agrees to proceed. -She understands the goal of chemotherapy is palliative -Plan to start in 1 to 2 weeks.

## 2023-04-04 ENCOUNTER — Inpatient Hospital Stay: Payer: BC Managed Care – PPO

## 2023-04-04 ENCOUNTER — Inpatient Hospital Stay: Payer: BC Managed Care – PPO | Admitting: Hematology

## 2023-04-04 DIAGNOSIS — K810 Acute cholecystitis: Secondary | ICD-10-CM | POA: Diagnosis not present

## 2023-04-04 LAB — CBC WITH DIFFERENTIAL/PLATELET
Abs Immature Granulocytes: 0.06 10*3/uL (ref 0.00–0.07)
Basophils Absolute: 0 10*3/uL (ref 0.0–0.1)
Basophils Relative: 0 %
Eosinophils Absolute: 0 10*3/uL (ref 0.0–0.5)
Eosinophils Relative: 0 %
HCT: 31.5 % — ABNORMAL LOW (ref 36.0–46.0)
Hemoglobin: 9.9 g/dL — ABNORMAL LOW (ref 12.0–15.0)
Immature Granulocytes: 1 %
Lymphocytes Relative: 4 %
Lymphs Abs: 0.3 10*3/uL — ABNORMAL LOW (ref 0.7–4.0)
MCH: 28.4 pg (ref 26.0–34.0)
MCHC: 31.4 g/dL (ref 30.0–36.0)
MCV: 90.5 fL (ref 80.0–100.0)
Monocytes Absolute: 0.5 10*3/uL (ref 0.1–1.0)
Monocytes Relative: 8 %
Neutro Abs: 5.9 10*3/uL (ref 1.7–7.7)
Neutrophils Relative %: 87 %
Platelets: 97 10*3/uL — ABNORMAL LOW (ref 150–400)
RBC: 3.48 MIL/uL — ABNORMAL LOW (ref 3.87–5.11)
RDW: 15.3 % (ref 11.5–15.5)
WBC: 6.8 10*3/uL (ref 4.0–10.5)
nRBC: 0 % (ref 0.0–0.2)

## 2023-04-04 LAB — COMPREHENSIVE METABOLIC PANEL
ALT: 44 U/L (ref 0–44)
AST: 30 U/L (ref 15–41)
Albumin: 3 g/dL — ABNORMAL LOW (ref 3.5–5.0)
Alkaline Phosphatase: 126 U/L (ref 38–126)
Anion gap: 8 (ref 5–15)
BUN: 21 mg/dL (ref 8–23)
CO2: 23 mmol/L (ref 22–32)
Calcium: 8.4 mg/dL — ABNORMAL LOW (ref 8.9–10.3)
Chloride: 98 mmol/L (ref 98–111)
Creatinine, Ser: 0.74 mg/dL (ref 0.44–1.00)
GFR, Estimated: >60 mL/min (ref >60)
Glucose, Bld: 136 mg/dL — ABNORMAL HIGH (ref 70–99)
Potassium: 3.9 mmol/L (ref 3.5–5.1)
Sodium: 129 mmol/L — ABNORMAL LOW (ref 135–145)
Total Bilirubin: 1.3 mg/dL — ABNORMAL HIGH (ref <1.2)
Total Protein: 6 g/dL — ABNORMAL LOW (ref 6.5–8.1)

## 2023-04-04 LAB — PHOSPHORUS: Phosphorus: 2.5 mg/dL (ref 2.5–4.6)

## 2023-04-04 LAB — MAGNESIUM: Magnesium: 2 mg/dL (ref 1.7–2.4)

## 2023-04-04 LAB — IMMATURE PLATELET FRACTION: Immature Platelet Fraction: 3.9 % (ref 1.2–8.6)

## 2023-04-04 MED ORDER — DEXTROSE-SODIUM CHLORIDE 5-0.45 % IV SOLN: INTRAVENOUS | Status: AC

## 2023-04-04 NOTE — Plan of Care (Signed)
  Problem: Coping: Goal: Level of anxiety will decrease Outcome: Progressing   Problem: Pain Management: Goal: General experience of comfort will improve Outcome: Progressing

## 2023-04-04 NOTE — H&P (View-Only) (Signed)
Reason for Consult: Acute cholecystitis Referring Physician: [facility]  Dimas Chyle HPI: This is a 62 year old female who is well-known to me with a recent diagnosis of pancreatic cancer.  She had an EUS and ERCP with stent placement in July this year.  She pursued a second opinion at [facility] and she had a covered metallic stent placed on 03/30/2023.  The stent was 8 cm in length.  She initially felt pain, but then it resolved.  Unfornately she started to experience more RUQ abdominal pain and she presented to the ER.  The CT scan showed that she had an acute cholecystitis.  Her current liver enzymes were normal, but there was a mild elevation in her TB.  Surgery evaluated the patient and they recommended that GI evaluate the patient as a lap chole is not the best option for her.  Since being started on Zosyn she reports a resolution of her abdominal pain.  Past Medical History:  Diagnosis Date   Arthritis    GERD (gastroesophageal reflux disease)    PONV (postoperative nausea and vomiting)    Prediabetes     Past Surgical History:  Procedure Laterality Date   BILIARY BRUSHING  12/17/2022   Procedure: BILIARY BRUSHING;  Surgeon: Jeani Hawking, MD;  Location: [facility];  Service: Gastroenterology;;   BILIARY STENT PLACEMENT N/A 12/17/2022   Procedure: BILIARY STENT PLACEMENT;  Surgeon: Jeani Hawking, MD;  Location: [facility];  Service: Gastroenterology;  Laterality: N/A;   ERCP N/A 12/17/2022   Procedure: ENDOSCOPIC RETROGRADE CHOLANGIOPANCREATOGRAPHY (ERCP);  Surgeon: Jeani Hawking, MD;  Location: [facility];  Service: Gastroenterology;  Laterality: N/A;   ESOPHAGOGASTRODUODENOSCOPY (EGD) WITH PROPOFOL N/A 12/18/2022   Procedure: ESOPHAGOGASTRODUODENOSCOPY (EGD) WITH PROPOFOL;  Surgeon: Jeani Hawking, MD;  Location: [facility];  Service: Gastroenterology;  Laterality: N/A;   ESOPHAGOGASTRODUODENOSCOPY (EGD) WITH PROPOFOL N/A 12/30/2022   Procedure: ESOPHAGOGASTRODUODENOSCOPY (EGD)  WITH PROPOFOL;  Surgeon: Jeani Hawking, MD;  Location: [facility] ENDOSCOPY;  Service: Gastroenterology;  Laterality: N/A;   EUS N/A 12/18/2022   Procedure: UPPER ENDOSCOPIC ULTRASOUND (EUS) LINEAR;  Surgeon: Jeani Hawking, MD;  Location: [facility];  Service: Gastroenterology;  Laterality: N/A;   FINE NEEDLE ASPIRATION N/A 12/18/2022   Procedure: FINE NEEDLE ASPIRATION (FNA) LINEAR;  Surgeon: Jeani Hawking, MD;  Location: [facility];  Service: Gastroenterology;  Laterality: N/A;   FINE NEEDLE ASPIRATION  12/30/2022   Procedure: FINE NEEDLE ASPIRATION (FNA) LINEAR;  Surgeon: Jeani Hawking, MD;  Location: [facility] ENDOSCOPY;  Service: Gastroenterology;;   LAPAROSCOPIC ABDOMINAL EXPLORATION     SPHINCTEROTOMY  12/17/2022   Procedure: Dennison Mascot;  Surgeon: Jeani Hawking, MD;  Location: [facility];  Service: Gastroenterology;;   UPPER ESOPHAGEAL ENDOSCOPIC ULTRASOUND (EUS) N/A 12/30/2022   Procedure: UPPER ESOPHAGEAL ENDOSCOPIC ULTRASOUND (EUS);  Surgeon: Jeani Hawking, MD;  Location: [facility] ENDOSCOPY;  Service: Gastroenterology;  Laterality: N/A;    Family History  Problem Relation Age of Onset   Diabetes Mother    Stroke Mother    Lung cancer Father 25       smoked   Diabetes Father    Diabetes Sister    Breast cancer Sister 40   Diabetes Brother    Cancer Brother 60       neck cancer   Heart disease Maternal Grandmother    Breast cancer Maternal Grandmother        dx. <50, double mastectomy   Heart disease Maternal Grandfather     Social History:  reports that she has never smoked. She has  never used smokeless tobacco. She reports current alcohol use. She reports that she does not use drugs.  Allergies:  Allergies  Allergen Reactions   Other Nausea Only and Other (See Comments)    Certain types of Anesthesia cause SEVERE NAUSEA   Codeine Nausea Only   Meloxicam Nausea Only   Tramadol Nausea Only    Medications: Scheduled:  Chlorhexidine Gluconate Cloth  6 each Topical Q0600   loratadine   10 mg Oral Daily   sodium chloride flush  10-40 mL Intracatheter Q12H   Continuous:  dextrose 5 % and 0.45 % NaCl 100 mL/hr at 04/04/23 1218   piperacillin-tazobactam (ZOSYN)  IV 3.375 g (04/04/23 1338)    Results for orders placed or performed during the hospital encounter of 04/02/23 (from the past 24 hour(s))  Immature Platelet Fraction     Status: None   Collection Time: 04/04/23  4:25 AM  Result Value Ref Range   Immature Platelet Fraction 3.9 1.2 - 8.6 %  CBC with Differential/Platelet     Status: Abnormal   Collection Time: 04/04/23  4:25 AM  Result Value Ref Range   WBC 6.8 4.0 - 10.5 K/uL   RBC 3.48 (L) 3.87 - 5.11 MIL/uL   Hemoglobin 9.9 (L) 12.0 - 15.0 g/dL   HCT 81.1 (L) 91.4 - 78.2 %   MCV 90.5 80.0 - 100.0 fL   MCH 28.4 26.0 - 34.0 pg   MCHC 31.4 30.0 - 36.0 g/dL   RDW 95.6 21.3 - 08.6 %   Platelets 97 (L) 150 - 400 K/uL   nRBC 0.0 0.0 - 0.2 %   Neutrophils Relative % 87 %   Neutro Abs 5.9 1.7 - 7.7 K/uL   Lymphocytes Relative 4 %   Lymphs Abs 0.3 (L) 0.7 - 4.0 K/uL   Monocytes Relative 8 %   Monocytes Absolute 0.5 0.1 - 1.0 K/uL   Eosinophils Relative 0 %   Eosinophils Absolute 0.0 0.0 - 0.5 K/uL   Basophils Relative 0 %   Basophils Absolute 0.0 0.0 - 0.1 K/uL   Immature Granulocytes 1 %   Abs Immature Granulocytes 0.06 0.00 - 0.07 K/uL  Comprehensive metabolic panel     Status: Abnormal   Collection Time: 04/04/23  4:25 AM  Result Value Ref Range   Sodium 129 (L) 135 - 145 mmol/L   Potassium 3.9 3.5 - 5.1 mmol/L   Chloride 98 98 - 111 mmol/L   CO2 23 22 - 32 mmol/L   Glucose, Bld 136 (H) 70 - 99 mg/dL   BUN 21 8 - 23 mg/dL   Creatinine, Ser 5.78 0.44 - 1.00 mg/dL   Calcium 8.4 (L) 8.9 - 10.3 mg/dL   Total Protein 6.0 (L) 6.5 - 8.1 g/dL   Albumin 3.0 (L) 3.5 - 5.0 g/dL   AST 30 15 - 41 U/L   ALT 44 0 - 44 U/L   Alkaline Phosphatase 126 38 - 126 U/L   Total Bilirubin 1.3 (H) <1.2 mg/dL   GFR, Estimated >46 >96 mL/min   Anion gap 8 5 - 15   Magnesium     Status: None   Collection Time: 04/04/23  4:25 AM  Result Value Ref Range   Magnesium 2.0 1.7 - 2.4 mg/dL  Phosphorus     Status: None   Collection Time: 04/04/23  4:25 AM  Result Value Ref Range   Phosphorus 2.5 2.5 - 4.6 mg/dL     US Abdomen Limited RUQ (LIVER/GB)  Result Date:  04/03/2023 CLINICAL DATA:  Right upper quadrant pain. History of pancreatic cancer. EXAM: ULTRASOUND ABDOMEN LIMITED RIGHT UPPER QUADRANT COMPARISON:  04/02/2023. FINDINGS: Gallbladder: The gallbladder is distended with heterogeneous wall thickening measuring up to 15 mm. No stones are identified. No sonographic Murphy sign noted by sonographer, however patient has received pain meds. Common bile duct: Diameter: 7.1 mm Liver: A hypervascular hypoechoic lesion is noted at the gallbladder fossa measuring 3.0 x 2.8 x 2.0 cm. Within normal limits in parenchymal echogenicity. Portal vein is patent on color Doppler imaging with normal direction of blood flow towards the liver. Other: None. IMPRESSION: 1. Distended gallbladder with complex thickening of the gallbladder wall measuring up to 15 mm. The possibility of cholecystitis can not be excluded. Surgical consultation is recommended. 2. Hypervascular hypoechoic lesion in the liver at the gallbladder fossa measuring 3.0 cm. MRI with contrast is recommended for further evaluation. Electronically Signed   By: Thornell Sartorius M.D.   On: 04/03/2023 00:41   CT ABDOMEN PELVIS W CONTRAST  Result Date: 04/02/2023 CLINICAL DATA:  Abdominal pain, acute, nonlocalized. Right upper quadrant abdominal pain since having procedure done on Wednesday. EXAM: CT ABDOMEN AND PELVIS WITH CONTRAST TECHNIQUE: Multidetector CT imaging of the abdomen and pelvis was performed using the standard protocol following bolus administration of intravenous contrast. RADIATION DOSE REDUCTION: This exam was performed according to the departmental dose-optimization program which includes automated  exposure control, adjustment of the mA and/or kV according to patient size and/or use of iterative reconstruction technique. CONTRAST:  OMNIPAQUE IOHEXOL 300 MG/ML  SOLN COMPARISON:  01/11/2023. FINDINGS: Lower chest: Heart is normal in size and there is a small pericardial effusion. Mild atelectasis is present at the lung bases. Hepatobiliary: No focal abnormality in the liver. Fatty infiltration of the liver is noted. Pneumobilia is noted in the left lobe. The gallbladder is distended. There is cholelithiasis with surrounding inflammatory changes. Fat stranding and a small amount of free fluid are noted at gallbladder fossa, porta hepatis, and perihepatic space. There is intrahepatic biliary ductal dilatation. A common bile duct stent is noted and appears patent. Pancreas: An ill-defined hypodense region is noted in the pancreatic head, compatible with known mass. There is atrophy of the pancreatic body and tail with pancreatic ductal dilatation measuring 5 mm. The mass encases the celiac artery and SMA. There is encasement of the SMV, portal vein and portal confluence with segmental narrowing. No definite evidence of thrombus. Spleen: The spleen is markedly enlarged at 20.9 cm. Adrenals/Urinary Tract: The adrenal glands are within normal limits. The kidneys enhance symmetrically. Hypodensity is noted in the upper pole of the left kidney, likely cyst. No renal calculus or hydronephrosis. The bladder is unremarkable. Stomach/Bowel: There is mild thickening of the walls of the gastric antrum and proximal duodenum adjacent to the pancreatic mass. No bowel obstruction, free air, or pneumatosis. There is mild diffuse colonic wall thickening with pericolonic fat stranding, most pronounced at the ascending colon and hepatic flexure. Appendix appears normal. Vascular/Lymphatic: Aorta is normal in caliber. Prominent lymph nodes are noted at the porta hepatis and gastrohepatic ligament. Reproductive: Uterus and  bilateral adnexa are unremarkable. Other: Small amount of free fluid in the perihepatic space. Musculoskeletal: Degenerative changes are present in the thoracolumbar spine. No acute osseous abnormality. IMPRESSION: 1. Distended gallbladder with cholelithiasis and inflammatory changes at the gallbladder fossa, possible acute cholecystitis. Ultrasound is recommended for further evaluation. 2. Mild intrahepatic biliary ductal dilatation and pneumobilia. Common bile duct stent appears patent. 3. Ill-defined  hypodense pancreatic head mass compatible with known pancreatic adenocarcinoma with encasement of the vasculature, as described above. 4. Thickening of the walls of the gastric antrum and proximal duodenum in the region of the pancreatic mass, possible gastritis/duodenitis. The possibility of infiltrative process can not be excluded. 5. Findings suggestive infectious or inflammatory colitis. 6. Hepatic steatosis. 7. Splenomegaly. Electronically Signed   By: Thornell Sartorius M.D.   On: 04/02/2023 23:09    ROS:  As stated above in the HPI otherwise negative.  Blood pressure 105/63, pulse 88, temperature (!) 97.5 F (36.4 C), temperature source Oral, resp. rate 16, height 5\' 5"  (1.651 m), weight 72.6 kg, last menstrual period 01/11/2011, SpO2 97%.    PE: Gen: NAD, Alert and Oriented HEENT:  [place]/AT, EOMI Neck: Supple, no LAD Lungs: CTA Bilaterally CV: RRR without M/G/R ABD: Soft, NTND, +BS Ext: No C/C/E  Assessment/Plan: 1) Acute cholecystitis. 2) Pancreatic adenocarcinoma. 3) RUQ pain.   Clinically she is well and it appears that she responded to the antibiotics.  Review of the CT scan and her prior MRCP suggests that the current stent occluded the cystic duct.  She will benefit with a repeat ERCP to evaluate the position of the cystic duct and placement of a new stent.  A shorter stent is planned, but it has to be long enough to maintain patency of the CBD.  There is the possibility of placing two  smaller plastic biliary stents to maintain the patency.  Plan: 1) ERCP tomorrow with stent exchange.  Ovie Cornelio D 04/04/2023, 6:45 PM

## 2023-04-04 NOTE — Plan of Care (Signed)

## 2023-04-04 NOTE — Progress Notes (Signed)
PROGRESS NOTE    JANICE SCHIEDEL  YTK:[id number] DOB: [birth year] DOA: 04/02/2023 PCP: Judy Pimple, MD    Brief Narrative:  recently diagnosed with pancreatic adenocarcinoma complicated by biliary stricture status post ERCP and stent placement, repeat ERCP and exchange of metal stent 11/6 presented to the ER with severe abdominal pain and nausea. She was planned to start treatment with Xeloda and radiation next week. In the emergency room she was found to be tachypneic, on room air. Blood pressure adequate. Platelets 86,000 which is chronic. Bilirubin 1.7. CT scan with distended gallbladder with gallstone suspected acute cholecystitis. Admitted with surgical consultation.   Subjective: Patient seen and examined.  Intermittent nausea and using nausea medication.  She still has pain but mostly on the upper back.  Denies any epigastric pain.  Afebrile.  Could not tolerate clear liquid diet yesterday.  She is trying some chips and sips.  Ice cubes.  Called and discussed with gastroenterology, scheduled for ERCP and stent exchange tomorrow.   Assessment & Plan:   Acute cholecystitis with cholelithiasis, recent ERCP and biliary stent exchange.  Stable LFTs. Followed by general surgery, recommended conservative management.  Patient currently not a surgical candidate for gallbladder removal because of surrounding cancer. N.p.o. with ice chips.  IV Zosyn.  Adequate pain medications.  Continue IV fluids. Patient's LFTs are stable and CT scan with no evidence of stent occlusion, however suspect acute infection caused by recent stent manipulation.  GI following.  Dr. Elnoria Howard planning to do ERCP and stent exchange 11/12.  Out of bed.  Pancreatic adenocarcinoma with biliary stricture: Followed by oncology.  Patient was planning to start chemotherapy this week as well as radiation treatment 11/14.  She can likely start radiation but may have to wait for chemotherapy until acute infection is over.  Oncology to  follow-up.  Thrombocytopenia: Mostly chronic.  97 today.  Monitor.  Hypokalemia: Replaced and adequate.  Magnesium is adequate.    DVT prophylaxis: SCDs Start: 04/03/23 0044   Code Status: Full code Family Communication: Husband at the bedside Disposition Plan: Status is: Inpatient Remains inpatient appropriate because: IV antibiotics, IV fluid, inpatient procedures     Consultants:  Gastroenterology Surgery  Procedures:  None  Antimicrobials:  Zosyn 11/9----     Objective: Vitals:   04/03/23 1050 04/03/23 1713 04/03/23 2010 04/04/23 0606  BP: 110/66  127/70 126/78  Pulse: 92  (!) 109 94  Resp: 18  19 17   Temp: 98.2 F (36.8 C) 99.3 F (37.4 C) 98.3 F (36.8 C) 97.6 F (36.4 C)  TempSrc:  Axillary Oral Oral  SpO2: 98%  93% 95%  Weight:      Height:        Intake/Output Summary (Last 24 hours) at 04/04/2023 1126 Last data filed at 04/04/2023 5573 Gross per 24 hour  Intake 2389.77 ml  Output --  Net 2389.77 ml   Filed Weights   04/02/23 1522  Weight: 72.6 kg    Examination:  General exam: Appears calm and comfortable  Slightly anxious. Respiratory system: Clear to auscultation. Respiratory effort normal. Port-A-Cath present on right chest wall. Cardiovascular system: S1 & S2 heard, RRR.  No edema.   Gastrointestinal system: Mildly distended.  Epigastric and right upper quadrant tenderness present without rigidity or guarding.  Bowel sound present. Central nervous system: Alert and oriented. No focal neurological deficits. Extremities: Symmetric 5 x 5 power. Skin: No rashes, lesions or ulcers Psychiatry: Judgement and insight appear normal. Mood & affect appropriate.  Data Reviewed: I have personally reviewed following labs and imaging studies  CBC: Recent Labs  Lab 04/02/23 1536 04/03/23 0355 04/04/23 0425  WBC 6.9 6.8 6.8  NEUTROABS 6.2  --  5.9  HGB 11.2* 9.2* 9.9*  HCT 34.9* 29.0* 31.5*  MCV 89.3 89.8 90.5  PLT 86* 75* 97*    Basic Metabolic Panel: Recent Labs  Lab 04/02/23 1536 04/03/23 0355 04/04/23 0425  NA 132* 131* 129*  K 3.9 3.1* 3.9  CL 100 100 98  CO2 22 20* 23  GLUCOSE 105* 108* 136*  BUN 16 21 21   CREATININE 0.73 0.70 0.74  CALCIUM 8.6* 8.3* 8.4*  MG 1.8  --  2.0  PHOS  --   --  2.5   GFR: Estimated Creatinine Clearance: 73.7 mL/min (by C-G formula based on SCr of 0.74 mg/dL). Liver Function Tests: Recent Labs  Lab 04/02/23 1536 04/03/23 0355 04/04/23 0425  AST 35 30 30  ALT 57* 46* 44  ALKPHOS 137* 116 126  BILITOT 1.7* 1.5* 1.3*  PROT 6.8 6.0* 6.0*  ALBUMIN 3.5 3.0* 3.0*   Recent Labs  Lab 04/02/23 1536  LIPASE 22   No results for input(s): "AMMONIA" in the last 168 hours. Coagulation Profile: Recent Labs  Lab 04/02/23 1536 04/03/23 0355  INR 1.4* 1.7*   Cardiac Enzymes: No results for input(s): "CKTOTAL", "CKMB", "CKMBINDEX", "TROPONINI" in the last 168 hours. BNP (last 3 results) No results for input(s): "PROBNP" in the last 8760 hours. HbA1C: No results for input(s): "HGBA1C" in the last 72 hours. CBG: No results for input(s): "GLUCAP" in the last 168 hours. Lipid Profile: No results for input(s): "CHOL", "HDL", "LDLCALC", "TRIG", "CHOLHDL", "LDLDIRECT" in the last 72 hours. Thyroid Function Tests: No results for input(s): "TSH", "T4TOTAL", "FREET4", "T3FREE", "THYROIDAB" in the last 72 hours. Anemia Panel: No results for input(s): "VITAMINB12", "FOLATE", "FERRITIN", "TIBC", "IRON", "RETICCTPCT" in the last 72 hours. Sepsis Labs: Recent Labs  Lab 04/02/23 1606 04/02/23 2213  LATICACIDVEN 1.1 0.8    Recent Results (from the past 240 hour(s))  Blood culture (routine x 2)     Status: None (Preliminary result)   Collection Time: 04/02/23  4:00 PM   Specimen: BLOOD  Result Value Ref Range Status   Specimen Description   Final    BLOOD RIGHT ANTECUBITAL Performed at [facility], [address], [place], [place] [address]     Special Requests   Final    BOTTLES DRAWN AEROBIC AND ANAEROBIC Blood Culture adequate volume Performed at [facility], [address], [place], [place] [address]    Culture   Final    NO GROWTH 2 DAYS Performed at [facility], [address] [place], [place] [address]    Report Status PENDING  Incomplete  Blood culture (routine x 2)     Status: None (Preliminary result)   Collection Time: 04/02/23  9:59 PM   Specimen: BLOOD  Result Value Ref Range Status   Specimen Description   Final    BLOOD PORTA CATH Performed at [facility], [address], [place], [place] [address]    Special Requests   Final    BOTTLES DRAWN AEROBIC AND ANAEROBIC Blood Culture adequate volume Performed at [facility], [address] [place], [place] [address]    Culture   Final    NO GROWTH 1 DAY Performed at [facility], [address] [place], [place] [address]  Report Status PENDING  Incomplete         Radiology Studies: US Abdomen Limited RUQ (LIVER/GB)  Result Date: 04/03/2023 CLINICAL DATA:  Right upper quadrant pain. History of pancreatic cancer. EXAM: ULTRASOUND ABDOMEN LIMITED RIGHT UPPER QUADRANT COMPARISON:  04/02/2023. FINDINGS: Gallbladder: The gallbladder is distended with heterogeneous wall thickening measuring up to 15 mm. No stones are identified. No sonographic Murphy sign noted by sonographer, however patient has received pain meds. Common bile duct: Diameter: 7.1 mm Liver: A hypervascular hypoechoic lesion is noted at the gallbladder fossa measuring 3.0 x 2.8 x 2.0 cm. Within normal limits in parenchymal echogenicity. Portal vein is patent on color Doppler imaging with normal direction of blood flow towards the liver. Other: None. IMPRESSION: 1. Distended gallbladder with complex thickening of the gallbladder wall measuring up to 15 mm. The possibility of cholecystitis can not be excluded. Surgical consultation is  recommended. 2. Hypervascular hypoechoic lesion in the liver at the gallbladder fossa measuring 3.0 cm. MRI with contrast is recommended for further evaluation. Electronically Signed   By: Thornell Sartorius M.D.   On: 04/03/2023 00:41   CT ABDOMEN PELVIS W CONTRAST  Result Date: 04/02/2023 CLINICAL DATA:  Abdominal pain, acute, nonlocalized. Right upper quadrant abdominal pain since having procedure done on Wednesday. EXAM: CT ABDOMEN AND PELVIS WITH CONTRAST TECHNIQUE: Multidetector CT imaging of the abdomen and pelvis was performed using the standard protocol following bolus administration of intravenous contrast. RADIATION DOSE REDUCTION: This exam was performed according to the departmental dose-optimization program which includes automated exposure control, adjustment of the mA and/or kV according to patient size and/or use of iterative reconstruction technique. CONTRAST:  OMNIPAQUE IOHEXOL 300 MG/ML  SOLN COMPARISON:  01/11/2023. FINDINGS: Lower chest: Heart is normal in size and there is a small pericardial effusion. Mild atelectasis is present at the lung bases. Hepatobiliary: No focal abnormality in the liver. Fatty infiltration of the liver is noted. Pneumobilia is noted in the left lobe. The gallbladder is distended. There is cholelithiasis with surrounding inflammatory changes. Fat stranding and a small amount of free fluid are noted at gallbladder fossa, porta hepatis, and perihepatic space. There is intrahepatic biliary ductal dilatation. A common bile duct stent is noted and appears patent. Pancreas: An ill-defined hypodense region is noted in the pancreatic head, compatible with known mass. There is atrophy of the pancreatic body and tail with pancreatic ductal dilatation measuring 5 mm. The mass encases the celiac artery and SMA. There is encasement of the SMV, portal vein and portal confluence with segmental narrowing. No definite evidence of thrombus. Spleen: The spleen is markedly enlarged  at 20.9 cm. Adrenals/Urinary Tract: The adrenal glands are within normal limits. The kidneys enhance symmetrically. Hypodensity is noted in the upper pole of the left kidney, likely cyst. No renal calculus or hydronephrosis. The bladder is unremarkable. Stomach/Bowel: There is mild thickening of the walls of the gastric antrum and proximal duodenum adjacent to the pancreatic mass. No bowel obstruction, free air, or pneumatosis. There is mild diffuse colonic wall thickening with pericolonic fat stranding, most pronounced at the ascending colon and hepatic flexure. Appendix appears normal. Vascular/Lymphatic: Aorta is normal in caliber. Prominent lymph nodes are noted at the porta hepatis and gastrohepatic ligament. Reproductive: Uterus and bilateral adnexa are unremarkable. Other: Small amount of free fluid in the perihepatic space. Musculoskeletal: Degenerative changes are present in the thoracolumbar spine. No acute osseous abnormality. IMPRESSION: 1. Distended gallbladder with cholelithiasis and inflammatory changes at the gallbladder fossa, possible acute  cholecystitis. Ultrasound is recommended for further evaluation. 2. Mild intrahepatic biliary ductal dilatation and pneumobilia. Common bile duct stent appears patent. 3. Ill-defined hypodense pancreatic head mass compatible with known pancreatic adenocarcinoma with encasement of the vasculature, as described above. 4. Thickening of the walls of the gastric antrum and proximal duodenum in the region of the pancreatic mass, possible gastritis/duodenitis. The possibility of infiltrative process can not be excluded. 5. Findings suggestive infectious or inflammatory colitis. 6. Hepatic steatosis. 7. Splenomegaly. Electronically Signed   By: Thornell Sartorius M.D.   On: 04/02/2023 23:09        Scheduled Meds:  Chlorhexidine Gluconate Cloth  6 each Topical Q0600   loratadine  10 mg Oral Daily   sodium chloride flush  10-40 mL Intracatheter Q12H   Continuous  Infusions:  dextrose 5 % and 0.45 % NaCl     lactated ringers 100 mL/hr at 04/04/23 0831   piperacillin-tazobactam (ZOSYN)  IV 3.375 g (04/04/23 0453)     LOS: 1 day    Time spent: 40 minutes    Dorcas Carrow, MD [facility]

## 2023-04-04 NOTE — Consult Note (Signed)
Reason for Consult: Acute cholecystitis Referring Physician: [facility]  Dimas Chyle HPI: This is a 62 year old female who is well-known to me with a recent diagnosis of pancreatic cancer.  She had an EUS and ERCP with stent placement in July this year.  She pursued a second opinion at [facility] and she had a covered metallic stent placed on 03/30/2023.  The stent was 8 cm in length.  She initially felt pain, but then it resolved.  Unfornately she started to experience more RUQ abdominal pain and she presented to the ER.  The CT scan showed that she had an acute cholecystitis.  Her current liver enzymes were normal, but there was a mild elevation in her TB.  Surgery evaluated the patient and they recommended that GI evaluate the patient as a lap chole is not the best option for her.  Since being started on Zosyn she reports a resolution of her abdominal pain.  Past Medical History:  Diagnosis Date   Arthritis    GERD (gastroesophageal reflux disease)    PONV (postoperative nausea and vomiting)    Prediabetes     Past Surgical History:  Procedure Laterality Date   BILIARY BRUSHING  12/17/2022   Procedure: BILIARY BRUSHING;  Surgeon: Jeani Hawking, MD;  Location: [facility];  Service: Gastroenterology;;   BILIARY STENT PLACEMENT N/A 12/17/2022   Procedure: BILIARY STENT PLACEMENT;  Surgeon: Jeani Hawking, MD;  Location: [facility]OSCOPY;  Service: Gastroenterology;  Laterality: N/A;   ERCP N/A 12/17/2022   Procedure: ENDOSCOPIC RETROGRADE CHOLANGIOPANCREATOGRAPHY (ERCP);  Surgeon: Jeani Hawking, MD;  Location: [facility];  Service: Gastroenterology;  Laterality: N/A;   ESOPHAGOGASTRODUODENOSCOPY (EGD) WITH PROPOFOL N/A 12/18/2022   Procedure: ESOPHAGOGASTRODUODENOSCOPY (EGD) WITH PROPOFOL;  Surgeon: Jeani Hawking, MD;  Location: [facility];  Service: Gastroenterology;  Laterality: N/A;   ESOPHAGOGASTRODUODENOSCOPY (EGD) WITH PROPOFOL N/A 12/30/2022   Procedure: ESOPHAGOGASTRODUODENOSCOPY (EGD)  WITH PROPOFOL;  Surgeon: Jeani Hawking, MD;  Location: [facility] ENDOSCOPY;  Service: Gastroenterology;  Laterality: N/A;   EUS N/A 12/18/2022   Procedure: UPPER ENDOSCOPIC ULTRASOUND (EUS) LINEAR;  Surgeon: Jeani Hawking, MD;  Location: [facility]OSCOPY;  Service: Gastroenterology;  Laterality: N/A;   FINE NEEDLE ASPIRATION N/A 12/18/2022   Procedure: FINE NEEDLE ASPIRATION (FNA) LINEAR;  Surgeon: Jeani Hawking, MD;  Location: [facility];  Service: Gastroenterology;  Laterality: N/A;   FINE NEEDLE ASPIRATION  12/30/2022   Procedure: FINE NEEDLE ASPIRATION (FNA) LINEAR;  Surgeon: Jeani Hawking, MD;  Location: [facility] ENDOSCOPY;  Service: Gastroenterology;;   LAPAROSCOPIC ABDOMINAL EXPLORATION     SPHINCTEROTOMY  12/17/2022   Procedure: Dennison Mascot;  Surgeon: Jeani Hawking, MD;  Location: [facility];  Service: Gastroenterology;;   UPPER ESOPHAGEAL ENDOSCOPIC ULTRASOUND (EUS) N/A 12/30/2022   Procedure: UPPER ESOPHAGEAL ENDOSCOPIC ULTRASOUND (EUS);  Surgeon: Jeani Hawking, MD;  Location: [facility] ENDOSCOPY;  Service: Gastroenterology;  Laterality: N/A;    Family History  Problem Relation Age of Onset   Diabetes Mother    Stroke Mother    Lung cancer Father 25       smoked   Diabetes Father    Diabetes Sister    Breast cancer Sister 40   Diabetes Brother    Cancer Brother 60       neck cancer   Heart disease Maternal Grandmother    Breast cancer Maternal Grandmother        dx. <50, double mastectomy   Heart disease Maternal Grandfather     Social History:  reports that she has never smoked. She has  never used smokeless tobacco. She reports current alcohol use. She reports that she does not use drugs.  Allergies:  Allergies  Allergen Reactions   Other Nausea Only and Other (See Comments)    Certain types of Anesthesia cause SEVERE NAUSEA   Codeine Nausea Only   Meloxicam Nausea Only   Tramadol Nausea Only    Medications: Scheduled:  Chlorhexidine Gluconate Cloth  6 each Topical Q0600   loratadine   10 mg Oral Daily   sodium chloride flush  10-40 mL Intracatheter Q12H   Continuous:  dextrose 5 % and 0.45 % NaCl 100 mL/hr at 04/04/23 1218   piperacillin-tazobactam (ZOSYN)  IV 3.375 g (04/04/23 1338)    Results for orders placed or performed during the hospital encounter of 04/02/23 (from the past 24 hour(s))  Immature Platelet Fraction     Status: None   Collection Time: 04/04/23  4:25 AM  Result Value Ref Range   Immature Platelet Fraction 3.9 1.2 - 8.6 %  CBC with Differential/Platelet     Status: Abnormal   Collection Time: 04/04/23  4:25 AM  Result Value Ref Range   WBC 6.8 4.0 - 10.5 K/uL   RBC 3.48 (L) 3.87 - 5.11 MIL/uL   Hemoglobin 9.9 (L) 12.0 - 15.0 g/dL   HCT 81.1 (L) 91.4 - 78.2 %   MCV 90.5 80.0 - 100.0 fL   MCH 28.4 26.0 - 34.0 pg   MCHC 31.4 30.0 - 36.0 g/dL   RDW 95.6 21.3 - 08.6 %   Platelets 97 (L) 150 - 400 K/uL   nRBC 0.0 0.0 - 0.2 %   Neutrophils Relative % 87 %   Neutro Abs 5.9 1.7 - 7.7 K/uL   Lymphocytes Relative 4 %   Lymphs Abs 0.3 (L) 0.7 - 4.0 K/uL   Monocytes Relative 8 %   Monocytes Absolute 0.5 0.1 - 1.0 K/uL   Eosinophils Relative 0 %   Eosinophils Absolute 0.0 0.0 - 0.5 K/uL   Basophils Relative 0 %   Basophils Absolute 0.0 0.0 - 0.1 K/uL   Immature Granulocytes 1 %   Abs Immature Granulocytes 0.06 0.00 - 0.07 K/uL  Comprehensive metabolic panel     Status: Abnormal   Collection Time: 04/04/23  4:25 AM  Result Value Ref Range   Sodium 129 (L) 135 - 145 mmol/L   Potassium 3.9 3.5 - 5.1 mmol/L   Chloride 98 98 - 111 mmol/L   CO2 23 22 - 32 mmol/L   Glucose, Bld 136 (H) 70 - 99 mg/dL   BUN 21 8 - 23 mg/dL   Creatinine, Ser 5.78 0.44 - 1.00 mg/dL   Calcium 8.4 (L) 8.9 - 10.3 mg/dL   Total Protein 6.0 (L) 6.5 - 8.1 g/dL   Albumin 3.0 (L) 3.5 - 5.0 g/dL   AST 30 15 - 41 U/L   ALT 44 0 - 44 U/L   Alkaline Phosphatase 126 38 - 126 U/L   Total Bilirubin 1.3 (H) <1.2 mg/dL   GFR, Estimated >46 >96 mL/min   Anion gap 8 5 - 15   Magnesium     Status: None   Collection Time: 04/04/23  4:25 AM  Result Value Ref Range   Magnesium 2.0 1.7 - 2.4 mg/dL  Phosphorus     Status: None   Collection Time: 04/04/23  4:25 AM  Result Value Ref Range   Phosphorus 2.5 2.5 - 4.6 mg/dL     US Abdomen Limited RUQ (LIVER/GB)  Result Date:  04/03/2023 CLINICAL DATA:  Right upper quadrant pain. History of pancreatic cancer. EXAM: ULTRASOUND ABDOMEN LIMITED RIGHT UPPER QUADRANT COMPARISON:  04/02/2023. FINDINGS: Gallbladder: The gallbladder is distended with heterogeneous wall thickening measuring up to 15 mm. No stones are identified. No sonographic Murphy sign noted by sonographer, however patient has received pain meds. Common bile duct: Diameter: 7.1 mm Liver: A hypervascular hypoechoic lesion is noted at the gallbladder fossa measuring 3.0 x 2.8 x 2.0 cm. Within normal limits in parenchymal echogenicity. Portal vein is patent on color Doppler imaging with normal direction of blood flow towards the liver. Other: None. IMPRESSION: 1. Distended gallbladder with complex thickening of the gallbladder wall measuring up to 15 mm. The possibility of cholecystitis can not be excluded. Surgical consultation is recommended. 2. Hypervascular hypoechoic lesion in the liver at the gallbladder fossa measuring 3.0 cm. MRI with contrast is recommended for further evaluation. Electronically Signed   By: Thornell Sartorius M.D.   On: 04/03/2023 00:41   CT ABDOMEN PELVIS W CONTRAST  Result Date: 04/02/2023 CLINICAL DATA:  Abdominal pain, acute, nonlocalized. Right upper quadrant abdominal pain since having procedure done on Wednesday. EXAM: CT ABDOMEN AND PELVIS WITH CONTRAST TECHNIQUE: Multidetector CT imaging of the abdomen and pelvis was performed using the standard protocol following bolus administration of intravenous contrast. RADIATION DOSE REDUCTION: This exam was performed according to the departmental dose-optimization program which includes automated  exposure control, adjustment of the mA and/or kV according to patient size and/or use of iterative reconstruction technique. CONTRAST:  OMNIPAQUE IOHEXOL 300 MG/ML  SOLN COMPARISON:  01/11/2023. FINDINGS: Lower chest: Heart is normal in size and there is a small pericardial effusion. Mild atelectasis is present at the lung bases. Hepatobiliary: No focal abnormality in the liver. Fatty infiltration of the liver is noted. Pneumobilia is noted in the left lobe. The gallbladder is distended. There is cholelithiasis with surrounding inflammatory changes. Fat stranding and a small amount of free fluid are noted at gallbladder fossa, porta hepatis, and perihepatic space. There is intrahepatic biliary ductal dilatation. A common bile duct stent is noted and appears patent. Pancreas: An ill-defined hypodense region is noted in the pancreatic head, compatible with known mass. There is atrophy of the pancreatic body and tail with pancreatic ductal dilatation measuring 5 mm. The mass encases the celiac artery and SMA. There is encasement of the SMV, portal vein and portal confluence with segmental narrowing. No definite evidence of thrombus. Spleen: The spleen is markedly enlarged at 20.9 cm. Adrenals/Urinary Tract: The adrenal glands are within normal limits. The kidneys enhance symmetrically. Hypodensity is noted in the upper pole of the left kidney, likely cyst. No renal calculus or hydronephrosis. The bladder is unremarkable. Stomach/Bowel: There is mild thickening of the walls of the gastric antrum and proximal duodenum adjacent to the pancreatic mass. No bowel obstruction, free air, or pneumatosis. There is mild diffuse colonic wall thickening with pericolonic fat stranding, most pronounced at the ascending colon and hepatic flexure. Appendix appears normal. Vascular/Lymphatic: Aorta is normal in caliber. Prominent lymph nodes are noted at the porta hepatis and gastrohepatic ligament. Reproductive: Uterus and  bilateral adnexa are unremarkable. Other: Small amount of free fluid in the perihepatic space. Musculoskeletal: Degenerative changes are present in the thoracolumbar spine. No acute osseous abnormality. IMPRESSION: 1. Distended gallbladder with cholelithiasis and inflammatory changes at the gallbladder fossa, possible acute cholecystitis. Ultrasound is recommended for further evaluation. 2. Mild intrahepatic biliary ductal dilatation and pneumobilia. Common bile duct stent appears patent. 3. Ill-defined  hypodense pancreatic head mass compatible with known pancreatic adenocarcinoma with encasement of the vasculature, as described above. 4. Thickening of the walls of the gastric antrum and proximal duodenum in the region of the pancreatic mass, possible gastritis/duodenitis. The possibility of infiltrative process can not be excluded. 5. Findings suggestive infectious or inflammatory colitis. 6. Hepatic steatosis. 7. Splenomegaly. Electronically Signed   By: Thornell Sartorius M.D.   On: 04/02/2023 23:09    ROS:  As stated above in the HPI otherwise negative.  Blood pressure 105/63, pulse 88, temperature (!) 97.5 F (36.4 C), temperature source Oral, resp. rate 16, height 5\' 5"  (1.651 m), weight 72.6 kg, last menstrual period 01/11/2011, SpO2 97%.    PE: Gen: NAD, Alert and Oriented HEENT:  [place]/AT, EOMI Neck: Supple, no LAD Lungs: CTA Bilaterally CV: RRR without M/G/R ABD: Soft, NTND, +BS Ext: No C/C/E  Assessment/Plan: 1) Acute cholecystitis. 2) Pancreatic adenocarcinoma. 3) RUQ pain.   Clinically she is well and it appears that she responded to the antibiotics.  Review of the CT scan and her prior MRCP suggests that the current stent occluded the cystic duct.  She will benefit with a repeat ERCP to evaluate the position of the cystic duct and placement of a new stent.  A shorter stent is planned, but it has to be long enough to maintain patency of the CBD.  There is the possibility of placing two  smaller plastic biliary stents to maintain the patency.  Plan: 1) ERCP tomorrow with stent exchange.  Ovie Cornelio D 04/04/2023, 6:45 PM

## 2023-04-04 NOTE — Progress Notes (Signed)
Progress Note     Subjective: Pt reports abdominal pain is currently resolved. Patient having more back pain from hospital bed. GI planning to try to reposition stent.   Objective: Vital signs in last 24 hours: Temp:  [97.5 F (36.4 C)-99.3 F (37.4 C)] 97.5 F (36.4 C) (11/11 1246) Pulse Rate:  [88-109] 88 (11/11 1246) Resp:  [16-19] 16 (11/11 1246) BP: (105-127)/(63-78) 105/63 (11/11 1246) SpO2:  [93 %-97 %] 97 % (11/11 1246) Last BM Date : 04/02/23  Intake/Output from previous day: 11/10 0701 - 11/11 0700 In: 2939.4 [P.O.:390; I.V.:2385.8; IV Piggyback:163.6] Out: -  Intake/Output this shift: Total I/O In: 603.1 [P.O.:60; I.V.:543.1] Out: -   PE: General: pleasant, WD, WN female,NAD HEENT: sclera anicteric Lungs:   Respiratory effort nonlabored Abd: soft, NT, ND Psych: A&Ox3 with an appropriate affect.    Lab Results:  Recent Labs    04/03/23 0355 04/04/23 0425  WBC 6.8 6.8  HGB 9.2* 9.9*  HCT 29.0* 31.5*  PLT 75* 97*   BMET Recent Labs    04/03/23 0355 04/04/23 0425  NA 131* 129*  K 3.1* 3.9  CL 100 98  CO2 20* 23  GLUCOSE 108* 136*  BUN 21 21  CREATININE 0.70 0.74  CALCIUM 8.3* 8.4*   PT/INR Recent Labs    04/02/23 1536 04/03/23 0355  LABPROT 17.6* 19.8*  INR 1.4* 1.7*   CMP     Component Value Date/Time   NA 129 (L) 04/04/2023 0425   K 3.9 04/04/2023 0425   CL 98 04/04/2023 0425   CO2 23 04/04/2023 0425   GLUCOSE 136 (H) 04/04/2023 0425   BUN 21 04/04/2023 0425   CREATININE 0.74 04/04/2023 0425   CREATININE 0.74 01/14/2023 0943   CALCIUM 8.4 (L) 04/04/2023 0425   PROT 6.0 (L) 04/04/2023 0425   ALBUMIN 3.0 (L) 04/04/2023 0425   AST 30 04/04/2023 0425   AST 25 01/14/2023 0943   ALT 44 04/04/2023 0425   ALT 29 01/14/2023 0943   ALKPHOS 126 04/04/2023 0425   BILITOT 1.3 (H) 04/04/2023 0425   BILITOT 1.1 01/14/2023 0943   GFRNONAA >60 04/04/2023 0425   GFRNONAA >60 01/14/2023 0943   Lipase     Component Value Date/Time    LIPASE 22 04/02/2023 1536       Studies/Results: US Abdomen Limited RUQ (LIVER/GB)  Result Date: 04/03/2023 CLINICAL DATA:  Right upper quadrant pain. History of pancreatic cancer. EXAM: ULTRASOUND ABDOMEN LIMITED RIGHT UPPER QUADRANT COMPARISON:  04/02/2023. FINDINGS: Gallbladder: The gallbladder is distended with heterogeneous wall thickening measuring up to 15 mm. No stones are identified. No sonographic Murphy sign noted by sonographer, however patient has received pain meds. Common bile duct: Diameter: 7.1 mm Liver: A hypervascular hypoechoic lesion is noted at the gallbladder fossa measuring 3.0 x 2.8 x 2.0 cm. Within normal limits in parenchymal echogenicity. Portal vein is patent on color Doppler imaging with normal direction of blood flow towards the liver. Other: None. IMPRESSION: 1. Distended gallbladder with complex thickening of the gallbladder wall measuring up to 15 mm. The possibility of cholecystitis can not be excluded. Surgical consultation is recommended. 2. Hypervascular hypoechoic lesion in the liver at the gallbladder fossa measuring 3.0 cm. MRI with contrast is recommended for further evaluation. Electronically Signed   By: Thornell Sartorius M.D.   On: 04/03/2023 00:41   CT ABDOMEN PELVIS W CONTRAST  Result Date: 04/02/2023 CLINICAL DATA:  Abdominal pain, acute, nonlocalized. Right upper quadrant abdominal pain since having procedure done  on Wednesday. EXAM: CT ABDOMEN AND PELVIS WITH CONTRAST TECHNIQUE: Multidetector CT imaging of the abdomen and pelvis was performed using the standard protocol following bolus administration of intravenous contrast. RADIATION DOSE REDUCTION: This exam was performed according to the departmental dose-optimization program which includes automated exposure control, adjustment of the mA and/or kV according to patient size and/or use of iterative reconstruction technique. CONTRAST:  OMNIPAQUE IOHEXOL 300 MG/ML  SOLN COMPARISON:  01/11/2023.  FINDINGS: Lower chest: Heart is normal in size and there is a small pericardial effusion. Mild atelectasis is present at the lung bases. Hepatobiliary: No focal abnormality in the liver. Fatty infiltration of the liver is noted. Pneumobilia is noted in the left lobe. The gallbladder is distended. There is cholelithiasis with surrounding inflammatory changes. Fat stranding and a small amount of free fluid are noted at gallbladder fossa, porta hepatis, and perihepatic space. There is intrahepatic biliary ductal dilatation. A common bile duct stent is noted and appears patent. Pancreas: An ill-defined hypodense region is noted in the pancreatic head, compatible with known mass. There is atrophy of the pancreatic body and tail with pancreatic ductal dilatation measuring 5 mm. The mass encases the celiac artery and SMA. There is encasement of the SMV, portal vein and portal confluence with segmental narrowing. No definite evidence of thrombus. Spleen: The spleen is markedly enlarged at 20.9 cm. Adrenals/Urinary Tract: The adrenal glands are within normal limits. The kidneys enhance symmetrically. Hypodensity is noted in the upper pole of the left kidney, likely cyst. No renal calculus or hydronephrosis. The bladder is unremarkable. Stomach/Bowel: There is mild thickening of the walls of the gastric antrum and proximal duodenum adjacent to the pancreatic mass. No bowel obstruction, free air, or pneumatosis. There is mild diffuse colonic wall thickening with pericolonic fat stranding, most pronounced at the ascending colon and hepatic flexure. Appendix appears normal. Vascular/Lymphatic: Aorta is normal in caliber. Prominent lymph nodes are noted at the porta hepatis and gastrohepatic ligament. Reproductive: Uterus and bilateral adnexa are unremarkable. Other: Small amount of free fluid in the perihepatic space. Musculoskeletal: Degenerative changes are present in the thoracolumbar spine. No acute osseous abnormality.  IMPRESSION: 1. Distended gallbladder with cholelithiasis and inflammatory changes at the gallbladder fossa, possible acute cholecystitis. Ultrasound is recommended for further evaluation. 2. Mild intrahepatic biliary ductal dilatation and pneumobilia. Common bile duct stent appears patent. 3. Ill-defined hypodense pancreatic head mass compatible with known pancreatic adenocarcinoma with encasement of the vasculature, as described above. 4. Thickening of the walls of the gastric antrum and proximal duodenum in the region of the pancreatic mass, possible gastritis/duodenitis. The possibility of infiltrative process can not be excluded. 5. Findings suggestive infectious or inflammatory colitis. 6. Hepatic steatosis. 7. Splenomegaly. Electronically Signed   By: Thornell Sartorius M.D.   On: 04/02/2023 23:09    Anti-infectives: Anti-infectives (From admission, onward)    Start     Dose/Rate Route Frequency Ordered Stop   04/03/23 0600  piperacillin-tazobactam (ZOSYN) IVPB 3.375 g        3.375 g 12.5 mL/hr over 240 Minutes Intravenous Every 8 hours 04/03/23 0052     04/02/23 2345  piperacillin-tazobactam (ZOSYN) IVPB 3.375 g        3.375 g 100 mL/hr over 30 Minutes Intravenous  Once 04/02/23 2335 04/03/23 0009        Assessment/Plan  Pancreatic cancer, not surgically resectable S/P pancreatic stent placement at [facility] 11/6 with plans to start chemo/rad this week  Possible cholecystitis  - GI planning to exchange  stent and currently symptoms of abdominal pain are improved - if persistent pain or leukocytosis after stent exchange would recommend HIDA to confirm cholecystitis  - if concern for cholecystitis clinically or based on a HIDA scan would recommend IR consult for percutaneous cholecystostomy tube - patient is not a candidate for cholecystectomy, surgery will follow peripherally   FEN: NPO VTE: none ID: [email or web address]   LOS: 1 day   I reviewed hospitalist notes, last 24 h vitals and pain scores, last  48 h intake and output, last 24 h labs and trends, and last 24 h imaging results.   Juliet Rude, [facility] Surgery 04/04/2023, 1:19 PM Please see Amion for pager number during day hours 7:00am-4:30pm

## 2023-04-04 NOTE — Progress Notes (Signed)
Nicole Johnson   DOB:22-Mar-1961   ZO#:[id number]   JWJ#:[id number]  Medical oncology follow up   Subjective: Patient is known to me, I previously seen her in August 2024 when she was diagnosed with unresectable pancreatic cancer.  She decided to get her cancer care at [facility], and received chemotherapy under Dr. Wendie Chess care. She started FOLFOX with GCSF. Due to cytopenias, plan changed to radiation with Xeloda.  Per patient, she received 2 radiation treatments, but was held due to cytopenia.  She was referred back to me by Dr. Johny Blamer for her thrombocytopenia treatment so she can start radiation again.  She was admitted to the hospital a few days ago for acute cholecystitis.  Her abdominal pain has improved with IV Zosyn, general surgery was consulted and cholecystotomy was not recommended due to her underlying malignancy.  She is scheduled to have ERCP and stent exchange by Dr. Elnoria Howard tomorrow.   Objective:  Vitals:   04/04/23 1246 04/04/23 1927  BP: 105/63 124/69  Pulse: 88 93  Resp: 16 20  Temp: (!) 97.5 F (36.4 C)   SpO2: 97% 98%    Body mass index is 26.63 kg/m.  Intake/Output Summary (Last 24 hours) at 04/04/2023 2233 Last data filed at 04/04/2023 1532 Gross per 24 hour  Intake 1902.62 ml  Output --  Net 1902.62 ml     Sclerae unicteric  Oropharynx clear  No peripheral adenopathy  MSK no focal spinal tenderness, no peripheral edema  Neuro nonfocal    CBG (last 3)  No results for input(s): "GLUCAP" in the last 72 hours.   Labs:  Urine Studies No results for input(s): "UHGB", "CRYS" in the last 72 hours.  Invalid input(s): "UACOL", "UAPR", "USPG", "UPH", "UTP", "UGL", "UKET", "UBIL", "UNIT", "UROB", "ULEU", "UEPI", "UWBC", "URBC", "UBAC", "CAST", "UCOM", "BILUA"  Basic Metabolic Panel: Recent Labs  Lab 04/02/23 1536 04/03/23 0355 04/04/23 0425  NA 132* 131* 129*  K 3.9 3.1* 3.9  CL 100 100 98  CO2 22 20* 23  GLUCOSE 105* 108* 136*  BUN 16 21 21   CREATININE 0.73 0.70  0.74  CALCIUM 8.6* 8.3* 8.4*  MG 1.8  --  2.0  PHOS  --   --  2.5   GFR Estimated Creatinine Clearance: 73.7 mL/min (by C-G formula based on SCr of 0.74 mg/dL). Liver Function Tests: Recent Labs  Lab 04/02/23 1536 04/03/23 0355 04/04/23 0425  AST 35 30 30  ALT 57* 46* 44  ALKPHOS 137* 116 126  BILITOT 1.7* 1.5* 1.3*  PROT 6.8 6.0* 6.0*  ALBUMIN 3.5 3.0* 3.0*   Recent Labs  Lab 04/02/23 1536  LIPASE 22   No results for input(s): "AMMONIA" in the last 168 hours. Coagulation profile Recent Labs  Lab 04/02/23 1536 04/03/23 0355  INR 1.4* 1.7*    CBC: Recent Labs  Lab 04/02/23 1536 04/03/23 0355 04/04/23 0425  WBC 6.9 6.8 6.8  NEUTROABS 6.2  --  5.9  HGB 11.2* 9.2* 9.9*  HCT 34.9* 29.0* 31.5*  MCV 89.3 89.8 90.5  PLT 86* 75* 97*   Cardiac Enzymes: No results for input(s): "CKTOTAL", "CKMB", "CKMBINDEX", "TROPONINI" in the last 168 hours. BNP: Invalid input(s): "POCBNP" CBG: No results for input(s): "GLUCAP" in the last 168 hours. D-Dimer No results for input(s): "DDIMER" in the last 72 hours. Hgb A1c No results for input(s): "HGBA1C" in the last 72 hours. Lipid Profile No results for input(s): "CHOL", "HDL", "LDLCALC", "TRIG", "CHOLHDL", "LDLDIRECT" in the last 72 hours. Thyroid function studies No  results for input(s): "TSH", "T4TOTAL", "T3FREE", "THYROIDAB" in the last 72 hours.  Invalid input(s): "FREET3" Anemia work up No results for input(s): "VITAMINB12", "FOLATE", "FERRITIN", "TIBC", "IRON", "RETICCTPCT" in the last 72 hours. Microbiology Recent Results (from the past 240 hour(s))  Blood culture (routine x 2)     Status: None (Preliminary result)   Collection Time: 04/02/23  4:00 PM   Specimen: BLOOD  Result Value Ref Range Status   Specimen Description   Final    BLOOD RIGHT ANTECUBITAL Performed at [facility], [address] [place], [place] [address]    Special Requests   Final    BOTTLES DRAWN AEROBIC AND  ANAEROBIC Blood Culture adequate volume Performed at [facility], [address] [place], [place] [address]    Culture   Final    NO GROWTH 2 DAYS Performed at [facility], [address], [place], [place] [address]    Report Status PENDING  Incomplete  Blood culture (routine x 2)     Status: None (Preliminary result)   Collection Time: 04/02/23  9:59 PM   Specimen: BLOOD  Result Value Ref Range Status   Specimen Description   Final    BLOOD PORTA CATH Performed at [facility], [address] [place], [place] [address]    Special Requests   Final    BOTTLES DRAWN AEROBIC AND ANAEROBIC Blood Culture adequate volume Performed at [facility], [address], [place], [place] [address]    Culture   Final    NO GROWTH 1 DAY Performed at [facility], [address] [place], [place] [address]    Report Status PENDING  Incomplete      Studies:  US Abdomen Limited RUQ (LIVER/GB)  Result Date: 04/03/2023 CLINICAL DATA:  Right upper quadrant pain. History of pancreatic cancer. EXAM: ULTRASOUND ABDOMEN LIMITED RIGHT UPPER QUADRANT COMPARISON:  04/02/2023. FINDINGS: Gallbladder: The gallbladder is distended with heterogeneous wall thickening measuring up to 15 mm. No stones are identified. No sonographic Murphy sign noted by sonographer, however patient has received pain meds. Common bile duct: Diameter: 7.1 mm Liver: A hypervascular hypoechoic lesion is noted at the gallbladder fossa measuring 3.0 x 2.8 x 2.0 cm. Within normal limits in parenchymal echogenicity. Portal vein is patent on color Doppler imaging with normal direction of blood flow towards the liver. Other: None. IMPRESSION: 1. Distended gallbladder with complex thickening of the gallbladder wall measuring up to 15 mm. The possibility of cholecystitis can not be excluded. Surgical consultation is recommended. 2. Hypervascular hypoechoic lesion in the liver at the  gallbladder fossa measuring 3.0 cm. MRI with contrast is recommended for further evaluation. Electronically Signed   By: Thornell Sartorius M.D.   On: 04/03/2023 00:41    Assessment: 62 y.o. female   Acute cholecystitis with cholelithiasis Pancreatic adenocarcinoma with biliary stricture, status post ERCP and stent placement Thrombocytopenia likely secondary to chemotherapy Hypokalemia    Plan:  -Her thrombocytopenia is likely secondary to chemotherapy, ITP is less likely, her immature platelet count is normal. -will check with Dr. Johny Blamer to see if SBRT is an option, which she does not have to have chemo with SBRT -if the traditional external beam radiation is still planned, I think concurrent Xeloda is a very reasonable plan.  Will offer her oral Promacta to stimulate her bone marrow to produce more platelet count, so she can get on Xarelto with radiation.  Due to her chronic infection,  I recommend her to postpone radiation until next week -I will f/u as needed, certainly keep patient posted if her insurance approves Promacta.   Malachy Mood, MD 04/04/2023

## 2023-04-04 NOTE — Progress Notes (Signed)
   04/04/23 1055  TOC Brief Assessment  Insurance and Status Reviewed  Patient has primary care physician Yes  Home environment has been reviewed home with spouse  Prior level of function: indepedent  Prior/Current Home Services No current home services  Social Determinants of Health Reivew SDOH reviewed no interventions necessary  Readmission risk has been reviewed Yes  Transition of care needs no transition of care needs at this time

## 2023-04-05 ENCOUNTER — Inpatient Hospital Stay (HOSPITAL_COMMUNITY): Payer: BC Managed Care – PPO

## 2023-04-05 ENCOUNTER — Inpatient Hospital Stay (HOSPITAL_COMMUNITY): Payer: BC Managed Care – PPO | Admitting: Anesthesiology

## 2023-04-05 ENCOUNTER — Encounter (HOSPITAL_COMMUNITY)
Admission: EM | Disposition: A | Discharge status: Home / Self Care | Payer: Self-pay | Source: Home / Self Care | Attending: Internal Medicine

## 2023-04-05 ENCOUNTER — Encounter (HOSPITAL_COMMUNITY): Payer: Self-pay | Admitting: Internal Medicine

## 2023-04-05 ENCOUNTER — Other Ambulatory Visit (HOSPITAL_COMMUNITY): Payer: Self-pay

## 2023-04-05 DIAGNOSIS — K810 Acute cholecystitis: Secondary | ICD-10-CM | POA: Diagnosis not present

## 2023-04-05 HISTORY — PX: ERCP: SHX5425

## 2023-04-05 HISTORY — PX: BILIARY STENT PLACEMENT: SHX5538

## 2023-04-05 HISTORY — PX: STENT REMOVAL: SHX6421

## 2023-04-05 SURGERY — ERCP, WITH INTERVENTION IF INDICATED
Anesthesia: Monitor Anesthesia Care

## 2023-04-05 MED ORDER — DEXTROSE-SODIUM CHLORIDE 5-0.45 % IV SOLN: INTRAVENOUS | Status: DC

## 2023-04-05 MED ORDER — FENTANYL CITRATE (PF) 250 MCG/5ML IJ SOLN
INTRAMUSCULAR | Status: DC | PRN
  Administered 2023-04-05: 100 ug via INTRAVENOUS

## 2023-04-05 MED ORDER — FENTANYL CITRATE (PF) 100 MCG/2ML IJ SOLN
INTRAMUSCULAR | Status: AC
  Filled 2023-04-05: qty 2

## 2023-04-05 MED ORDER — DICLOFENAC SUPPOSITORY 100 MG
RECTAL | Status: AC
Start: 2023-04-05 — End: ?
  Filled 2023-04-05: qty 1

## 2023-04-05 MED ORDER — MIDAZOLAM HCL 2 MG/2ML IJ SOLN
INTRAMUSCULAR | Status: DC | PRN
  Administered 2023-04-05: 2 mg via INTRAVENOUS

## 2023-04-05 MED ORDER — SODIUM CHLORIDE 0.9 % IV SOLN: INTRAVENOUS | Status: DC | PRN

## 2023-04-05 MED ORDER — PHENYLEPHRINE 80 MCG/ML (10ML) SYRINGE FOR IV PUSH (FOR BLOOD PRESSURE SUPPORT)
PREFILLED_SYRINGE | INTRAVENOUS | Status: DC | PRN
  Administered 2023-04-05: 120 ug via INTRAVENOUS

## 2023-04-05 MED ORDER — GLUCAGON HCL RDNA (DIAGNOSTIC) 1 MG IJ SOLR
INTRAMUSCULAR | Status: AC
Start: 2023-04-05 — End: ?
  Filled 2023-04-05: qty 1

## 2023-04-05 MED ORDER — CIPROFLOXACIN IN D5W 400 MG/200ML IV SOLN
INTRAVENOUS | Status: AC
Start: 2023-04-05 — End: ?
  Filled 2023-04-05: qty 200

## 2023-04-05 MED ORDER — PROPOFOL 10 MG/ML IV BOLUS
INTRAVENOUS | Status: DC | PRN
  Administered 2023-04-05: 100 ug/kg/min via INTRAVENOUS
  Administered 2023-04-05: 100 mg via INTRAVENOUS

## 2023-04-05 MED ORDER — SUCCINYLCHOLINE CHLORIDE 200 MG/10ML IV SOSY
PREFILLED_SYRINGE | INTRAVENOUS | Status: DC | PRN
  Administered 2023-04-05: 120 mg via INTRAVENOUS

## 2023-04-05 MED ORDER — MIDAZOLAM HCL 2 MG/2ML IJ SOLN
INTRAMUSCULAR | Status: AC
  Filled 2023-04-05: qty 2

## 2023-04-05 MED ORDER — LIDOCAINE 2% (20 MG/ML) 5 ML SYRINGE
INTRAMUSCULAR | Status: DC | PRN
  Administered 2023-04-05: 60 mg via INTRAVENOUS

## 2023-04-05 MED ORDER — DEXAMETHASONE SODIUM PHOSPHATE 10 MG/ML IJ SOLN
INTRAMUSCULAR | Status: DC | PRN
  Administered 2023-04-05: 10 mg via INTRAVENOUS

## 2023-04-05 NOTE — Progress Notes (Signed)
PROGRESS NOTE    Nicole Johnson  ZOX:[id number] DOB: 02-12-1961 DOA: 04/02/2023 PCP: Judy Pimple, MD    Brief Narrative:  recently diagnosed with unresectable pancreatic adenocarcinoma complicated by biliary stricture status post ERCP and stent placement, repeat ERCP and exchange of metal stent 11/6 presented to the ER with severe abdominal pain and nausea. She was planned to start treatment with Xeloda and radiation next week. In the emergency room she was found to be tachypneic, on room air. Blood pressure adequate. Platelets 86,000 which is chronic. Bilirubin 1.7. CT scan with distended gallbladder with gallstone suspected acute cholecystitis. Admitted with surgical consultation.   Subjective:  Patient seen and examined.  She had recurrent epigastric and right upper quadrant pain last night.  Denies any nausea or vomiting.  Scheduled for ERCP today.   Assessment & Plan:   Acute cholecystitis with cholelithiasis, recent ERCP and biliary stent exchange.  Stable LFTs. Followed by general surgery, recommended conservative management.  Patient currently not a surgical candidate for gallbladder removal because of surrounding cancer. N.p.o. with ice chips.  IV Zosyn.  Adequate pain medications.  Continue IV fluids. Patient's LFTs are stable, however suspected acute infection caused by recent stent manipulation.  GI following.  Dr. Elnoria Howard planning to do ERCP and stent exchange today.   Pancreatic adenocarcinoma with biliary stricture: Followed by oncology.  Patient was planning to start Xeloda as well as radiation treatment 11/14.  She can likely start radiation but may have to wait for chemotherapy until acute infection is over.  Oncology to follow-up.  Thrombocytopenia: Mostly chronic.  97 today.  Monitor.  Hypokalemia: Replaced and adequate.  Magnesium is adequate.    DVT prophylaxis: SCDs Start: 04/03/23 0044   Code Status: Full code Family Communication: None today. Disposition  Plan: Status is: Inpatient Remains inpatient appropriate because: IV antibiotics, IV fluid, inpatient procedures     Consultants:  Gastroenterology Surgery  Procedures:  None  Antimicrobials:  Zosyn 11/9----     Objective: Vitals:   04/04/23 0606 04/04/23 1246 04/04/23 1927 04/05/23 0511  BP: 126/78 105/63 124/69 108/71  Pulse: 94 88 93   Resp: 17 16 20 20   Temp: 97.6 F (36.4 C) (!) 97.5 F (36.4 C)  98.2 F (36.8 C)  TempSrc: Oral Oral  Oral  SpO2: 95% 97% 98% 96%  Weight:      Height:        Intake/Output Summary (Last 24 hours) at 04/05/2023 1134 Last data filed at 04/05/2023 0809 Gross per 24 hour  Intake 2178.18 ml  Output --  Net 2178.18 ml   Filed Weights   04/02/23 1522  Weight: 72.6 kg    Examination:  General exam: Appears calm and comfortable .  Slightly anxious for procedure today. Respiratory system: Clear to auscultation. Respiratory effort normal. Port-A-Cath present on right chest wall. Cardiovascular system: S1 & S2 heard, RRR.  No edema.   Gastrointestinal system: Mild generalized epigastric pain.  No rigidity or guarding.  Bowel sound present. Central nervous system: Alert and oriented. No focal neurological deficits. Extremities: Symmetric 5 x 5 power. Skin: No rashes, lesions or ulcers Psychiatry: Judgement and insight appear normal. Mood & affect appropriate.     Data Reviewed: I have personally reviewed following labs and imaging studies  CBC: Recent Labs  Lab 04/02/23 1536 04/03/23 0355 04/04/23 0425  WBC 6.9 6.8 6.8  NEUTROABS 6.2  --  5.9  HGB 11.2* 9.2* 9.9*  HCT 34.9* 29.0* 31.5*  MCV 89.3 89.8 90.5  PLT 86* 75* 97*   Basic Metabolic Panel: Recent Labs  Lab 04/02/23 1536 04/03/23 0355 04/04/23 0425  NA 132* 131* 129*  K 3.9 3.1* 3.9  CL 100 100 98  CO2 22 20* 23  GLUCOSE 105* 108* 136*  BUN 16 21 21   CREATININE 0.73 0.70 0.74  CALCIUM 8.6* 8.3* 8.4*  MG 1.8  --  2.0  PHOS  --   --  2.5    GFR: Estimated Creatinine Clearance: 73.7 mL/min (by C-G formula based on SCr of 0.74 mg/dL). Liver Function Tests: Recent Labs  Lab 04/02/23 1536 04/03/23 0355 04/04/23 0425  AST 35 30 30  ALT 57* 46* 44  ALKPHOS 137* 116 126  BILITOT 1.7* 1.5* 1.3*  PROT 6.8 6.0* 6.0*  ALBUMIN 3.5 3.0* 3.0*   Recent Labs  Lab 04/02/23 1536  LIPASE 22   No results for input(s): "AMMONIA" in the last 168 hours. Coagulation Profile: Recent Labs  Lab 04/02/23 1536 04/03/23 0355  INR 1.4* 1.7*   Cardiac Enzymes: No results for input(s): "CKTOTAL", "CKMB", "CKMBINDEX", "TROPONINI" in the last 168 hours. BNP (last 3 results) No results for input(s): "PROBNP" in the last 8760 hours. HbA1C: No results for input(s): "HGBA1C" in the last 72 hours. CBG: No results for input(s): "GLUCAP" in the last 168 hours. Lipid Profile: No results for input(s): "CHOL", "HDL", "LDLCALC", "TRIG", "CHOLHDL", "LDLDIRECT" in the last 72 hours. Thyroid Function Tests: No results for input(s): "TSH", "T4TOTAL", "FREET4", "T3FREE", "THYROIDAB" in the last 72 hours. Anemia Panel: No results for input(s): "VITAMINB12", "FOLATE", "FERRITIN", "TIBC", "IRON", "RETICCTPCT" in the last 72 hours. Sepsis Labs: Recent Labs  Lab 04/02/23 1606 04/02/23 2213  LATICACIDVEN 1.1 0.8    Recent Results (from the past 240 hour(s))  Blood culture (routine x 2)     Status: None (Preliminary result)   Collection Time: 04/02/23  4:00 PM   Specimen: BLOOD  Result Value Ref Range Status   Specimen Description   Final    BLOOD RIGHT ANTECUBITAL Performed at [facility], [address] [place], [place] [address]    Special Requests   Final    BOTTLES DRAWN AEROBIC AND ANAEROBIC Blood Culture adequate volume Performed at [facility], [address], [place], [place] [address]    Culture   Final    NO GROWTH 3 DAYS Performed at [facility], [address], [place], [place]  [address]    Report Status PENDING  Incomplete  Blood culture (routine x 2)     Status: None (Preliminary result)   Collection Time: 04/02/23  9:59 PM   Specimen: BLOOD  Result Value Ref Range Status   Specimen Description   Final    BLOOD PORTA CATH Performed at [facility], [address] [place], [place] [address]    Special Requests   Final    BOTTLES DRAWN AEROBIC AND ANAEROBIC Blood Culture adequate volume Performed at [facility], [address], [place], [place] [address]    Culture   Final    NO GROWTH 2 DAYS Performed at [facility], [address], [place], [place] [address]    Report Status PENDING  Incomplete         Radiology Studies: No results found.      Scheduled Meds:  Chlorhexidine Gluconate Cloth  6 each Topical Q0600   loratadine  10 mg Oral Daily   sodium chloride flush  10-40 mL Intracatheter  Q12H   Continuous Infusions:  dextrose 5 % and 0.45 % NaCl 100 mL/hr at 04/05/23 0804   piperacillin-tazobactam (ZOSYN)  IV 3.375 g (04/05/23 0510)     LOS: 2 days    Time spent: 40 minutes    Dorcas Carrow, MD [facility]

## 2023-04-05 NOTE — Progress Notes (Signed)
I have reviewed and concur with this student's documentation. CI was present during med pass.  Leodis Sias, RN 04/05/2023 3:54 PM

## 2023-04-05 NOTE — Anesthesia Preprocedure Evaluation (Addendum)
Anesthesia Evaluation  Patient identified by MRN, date of birth, ID band Patient awake    Reviewed: Allergy & Precautions, H&P , NPO status , Patient's Chart, lab work & pertinent test results  History of Anesthesia Complications (+) PONV and history of anesthetic complications  Airway Mallampati: II  TM Distance: >3 FB Neck ROM: full    Dental  (+) Dental Advisory Given, Teeth Intact   Pulmonary neg pulmonary ROS   breath sounds clear to auscultation       Cardiovascular negative cardio ROS  Rhythm:regular Rate:Normal     Neuro/Psych    GI/Hepatic ,GERD  ,,  Endo/Other    Renal/GU      Musculoskeletal  (+) Arthritis ,    Abdominal   Peds  Hematology  (+) Blood dyscrasia, anemia   Anesthesia Other Findings Prostate Cancer  Reproductive/Obstetrics                             Anesthesia Physical Anesthesia Plan  ASA: 3  Anesthesia Plan: General   Post-op Pain Management: Minimal or no pain anticipated   Induction: Intravenous  PONV Risk Score and Plan: 4 or greater and Treatment may vary due to age or medical condition, Ondansetron, Dexamethasone, Midazolam and Droperidol  Airway Management Planned: Oral ETT  Additional Equipment:   Intra-op Plan:   Post-operative Plan: Extubation in OR  Informed Consent: I have reviewed the patients History and Physical, chart, labs and discussed the procedure including the risks, benefits and alternatives for the proposed anesthesia with the patient or authorized representative who has indicated his/her understanding and acceptance.     Dental advisory given  Plan Discussed with: CRNA  Anesthesia Plan Comments:        Anesthesia Quick Evaluation

## 2023-04-05 NOTE — Plan of Care (Signed)
  Problem: Education: Goal: Knowledge of General Education information will improve Description: Including pain rating scale, medication(s)/side effects and non-pharmacologic comfort measures Outcome: Progressing   Problem: Health Behavior/Discharge Planning: Goal: Ability to manage health-related needs will improve Outcome: Progressing   Problem: Coping: Goal: Level of anxiety will decrease Outcome: Progressing   Problem: Pain Management: Goal: General experience of comfort will improve Outcome: Progressing

## 2023-04-05 NOTE — Transfer of Care (Signed)
Immediate Anesthesia Transfer of Care Note  Patient: Nicole Johnson  Procedure(s) Performed: ENDOSCOPIC RETROGRADE CHOLANGIOPANCREATOGRAPHY (ERCP) STENT REMOVAL BILIARY STENT PLACEMENT  Patient Location: PACU  Anesthesia Type:General  Level of Consciousness: sedated  Airway & Oxygen Therapy: Patient Spontanous Breathing  Post-op Assessment: Report given to RN  Post vital signs: Reviewed and stable  Last Vitals:  Vitals Value Taken Time  BP 98/66 04/05/23 1446  Temp 37.1 C 04/05/23 1443  Pulse 90 04/05/23 1448  Resp 9 04/05/23 1448  SpO2 91 % 04/05/23 1448  Vitals shown include unfiled device data.  Last Pain:  Vitals:   04/05/23 1443  TempSrc: Temporal  PainSc:       Patients Stated Pain Goal: 3 (04/05/23 0726)  Complications: No notable events documented.

## 2023-04-05 NOTE — Plan of Care (Signed)

## 2023-04-05 NOTE — Interval H&P Note (Signed)
History and Physical Interval Note:  04/05/2023 12:53 PM  Nicole Johnson  has presented today for surgery, with the diagnosis of Biliary obstruction.  The various methods of treatment have been discussed with the patient and family. After consideration of risks, benefits and other options for treatment, the patient has consented to  Procedure(s): ENDOSCOPIC RETROGRADE CHOLANGIOPANCREATOGRAPHY (ERCP) (N/A) as a surgical intervention.  The patient's history has been reviewed, patient examined, no change in status, stable for surgery.  I have reviewed the patient's chart and labs.  Questions were answered to the patient's satisfaction.     Bain Whichard D

## 2023-04-05 NOTE — Anesthesia Procedure Notes (Signed)
Procedure Name: Intubation Date/Time: 04/05/2023 1:51 PM  Performed by: Randa Evens, CRNAPre-anesthesia Checklist: Patient identified, Emergency Drugs available, Suction available and Patient being monitored Patient Re-evaluated:Patient Re-evaluated prior to induction Oxygen Delivery Method: Circle System Utilized Preoxygenation: Pre-oxygenation with 100% oxygen Induction Type: IV induction Ventilation: Mask ventilation without difficulty Laryngoscope Size: Mac and 3 Grade View: Grade I Tube type: Oral Tube size: 7.0 mm Number of attempts: 1 Airway Equipment and Method: Stylet and Oral airway Placement Confirmation: ETT inserted through vocal cords under direct vision, positive ETCO2 and breath sounds checked- equal and bilateral Secured at: 21 cm Tube secured with: Tape Dental Injury: Teeth and Oropharynx as per pre-operative assessment

## 2023-04-05 NOTE — Op Note (Signed)
[facility] Patient Name: Nicole Johnson Procedure Date: 04/05/2023 MRN: [id number] Attending MD: Jeani Hawking , MD, [id number] Date of Birth: 06-Feb-1961 CSN: [id number] Age: 62 Admit Type: Inpatient Procedure:                ERCP Indications:              Biliary dilation on Ultrasound, Malignant tumor of                            the head of pancreas, Stent change Providers:                Jeani Hawking, MD, Janae Sauce. Steele Berg, RN, Janie                            Billups, Sabino Niemann CRNA Referring MD:              Medicines:                General Anesthesia Complications:            No immediate complications. Estimated Blood Loss:     Estimated blood loss: none. Procedure:                Pre-Anesthesia Assessment:                           - Prior to the procedure, a History and Physical                            was performed, and patient medications and                            allergies were reviewed. The patient's tolerance of                            previous anesthesia was also reviewed. The risks                            and benefits of the procedure and the sedation                            options and risks were discussed with the patient.                            All questions were answered, and informed consent                            was obtained. Prior Anticoagulants: The patient has                            taken no anticoagulant or antiplatelet agents. ASA                            Grade Assessment: III - A patient with severe  systemic disease. After reviewing the risks and                            benefits, the patient was deemed in satisfactory                            condition to undergo the procedure.                           - Sedation was administered by an anesthesia                            professional. General anesthesia was attained.                           After obtaining  informed consent, the scope was                            passed under direct vision. Throughout the                            procedure, the patient's blood pressure, pulse, and                            oxygen saturations were monitored continuously. The                            [id number]-Q190V ([id number] Olympus duodenoscope was                            introduced through the mouth, and used to inject                            contrast into and used to inject contrast into the                            bile duct. The ERCP was technically difficult and                            complex. The patient tolerated the procedure well. Scope In: Scope Out: Findings:      A biliary stent was visible on the scout film. One covered metal stent       originating in the biliary tree was emerging from the major papilla. The       stent was visibly patent. One stent was removed from the common bile       duct using a Raptor grasping device. The bile duct was deeply cannulated       with the short-nosed traction sphincterotome. Contrast was injected. I       personally interpreted the bile duct images. There was brisk flow of       contrast through the ducts. Image quality was adequate. Contrast       extended to the main bile duct. The common bile duct contained a single       segmental stenosis 20 mm in length. A  short 0.035 inch Soft Jagwire was       passed into the biliary tree. One 8.5 Fr by 5 cm plastic stent with a       single external flap and a single internal flap was placed 4.5 cm into       the common bile duct. Bile and pus flowed through the stent. The stent       was in good position.      The prior biliary stent was identified and there was evidence of       drainage of pus. With the Rat-toothed forceps the leading edge of the       metallic biliary stent was captured, which collapses the stent. However,       it was difficult to remove the stent. With gentle to moderate prodding,        the stent was able to be pulled out partially. While maintaining a hold       on the stent, the ERCP scope was pulled back into the gastric lumen,       which then completely dislodged the stent. The stent was released in the       gastric lumen. The ERCP scope was advanced again in to the second       portion of the duodenum. Cannulation was difficult as the lumen stenosed       secondary to the malignancy. After multiple tries in different       positions, the guidewire was able to be advanced into the left hepatic       ducts. Contrast injection revealed proper cannulation. An occlusion       cholangiogram was performed to determine the location of the cystic duct       and for any potential leaks from the stent removal. It was concluded       that the covered metallic stent walled off the cystic duct, which gave       rise to the patient's acute cholecytitis. Contrast injection did show       that the cystic duct take off was just proximal to the pancreatic head       tumor. Placement of any shorter metallic stent would result in the same       issue of cutting off the cystic duct. Because she was well with the       prior 8.5 Fr x 5 cm stent, only one stent was placed. Impression:               - One visibly patent stent from the biliary tree                            was seen in the major papilla.                           - A single segmental biliary stricture was found in                            the common bile duct. The stricture was malignant                            appearing.                           -  One stent was removed from the common bile duct.                           - One plastic stent was placed into the common bile                            duct. Moderate Sedation:      Not Applicable - Patient had care per Anesthesia. Recommendation:           - Return patient to hospital ward for ongoing care.                           - Resume regular diet.                            - Upon discharge treat with antibiotics for another                            7 days.                           - Repeat ERCP with stent placement in 1-3 months                            with double stent placement. Procedure Code(s):        --- Professional ---                           [phone], Endoscopic retrograde                            cholangiopancreatography (ERCP); with removal and                            exchange of stent(s), biliary or pancreatic duct,                            including pre- and post-dilation and guide wire                            passage, when performed, including sphincterotomy,                            when performed, each stent exchanged                           [id number], Endoscopic catheterization of the biliary                            ductal system, radiological supervision and                            interpretation Diagnosis Code(s):        --- Professional ---  C25.0, Malignant neoplasm of head of pancreas                           Z96.89, Presence of other specified functional                            implants                           K83.1, Obstruction of bile duct                           Z46.59, Encounter for fitting and adjustment of                            other gastrointestinal appliance and device                           K83.8, Other specified diseases of biliary tract CPT copyright 2022 [employer]. All rights reserved. The codes documented in this report are preliminary and upon coder review may  be revised to meet current compliance requirements. Jeani Hawking, MD Jeani Hawking, MD 04/05/2023 6:53:55 PM This report has been signed electronically. Number of Addenda: 0

## 2023-04-06 ENCOUNTER — Other Ambulatory Visit: Payer: Self-pay | Admitting: Hematology

## 2023-04-06 DIAGNOSIS — K810 Acute cholecystitis: Secondary | ICD-10-CM | POA: Diagnosis not present

## 2023-04-06 MED ORDER — ELTROMBOPAG OLAMINE 75 MG PO TABS
75.0000 mg | ORAL_TABLET | Freq: Every day | ORAL | 0 refills | Status: DC
  Filled 2023-04-07: qty 30, 30d supply, fill #0

## 2023-04-06 MED ORDER — HYDROCODONE-ACETAMINOPHEN 5-325 MG PO TABS: 1.0000 | ORAL_TABLET | ORAL | 0 refills | Status: DC | PRN

## 2023-04-06 MED ORDER — PREDNISONE 20 MG PO TABS: 20.0000 mg | ORAL_TABLET | Freq: Once | ORAL | Status: DC

## 2023-04-06 MED ORDER — HEPARIN SOD (PORK) LOCK FLUSH 100 UNIT/ML IV SOLN
500.0000 [IU] | INTRAVENOUS | Status: AC | PRN
  Administered 2023-04-06: 500 [IU]
  Filled 2023-04-06: qty 5

## 2023-04-06 MED ORDER — AMOXICILLIN-POT CLAVULANATE 875-125 MG PO TABS: 1.0000 | ORAL_TABLET | Freq: Two times a day (BID) | ORAL | 0 refills | Status: AC

## 2023-04-06 NOTE — Progress Notes (Signed)
Subjective: She is well at this time.  No post procedural complications.  Objective: Vital signs in last 24 hours: Temp:  [97.4 F (36.3 C)-98.7 F (37.1 C)] 97.4 F (36.3 C) (11/13 0539) Pulse Rate:  [81-96] 84 (11/13 0539) Resp:  [10-20] 17 (11/13 0539) BP: (81-125)/(51-78) 116/76 (11/13 0539) SpO2:  [92 %-100 %] 100 % (11/13 0539) Last BM Date : 04/02/23  Intake/Output from previous day: 11/12 0701 - 11/13 0700 In: 2203.6 [I.V.:1899.3; IV Piggyback:304.3] Out: 0  Intake/Output this shift: No intake/output data recorded.  General appearance: alert and no distress GI: soft, non-tender; bowel sounds normal; no masses,  no organomegaly  Lab Results: Recent Labs    04/04/23 0425  WBC 6.8  HGB 9.9*  HCT 31.5*  PLT 97*   BMET Recent Labs    04/04/23 0425  NA 129*  K 3.9  CL 98  CO2 23  GLUCOSE 136*  BUN 21  CREATININE 0.74  CALCIUM 8.4*   LFT Recent Labs    04/04/23 0425  PROT 6.0*  ALBUMIN 3.0*  AST 30  ALT 44  ALKPHOS 126  BILITOT 1.3*   PT/INR No results for input(s): "LABPROT", "INR" in the last 72 hours. Hepatitis Panel No results for input(s): "HEPBSAG", "HCVAB", "HEPAIGM", "HEPBIGM" in the last 72 hours. C-Diff No results for input(s): "CDIFFTOX" in the last 72 hours. Fecal Lactopherrin No results for input(s): "FECLLACTOFRN" in the last 72 hours.  Studies/Results: No results found.  Medications: Scheduled:  Chlorhexidine Gluconate Cloth  6 each Topical Q0600   loratadine  10 mg Oral Daily   sodium chloride flush  10-40 mL Intracatheter Q12H   Continuous:  dextrose 5 % and 0.45 % NaCl 100 mL/hr at 04/05/23 2103   piperacillin-tazobactam (ZOSYN)  IV 3.375 g (04/06/23 0543)    Assessment/Plan: 1) Pancreatic cancer. 2) Acute cholecystitis.   The patient is well clinically.  After removal and  the stent and further investigation, this particular stent that was removed yesterday was designed to be a permanent stent.  Fortunately there  were no complications.  Again, during the procedure the cholangiogram did not show any leak of contrast to suggest a perforation.  Because she has a low take off for her cystic duct, she will benefit with plastic biliary stents.    Plan: 1) Discharge with Augmentin 875 mg BID x 7 days. 2) Further treatment of her pancreatic cancer per Oncology.  LOS: 3 days   Nicole Johnson 04/06/2023, 7:10 AM

## 2023-04-06 NOTE — Plan of Care (Signed)
Patient discharging home via private vehicle with husband Calvin. Awaiting de-access of port. IV team was notified. Haydee Salter, RN 04/06/23 10:43 AM

## 2023-04-06 NOTE — Discharge Summary (Signed)
Physician Discharge Summary  Nicole Johnson OZH:[id number] DOB: Apr 14, 1961 DOA: 04/02/2023  PCP: Judy Pimple, MD  Admit date: 04/02/2023 Discharge date: 04/06/2023  Admitted From: Home Disposition: Home  Recommendations for Outpatient Follow-up:  Follow up with PCP in 1-2 weeks Please obtain BMP/CBC in one week GI will schedule follow-up.  Keep follow-up with oncology as previously planned.  Home Health: N/A Equipment/Devices: N/A  Discharge Condition: Stable CODE STATUS: Full code Diet recommendation: Regular diet, nutritional supplements  Discharge Summary: Patient Recently diagnosed with unresectable pancreatic adenocarcinoma complicated by biliary stricture status post ERCP and stent placement, repeat ERCP and exchange of metal stent 11/6 presented to the ER with severe abdominal pain and nausea. She was planned to start treatment with Xeloda and radiation next week. In the emergency room she was found to be tachypneic, on room air. Blood pressure adequate. Platelets 86,000 which is chronic. Bilirubin 1.7. CT scan with distended gallbladder with gallstone suspected acute cholecystitis. Admitted with surgical and GI consultations.  Treated conservatively.  Also underwent ERCP with exchange of the stent.  # Acute cholecystitis with cholelithiasis, recent ERCP and biliary stent exchange.  Stable LFTs. Patient was seen by general surgery, recommended conservative management due to pancreatic cancer.  She did respond well to antibiotic therapy and did not require any gallbladder drainage.  She was treated with Zosyn for last 4 days.  Pain is controlled now.  Return of bowel functions and tolerating regular diet.  Patient underwent ERCP for and exchange of the biliary stent.  Monitored after the procedure.  Stable now.  As per GI recommendation, will discharge patient home with Augmentin for additional 7 days.  They will schedule follow-up.  Patient was also prescribed a short course of  oxycodone.  # Pancreatic adenocarcinoma with biliary stricture: Followed by oncology.  Patient was planning to start Xeloda as well as radiation treatment 11/14.  She can likely start radiation but may have to wait for chemotherapy until acute infection is over.  Oncology to follow-up.   # Thrombocytopenia: Mostly chronic and stable.    Hypokalemia: Replaced and adequate.  Magnesium is adequate.  Stable to discharge home today with outpatient follow-up.   Discharge Diagnoses:  Principal Problem:   Acute cholecystitis Active Problems:   Pancreatic cancer (HCC)   Thrombocytopenia (HCC)   Hyponatremia    Discharge Instructions  Discharge Instructions     Diet general   Complete by: As directed    Increase activity slowly   Complete by: As directed       Allergies as of 04/06/2023       Reactions   Other Nausea Only, Other (See Comments)   Certain types of Anesthesia cause SEVERE NAUSEA   Codeine Nausea Only   Meloxicam Nausea Only   Tramadol Nausea Only        Medication List     TAKE these medications    Advil 200 MG Caps Generic drug: Ibuprofen Take 400-600 mg by mouth every 6 (six) hours as needed (for pain).   amoxicillin-clavulanate 875-125 MG tablet Commonly known as: AUGMENTIN Take 1 tablet by mouth 2 (two) times daily for 7 days.   CALTRATE 600+D3 PO Take 1 tablet by mouth daily with breakfast.   capecitabine 500 MG tablet Commonly known as: XELODA Take by mouth.   Creon 24000-76000 units Cpep Generic drug: Pancrelipase (Lip-Prot-Amyl) Take 1 capsule by mouth before each meal, and take 1 capsule before snacks.   cyanocobalamin 500 MCG tablet Commonly known as: VITAMIN  B12 Take 500 mcg by mouth daily.   fluticasone 50 MCG/ACT nasal spray Commonly known as: FLONASE Place 2 sprays into both nostrils daily. What changed:  when to take this reasons to take this   HYDROcodone-acetaminophen 5-325 MG tablet Commonly known as:  NORCO/VICODIN Take 1 tablet by mouth every 4 (four) hours as needed for moderate pain (pain score 4-6).   Iron 325 (65 Fe) MG Tabs Take 325 mg by mouth daily with breakfast.   MAGNESIUM PO Take 1 tablet by mouth daily.   multivitamin-lutein Caps capsule Take 1 capsule by mouth daily.   NON FORMULARY Apply 1 application  topically See admin instructions. Hot Cream  Muscle & Joint Cream/Capsaicin- based- Apply to painful or itchy areas as directed/as needed for relief   omeprazole 20 MG capsule Commonly known as: PRILOSEC TAKE 1 CAPSULE(20 MG) BY MOUTH DAILY What changed: See the new instructions.   ondansetron 8 MG tablet Commonly known as: ZOFRAN Take 8 mg by mouth every 8 (eight) hours as needed for nausea or vomiting.   triamcinolone cream 0.1 % Commonly known as: KENALOG Apply 1 Application topically 2 (two) times daily as needed.   TURMERIC PO Take 2 capsules by mouth daily.   VITAMIN B6 PO Take 1 tablet by mouth daily.        Allergies  Allergen Reactions   Other Nausea Only and Other (See Comments)    Certain types of Anesthesia cause SEVERE NAUSEA   Codeine Nausea Only   Meloxicam Nausea Only   Tramadol Nausea Only    Consultations: General surgery  GI    Procedures/Studies: US Abdomen Limited RUQ (LIVER/GB)  Result Date: 04/03/2023 CLINICAL DATA:  Right upper quadrant pain. History of pancreatic cancer. EXAM: ULTRASOUND ABDOMEN LIMITED RIGHT UPPER QUADRANT COMPARISON:  04/02/2023. FINDINGS: Gallbladder: The gallbladder is distended with heterogeneous wall thickening measuring up to 15 mm. No stones are identified. No sonographic Murphy sign noted by sonographer, however patient has received pain meds. Common bile duct: Diameter: 7.1 mm Liver: A hypervascular hypoechoic lesion is noted at the gallbladder fossa measuring 3.0 x 2.8 x 2.0 cm. Within normal limits in parenchymal echogenicity. Portal vein is patent on color Doppler imaging with normal  direction of blood flow towards the liver. Other: None. IMPRESSION: 1. Distended gallbladder with complex thickening of the gallbladder wall measuring up to 15 mm. The possibility of cholecystitis can not be excluded. Surgical consultation is recommended. 2. Hypervascular hypoechoic lesion in the liver at the gallbladder fossa measuring 3.0 cm. MRI with contrast is recommended for further evaluation. Electronically Signed   By: Thornell Sartorius M.D.   On: 04/03/2023 00:41   CT ABDOMEN PELVIS W CONTRAST  Result Date: 04/02/2023 CLINICAL DATA:  Abdominal pain, acute, nonlocalized. Right upper quadrant abdominal pain since having procedure done on Wednesday. EXAM: CT ABDOMEN AND PELVIS WITH CONTRAST TECHNIQUE: Multidetector CT imaging of the abdomen and pelvis was performed using the standard protocol following bolus administration of intravenous contrast. RADIATION DOSE REDUCTION: This exam was performed according to the departmental dose-optimization program which includes automated exposure control, adjustment of the mA and/or kV according to patient size and/or use of iterative reconstruction technique. CONTRAST:  OMNIPAQUE IOHEXOL 300 MG/ML  SOLN COMPARISON:  01/11/2023. FINDINGS: Lower chest: Heart is normal in size and there is a small pericardial effusion. Mild atelectasis is present at the lung bases. Hepatobiliary: No focal abnormality in the liver. Fatty infiltration of the liver is noted. Pneumobilia is noted in the left  lobe. The gallbladder is distended. There is cholelithiasis with surrounding inflammatory changes. Fat stranding and a small amount of free fluid are noted at gallbladder fossa, porta hepatis, and perihepatic space. There is intrahepatic biliary ductal dilatation. A common bile duct stent is noted and appears patent. Pancreas: An ill-defined hypodense region is noted in the pancreatic head, compatible with known mass. There is atrophy of the pancreatic body and tail with pancreatic  ductal dilatation measuring 5 mm. The mass encases the celiac artery and SMA. There is encasement of the SMV, portal vein and portal confluence with segmental narrowing. No definite evidence of thrombus. Spleen: The spleen is markedly enlarged at 20.9 cm. Adrenals/Urinary Tract: The adrenal glands are within normal limits. The kidneys enhance symmetrically. Hypodensity is noted in the upper pole of the left kidney, likely cyst. No renal calculus or hydronephrosis. The bladder is unremarkable. Stomach/Bowel: There is mild thickening of the walls of the gastric antrum and proximal duodenum adjacent to the pancreatic mass. No bowel obstruction, free air, or pneumatosis. There is mild diffuse colonic wall thickening with pericolonic fat stranding, most pronounced at the ascending colon and hepatic flexure. Appendix appears normal. Vascular/Lymphatic: Aorta is normal in caliber. Prominent lymph nodes are noted at the porta hepatis and gastrohepatic ligament. Reproductive: Uterus and bilateral adnexa are unremarkable. Other: Small amount of free fluid in the perihepatic space. Musculoskeletal: Degenerative changes are present in the thoracolumbar spine. No acute osseous abnormality. IMPRESSION: 1. Distended gallbladder with cholelithiasis and inflammatory changes at the gallbladder fossa, possible acute cholecystitis. Ultrasound is recommended for further evaluation. 2. Mild intrahepatic biliary ductal dilatation and pneumobilia. Common bile duct stent appears patent. 3. Ill-defined hypodense pancreatic head mass compatible with known pancreatic adenocarcinoma with encasement of the vasculature, as described above. 4. Thickening of the walls of the gastric antrum and proximal duodenum in the region of the pancreatic mass, possible gastritis/duodenitis. The possibility of infiltrative process can not be excluded. 5. Findings suggestive infectious or inflammatory colitis. 6. Hepatic steatosis. 7. Splenomegaly.  Electronically Signed   By: Thornell Sartorius M.D.   On: 04/02/2023 23:09   (Echo, Carotid, EGD, Colonoscopy, ERCP)    Subjective: Patient seen and examined.  Husband at the bedside.  She is eating regular diet.  She had occasional right flank pain.  She had regular bowel movements today morning mostly liquidy.  Eager to go home.  Afebrile.   Discharge Exam: Vitals:   04/06/23 0539 04/06/23 0927  BP: 116/76 124/75  Pulse: 84 85  Resp: 17 20  Temp: (!) 97.4 F (36.3 C) (!) 96.1 F (35.6 C)  SpO2: 100% 99%   Vitals:   04/05/23 2106 04/06/23 0133 04/06/23 0539 04/06/23 0927  BP: 120/70 111/75 116/76 124/75  Pulse: 85 81 84 85  Resp: 17 17 17 20   Temp: (!) 97.5 F (36.4 C) 97.7 F (36.5 C) (!) 97.4 F (36.3 C) (!) 96.1 F (35.6 C)  TempSrc: Oral Oral Oral Oral  SpO2: 96% 96% 100% 99%  Weight:      Height:        General: Pt is alert, awake, not in acute distress Cardiovascular: RRR, S1/S2 +, no rubs, no gallops Respiratory: CTA bilaterally, no wheezing, no rhonchi, Port-A-Cath present on the right chest wall.  Abdominal: Soft, NT, ND, bowel sounds + Extremities: no edema, no cyanosis    The results of significant diagnostics from this hospitalization (including imaging, microbiology, ancillary and laboratory) are listed below for reference.     Microbiology: Recent  Results (from the past 240 hour(s))  Blood culture (routine x 2)     Status: None (Preliminary result)   Collection Time: 04/02/23  4:00 PM   Specimen: BLOOD  Result Value Ref Range Status   Specimen Description   Final    BLOOD RIGHT ANTECUBITAL Performed at [facility], [address], [place], [place] [address]    Special Requests   Final    BOTTLES DRAWN AEROBIC AND ANAEROBIC Blood Culture adequate volume Performed at [facility], [address] [place], [place] [address]    Culture   Final    NO GROWTH 4 DAYS Performed at [facility] [place] [facility], [address], [place], [place] [address]    Report Status PENDING  Incomplete  Blood culture (routine x 2)     Status: None (Preliminary result)   Collection Time: 04/02/23  9:59 PM   Specimen: BLOOD  Result Value Ref Range Status   Specimen Description   Final    BLOOD PORTA CATH Performed at [facility], [address], [place], [place] [address]    Special Requests   Final    BOTTLES DRAWN AEROBIC AND ANAEROBIC Blood Culture adequate volume Performed at [facility], [address], [place], [place] [address]    Culture   Final    NO GROWTH 3 DAYS Performed at [facility], [address] [place], [place] [address]    Report Status PENDING  Incomplete     Labs: BNP (last 3 results) No results for input(s): "BNP" in the last 8760 hours. Basic Metabolic Panel: Recent Labs  Lab 04/02/23 1536 04/03/23 0355 04/04/23 0425  NA 132* 131* 129*  K 3.9 3.1* 3.9  CL 100 100 98  CO2 22 20* 23  GLUCOSE 105* 108* 136*  BUN 16 21 21   CREATININE 0.73 0.70 0.74  CALCIUM 8.6* 8.3* 8.4*  MG 1.8  --  2.0  PHOS  --   --  2.5   Liver Function Tests: Recent Labs  Lab 04/02/23 1536 04/03/23 0355 04/04/23 0425  AST 35 30 30  ALT 57* 46* 44  ALKPHOS 137* 116 126  BILITOT 1.7* 1.5* 1.3*  PROT 6.8 6.0* 6.0*  ALBUMIN 3.5 3.0* 3.0*   Recent Labs  Lab 04/02/23 1536  LIPASE 22   No results for input(s): "AMMONIA" in the last 168 hours. CBC: Recent Labs  Lab 04/02/23 1536 04/03/23 0355 04/04/23 0425  WBC 6.9 6.8 6.8  NEUTROABS 6.2  --  5.9  HGB 11.2* 9.2* 9.9*  HCT 34.9* 29.0* 31.5*  MCV 89.3 89.8 90.5  PLT 86* 75* 97*   Cardiac Enzymes: No results for input(s): "CKTOTAL", "CKMB", "CKMBINDEX", "TROPONINI" in the last 168 hours. BNP: Invalid input(s): "POCBNP" CBG: No results for input(s): "GLUCAP" in the last 168 hours. D-Dimer No results for input(s): "DDIMER" in the last 72 hours. Hgb A1c No results for input(s): "HGBA1C" in the last 72  hours. Lipid Profile No results for input(s): "CHOL", "HDL", "LDLCALC", "TRIG", "CHOLHDL", "LDLDIRECT" in the last 72 hours. Thyroid function studies No results for input(s): "TSH", "T4TOTAL", "T3FREE", "THYROIDAB" in the last 72 hours.  Invalid input(s): "FREET3" Anemia work up No results for input(s): "VITAMINB12", "FOLATE", "FERRITIN", "TIBC", "IRON", "RETICCTPCT" in the last 72 hours. Urinalysis    Component Value Date/Time   COLORURINE AMBER (A) 04/02/2023 1605   APPEARANCEUR HAZY (A) 04/02/2023 1605   LABSPEC 1.028 04/02/2023 1605   PHURINE 5.0  04/02/2023 1605   GLUCOSEU NEGATIVE 04/02/2023 1605   HGBUR NEGATIVE 04/02/2023 1605   BILIRUBINUR NEGATIVE 04/02/2023 1605   KETONESUR 20 (A) 04/02/2023 1605   PROTEINUR 30 (A) 04/02/2023 1605   NITRITE NEGATIVE 04/02/2023 1605   LEUKOCYTESUR TRACE (A) 04/02/2023 1605   Sepsis Labs Recent Labs  Lab 04/02/23 1536 04/03/23 0355 04/04/23 0425  WBC 6.9 6.8 6.8   Microbiology Recent Results (from the past 240 hour(s))  Blood culture (routine x 2)     Status: None (Preliminary result)   Collection Time: 04/02/23  4:00 PM   Specimen: BLOOD  Result Value Ref Range Status   Specimen Description   Final    BLOOD RIGHT ANTECUBITAL Performed at [facility], [address] [place], [place] [address]    Special Requests   Final    BOTTLES DRAWN AEROBIC AND ANAEROBIC Blood Culture adequate volume Performed at [facility], [address] [place], [place] [address]    Culture   Final    NO GROWTH 4 DAYS Performed at [facility], [address], [place], [place] [address]    Report Status PENDING  Incomplete  Blood culture (routine x 2)     Status: None (Preliminary result)   Collection Time: 04/02/23  9:59 PM   Specimen: BLOOD  Result Value Ref Range Status   Specimen Description   Final    BLOOD PORTA CATH Performed at [facility], [address], [place], [place]  [address]    Special Requests   Final    BOTTLES DRAWN AEROBIC AND ANAEROBIC Blood Culture adequate volume Performed at [facility], [address], [place], [place] [address]    Culture   Final    NO GROWTH 3 DAYS Performed at [facility], [address], [place], [place] [address]    Report Status PENDING  Incomplete     Time coordinating discharge:  35 minutes  SIGNED:   Dorcas Carrow, MD  [facility] 04/06/2023, 11:26 AM

## 2023-04-06 NOTE — Anesthesia Postprocedure Evaluation (Signed)
Anesthesia Post Note  Patient: Nicole Johnson  Procedure(s) Performed: ENDOSCOPIC RETROGRADE CHOLANGIOPANCREATOGRAPHY (ERCP) STENT REMOVAL BILIARY STENT PLACEMENT     Patient location during evaluation: PACU Anesthesia Type: MAC Level of consciousness: awake and alert Pain management: pain level controlled Vital Signs Assessment: post-procedure vital signs reviewed and stable Respiratory status: spontaneous breathing, nonlabored ventilation and respiratory function stable Cardiovascular status: blood pressure returned to baseline and stable Postop Assessment: no apparent nausea or vomiting Anesthetic complications: no   No notable events documented.  Last Vitals:  Vitals:   04/06/23 0539 04/06/23 0927  BP: 116/76 124/75  Pulse: 84 85  Resp: 17 20  Temp: (!) 36.3 C (!) 35.6 C  SpO2: 100% 99%    Last Pain:  Vitals:   04/06/23 0927  TempSrc: Oral  PainSc:    Pain Goal: Patients Stated Pain Goal: 1 (04/05/23 1847)                 Lowella Curb

## 2023-04-06 NOTE — Plan of Care (Signed)
  Problem: Activity: Goal: Risk for activity intolerance will decrease Outcome: Progressing   Problem: Pain Management: Goal: General experience of comfort will improve Outcome: Progressing   Problem: Safety: Goal: Ability to remain free from injury will improve Outcome: Progressing

## 2023-04-07 ENCOUNTER — Telehealth: Payer: Self-pay | Admitting: Pharmacist

## 2023-04-07 ENCOUNTER — Encounter (HOSPITAL_COMMUNITY): Payer: Self-pay | Admitting: Gastroenterology

## 2023-04-07 ENCOUNTER — Other Ambulatory Visit (HOSPITAL_COMMUNITY): Payer: Self-pay

## 2023-04-07 ENCOUNTER — Other Ambulatory Visit: Payer: Self-pay | Admitting: Pharmacy Technician

## 2023-04-07 ENCOUNTER — Other Ambulatory Visit: Payer: Self-pay

## 2023-04-07 ENCOUNTER — Telehealth: Payer: Self-pay

## 2023-04-07 ENCOUNTER — Telehealth: Payer: Self-pay | Admitting: Pharmacy Technician

## 2023-04-07 LAB — CULTURE, BLOOD (ROUTINE X 2)
Culture: NO GROWTH
Special Requests: ADEQUATE

## 2023-04-07 NOTE — Telephone Encounter (Signed)
Oral Chemotherapy Pharmacist Encounter  I spoke with patient for overview of: Promacta (eltrombopag) for the treatment of thrombocytopenia secondary to chemotherapy, planned duration until stabilization of platelets.   Counseled patient on administration, dosing, side effects, monitoring, drug-food interactions, safe handling, storage, and disposal.  Patient will take Promacta 75mg  tablets, 1 tablet by mouth once daily on an empty stomach, 1 hour before or 2 hours after a meal.  Patient aware that if her platelets reach 200K/uL or higher that she will hold the Promacta at that time.   Patient counseled to administer Promacta at least 2 hours before, or 4 hours after antacids, foods high in calcium, or vitamin supplements (iron, calcium, aluminum, magnesium, selinium, zinc).  Promacta start date: 04/10/23 (patient will pick up medicine from [facility] on 04/11/23)  Adverse effects include but are not limited to: fatigue, diarrhea, nausea, hepatotoxicity, myalgias.    Reviewed with patient importance of keeping a medication schedule and plan for any missed doses. No barriers to medication adherence identified.  Medication reconciliation performed and medication/allergy list updated.  All questions answered.  Ms. Bedinger voiced understanding and appreciation.   Medication education handout placed in mail for patient. Patient knows to call the office with questions or concerns. Oral Chemotherapy Clinic phone number provided to patient.   Lenord Carbo, PharmD, BCPS, [facility] Hematology/Oncology Clinical Pharmacist [facility] and [facility] Oral Chemotherapy Navigation Clinics [phone] 04/07/2023 1:33 PM

## 2023-04-07 NOTE — Progress Notes (Signed)
Oral Chemotherapy Pharmacist Encounter  Patient was counseled under telephone encounter from 04/07/23.  Lenord Carbo, PharmD, BCPS, [facility] Hematology/Oncology Clinical Pharmacist [facility] and [facility] Oral Chemotherapy Navigation Clinics [phone] 04/07/2023 1:37 PM

## 2023-04-07 NOTE — Telephone Encounter (Signed)
Oral Oncology Pharmacist Encounter  Received new prescription for Promacta (eltrombopag) for the treatment of thrombocytopenia secondary to chemotherapy, planned duration until stabilization of platelets.  CBC w/ Diff and CMP from 04/04/23 assessed, patient with platelet count of 97 K/uL - per Dr. Mosetta Putt patient will take Promacta until platelets are >= 200K/uL, and hold at that point. Patient T. Bili 1.3 mg/dL, but AST/ALT WNL. Recommend close monitoring of LFTs after Promacta initiation. Prescription dose and frequency assessed for appropriateness.  Current medication list in Epic reviewed, DDIs with Promacta identified: Category D drug-drug interaction between Promacta and Caltrate, Ferrous sulfate, and magnesium - Promacta will need to be taken either 2 hours before or 4 hours after patient takes vitamins to prevent decreased Promacta efficacy. Will educate patient on this.   Evaluated chart and no patient barriers to medication adherence noted.   Patient will use 1 time free voucher for fill of Promacta as insurance will not approve Promacta for thrombocytopenia secondary to chemotherapy.   Oral Oncology Clinic will continue to follow for insurance authorization, copayment issues, initial counseling and start date.  Lenord Carbo, PharmD, BCPS, [facility] Hematology/Oncology Clinical Pharmacist [facility] and [facility] Oral Chemotherapy Navigation Clinics [phone] 04/07/2023 8:48 AM

## 2023-04-07 NOTE — Progress Notes (Signed)
Specialty Pharmacy Initial Fill Coordination Note  Nicole Johnson is a 62 y.o. female contacted today regarding refills of specialty medication(s) Eltrombopag Olamine .  Patient requested [name] at [facility] [place]  on 04/08/23   Medication will be filled on 04/08/23.   Patient is aware of $0 copayment.

## 2023-04-07 NOTE — Telephone Encounter (Signed)
Oral Oncology Patient Advocate Encounter   Was successful in obtaining a free 30 day voucher for Promacta.  This copay card will make the patients copay $0 for a single 30 day supply.  I have spoken with the patient.    The billing information is as follows and has been shared with [facility].   RxBin: [id number] PCN: [id number] Member ID: [id number] Group ID: [id number]   Jinger Neighbors, CPhT-Adv Oncology Pharmacy Patient Advocate [facility] Direct Number: [phone]  Fax: [phone]

## 2023-04-07 NOTE — Transitions of Care (Post Inpatient/ED Visit) (Signed)
   04/07/2023  Name: Nicole Johnson MRN: [id number] DOB: 1960-10-04  Today's TOC FU Call Status: Today's TOC FU Call Status:: Unsuccessful Call (1st Attempt) Unsuccessful Call (1st Attempt) Date: 04/07/23  Attempted to reach the patient regarding the most recent Inpatient/ED visit.  Follow Up Plan: Additional outreach attempts will be made to reach the patient to complete the Transitions of Care (Post Inpatient/ED visit) call.   Susa Loffler , BSN, RN Care Management Coordinator [place]   [facility] [email or web address] Direct Dial: [phone]

## 2023-04-08 ENCOUNTER — Telehealth: Payer: Self-pay

## 2023-04-08 ENCOUNTER — Ambulatory Visit (INDEPENDENT_AMBULATORY_CARE_PROVIDER_SITE_OTHER): Payer: BC Managed Care – PPO | Admitting: Primary Care

## 2023-04-08 ENCOUNTER — Other Ambulatory Visit: Payer: Self-pay | Admitting: Primary Care

## 2023-04-08 ENCOUNTER — Other Ambulatory Visit: Payer: Self-pay

## 2023-04-08 ENCOUNTER — Encounter: Payer: Self-pay | Admitting: Primary Care

## 2023-04-08 VITALS — BP 138/76 | HR 64 | Temp 97.5°F | Ht 65.0 in | Wt 171.0 lb

## 2023-04-08 DIAGNOSIS — K810 Acute cholecystitis: Secondary | ICD-10-CM | POA: Diagnosis not present

## 2023-04-08 DIAGNOSIS — R600 Localized edema: Secondary | ICD-10-CM

## 2023-04-08 DIAGNOSIS — C250 Malignant neoplasm of head of pancreas: Secondary | ICD-10-CM

## 2023-04-08 DIAGNOSIS — D696 Thrombocytopenia, unspecified: Secondary | ICD-10-CM

## 2023-04-08 LAB — CBC
HCT: 32 % — ABNORMAL LOW (ref 36.0–46.0)
Hemoglobin: 10.4 g/dL — ABNORMAL LOW (ref 12.0–15.0)
MCHC: 32.6 g/dL (ref 30.0–36.0)
MCV: 86 fL (ref 78.0–100.0)
Platelets: 159 10*3/uL (ref 150.0–400.0)
RBC: 3.72 Mil/uL — ABNORMAL LOW (ref 3.87–5.11)
RDW: 16.6 % — ABNORMAL HIGH (ref 11.5–15.5)
WBC: 4 10*3/uL (ref 4.0–10.5)

## 2023-04-08 LAB — COMPREHENSIVE METABOLIC PANEL
ALT: 25 U/L (ref 0–35)
AST: 22 U/L (ref 0–37)
Albumin: 3.1 g/dL — ABNORMAL LOW (ref 3.5–5.2)
Alkaline Phosphatase: 101 U/L (ref 39–117)
BUN: 11 mg/dL (ref 6–23)
CO2: 30 meq/L (ref 19–32)
Calcium: 8.7 mg/dL (ref 8.4–10.5)
Chloride: 99 meq/L (ref 96–112)
Creatinine, Ser: 0.67 mg/dL (ref 0.40–1.20)
GFR: 94.34 mL/min (ref >60.00)
Glucose, Bld: 151 mg/dL — ABNORMAL HIGH (ref 70–99)
Potassium: 3.3 meq/L — ABNORMAL LOW (ref 3.5–5.1)
Sodium: 137 meq/L (ref 135–145)
Total Bilirubin: 0.6 mg/dL (ref 0.2–1.2)
Total Protein: 6 g/dL (ref 6.0–8.3)

## 2023-04-08 LAB — CULTURE, BLOOD (ROUTINE X 2)
Culture: NO GROWTH
Special Requests: ADEQUATE

## 2023-04-08 LAB — BRAIN NATRIURETIC PEPTIDE: Pro B Natriuretic peptide (BNP): 93 pg/mL (ref 0.0–100.0)

## 2023-04-08 MED ORDER — FUROSEMIDE 20 MG PO TABS: ORAL_TABLET | ORAL | 0 refills | Status: DC

## 2023-04-08 MED ORDER — POTASSIUM CHLORIDE CRYS ER 20 MEQ PO TBCR: 20.0000 meq | EXTENDED_RELEASE_TABLET | Freq: Two times a day (BID) | ORAL | 0 refills | Status: DC

## 2023-04-08 NOTE — Progress Notes (Signed)
Subjective:    Patient ID: Nicole Johnson, female    DOB: May 07, 1961, 62 y.o.   MRN: [id number]  HPI  Nicole Johnson is a very pleasant 62 y.o. female patient of Dr. Milinda Antis with a history of pancreatic cancer, thrombocytopenia, vitamin B12 deficiency, prediabetes, hyponatremia who presents today for hospital follow-up and to discuss lower extremity edema.  She presented to [facility], ED on 04/02/2023 for nausea, vomiting, generalized abdominal pain, poor appetite, and fatigue since ERCP for metal stent exchange on 03/30/2023. She also missed a few chemotherapy appointments due to thrombocytopenia.  Workup in the ED revealed normal WBC, thrombocytopenia with platelets at 86,000, normal electrolytes and liver function, lipase of 22.  She underwent CT abdomen pelvis which revealed possible acute cholecystitis.  She was admitted for further evaluation.  During her hospital stay general surgery consulted who recommended conservative therapy with antibiotic treatment, IV fluids, pain medication.  GI consulted who completed ERCP for exchange of the biliary stent.  Symptoms improved.  She was discharged home on 04/06/2023 with recommendations for oncology follow-up.  She was treated with an additional course of Augmentin x 7 days.  Since her hospital discharge she's noticed bilateral lower extremity edema from her distal knees through her toes. She believes she had "14 bags of fluids" during her hospital stay.  She did not experience swelling during her hospital stay.  She has not been elevating her legs. She is mostly sedentary but is working to get up every few hours. Yesterday she rode around with her husband all day yesterday in the car. Last night she slept in a recliner due to her back pain. .  She is drinking little fluids because she doesn't want to make her swelling worse. She has urinated a few times yesterday, but little came out. She saw clear yellow urine. She has eaten very little over the last  week. She has noticed slight exertional dyspnea and cough.   She will start radiation next week. She continues to take her Augmentin BID as prescribed.    Review of Systems  Constitutional:  Positive for fatigue. Negative for fever.  Respiratory:  Positive for cough and shortness of breath.   Cardiovascular:  Positive for leg swelling.         Past Medical History:  Diagnosis Date   Arthritis    GERD (gastroesophageal reflux disease)    PONV (postoperative nausea and vomiting)    Prediabetes     Social History   Socioeconomic History   Marital status: Married    Spouse name: Not on file   Number of children: 1   Years of education: Not on file   Highest education level: Not on file  Occupational History   Not on file  Tobacco Use   Smoking status: Never   Smokeless tobacco: Never  Substance and Sexual Activity   Alcohol use: Yes    Comment: occ   Drug use: Never   Sexual activity: Yes    Partners: Male  Other Topics Concern   Not on file  Social History Narrative   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    Son is 82 yo and lives with them     Social Determinants of Health   Financial Resource Strain: Not on file  Food Insecurity: No Food Insecurity (04/08/2023)   Hunger Vital Sign    Worried About Running Out of Food in the Last Year: Never true    Ran Out of Food in  the Last Year: Never true  Transportation Needs: No Transportation Needs (04/08/2023)   PRAPARE - Administrator, Civil Service (Medical): No    Lack of Transportation (Non-Medical): No  Physical Activity: Not on file  Stress: Not on file  Social Connections: Not on file  Intimate Partner Violence: Not At Risk (04/08/2023)   Humiliation, Afraid, Rape, and Kick questionnaire    Fear of Current or Ex-Partner: No    Emotionally Abused: No    Physically Abused: No    Sexually Abused: No    Past Surgical History:  Procedure Laterality Date   BILIARY BRUSHING  12/17/2022    Procedure: BILIARY BRUSHING;  Surgeon: Jeani Hawking, MD;  Location: [facility];  Service: Gastroenterology;;   BILIARY STENT PLACEMENT N/A 12/17/2022   Procedure: BILIARY STENT PLACEMENT;  Surgeon: Jeani Hawking, MD;  Location: [facility];  Service: Gastroenterology;  Laterality: N/A;   BILIARY STENT PLACEMENT N/A 04/05/2023   Procedure: BILIARY STENT PLACEMENT;  Surgeon: Jeani Hawking, MD;  Location: [facility];  Service: Gastroenterology;  Laterality: N/A;   ERCP N/A 12/17/2022   Procedure: ENDOSCOPIC RETROGRADE CHOLANGIOPANCREATOGRAPHY (ERCP);  Surgeon: Jeani Hawking, MD;  Location: [facility];  Service: Gastroenterology;  Laterality: N/A;   ERCP N/A 04/05/2023   Procedure: ENDOSCOPIC RETROGRADE CHOLANGIOPANCREATOGRAPHY (ERCP);  Surgeon: Jeani Hawking, MD;  Location: [facility];  Service: Gastroenterology;  Laterality: N/A;   ESOPHAGOGASTRODUODENOSCOPY (EGD) WITH PROPOFOL N/A 12/18/2022   Procedure: ESOPHAGOGASTRODUODENOSCOPY (EGD) WITH PROPOFOL;  Surgeon: Jeani Hawking, MD;  Location: [facility];  Service: Gastroenterology;  Laterality: N/A;   ESOPHAGOGASTRODUODENOSCOPY (EGD) WITH PROPOFOL N/A 12/30/2022   Procedure: ESOPHAGOGASTRODUODENOSCOPY (EGD) WITH PROPOFOL;  Surgeon: Jeani Hawking, MD;  Location: [facility] ENDOSCOPY;  Service: Gastroenterology;  Laterality: N/A;   EUS N/A 12/18/2022   Procedure: UPPER ENDOSCOPIC ULTRASOUND (EUS) LINEAR;  Surgeon: Jeani Hawking, MD;  Location: [facility];  Service: Gastroenterology;  Laterality: N/A;   FINE NEEDLE ASPIRATION N/A 12/18/2022   Procedure: FINE NEEDLE ASPIRATION (FNA) LINEAR;  Surgeon: Jeani Hawking, MD;  Location: [facility];  Service: Gastroenterology;  Laterality: N/A;   FINE NEEDLE ASPIRATION  12/30/2022   Procedure: FINE NEEDLE ASPIRATION (FNA) LINEAR;  Surgeon: Jeani Hawking, MD;  Location: [facility] ENDOSCOPY;  Service: Gastroenterology;;   LAPAROSCOPIC ABDOMINAL EXPLORATION     SPHINCTEROTOMY  12/17/2022   Procedure: Dennison Mascot;   Surgeon: Jeani Hawking, MD;  Location: [facility];  Service: Gastroenterology;;   Francine Graven REMOVAL  04/05/2023   Procedure: STENT REMOVAL;  Surgeon: Jeani Hawking, MD;  Location: [facility];  Service: Gastroenterology;;   UPPER ESOPHAGEAL ENDOSCOPIC ULTRASOUND (EUS) N/A 12/30/2022   Procedure: UPPER ESOPHAGEAL ENDOSCOPIC ULTRASOUND (EUS);  Surgeon: Jeani Hawking, MD;  Location: [facility] END[facility]COPY;  Service: Gastroenterology;  Laterality: N/A;    Family History  Problem Relation Age of Onset   Diabetes Mother    Stroke Mother    Lung cancer Father 43       smoked   Diabetes Father    Diabetes Sister    Breast cancer Sister 68   Diabetes Brother    Cancer Brother 60       neck cancer   Heart disease Maternal Grandmother    Breast cancer Maternal Grandmother        dx. <50, double mastectomy   Heart disease Maternal Grandfather     Allergies  Allergen Reactions   Other Nausea Only and Other (See Comments)    Certain types of Anesthesia cause SEVERE NAUSEA   Codeine Nausea Only   Meloxicam Nausea  Only   Tramadol Nausea Only    Current Outpatient Medications on File Prior to Visit  Medication Sig Dispense Refill   ADVIL 200 MG CAPS Take 400-600 mg by mouth every 6 (six) hours as needed (for pain).     amoxicillin-clavulanate (AUGMENTIN) 875-125 MG tablet Take 1 tablet by mouth 2 (two) times daily for 7 days. 14 tablet 0   Calcium Carb-Cholecalciferol (CALTRATE 600+D3 PO) Take 1 tablet by mouth daily with breakfast.     capecitabine (XELODA) 500 MG tablet Take by mouth.     CREON 24000-76000 units CPEP Take 1 capsule by mouth before each meal, and take 1 capsule before snacks.     eltrombopag (PROMACTA) 75 MG tablet Take 1 tablet (75 mg total) by mouth daily. Take on an empty stomach 1 hour before meals or 2 hours after. 30 tablet 0   Ferrous Sulfate (IRON) 325 (65 Fe) MG TABS Take 325 mg by mouth daily with breakfast.     fluticasone (FLONASE) 50 MCG/ACT nasal spray Place 2 sprays into  both nostrils daily. (Patient not taking: Reported on 04/08/2023) 16 g 6   MAGNESIUM PO Take 1 tablet by mouth daily.     multivitamin-lutein (OCUVITE-LUTEIN) CAPS capsule Take 1 capsule by mouth daily.     NON FORMULARY Apply 1 application  topically See admin instructions. Hot Cream  Muscle & Joint Cream/Capsaicin- based- Apply to painful or itchy areas as directed/as needed for relief     omeprazole (PRILOSEC) 20 MG capsule TAKE 1 CAPSULE(20 MG) BY MOUTH DAILY (Patient taking differently: Take 20 mg by mouth 2 (two) times daily before a meal.) 90 capsule 1   ondansetron (ZOFRAN) 8 MG tablet Take 8 mg by mouth every 8 (eight) hours as needed for nausea or vomiting.     Pyridoxine HCl (VITAMIN B6 PO) Take 1 tablet by mouth daily.     triamcinolone cream (KENALOG) 0.1 % Apply 1 Application topically 2 (two) times daily as needed. 45 g 1   TURMERIC PO Take 2 capsules by mouth daily.     vitamin B-12 (CYANOCOBALAMIN) 500 MCG tablet Take 500 mcg by mouth daily. (Patient not taking: Reported on 04/08/2023)     HYDROcodone-acetaminophen (NORCO/VICODIN) 5-325 MG tablet Take 1 tablet by mouth every 4 (four) hours as needed for moderate pain (pain score 4-6). (Patient not taking: Reported on 04/08/2023) 20 tablet 0   No current facility-administered medications on file prior to visit.    BP 138/76   Pulse 64   Temp (!) 97.5 F (36.4 C) (Temporal)   Ht 5\' 5"  (1.651 m)   Wt 171 lb (77.6 kg)   LMP 01/11/2011   SpO2 97%   BMI 28.46 kg/m  Objective:   Physical Exam Cardiovascular:     Rate and Rhythm: Normal rate and regular rhythm.     Comments: Moderate to severe bilateral lower extremity edema to bilateral lower extremities distal to knee through toes.  No skin color changes.  No open wounds. Pulmonary:     Effort: Pulmonary effort is normal.     Breath sounds: Normal breath sounds.  Abdominal:     General: There is no distension.  Musculoskeletal:     Cervical back: Neck supple.      Right lower leg: 1+ Pitting Edema present.     Left lower leg: 1+ Pitting Edema present.  Skin:    General: Skin is warm and dry.  Neurological:     Mental Status: She is alert  and oriented to person, place, and time.  Psychiatric:        Mood and Affect: Mood normal.           Assessment & Plan:  Bilateral lower extremity edema Assessment & Plan: Noted on exam today.  Differentials include fluid volume overload, heart failure, other metabolic cause.  Lower suspicion for DVT given exam, but will keep on differential list. Exam today without evidence of pulmonary edema or ascites.  Reviewed CT chest from August 2024 which reveals normal heart size. Checking labs today including CMP, BNP, CBC.  We discussed to start elevating her legs anytime she is resting. Will discuss with PCP.  Consider low-dose furosemide daily x 3 to 5 days.  Will need to review labs first.  Orders: -     Brain natriuretic peptide  Thrombocytopenia (HCC) Assessment & Plan: Repeat CBC pending.  Orders: -     CBC  Malignant neoplasm of head of pancreas (HCC) -     Comprehensive metabolic panel  Acute cholecystitis Assessment & Plan: Improving. Recent hospital admission.  Hospital notes, labs, imaging reviewed.  Continue Augmentin twice daily as prescribed. She will follow-up with PCP if symptoms do not resolve/or progress.  CMP pending today.         Doreene Nest, NP

## 2023-04-08 NOTE — Transitions of Care (Post Inpatient/ED Visit) (Signed)
04/08/2023  Name: Nicole Johnson MRN: [id number] DOB: 1960/09/29  Today's TOC FU Call Status: Today's TOC FU Call Status:: Successful TOC FU Call Completed Unsuccessful Call (1st Attempt) Date: 04/07/23 [facility] FU Call Complete Date: 04/08/23 Patient's Name and Date of Birth confirmed.  Transition Care Management Follow-up Telephone Call Date of Discharge: 04/06/23 Discharge Facility: [facility]) Type of Discharge: Inpatient Admission Primary Inpatient Discharge Diagnosis:: Acute cholecystitis with cholelithiasis, recent ERCP and biliary stent exchange.  Stable LFTs. How have you been since you were released from the hospital?: Same Any questions or concerns?: Yes Patient Questions/Concerns:: LE Edema  Items Reviewed: Did you receive and understand the discharge instructions provided?: Yes Medications obtained,verified, and reconciled?: Yes (Medications Reviewed) Any new allergies since your discharge?: No Dietary orders reviewed?: Yes Type of Diet Ordered:: Reg heart healthy Low fat Do you have support at home?: Yes People in Home: spouse Name of Support/Comfort Primary Source: Spouse-Nicole Johnson  Medications Reviewed Today: Medications Reviewed Today     Reviewed by Nicole Barrios, RN (Registered Nurse) on 04/08/23 at 1037  Med List Status: <None>   Medication Order Taking? Sig Documenting Provider Last Dose Status Informant  ADVIL 200 MG CAPS [id number] Yes Take 400-600 mg by mouth every 6 (six) hours as needed (for pain). [provider] Taking Active Self  amoxicillin-clavulanate (AUGMENTIN) 875-125 MG tablet [id number] Yes Take 1 tablet by mouth 2 (two) times daily for 7 days. Dorcas Carrow, MD Taking Active   Calcium Carb-Cholecalciferol (CALTRATE 600+D3 PO) [id number] Yes Take 1 tablet by mouth daily with breakfast. [provider] Taking Active Self  capecitabine (XELODA) 500 MG tablet [id number] Yes Take by mouth. [provider] Taking Active  Self           Med Note Nicole Johnson, Nicole Johnson Apr 03, 2023 12:47 AM) Hasn't started  cetirizine (ZYRTEC) 10 MG chewable tablet [id number] Yes Chew 10 mg by mouth daily. As needed [provider] Taking Active   CREON 24000-76000 units CPEP [id number] Yes Take 1 capsule by mouth before each meal, and take 1 capsule before snacks. [provider] Taking Active Self  eltrombopag (PROMACTA) 75 MG tablet [id number] Yes Take 1 tablet (75 mg total) by mouth daily. Take on an empty stomach 1 hour before meals or 2 hours after. Nicole Mood, MD Taking Active   Ferrous Sulfate (IRON) 325 (65 Fe) MG TABS [id number] Yes Take 325 mg by mouth daily with breakfast. [provider] Taking Active Self  fluticasone (FLONASE) 50 MCG/ACT nasal spray [id number] No Place 2 sprays into both nostrils daily.  Patient not taking: Reported on 04/08/2023   Nicole Pimple, MD Not Taking Active Self  HYDROcodone-acetaminophen (NORCO/VICODIN) 5-325 MG tablet [id number] No Take 1 tablet by mouth every 4 (four) hours as needed for moderate pain (pain score 4-6).  Patient not taking: Reported on 04/08/2023   Dorcas Carrow, MD Not Taking Active   MAGNESIUM PO [id number] Yes Take 1 tablet by mouth daily. [provider] Taking Active Self           Med Note Nicole Johnson, Nicole Johnson Apr 03, 2023 12:48 AM)    multivitamin-lutein [facility]) CAPS capsule [id number] Yes Take 1 capsule by mouth daily. [provider] Taking Active Self  Clent Demark [id number] Yes Apply 1 application  topically See admin instructions. Hot Cream  Muscle & Joint Cream/Capsaicin- based- Apply to painful or itchy areas as directed/as needed for relief [provider]  Taking Active Self  omeprazole (PRILOSEC) 20 MG capsule [id number] Yes TAKE 1 CAPSULE(20 MG) BY MOUTH DAILY  Patient taking differently: Take 20 mg by mouth 2 (two) times daily before a meal.   Nicole Belfast, FNP Taking Active  Self  ondansetron (ZOFRAN) 8 MG tablet [id number] Yes Take 8 mg by mouth every 8 (eight) hours as needed for nausea or vomiting. [provider] Taking Active Self  Pyridoxine HCl (VITAMIN B6 PO) [id number] Yes Take 1 tablet by mouth daily. [provider] Taking Active Self           Med Note Nicole Johnson, Nicole Johnson Apr 03, 2023 12:49 AM)    triamcinolone cream (KENALOG) 0.1 % [id number] Yes Apply 1 Application topically 2 (two) times daily as needed. Nicole Schwalbe, MD Taking Active Self  TURMERIC PO [id number] Yes Take 2 capsules by mouth daily. [provider] Taking Active Self           Med Note Nicole Johnson, Nicole Johnson Apr 03, 2023 12:49 AM)    vitamin B-12 (CYANOCOBALAMIN) 500 MCG tablet [id number] No Take 500 mcg by mouth daily.  Patient not taking: Reported on 04/08/2023   [provider] Not Taking Active Self           Med Note Nicole Johnson, [facility]   Sun Apr 03, 2023 12:50 AM) On Hold.            Home Care and Equipment/Supplies: Were Home Health Services Ordered?: No Any new equipment or medical supplies ordered?: No  Functional Questionnaire: Do you need assistance with meal preparation?: No Do you need assistance with eating?: No Do you have difficulty maintaining continence: No Do you need assistance with getting out of bed/getting out of a chair/moving?: No Do you have difficulty managing or taking your medications?: No  Follow up appointments reviewed: PCP Follow-up appointment confirmed?: Yes Date of PCP follow-up appointment?: 04/08/23 Follow-up Provider: [facility] Follow-up appointment confirmed?: Yes Date of Specialist follow-up appointment?: 04/13/23 Follow-Up Specialty Provider:: Radiology 11/20 Oncology 11/22 Do you need transportation to your follow-up appointment?: No (Spouse drives) Do you understand care options if your condition(s) worsen?: Yes-patient verbalized  understanding  SDOH Interventions Today    Flowsheet Row Most Recent Value  SDOH Interventions   Food Insecurity Interventions Intervention Not Indicated  Housing Interventions Intervention Not Indicated  Transportation Interventions Intervention Not Indicated, Patient Resources (Friends/Family)  Utilities Interventions Intervention Not Indicated      Patient saw PCP today c/o BLE edema she [place] waiting for call back and believes she will start on a diuretic. She states she is going to begin daily radiation TX x 5 weeks and then will hopefully resume Chemo treatments . She continues on ABT    Discussed [facility]  TOC program and weekly calls to patient to assess condition/status, medication management  and provide support/education as indicated . Patient/ Caregiver voiced understanding and declined enrollment in the 30-day TOC Program.     The patient has been provided with contact information for the care management team and has been advised to call with any health related questions or concerns.    Susa Loffler , BSN, RN Care Management Coordinator [place]   [facility] [email or web address] Direct Dial: [phone]

## 2023-04-08 NOTE — Patient Instructions (Signed)
Stop by the lab prior to leaving today. I will notify you of your results once received.   I will be in touch again later today.  It was a pleasure meeting you!

## 2023-04-08 NOTE — Assessment & Plan Note (Signed)
Repeat CBC pending. 

## 2023-04-08 NOTE — Assessment & Plan Note (Addendum)
Noted on exam today.  Differentials include fluid volume overload, heart failure, other metabolic cause.  Lower suspicion for DVT given exam, but will keep on differential list. Exam today without evidence of pulmonary edema or ascites.  Reviewed CT chest from August 2024 which reveals normal heart size. Checking labs today including CMP, BNP, CBC.  We discussed to start elevating her legs anytime she is resting. Will discuss with PCP.  Consider low-dose furosemide daily x 3 to 5 days.  Will need to review labs first.

## 2023-04-08 NOTE — Telephone Encounter (Signed)
Spoke with pt regarding weekly CBC w/Diff labs.  Pt stated that Dr. Johny Blamer at [facility] already have the pt scheduled to do wkly labs on Fridays at [facility].  Stated Dr. Mosetta Putt would like to do a telephone visit with the pt in 3 to 6 wks to f/u on how the pt is doing.  Stated Dr. Latanya Maudlin scheduler will be in contact with pt to get her scheduled.  Pt verbalized understanding and had no further questions or concerns at this time.

## 2023-04-08 NOTE — Assessment & Plan Note (Signed)
Improving. Recent hospital admission.  Hospital notes, labs, imaging reviewed.  Continue Augmentin twice daily as prescribed. She will follow-up with PCP if symptoms do not resolve/or progress.  CMP pending today.

## 2023-04-09 ENCOUNTER — Other Ambulatory Visit (HOSPITAL_COMMUNITY): Payer: Self-pay

## 2023-04-12 DIAGNOSIS — C259 Malignant neoplasm of pancreas, unspecified: Secondary | ICD-10-CM | POA: Diagnosis not present

## 2023-04-13 DIAGNOSIS — C259 Malignant neoplasm of pancreas, unspecified: Secondary | ICD-10-CM | POA: Diagnosis not present

## 2023-04-14 DIAGNOSIS — C259 Malignant neoplasm of pancreas, unspecified: Secondary | ICD-10-CM | POA: Diagnosis not present

## 2023-04-14 DIAGNOSIS — Z510 Encounter for antineoplastic radiation therapy: Secondary | ICD-10-CM | POA: Diagnosis not present

## 2023-04-14 DIAGNOSIS — C250 Malignant neoplasm of head of pancreas: Secondary | ICD-10-CM | POA: Diagnosis not present

## 2023-04-15 DIAGNOSIS — C259 Malignant neoplasm of pancreas, unspecified: Secondary | ICD-10-CM | POA: Diagnosis not present

## 2023-04-15 DIAGNOSIS — Z87891 Personal history of nicotine dependence: Secondary | ICD-10-CM | POA: Diagnosis not present

## 2023-04-15 DIAGNOSIS — C250 Malignant neoplasm of head of pancreas: Secondary | ICD-10-CM | POA: Diagnosis not present

## 2023-04-15 DIAGNOSIS — Z510 Encounter for antineoplastic radiation therapy: Secondary | ICD-10-CM | POA: Diagnosis not present

## 2023-04-17 DIAGNOSIS — C259 Malignant neoplasm of pancreas, unspecified: Secondary | ICD-10-CM | POA: Diagnosis not present

## 2023-04-17 DIAGNOSIS — C250 Malignant neoplasm of head of pancreas: Secondary | ICD-10-CM | POA: Diagnosis not present

## 2023-04-18 DIAGNOSIS — C259 Malignant neoplasm of pancreas, unspecified: Secondary | ICD-10-CM | POA: Diagnosis not present

## 2023-04-19 DIAGNOSIS — Z510 Encounter for antineoplastic radiation therapy: Secondary | ICD-10-CM | POA: Diagnosis not present

## 2023-04-19 DIAGNOSIS — C259 Malignant neoplasm of pancreas, unspecified: Secondary | ICD-10-CM | POA: Diagnosis not present

## 2023-04-19 DIAGNOSIS — Z87891 Personal history of nicotine dependence: Secondary | ICD-10-CM | POA: Diagnosis not present

## 2023-04-19 DIAGNOSIS — R911 Solitary pulmonary nodule: Secondary | ICD-10-CM | POA: Diagnosis not present

## 2023-04-19 DIAGNOSIS — K769 Liver disease, unspecified: Secondary | ICD-10-CM | POA: Diagnosis not present

## 2023-04-19 DIAGNOSIS — C250 Malignant neoplasm of head of pancreas: Secondary | ICD-10-CM | POA: Diagnosis not present

## 2023-04-19 DIAGNOSIS — Z79899 Other long term (current) drug therapy: Secondary | ICD-10-CM | POA: Diagnosis not present

## 2023-04-19 DIAGNOSIS — K819 Cholecystitis, unspecified: Secondary | ICD-10-CM | POA: Diagnosis not present

## 2023-04-20 DIAGNOSIS — C259 Malignant neoplasm of pancreas, unspecified: Secondary | ICD-10-CM | POA: Diagnosis not present

## 2023-04-25 DIAGNOSIS — C259 Malignant neoplasm of pancreas, unspecified: Secondary | ICD-10-CM | POA: Diagnosis not present

## 2023-04-26 DIAGNOSIS — C259 Malignant neoplasm of pancreas, unspecified: Secondary | ICD-10-CM | POA: Diagnosis not present

## 2023-04-27 DIAGNOSIS — C259 Malignant neoplasm of pancreas, unspecified: Secondary | ICD-10-CM | POA: Diagnosis not present

## 2023-04-28 DIAGNOSIS — C259 Malignant neoplasm of pancreas, unspecified: Secondary | ICD-10-CM | POA: Diagnosis not present

## 2023-04-29 ENCOUNTER — Other Ambulatory Visit: Payer: Self-pay

## 2023-04-29 ENCOUNTER — Telehealth: Payer: Self-pay

## 2023-04-29 DIAGNOSIS — C250 Malignant neoplasm of head of pancreas: Secondary | ICD-10-CM | POA: Diagnosis not present

## 2023-04-29 DIAGNOSIS — Z87891 Personal history of nicotine dependence: Secondary | ICD-10-CM | POA: Diagnosis not present

## 2023-04-29 DIAGNOSIS — Z510 Encounter for antineoplastic radiation therapy: Secondary | ICD-10-CM | POA: Diagnosis not present

## 2023-04-29 DIAGNOSIS — C259 Malignant neoplasm of pancreas, unspecified: Secondary | ICD-10-CM | POA: Diagnosis not present

## 2023-04-29 DIAGNOSIS — D649 Anemia, unspecified: Secondary | ICD-10-CM | POA: Diagnosis not present

## 2023-04-29 NOTE — Telephone Encounter (Signed)
Pt called stating she had labs drawn today while at [facility] and her plt are 349.  Pt called to find out if Dr. Mosetta Putt wants the pt to continue taking the Promata.  Pt confirmed that she's taking 75mg  daily of Promacta.  Pt wants to know if Dr. Mosetta Putt wants her to stop taking the Promacta or does Dr. Mosetta Putt want to reduce the pt's dose of Promacta.  Informed pt that Dr. Mosetta Putt is out of the office on Fridays but this nurse will make Dr. Mosetta Putt aware of the pt's call and will f/u with pt on Monday, 05/02/2023.  Pt verbalized understanding and will await this nurses' return call.

## 2023-05-02 ENCOUNTER — Other Ambulatory Visit: Payer: Self-pay

## 2023-05-02 DIAGNOSIS — C259 Malignant neoplasm of pancreas, unspecified: Secondary | ICD-10-CM | POA: Diagnosis not present

## 2023-05-03 DIAGNOSIS — C259 Malignant neoplasm of pancreas, unspecified: Secondary | ICD-10-CM | POA: Diagnosis not present

## 2023-05-04 DIAGNOSIS — C259 Malignant neoplasm of pancreas, unspecified: Secondary | ICD-10-CM | POA: Diagnosis not present

## 2023-05-06 DIAGNOSIS — C250 Malignant neoplasm of head of pancreas: Secondary | ICD-10-CM | POA: Diagnosis not present

## 2023-05-06 DIAGNOSIS — Z79899 Other long term (current) drug therapy: Secondary | ICD-10-CM | POA: Diagnosis not present

## 2023-05-06 DIAGNOSIS — C259 Malignant neoplasm of pancreas, unspecified: Secondary | ICD-10-CM | POA: Diagnosis not present

## 2023-05-06 DIAGNOSIS — Z87891 Personal history of nicotine dependence: Secondary | ICD-10-CM | POA: Diagnosis not present

## 2023-05-06 DIAGNOSIS — K819 Cholecystitis, unspecified: Secondary | ICD-10-CM | POA: Diagnosis not present

## 2023-05-06 DIAGNOSIS — Z510 Encounter for antineoplastic radiation therapy: Secondary | ICD-10-CM | POA: Diagnosis not present

## 2023-05-09 DIAGNOSIS — C259 Malignant neoplasm of pancreas, unspecified: Secondary | ICD-10-CM | POA: Diagnosis not present

## 2023-05-10 DIAGNOSIS — C259 Malignant neoplasm of pancreas, unspecified: Secondary | ICD-10-CM | POA: Diagnosis not present

## 2023-05-11 DIAGNOSIS — C250 Malignant neoplasm of head of pancreas: Secondary | ICD-10-CM | POA: Diagnosis not present

## 2023-05-11 DIAGNOSIS — C259 Malignant neoplasm of pancreas, unspecified: Secondary | ICD-10-CM | POA: Diagnosis not present

## 2023-05-12 DIAGNOSIS — C259 Malignant neoplasm of pancreas, unspecified: Secondary | ICD-10-CM | POA: Diagnosis not present

## 2023-05-13 DIAGNOSIS — Z87891 Personal history of nicotine dependence: Secondary | ICD-10-CM | POA: Diagnosis not present

## 2023-05-13 DIAGNOSIS — Z79899 Other long term (current) drug therapy: Secondary | ICD-10-CM | POA: Diagnosis not present

## 2023-05-13 DIAGNOSIS — C259 Malignant neoplasm of pancreas, unspecified: Secondary | ICD-10-CM | POA: Diagnosis not present

## 2023-05-13 DIAGNOSIS — D630 Anemia in neoplastic disease: Secondary | ICD-10-CM | POA: Diagnosis not present

## 2023-05-13 DIAGNOSIS — C250 Malignant neoplasm of head of pancreas: Secondary | ICD-10-CM | POA: Diagnosis not present

## 2023-05-13 DIAGNOSIS — R110 Nausea: Secondary | ICD-10-CM | POA: Diagnosis not present

## 2023-05-13 DIAGNOSIS — K8681 Exocrine pancreatic insufficiency: Secondary | ICD-10-CM | POA: Diagnosis not present

## 2023-05-13 DIAGNOSIS — Z923 Personal history of irradiation: Secondary | ICD-10-CM | POA: Diagnosis not present

## 2023-05-13 DIAGNOSIS — Z510 Encounter for antineoplastic radiation therapy: Secondary | ICD-10-CM | POA: Diagnosis not present

## 2023-05-16 DIAGNOSIS — C259 Malignant neoplasm of pancreas, unspecified: Secondary | ICD-10-CM | POA: Diagnosis not present

## 2023-05-16 DIAGNOSIS — Z510 Encounter for antineoplastic radiation therapy: Secondary | ICD-10-CM | POA: Diagnosis not present

## 2023-05-16 DIAGNOSIS — C250 Malignant neoplasm of head of pancreas: Secondary | ICD-10-CM | POA: Diagnosis not present

## 2023-05-17 DIAGNOSIS — C259 Malignant neoplasm of pancreas, unspecified: Secondary | ICD-10-CM | POA: Diagnosis not present

## 2023-05-17 DIAGNOSIS — C250 Malignant neoplasm of head of pancreas: Secondary | ICD-10-CM | POA: Diagnosis not present

## 2023-05-19 NOTE — Assessment & Plan Note (Addendum)
cT2N0M0, stage IB, unresectable  -Patient presented with jaundice, abdominal MRI showed a poorly differentiated hypervascular area in the head of pancreas, measuring 3.5 x 3.0 cm.  The mass unfortunately invades multiple vascular structures including celiac artery, the proximal SMA and SMV -Status post ERCP and biliary stent placement. Dr. Elnoria Howard performed EUS and fine-needle biopsy of the pancreatic mass twice, and the second biopsy confirmed adenocarcinoma.  -Unfortunately her pancreatic mass is not resectable due to the invading celiac artery and SMA, case was discussed in GI tumor conference few days ago.  She also has a small lesion in the liver on the recent CT scan which was not seen on the previous MRI.  Plan to repeat MRI in 3 months.  I reviewed the above with patient and her husband in detail today. -We discussed the incurable nature of her cancer, and treatment options, she includes chemotherapy and radiation. -She has good performance status, I recommend NALIRIFOX as first-line chemotherapy.  We discussed chemotherapy for 3 to 6 months, followed by SBRT radiation, or continue chemotherapy until disease progression. -Chemotherapy consent: Side effects including but does not not limited to, fatigue, nausea, vomiting, diarrhea, hair loss, neuropathy, fluid retention, renal and kidney dysfunction, neutropenic fever, needed for blood transfusion, bleeding, were discussed with patient in great detail. She agrees to proceed. -She understands the goal of chemotherapy is palliative -Plan to start in 1 to 2 weeks. The patient had 2nd opinion at [facility]. Reviewed most recent progress note from [facility] from 05/13/2023.  - Concurrent chemoradiation with capecitabine started 04/13/23 - Anticipated completion 05/19/23 (XRT off 12/25) - Understands to take cape pills on all days she receives radiation - Report any symptoms of hand-foot syndrome or diarrhea promptly. - RTC 5-6 wks labs, CT, visit Anemia:  Will get T/S and ABO on Monday and transfuse Mon.  TCP concern for ITP: Saw Dr. Mosetta Putt at [facility], and started on TPO mimetic, Promacta 75 mg daily. This was stopped since her platelets had improved. Platelets are lower now at 104. Will likely need to go back on this

## 2023-05-19 NOTE — Progress Notes (Unsigned)
I connected with Nicole Johnson on 05/19/23 at 10:00 AM EST by telephone and verified that I am speaking with the correct person using two identifiers.   I discussed the limitations, risks, security and privacy concerns of performing an evaluation and management service by telemedicine and the availability of in-person appointments. I also discussed with the patient that there may be a patient responsible charge related to this service. The patient expressed understanding and agreed to proceed.   Other persons participating in the visit and their role in the encounter: patient's husband and son    Patient's location: home   Provider's location: [facility]   Chief Complaint: ***   Patient Care Team: Johnson, Nicole Gallus, MD as PCP - General (Family Medicine) Nicole Mood, MD as Consulting Physician (Hematology and Oncology)  Clinic Day:  05/19/2023  Referring physician: Tower, Nicole Gallus, MD  ASSESSMENT & PLAN:   Assessment & Plan: Pancreatic cancer (HCC) cT2N0M0, stage IB, unresectable  -Patient presented with jaundice, abdominal MRI showed a poorly differentiated hypervascular area in the head of pancreas, measuring 3.5 x 3.0 cm.  The mass unfortunately invades multiple vascular structures including celiac artery, the proximal SMA and SMV -Status post ERCP and biliary stent placement. Nicole Johnson performed EUS and fine-needle biopsy of the pancreatic mass twice, and the second biopsy confirmed adenocarcinoma.  -Unfortunately her pancreatic mass is not resectable due to the invading celiac artery and SMA, case was discussed in GI tumor conference few days ago.  She also has a small lesion in the liver on the recent CT scan which was not seen on the previous MRI.  Plan to repeat MRI in 3 months.  I reviewed the above with patient and her husband in detail today. -We discussed the incurable nature of her cancer, and treatment options, she includes chemotherapy and radiation. -She has good performance status, I  recommend NALIRIFOX as first-line chemotherapy.  We discussed chemotherapy for 3 to 6 months, followed by SBRT radiation, or continue chemotherapy until disease progression. -Chemotherapy consent: Side effects including but does not not limited to, fatigue, nausea, vomiting, diarrhea, hair loss, neuropathy, fluid retention, renal and kidney dysfunction, neutropenic fever, needed for blood transfusion, bleeding, were discussed with patient in great detail. She agrees to proceed. -She understands the goal of chemotherapy is palliative -Plan to start in 1 to 2 weeks. The patient had 2nd opinion at [facility]. Reviewed most recent progress note from [facility]ology from 05/13/2023.  - Concurrent chemoradiation with capecitabine started 04/13/23 - Anticipated completion 05/19/23 (XRT off 12/25) - Understands to take cape pills on all days she receives radiation - Report any symptoms of hand-foot syndrome or diarrhea promptly. - RTC 5-6 wks labs, CT, visit Anemia: Will get T/S and ABO on Monday and transfuse Mon.  TCP concern for ITP: Saw Dr. Mosetta Johnson at [facility], and started on TPO mimetic, Promacta 75 mg daily. This was stopped since her platelets had improved. Platelets are lower now at 104. Will likely need to go back on this    Plan: Most recent labs from [facility] reviewed with patient today.  -CBC showing WBC ***; Hgb ***; Hct ***; Plt ***; Anc *** -CMP - K ***; glucose ***; BUN ***; Creatinine ***; eGFR ***; Ca ***; LFTs ***.   -CA 19-9 -  The patient understands the plans discussed today and is in agreement with them.  She knows to contact our office if she develops concerns prior to her next appointment.  I provided ***  minutes of face-to-face time during this encounter and > 50% was spent counseling as documented under my assessment and plan.    Nicole Jews, NP  [facility] - A DEPT OF [facility] [address] [place] [address] Dept: [phone] Dept Fax: [phone]   No orders of the defined types were placed in this encounter.     CHIEF COMPLAINT:  CC: f/u pancreatic cancer   Current Treatment:  first line chemotherapy with NALIRIFOX every 2 weeks   INTERVAL HISTORY:  Nicole Johnson is here today for repeat clinical assessment. Last saw Dr. Mosetta Johnson on 01/14/2023. Immediate referral to IR was made for port placement. She was to have chemotherapy education class and begin treatment as soon as possible. She, has since, had 2nd opinion at [facility]. Recently completed chemoradiation therapy with Xeloda. Most recent visit note with radiation oncology at [facility] was reviewed. Final radiation was 05/17/2023.  She denies fevers or chills. She denies pain. Her appetite is good. Her weight {Weight change:10426}.  I have reviewed the past medical history, past surgical history, social history and family history with the patient and they are unchanged from previous note.  ALLERGIES:  is allergic to other, codeine, meloxicam, and tramadol.  MEDICATIONS:  Current Outpatient Medications  Medication Sig Dispense Refill   ADVIL 200 MG CAPS Take 400-600 mg by mouth every 6 (six) hours as needed (for pain).     Calcium Carb-Cholecalciferol (CALTRATE 600+D3 PO) Take 1 tablet by mouth daily with breakfast.     capecitabine (XELODA) 500 MG tablet Take by mouth.     cetirizine (ZYRTEC) 10 MG chewable tablet Chew 10 mg by mouth daily. As needed     CREON 24000-76000 units CPEP Take 1 capsule by mouth before each meal, and take 1 capsule before snacks.     eltrombopag (PROMACTA) 75 MG tablet Take 1 tablet (75 mg total) by mouth daily. Take on an empty stomach 1 hour before meals or 2 hours after. 30 tablet 0   Ferrous Sulfate (IRON) 325 (65 Fe) MG TABS Take 325 mg by mouth daily with breakfast.     fluticasone (FLONASE) 50 MCG/ACT nasal spray Place 2 sprays into both nostrils daily. (Patient not taking: Reported on  04/08/2023) 16 g 6   furosemide (LASIX) 20 MG tablet Take 1-2 tablets by mouth one daily in the morning for leg swelling x 5 days. 10 tablet 0   HYDROcodone-acetaminophen (NORCO/VICODIN) 5-325 MG tablet Take 1 tablet by mouth every 4 (four) hours as needed for moderate pain (pain score 4-6). (Patient not taking: Reported on 04/08/2023) 20 tablet 0   MAGNESIUM PO Take 1 tablet by mouth daily.     multivitamin-lutein (OCUVITE-LUTEIN) CAPS capsule Take 1 capsule by mouth daily.     NON FORMULARY Apply 1 application  topically See admin instructions. Hot Cream  Muscle & Joint Cream/Capsaicin- based- Apply to painful or itchy areas as directed/as needed for relief     omeprazole (PRILOSEC) 20 MG capsule TAKE 1 CAPSULE(20 MG) BY MOUTH DAILY (Patient taking differently: Take 20 mg by mouth 2 (two) times daily before a meal.) 90 capsule 1   ondansetron (ZOFRAN) 8 MG tablet Take 8 mg by mouth every 8 (eight) hours as needed for nausea or vomiting.     potassium chloride SA (KLOR-CON M) 20 MEQ tablet Take 1 tablet (20 mEq total) by mouth 2 (two) times daily. For low potassium. 10  tablet 0   Pyridoxine HCl (VITAMIN B6 PO) Take 1 tablet by mouth daily.     triamcinolone cream (KENALOG) 0.1 % Apply 1 Application topically 2 (two) times daily as needed. 45 g 1   TURMERIC PO Take 2 capsules by mouth daily.     vitamin B-12 (CYANOCOBALAMIN) 500 MCG tablet Take 500 mcg by mouth daily. (Patient not taking: Reported on 04/08/2023)     No current facility-administered medications for this visit.    HISTORY OF PRESENT ILLNESS:   Oncology History Overview Note   Cancer Staging  Pancreatic cancer [facility]) Staging form: Exocrine Pancreas, AJCC 8th Edition - Clinical: Stage IB (cT2, cN0, cM0) - Signed by Nicole Mood, MD on 01/06/2023 Total positive nodes: 0     Pancreatic cancer (HCC)  12/16/2022 Imaging   MR Abdomen MRCP W WO CONTAST   IMPRESSION: 1. Poorly defined infiltrative mass in the head of the pancreas  with vascular involvement, and occlusion of the bile ducts in the region of the porta hepatis, as detailed above. Findings are highly concerning for primary pancreatic malignancy. Further evaluation with endoscopic ultrasound and tissue sampling should be considered. 2. Severe dilatation of the gallbladder. Probable obstruction of the cystic duct in the region of the porta hepatis. Gallbladder wall does not appear thickened or edematous, and there is no surrounding pericholecystic fluid or overt surrounding inflammatory changes to clearly indicate an acute cholecystitis at this time.     12/30/2022 Pathology Results    FINAL MICROSCOPIC DIAGNOSIS:  A. ESOPHAGEAL, PANCREATIC HEAD MASS, FINE NEEDLE ASPIRATION:  - Adenocarcinoma    01/06/2023 Initial Diagnosis   Pancreatic cancer (HCC)   01/06/2023 Cancer Staging   Staging form: Exocrine Pancreas, AJCC 8th Edition - Clinical: Stage IB (cT2, cN0, cM0) - Signed by Nicole Mood, MD on 01/06/2023 Total positive nodes: 0   01/26/2023 - 01/26/2023 Chemotherapy   Patient is on Treatment Plan : PANCREAS NALIRIFOX D1, 15 Q28D      Genetic Testing   [facility] CancerNext-Expanded Panel+RNA was Negative. Report date is 01/21/2023.   The CancerNext-Expanded gene panel offered by [facility] and includes sequencing, rearrangement, and RNA analysis for the following 71 genes: AIP, ALK, APC, ATM, AXIN2, BAP1, BARD1, BMPR1A, BRCA1, BRCA2, BRIP1, CDC73, CDH1, CDK4, CDKN1B, CDKN2A, CHEK2, CTNNA1, DICER1, FH, FLCN, KIF1B, LZTR1, MAX, MEN1, MET, MLH1, MSH2, MSH3, MSH6, MUTYH, NF1, NF2, NTHL1, PALB2, PHOX2B, PMS2, POT1, PRKAR1A, PTCH1, PTEN, RAD51C, RAD51D, RB1, RET, SDHA, SDHAF2, SDHB, SDHC, SDHD, SMAD4, SMARCA4, SMARCB1, SMARCE1, STK11, SUFU, TMEM127, TP53, TSC1, TSC2, and VHL (sequencing and deletion/duplication); EGFR, EGLN1, HOXB13, KIT, MITF, PDGFRA, POLD1, and POLE (sequencing only); EPCAM and GREM1 (deletion/duplication only).        REVIEW OF SYSTEMS:    Constitutional: Denies fevers, chills or abnormal weight loss Eyes: Denies blurriness of vision Ears, nose, mouth, throat, and face: Denies mucositis or sore throat Respiratory: Denies cough, dyspnea or wheezes Cardiovascular: Denies palpitation, chest discomfort or lower extremity swelling Gastrointestinal:  Denies nausea, heartburn or change in bowel habits Skin: Denies abnormal skin rashes Lymphatics: Denies new lymphadenopathy or easy bruising Neurological:Denies numbness, tingling or new weaknesses Behavioral/Psych: Johnson is stable, no new changes  All other systems were reviewed with the patient and are negative.   VITALS:  Last menstrual period 01/11/2011.  Wt Readings from Last 3 Encounters:  04/08/23 171 lb (77.6 kg)  04/02/23 160 lb (72.6 kg)  03/14/23 158 lb (71.7 kg)    There is no height or weight on file to calculate  BMI.  Performance status (ECOG): {CHL ONC ZO:[id number]}  PHYSICAL EXAM:   GENERAL:alert, no distress and comfortable SKIN: skin color, texture, turgor are normal, no rashes or significant lesions EYES: normal, Conjunctiva are pink and non-injected, sclera clear OROPHARYNX:no exudate, no erythema and lips, buccal mucosa, and tongue normal  NECK: supple, thyroid normal size, non-tender, without nodularity LYMPH:  no palpable lymphadenopathy in the cervical, axillary or inguinal LUNGS: clear to auscultation and percussion with normal breathing effort HEART: regular rate & rhythm and no murmurs and no lower extremity edema ABDOMEN:abdomen soft, non-tender and normal bowel sounds Musculoskeletal:no cyanosis of digits and no clubbing  NEURO: alert & oriented x 3 with fluent speech, no focal motor/sensory deficits  LABORATORY DATA:  I have reviewed the data as listed    Component Value Date/Time   NA 137 04/08/2023 0928   K 3.3 (L) 04/08/2023 0928   CL 99 04/08/2023 0928   CO2 30 04/08/2023 0928   GLUCOSE 151 (H) 04/08/2023 0928   BUN 11  04/08/2023 0928   CREATININE 0.67 04/08/2023 0928   CREATININE 0.74 01/14/2023 0943   CALCIUM 8.7 04/08/2023 0928   PROT 6.0 04/08/2023 0928   ALBUMIN 3.1 (L) 04/08/2023 0928   AST 22 04/08/2023 0928   AST 25 01/14/2023 0943   ALT 25 04/08/2023 0928   ALT 29 01/14/2023 0943   ALKPHOS 101 04/08/2023 0928   BILITOT 0.6 04/08/2023 0928   BILITOT 1.1 01/14/2023 0943   GFRNONAA >60 04/04/2023 0425   GFRNONAA >60 01/14/2023 0943    No results found for: "SPEP", "UPEP"  Lab Results  Component Value Date   WBC 4.0 04/08/2023   NEUTROABS 5.9 04/04/2023   HGB 10.4 (L) 04/08/2023   HCT 32.0 (L) 04/08/2023   MCV 86.0 04/08/2023   PLT 159.0 04/08/2023      Chemistry      Component Value Date/Time   NA 137 04/08/2023 0928   K 3.3 (L) 04/08/2023 0928   CL 99 04/08/2023 0928   CO2 30 04/08/2023 0928   BUN 11 04/08/2023 0928   CREATININE 0.67 04/08/2023 0928   CREATININE 0.74 01/14/2023 0943      Component Value Date/Time   CALCIUM 8.7 04/08/2023 0928   ALKPHOS 101 04/08/2023 0928   AST 22 04/08/2023 0928   AST 25 01/14/2023 0943   ALT 25 04/08/2023 0928   ALT 29 01/14/2023 0943   BILITOT 0.6 04/08/2023 0928   BILITOT 1.1 01/14/2023 0943       RADIOGRAPHIC STUDIES: I have personally reviewed the radiological images as listed and agreed with the findings in the report. No results found.

## 2023-05-20 ENCOUNTER — Other Ambulatory Visit (HOSPITAL_COMMUNITY): Payer: Self-pay

## 2023-05-20 ENCOUNTER — Other Ambulatory Visit: Payer: Self-pay

## 2023-05-20 ENCOUNTER — Inpatient Hospital Stay: Payer: BC Managed Care – PPO | Attending: Nurse Practitioner | Admitting: Nurse Practitioner

## 2023-05-20 DIAGNOSIS — C250 Malignant neoplasm of head of pancreas: Secondary | ICD-10-CM

## 2023-05-20 MED ORDER — ELTROMBOPAG OLAMINE 75 MG PO TABS
75.0000 mg | ORAL_TABLET | Freq: Every day | ORAL | 0 refills | Status: DC
  Filled 2023-05-20: qty 30, 30d supply, fill #0

## 2023-05-23 ENCOUNTER — Telehealth: Payer: Self-pay | Admitting: Hematology

## 2023-05-27 ENCOUNTER — Encounter: Payer: Self-pay | Admitting: Nurse Practitioner

## 2023-06-01 DIAGNOSIS — R110 Nausea: Secondary | ICD-10-CM | POA: Diagnosis not present

## 2023-06-01 DIAGNOSIS — Z4659 Encounter for fitting and adjustment of other gastrointestinal appliance and device: Secondary | ICD-10-CM | POA: Diagnosis not present

## 2023-06-01 DIAGNOSIS — Z886 Allergy status to analgesic agent status: Secondary | ICD-10-CM | POA: Diagnosis not present

## 2023-06-01 DIAGNOSIS — Z8719 Personal history of other diseases of the digestive system: Secondary | ICD-10-CM | POA: Diagnosis not present

## 2023-06-01 DIAGNOSIS — K810 Acute cholecystitis: Secondary | ICD-10-CM | POA: Diagnosis not present

## 2023-06-01 DIAGNOSIS — K82A2 Perforation of gallbladder in cholecystitis: Secondary | ICD-10-CM | POA: Diagnosis not present

## 2023-06-01 DIAGNOSIS — R188 Other ascites: Secondary | ICD-10-CM | POA: Diagnosis not present

## 2023-06-01 DIAGNOSIS — M79605 Pain in left leg: Secondary | ICD-10-CM | POA: Diagnosis not present

## 2023-06-01 DIAGNOSIS — C801 Malignant (primary) neoplasm, unspecified: Secondary | ICD-10-CM | POA: Diagnosis not present

## 2023-06-01 DIAGNOSIS — Z885 Allergy status to narcotic agent status: Secondary | ICD-10-CM | POA: Diagnosis not present

## 2023-06-01 DIAGNOSIS — K819 Cholecystitis, unspecified: Secondary | ICD-10-CM | POA: Diagnosis not present

## 2023-06-01 DIAGNOSIS — K766 Portal hypertension: Secondary | ICD-10-CM | POA: Diagnosis not present

## 2023-06-01 DIAGNOSIS — Z87891 Personal history of nicotine dependence: Secondary | ICD-10-CM | POA: Diagnosis not present

## 2023-06-01 DIAGNOSIS — K831 Obstruction of bile duct: Secondary | ICD-10-CM | POA: Diagnosis not present

## 2023-06-01 DIAGNOSIS — M79604 Pain in right leg: Secondary | ICD-10-CM | POA: Diagnosis not present

## 2023-06-01 DIAGNOSIS — Z888 Allergy status to other drugs, medicaments and biological substances status: Secondary | ICD-10-CM | POA: Diagnosis not present

## 2023-06-01 DIAGNOSIS — M7989 Other specified soft tissue disorders: Secondary | ICD-10-CM | POA: Diagnosis not present

## 2023-06-01 DIAGNOSIS — E8809 Other disorders of plasma-protein metabolism, not elsewhere classified: Secondary | ICD-10-CM | POA: Diagnosis not present

## 2023-06-01 DIAGNOSIS — D649 Anemia, unspecified: Secondary | ICD-10-CM | POA: Diagnosis not present

## 2023-06-01 DIAGNOSIS — D638 Anemia in other chronic diseases classified elsewhere: Secondary | ICD-10-CM | POA: Diagnosis not present

## 2023-06-01 DIAGNOSIS — K838 Other specified diseases of biliary tract: Secondary | ICD-10-CM | POA: Diagnosis not present

## 2023-06-01 DIAGNOSIS — C250 Malignant neoplasm of head of pancreas: Secondary | ICD-10-CM | POA: Diagnosis not present

## 2023-06-01 DIAGNOSIS — Z8507 Personal history of malignant neoplasm of pancreas: Secondary | ICD-10-CM | POA: Diagnosis not present

## 2023-06-01 DIAGNOSIS — I864 Gastric varices: Secondary | ICD-10-CM | POA: Diagnosis not present

## 2023-06-02 DIAGNOSIS — I864 Gastric varices: Secondary | ICD-10-CM | POA: Diagnosis not present

## 2023-06-02 DIAGNOSIS — C250 Malignant neoplasm of head of pancreas: Secondary | ICD-10-CM | POA: Diagnosis not present

## 2023-06-02 DIAGNOSIS — K766 Portal hypertension: Secondary | ICD-10-CM | POA: Diagnosis not present

## 2023-06-02 DIAGNOSIS — K82A2 Perforation of gallbladder in cholecystitis: Secondary | ICD-10-CM | POA: Diagnosis not present

## 2023-06-08 ENCOUNTER — Telehealth: Payer: Self-pay

## 2023-06-08 NOTE — Transitions of Care (Post Inpatient/ED Visit) (Signed)
   06/08/2023  Name: ELEINA GONG MRN: [id number] DOB: [birth year]  Today's TOC FU Call Status: Today's TOC FU Call Status:: Unsuccessful Call (1st Attempt) Unsuccessful Call (1st Attempt) Date: 06/08/23  Attempted to reach the patient regarding the most recent Inpatient/ED visit.  Follow Up Plan: Additional outreach attempts will be made to reach the patient to complete the Transitions of Care (Post Inpatient/ED visit) call.   Gareld June, RN Medical illustrator [facility] [phone]

## 2023-06-08 NOTE — Transitions of Care (Post Inpatient/ED Visit) (Signed)
 06/08/2023  Name: Nicole KENERSON MRN: [id number] DOB: Aug 31, 1960  Today's TOC FU Call Status: Today's TOC FU Call Status:: Successful TOC FU Call Completed TOC FU Call Complete Date: 06/08/23 Patient's Name and Date of Birth confirmed.  Transition Care Management Follow-up Telephone Call Date of Discharge: 06/07/23 Name of Other (Non[facility]) Discharge Facility: [facility] Type of Discharge: Inpatient Admission Primary Inpatient Discharge Diagnosis:: Pancreatic Cancer How have you been since you were released from the hospital?: Better Any questions or concerns?: No  Items Reviewed: Did you receive and understand the discharge instructions provided?: Yes Medications obtained,verified, and reconciled?: Yes (Medications Reviewed) Any new allergies since your discharge?: No Dietary orders reviewed?: Yes Type of Diet Ordered:: Heart Helathy Do you have support at home?: Yes People in Home: spouse, grandchild(ren) Name of Support/Comfort Primary Source: Nicole Johnson  Medications Reviewed Today: Medications Reviewed Today     Reviewed by Claudene Crystal, RN (Case Manager) on 06/08/23 at 1145  Med List Status: <None>   Medication Order Taking? Sig Documenting Provider Last Dose Status Informant  ADVIL 200 MG CAPS [id number]  Take 400-600 mg by mouth every 6 (six) hours as needed (for pain). [provider]  Active Self  Calcium Carb-Cholecalciferol (CALTRATE 600+D3 PO) [id number] No Take 1 tablet by mouth daily with breakfast.  Patient not taking: Reported on 06/08/2023   [provider] Not Taking Active Self  capecitabine (XELODA) 500 MG tablet [id number]  Take by mouth. [provider]  Active Self           Med Note [name], [place] Apr 03, 2023 12:47 AM) Hasn't started  cetirizine (ZYRTEC) 10 MG chewable tablet [id number] No Chew 10 mg by mouth daily. As needed  Patient not taking: Reported on 06/08/2023   [provider] Not  Taking Active   CREON 24000-76000 units CPEP [id number]  Take 1 capsule by mouth before each meal, and take 1 capsule before snacks. [provider]  Active Self  eltrombopag  (PROMACTA ) 75 MG tablet [id number] No Take 1 tablet (75 mg total) by mouth daily. Take on an empty stomach 1 hour before meals or 2 hours after.  Patient not taking: Reported on 06/08/2023   Sharyon Deis, NP Not Taking Active   Ferrous Sulfate (IRON) 325 (65 Fe) MG TABS [id number] No Take 325 mg by mouth daily with breakfast.  Patient not taking: Reported on 06/08/2023   [provider] Not Taking Active Self  fluticasone  (FLONASE ) 50 MCG/ACT nasal spray [id number] No Place 2 sprays into both nostrils daily.  Patient not taking: Reported on 04/08/2023   Clemens Curt, MD Not Taking Active Self  furosemide  (LASIX ) 20 MG tablet [id number] No Take 1-2 tablets by mouth one daily in the morning for leg swelling x 5 days.  Patient not taking: Reported on 06/08/2023   [name], NP Not Taking Active   HYDROcodone -acetaminophen  (NORCO/VICODIN) 5-325 MG tablet [id number] No Take 1 tablet by mouth every 4 (four) hours as needed for moderate pain (pain score 4-6).  Patient not taking: Reported on 06/08/2023   Vada Garibaldi, MD Not Taking Active   MAGNESIUM PO [id number] No Take 1 tablet by mouth daily.  Patient not taking: Reported on 06/08/2023   [provider] Not Taking Active Self           Med Note [name], [place] Apr 03, 2023 12:48 AM)    multivitamin-lutein (OCUVITE-LUTEIN) CAPS capsule [id number] Yes Take  1 capsule by mouth daily. [provider] Taking Active Self  Nicole Johnson [id number]  Apply 1 application  topically See admin instructions. Hot Cream  Muscle & Joint Cream/Capsaicin- based- Apply to painful or itchy areas as directed/as needed for relief [provider]  Active Self  OLANZapine (ZYPREXA) 5 MG tablet [id number] Yes Take 2.5 mg by mouth at  bedtime. [provider]  Active   omeprazole  (PRILOSEC) 20 MG capsule [id number]  TAKE 1 CAPSULE(20 MG) BY MOUTH DAILY  Patient taking differently: Take 20 mg by mouth 2 (two) times daily before a meal.   [name], FNP  Active Self  ondansetron  (ZOFRAN ) 8 MG tablet [id number]  Take 8 mg by mouth every 8 (eight) hours as needed for nausea or vomiting. [provider]  Active Self  oxycodone  (OXY-IR) 5 MG capsule [id number] Yes Take 5 mg by mouth every 6 (six) hours as needed. [provider]  Active   potassium chloride  SA (KLOR-CON  M) 20 MEQ tablet [id number] No Take 1 tablet (20 mEq total) by mouth 2 (two) times daily. For low potassium.  Patient not taking: Reported on 06/08/2023   [name], NP Not Taking Active   Pyridoxine HCl (VITAMIN B6 PO) [id number] No Take 1 tablet by mouth daily.  Patient not taking: Reported on 06/08/2023   [provider] Not Taking Active Self           Med Note [name], [facility]   Sun Apr 03, 2023 12:49 AM)    triamcinolone  cream (KENALOG ) 0.1 % [id number] No Apply 1 Application topically 2 (two) times daily as needed.  Patient not taking: Reported on 06/08/2023   [name], MD Not Taking Active Self  TURMERIC PO [id number] No Take 2 capsules by mouth daily.  Patient not taking: Reported on 06/08/2023   [provider] Not Taking Active Self           Med Note [name], [facility]   Sun Apr 03, 2023 12:49 AM)    vitamin B-12 (CYANOCOBALAMIN ) 500 MCG tablet [id number]  Take 500 mcg by mouth daily.  Patient not taking: Reported on 04/08/2023   [provider]  Active Self           Med Note [name], [facility]   Sun Apr 03, 2023 12:50 AM) On Hold.            Home Care and Equipment/Supplies: Were Home Health Services Ordered?: Yes Name of Home Health Agency:: Unsure Has Agency set up a time to come to your home?: No EMR reviewed for Home Health Orders: Orders  present/patient has not received call (refer to CM for follow-up) Any new equipment or medical supplies ordered?: NA  Functional Questionnaire: Do you need assistance with bathing/showering or dressing?: No Do you need assistance with meal preparation?: No Do you need assistance with eating?: No Do you have difficulty maintaining continence: No Do you need assistance with getting out of bed/getting out of a chair/moving?: No Do you have difficulty managing or taking your medications?: No  Follow up appointments reviewed: PCP Follow-up appointment confirmed?: No MD Provider Line Number:[phone] Given: No Specialist Hospital Follow-up appointment confirmed?: Yes Date of Specialist follow-up appointment?: 06/10/23 Follow-Up Specialty Provider:: Dr. Rogue Clear Do you need transportation to your follow-up appointment?: No Do you understand care options if your condition(s) worsen?: Yes-patient verbalized understanding  SDOH Interventions Today    Flowsheet Row Most Recent Value  SDOH Interventions   Food  Insecurity Interventions Intervention Not Indicated  Housing Interventions Intervention Not Indicated  Transportation Interventions Intervention Not Indicated  Utilities Interventions Intervention Not Indicated  Social Connections Interventions Intervention Not Indicated      Interventions Today    Flowsheet Row Most Recent Value  Chronic Disease   Chronic disease during today's visit Other  [Cancer]  General Interventions   General Interventions Discussed/Reviewed General Interventions Discussed, Doctor Visits  Doctor Visits Discussed/Reviewed Doctor Visits Discussed, Specialist  PCP/Specialist Visits Compliance with follow-up visit  Education Interventions   Education Provided Provided Education  Provided Verbal Education On Medication, Insurance Plans, When to see the doctor  Nutrition Interventions   Nutrition Discussed/Reviewed Nutrition Discussed, Increasing proteins   Pharmacy Interventions   Pharmacy Dicussed/Reviewed Medications and their functions  Safety Interventions   Safety Discussed/Reviewed Fall Risk      The patient is discharged from [facility] following a procedure to remove her pancreatic head mass and a sac of pus was found around the GB and a drain was placed. The patient discharged home with her husband. She states they were able to perform the proper procedure for flushing. Per discharge summary HH was ordered but a provider was not specified and it is not the EMR. The patient states she doesn't know who the agency is. RNCM will attempt to locate the agency information. Reviewed the patient's medication list as there were lots of changes. The patient declines follow up with PCP. She declines enrollment in the [facility] Outreach program. She states she feels overwhelmed enough with the phone calls and follow ups that she has.   The patient has been provided with contact information for the care management team and has been advised to call with any health-related questions or concerns. The patient verbalized understanding with current POC. The patient is directed to their insurance card regarding availability of benefits coverage.  Gareld June, RN Medical illustrator [facility] [phone]

## 2023-06-10 DIAGNOSIS — C250 Malignant neoplasm of head of pancreas: Secondary | ICD-10-CM | POA: Diagnosis not present

## 2023-06-10 DIAGNOSIS — R197 Diarrhea, unspecified: Secondary | ICD-10-CM | POA: Diagnosis not present

## 2023-06-10 DIAGNOSIS — K82A2 Perforation of gallbladder in cholecystitis: Secondary | ICD-10-CM | POA: Diagnosis not present

## 2023-06-10 DIAGNOSIS — Z87891 Personal history of nicotine dependence: Secondary | ICD-10-CM | POA: Diagnosis not present

## 2023-06-12 DIAGNOSIS — D630 Anemia in neoplastic disease: Secondary | ICD-10-CM | POA: Diagnosis not present

## 2023-06-12 DIAGNOSIS — C250 Malignant neoplasm of head of pancreas: Secondary | ICD-10-CM | POA: Diagnosis not present

## 2023-06-12 DIAGNOSIS — K810 Acute cholecystitis: Secondary | ICD-10-CM | POA: Diagnosis not present

## 2023-06-12 DIAGNOSIS — Z9181 History of falling: Secondary | ICD-10-CM | POA: Diagnosis not present

## 2023-06-12 DIAGNOSIS — G8929 Other chronic pain: Secondary | ICD-10-CM | POA: Diagnosis not present

## 2023-06-12 DIAGNOSIS — Z434 Encounter for attention to other artificial openings of digestive tract: Secondary | ICD-10-CM | POA: Diagnosis not present

## 2023-06-16 ENCOUNTER — Inpatient Hospital Stay: Payer: BC Managed Care – PPO | Admitting: Hematology

## 2023-06-16 NOTE — Assessment & Plan Note (Deleted)
cT2N0M0, stage IB, unresectable  -Patient presented with jaundice, abdominal MRI showed a poorly differentiated hypervascular area in the head of pancreas, measuring 3.5 x 3.0 cm.  The mass unfortunately invades multiple vascular structures including celiac artery, the proximal SMA and SMV -Status post ERCP and biliary stent placement. Dr. Elnoria Howard performed EUS and fine-needle biopsy of the pancreatic mass twice, and the second biopsy confirmed adenocarcinoma.  -Unfortunately her pancreatic mass is not resectable due to the invading celiac artery and SMA, case was discussed in GI tumor conference few days ago.  She also has a small lesion in the liver on the recent CT scan which was not seen on the previous MRI.  Plan to repeat MRI in 3 months.  I reviewed the above with patient and her husband in detail today. -We discussed the incurable nature of her cancer, and treatment options, she includes chemotherapy and radiation. -She has good performance status, I recommend NALIRIFOX as first-line chemotherapy.  We discussed chemotherapy for 3 to 6 months, followed by SBRT radiation, or continue chemotherapy until disease progression. -plan to start today 06/16/23

## 2023-06-20 DIAGNOSIS — C250 Malignant neoplasm of head of pancreas: Secondary | ICD-10-CM | POA: Diagnosis not present

## 2023-06-20 DIAGNOSIS — K810 Acute cholecystitis: Secondary | ICD-10-CM | POA: Diagnosis not present

## 2023-06-20 DIAGNOSIS — G8929 Other chronic pain: Secondary | ICD-10-CM | POA: Diagnosis not present

## 2023-06-20 DIAGNOSIS — Z434 Encounter for attention to other artificial openings of digestive tract: Secondary | ICD-10-CM | POA: Diagnosis not present

## 2023-06-20 DIAGNOSIS — D630 Anemia in neoplastic disease: Secondary | ICD-10-CM | POA: Diagnosis not present

## 2023-06-20 DIAGNOSIS — Z9181 History of falling: Secondary | ICD-10-CM | POA: Diagnosis not present

## 2023-06-21 DIAGNOSIS — S31103A Unspecified open wound of abdominal wall, right lower quadrant without penetration into peritoneal cavity, initial encounter: Secondary | ICD-10-CM | POA: Diagnosis not present

## 2023-06-21 DIAGNOSIS — T8131XA Disruption of external operation (surgical) wound, not elsewhere classified, initial encounter: Secondary | ICD-10-CM | POA: Diagnosis not present

## 2023-06-22 DIAGNOSIS — G8929 Other chronic pain: Secondary | ICD-10-CM | POA: Diagnosis not present

## 2023-06-22 DIAGNOSIS — Z9181 History of falling: Secondary | ICD-10-CM | POA: Diagnosis not present

## 2023-06-22 DIAGNOSIS — C250 Malignant neoplasm of head of pancreas: Secondary | ICD-10-CM | POA: Diagnosis not present

## 2023-06-22 DIAGNOSIS — K810 Acute cholecystitis: Secondary | ICD-10-CM | POA: Diagnosis not present

## 2023-06-22 DIAGNOSIS — D630 Anemia in neoplastic disease: Secondary | ICD-10-CM | POA: Diagnosis not present

## 2023-06-22 DIAGNOSIS — Z434 Encounter for attention to other artificial openings of digestive tract: Secondary | ICD-10-CM | POA: Diagnosis not present

## 2023-06-24 DIAGNOSIS — K8000 Calculus of gallbladder with acute cholecystitis without obstruction: Secondary | ICD-10-CM | POA: Diagnosis not present

## 2023-06-26 DIAGNOSIS — S31109A Unspecified open wound of abdominal wall, unspecified quadrant without penetration into peritoneal cavity, initial encounter: Secondary | ICD-10-CM | POA: Diagnosis not present

## 2023-06-26 DIAGNOSIS — T8131XA Disruption of external operation (surgical) wound, not elsewhere classified, initial encounter: Secondary | ICD-10-CM | POA: Diagnosis not present

## 2023-06-28 DIAGNOSIS — C250 Malignant neoplasm of head of pancreas: Secondary | ICD-10-CM | POA: Diagnosis not present

## 2023-06-28 DIAGNOSIS — K810 Acute cholecystitis: Secondary | ICD-10-CM | POA: Diagnosis not present

## 2023-06-28 DIAGNOSIS — Z4659 Encounter for fitting and adjustment of other gastrointestinal appliance and device: Secondary | ICD-10-CM | POA: Diagnosis not present

## 2023-06-29 DIAGNOSIS — C250 Malignant neoplasm of head of pancreas: Secondary | ICD-10-CM | POA: Diagnosis not present

## 2023-06-29 DIAGNOSIS — D630 Anemia in neoplastic disease: Secondary | ICD-10-CM | POA: Diagnosis not present

## 2023-06-29 DIAGNOSIS — Z9181 History of falling: Secondary | ICD-10-CM | POA: Diagnosis not present

## 2023-06-29 DIAGNOSIS — Z434 Encounter for attention to other artificial openings of digestive tract: Secondary | ICD-10-CM | POA: Diagnosis not present

## 2023-06-29 DIAGNOSIS — G8929 Other chronic pain: Secondary | ICD-10-CM | POA: Diagnosis not present

## 2023-06-29 DIAGNOSIS — K810 Acute cholecystitis: Secondary | ICD-10-CM | POA: Diagnosis not present

## 2023-07-04 DIAGNOSIS — K810 Acute cholecystitis: Secondary | ICD-10-CM | POA: Diagnosis not present

## 2023-07-04 DIAGNOSIS — Z4659 Encounter for fitting and adjustment of other gastrointestinal appliance and device: Secondary | ICD-10-CM | POA: Diagnosis not present

## 2023-07-04 DIAGNOSIS — Z431 Encounter for attention to gastrostomy: Secondary | ICD-10-CM | POA: Diagnosis not present

## 2023-07-06 DIAGNOSIS — K8011 Calculus of gallbladder with chronic cholecystitis with obstruction: Secondary | ICD-10-CM | POA: Diagnosis not present

## 2023-07-06 DIAGNOSIS — C250 Malignant neoplasm of head of pancreas: Secondary | ICD-10-CM | POA: Diagnosis not present

## 2023-07-06 DIAGNOSIS — D696 Thrombocytopenia, unspecified: Secondary | ICD-10-CM | POA: Diagnosis not present

## 2023-07-06 DIAGNOSIS — C801 Malignant (primary) neoplasm, unspecified: Secondary | ICD-10-CM | POA: Diagnosis not present

## 2023-07-06 DIAGNOSIS — K7689 Other specified diseases of liver: Secondary | ICD-10-CM | POA: Diagnosis not present

## 2023-07-06 DIAGNOSIS — K828 Other specified diseases of gallbladder: Secondary | ICD-10-CM | POA: Diagnosis not present

## 2023-07-06 DIAGNOSIS — S31109A Unspecified open wound of abdominal wall, unspecified quadrant without penetration into peritoneal cavity, initial encounter: Secondary | ICD-10-CM | POA: Diagnosis not present

## 2023-07-06 DIAGNOSIS — R109 Unspecified abdominal pain: Secondary | ICD-10-CM | POA: Diagnosis not present

## 2023-07-06 DIAGNOSIS — K869 Disease of pancreas, unspecified: Secondary | ICD-10-CM | POA: Diagnosis not present

## 2023-07-06 DIAGNOSIS — T8131XA Disruption of external operation (surgical) wound, not elsewhere classified, initial encounter: Secondary | ICD-10-CM | POA: Diagnosis not present

## 2023-07-06 DIAGNOSIS — R188 Other ascites: Secondary | ICD-10-CM | POA: Diagnosis not present

## 2023-07-06 DIAGNOSIS — K838 Other specified diseases of biliary tract: Secondary | ICD-10-CM | POA: Diagnosis not present

## 2023-07-06 DIAGNOSIS — C259 Malignant neoplasm of pancreas, unspecified: Secondary | ICD-10-CM | POA: Diagnosis not present

## 2023-07-06 DIAGNOSIS — R000 Tachycardia, unspecified: Secondary | ICD-10-CM | POA: Diagnosis not present

## 2023-07-06 DIAGNOSIS — K831 Obstruction of bile duct: Secondary | ICD-10-CM | POA: Diagnosis not present

## 2023-07-06 DIAGNOSIS — Z9689 Presence of other specified functional implants: Secondary | ICD-10-CM | POA: Diagnosis not present

## 2023-07-08 DIAGNOSIS — Z9049 Acquired absence of other specified parts of digestive tract: Secondary | ICD-10-CM | POA: Diagnosis not present

## 2023-07-08 DIAGNOSIS — Z923 Personal history of irradiation: Secondary | ICD-10-CM | POA: Diagnosis not present

## 2023-07-08 DIAGNOSIS — K8063 Calculus of gallbladder and bile duct with acute cholecystitis with obstruction: Secondary | ICD-10-CM | POA: Diagnosis not present

## 2023-07-08 DIAGNOSIS — R1011 Right upper quadrant pain: Secondary | ICD-10-CM | POA: Diagnosis not present

## 2023-07-08 DIAGNOSIS — C250 Malignant neoplasm of head of pancreas: Secondary | ICD-10-CM | POA: Diagnosis not present

## 2023-07-08 DIAGNOSIS — Z4659 Encounter for fitting and adjustment of other gastrointestinal appliance and device: Secondary | ICD-10-CM | POA: Diagnosis not present

## 2023-07-08 DIAGNOSIS — Z9221 Personal history of antineoplastic chemotherapy: Secondary | ICD-10-CM | POA: Diagnosis not present

## 2023-07-08 DIAGNOSIS — K8000 Calculus of gallbladder with acute cholecystitis without obstruction: Secondary | ICD-10-CM | POA: Diagnosis not present

## 2023-07-12 DIAGNOSIS — R600 Localized edema: Secondary | ICD-10-CM | POA: Diagnosis not present

## 2023-07-12 DIAGNOSIS — K8001 Calculus of gallbladder with acute cholecystitis with obstruction: Secondary | ICD-10-CM | POA: Diagnosis not present

## 2023-07-14 DIAGNOSIS — C250 Malignant neoplasm of head of pancreas: Secondary | ICD-10-CM | POA: Diagnosis not present

## 2023-07-14 DIAGNOSIS — Z9181 History of falling: Secondary | ICD-10-CM | POA: Diagnosis not present

## 2023-07-14 DIAGNOSIS — Z434 Encounter for attention to other artificial openings of digestive tract: Secondary | ICD-10-CM | POA: Diagnosis not present

## 2023-07-14 DIAGNOSIS — D630 Anemia in neoplastic disease: Secondary | ICD-10-CM | POA: Diagnosis not present

## 2023-07-14 DIAGNOSIS — K810 Acute cholecystitis: Secondary | ICD-10-CM | POA: Diagnosis not present

## 2023-07-14 DIAGNOSIS — G8929 Other chronic pain: Secondary | ICD-10-CM | POA: Diagnosis not present

## 2023-07-15 ENCOUNTER — Other Ambulatory Visit: Payer: BC Managed Care – PPO

## 2023-07-15 ENCOUNTER — Telehealth: Payer: Self-pay

## 2023-07-15 DIAGNOSIS — C250 Malignant neoplasm of head of pancreas: Secondary | ICD-10-CM

## 2023-07-15 DIAGNOSIS — D696 Thrombocytopenia, unspecified: Secondary | ICD-10-CM | POA: Diagnosis not present

## 2023-07-15 NOTE — Addendum Note (Signed)
Addended by: Shon Millet on: 07/15/2023 03:41 PM   Modules accepted: Orders

## 2023-07-15 NOTE — Telephone Encounter (Signed)
Note was from [facility], but if pt is going to get labs here PCP has to order them, we can't do other providers labs, will route to PCP to see if she wants to order labs listed in prev message

## 2023-07-15 NOTE — Telephone Encounter (Signed)
Pt said she can be here before closing today, she's on her way now

## 2023-07-15 NOTE — Telephone Encounter (Signed)
I put the orders in but it may be too late on a Friday -just seeing this now   Also I am out of town and will not see them right when they return and they will not be faxed until Monday earliest sorry  Let her know this  May elect [facility] instead based on that?

## 2023-07-15 NOTE — Telephone Encounter (Signed)
Copied from CRM [id number]. Topic: General - Other >> Jul 15, 2023 12:27 PM Kathryne Eriksson wrote: Reason for CRM: Requesting Blood Work >> Jul 15, 2023 12:34 PM Kathryne Eriksson wrote: [name] - [facility] [phone]  States that patient has pancreatic cancer and needs a CMP & CBC Completed TODAY, need the results faxed over to [phone].  Also states patient is scheduled to undergo cholecystostomy tube exchange on Tuesday, her platelets are 126 therefor increasing her risk of bleeding.

## 2023-07-16 LAB — CBC WITH DIFFERENTIAL/PLATELET
Absolute Lymphocytes: 210 {cells}/uL — ABNORMAL LOW (ref 850–3900)
Absolute Monocytes: 228 {cells}/uL (ref 200–950)
Basophils Absolute: 21 {cells}/uL (ref 0–200)
Basophils Relative: 0.7 %
Eosinophils Absolute: 51 {cells}/uL (ref 15–500)
Eosinophils Relative: 1.7 %
HCT: 29.2 % — ABNORMAL LOW (ref 35.0–45.0)
Hemoglobin: 9.3 g/dL — ABNORMAL LOW (ref 11.7–15.5)
MCH: 28 pg (ref 27.0–33.0)
MCHC: 31.8 g/dL — ABNORMAL LOW (ref 32.0–36.0)
MCV: 88 fL (ref 80.0–100.0)
MPV: 10.1 fL (ref 7.5–12.5)
Monocytes Relative: 7.6 %
Neutro Abs: 2490 {cells}/uL (ref 1500–7800)
Neutrophils Relative %: 83 %
Platelets: 158 10*3/uL (ref 140–400)
RBC: 3.32 10*6/uL — ABNORMAL LOW (ref 3.80–5.10)
RDW: 16 % — ABNORMAL HIGH (ref 11.0–15.0)
Total Lymphocyte: 7 %
WBC: 3 10*3/uL — ABNORMAL LOW (ref 3.8–10.8)

## 2023-07-16 LAB — COMPREHENSIVE METABOLIC PANEL
AG Ratio: 0.8 (calc) — ABNORMAL LOW (ref 1.0–2.5)
ALT: 12 U/L (ref 6–29)
AST: 18 U/L (ref 10–35)
Albumin: 2.6 g/dL — ABNORMAL LOW (ref 3.6–5.1)
Alkaline phosphatase (APISO): 125 U/L (ref 37–153)
BUN: 8 mg/dL (ref 7–25)
CO2: 27 mmol/L (ref 20–32)
Calcium: 8 mg/dL — ABNORMAL LOW (ref 8.6–10.4)
Chloride: 101 mmol/L (ref 98–110)
Creat: 0.94 mg/dL (ref 0.50–1.05)
Globulin: 3.3 g/dL (ref 1.9–3.7)
Glucose, Bld: 115 mg/dL — ABNORMAL HIGH (ref 65–99)
Potassium: 4.2 mmol/L (ref 3.5–5.3)
Sodium: 133 mmol/L — ABNORMAL LOW (ref 135–146)
Total Bilirubin: 0.4 mg/dL (ref 0.2–1.2)
Total Protein: 5.9 g/dL — ABNORMAL LOW (ref 6.1–8.1)

## 2023-07-17 ENCOUNTER — Encounter: Payer: Self-pay | Admitting: Family Medicine

## 2023-07-19 DIAGNOSIS — K810 Acute cholecystitis: Secondary | ICD-10-CM | POA: Diagnosis not present

## 2023-07-19 DIAGNOSIS — Z4659 Encounter for fitting and adjustment of other gastrointestinal appliance and device: Secondary | ICD-10-CM | POA: Diagnosis not present

## 2023-07-19 DIAGNOSIS — R600 Localized edema: Secondary | ICD-10-CM | POA: Diagnosis not present

## 2023-07-19 DIAGNOSIS — Z9359 Other cystostomy status: Secondary | ICD-10-CM | POA: Diagnosis not present

## 2023-07-19 IMAGING — MG MM DIGITAL DIAGNOSTIC UNILAT*R* W/ TOMO W/ CAD
4 series · 4 of 12 positions shown · non-contrast
Comparison: Previous exam(s).

CLINICAL DATA: Six-month follow-up of a right breast mass first
identified in Saturday November, 2020.

EXAM:
DIGITAL DIAGNOSTIC UNILATERAL RIGHT MAMMOGRAM WITH TOMOSYNTHESIS AND
CAD; ULTRASOUND RIGHT BREAST LIMITED
TECHNIQUE: Right digital diagnostic mammography and breast tomosynthesis was
performed. The images were evaluated with computer-aided detection.;
Targeted ultrasound examination of the right breast was performed

[R CC synth-2D]
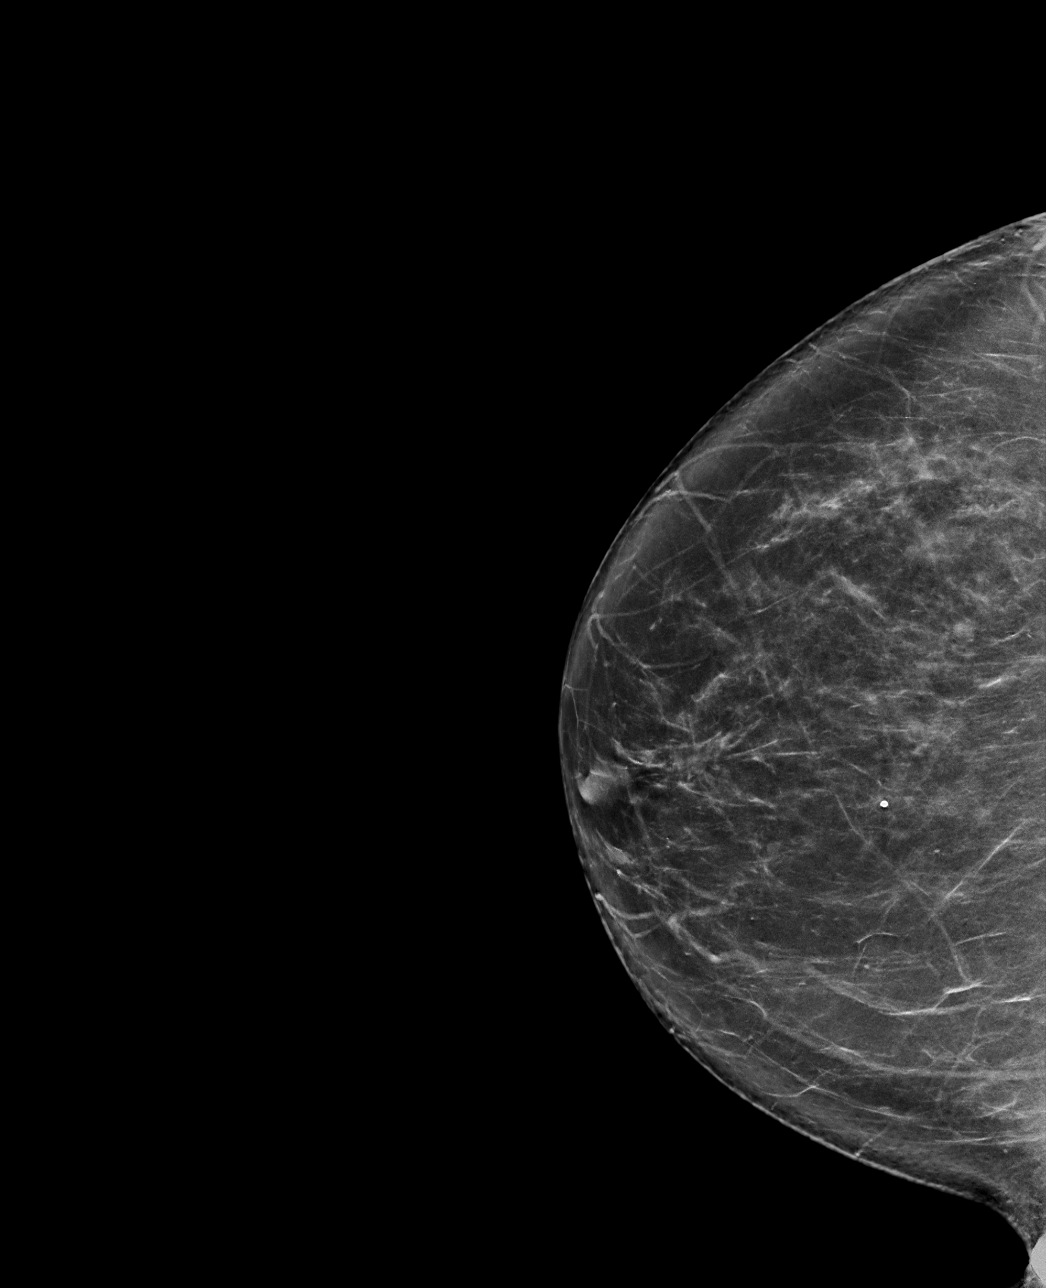

[R MLO synth-2D]
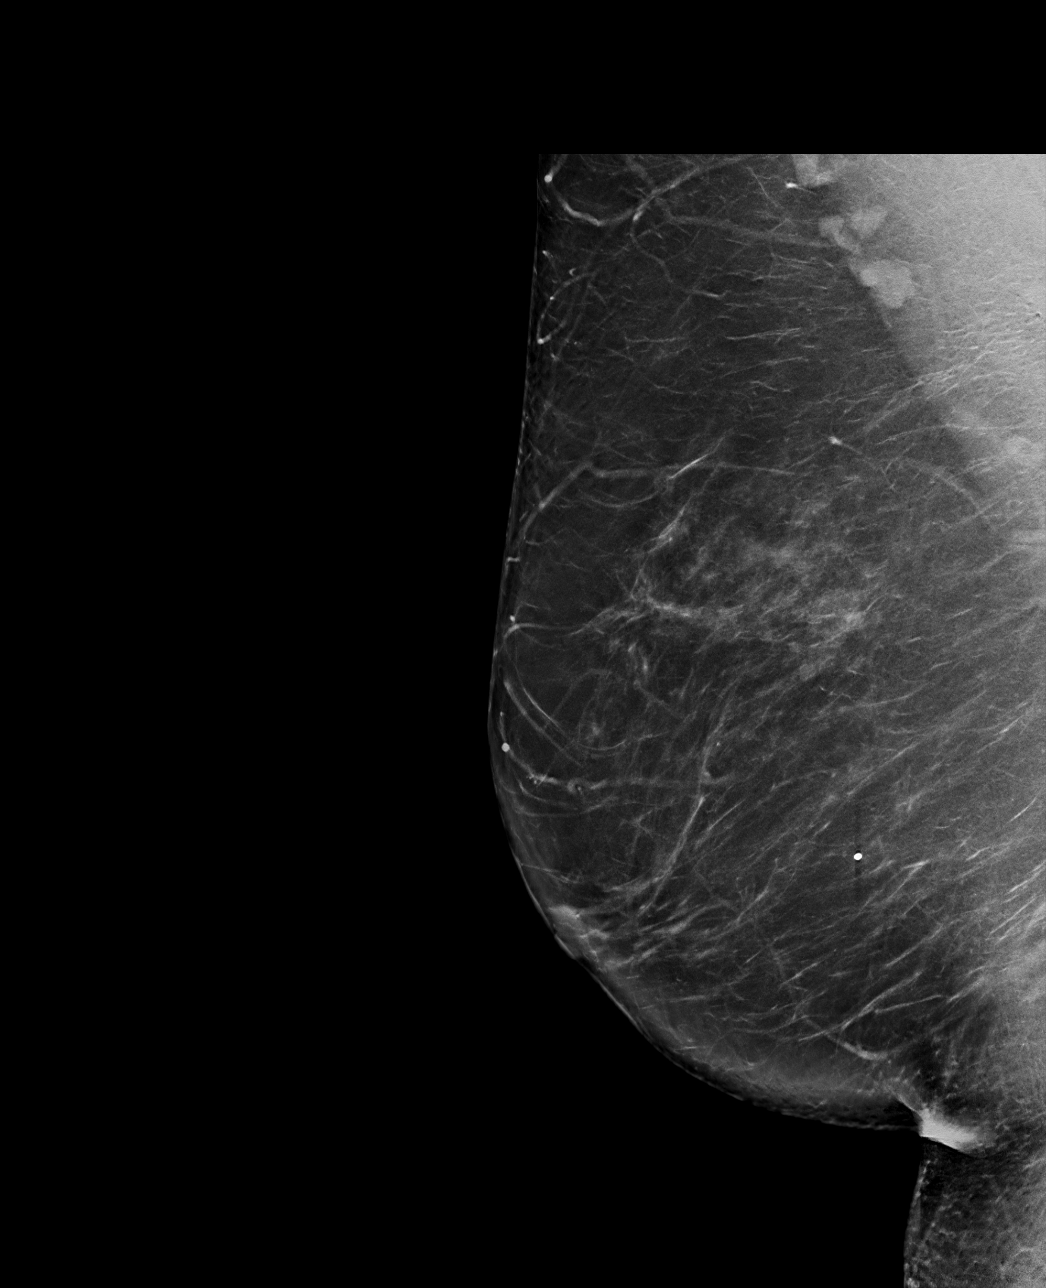

[R CC tomo · tomo slice 43/85.0]
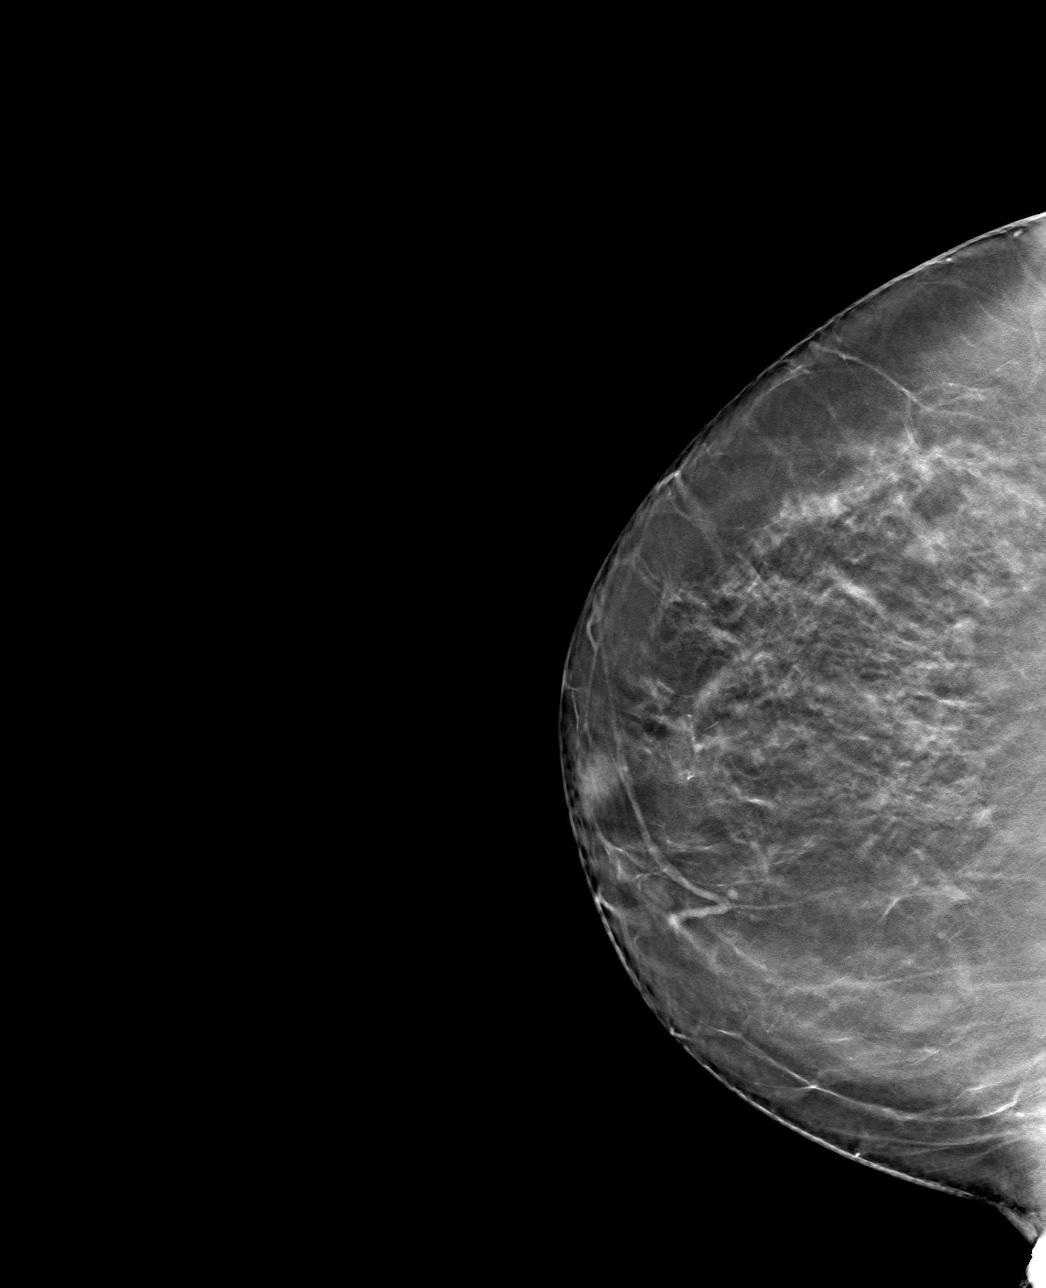

[R MLO tomo · tomo slice 48/95.0]
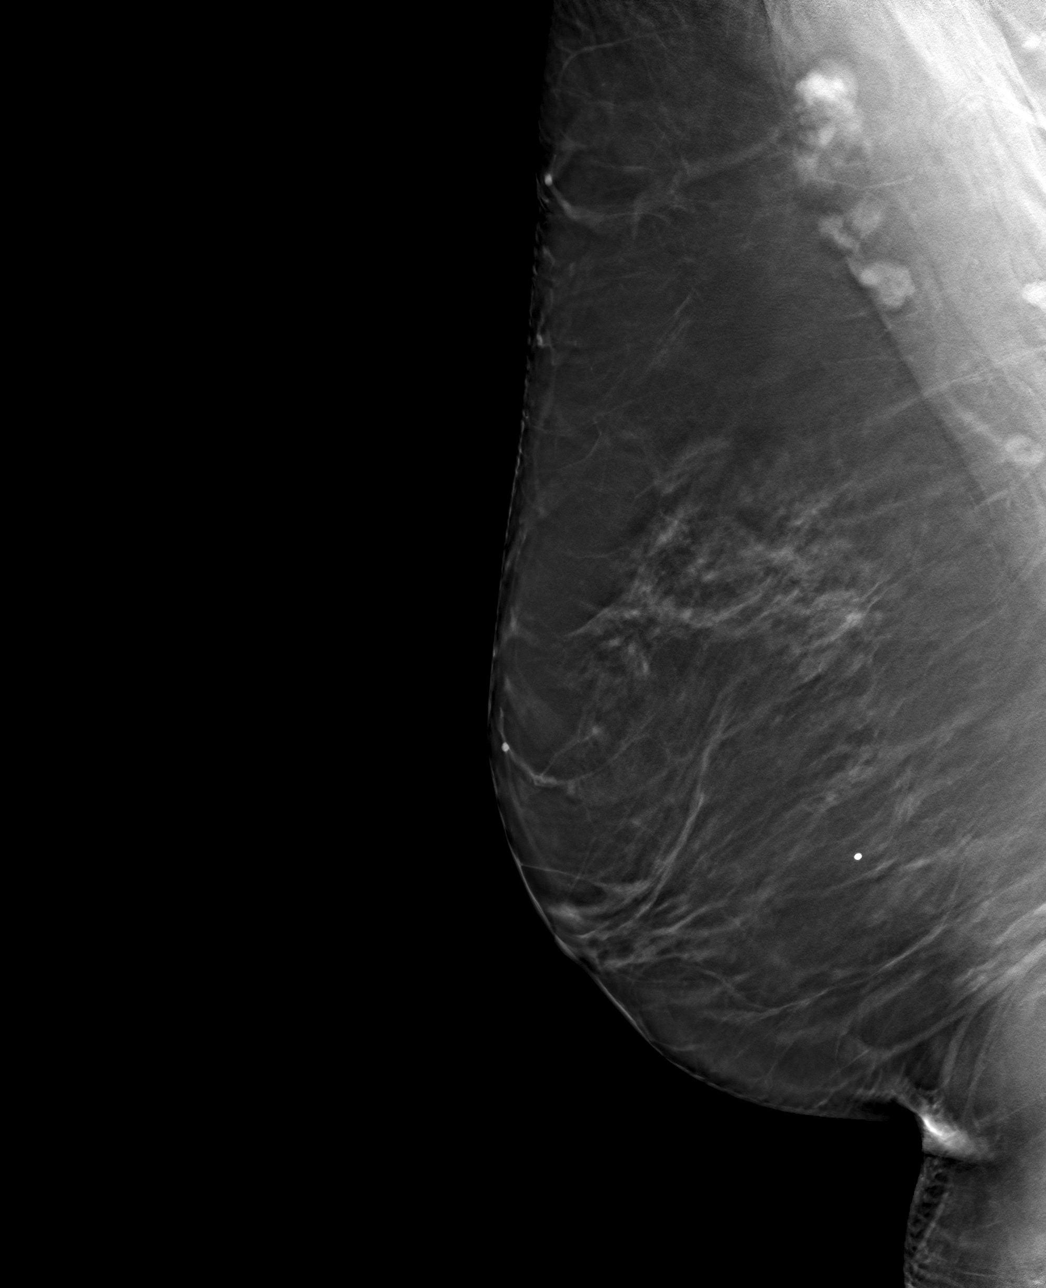

[4 of 12 positions shown; findings below may reference images not displayed]

ACR Breast Density Category b: There are scattered areas of
fibroglandular density.
FINDINGS: The mass in the lateral central to slightly superior right breast at
a mid to posterior depth is stable. No other suspicious findings in
the right breast.

On physical exam, no suspicious lumps are identified.

Targeted ultrasound is performed, showing no interval change in the
right breast mass at 9 o'clock, 6 cm from the nipple measuring 3 by
4 x 3 mm today versus 3 by 4 x 3 mm previously.
IMPRESSION: No change in the probably benign right breast mass.

RECOMMENDATION:
Six-month follow-up mammogram and ultrasound of the probably benign
right breast mass. The patient will be due for bilateral mammography
at that time.

I have discussed the findings and recommendations with the patient.
If applicable, a reminder letter will be sent to the patient
regarding the next appointment.

BI-RADS CATEGORY  3: Probably benign.

## 2023-07-19 IMAGING — US US BREAST*R* LIMITED INC AXILLA
1 series · 6 of 6 positions shown · non-contrast
Comparison: Previous exam(s).

CLINICAL DATA: Six-month follow-up of a right breast mass first
identified in Saturday November, 2020.

EXAM:
DIGITAL DIAGNOSTIC UNILATERAL RIGHT MAMMOGRAM WITH TOMOSYNTHESIS AND
CAD; ULTRASOUND RIGHT BREAST LIMITED
TECHNIQUE: Right digital diagnostic mammography and breast tomosynthesis was
performed. The images were evaluated with computer-aided detection.;
Targeted ultrasound examination of the right breast was performed

[Series 1: us breast*right* limited inc axilla · 0.07mm/px · 6 of 6 slices shown]
[im 1/6]
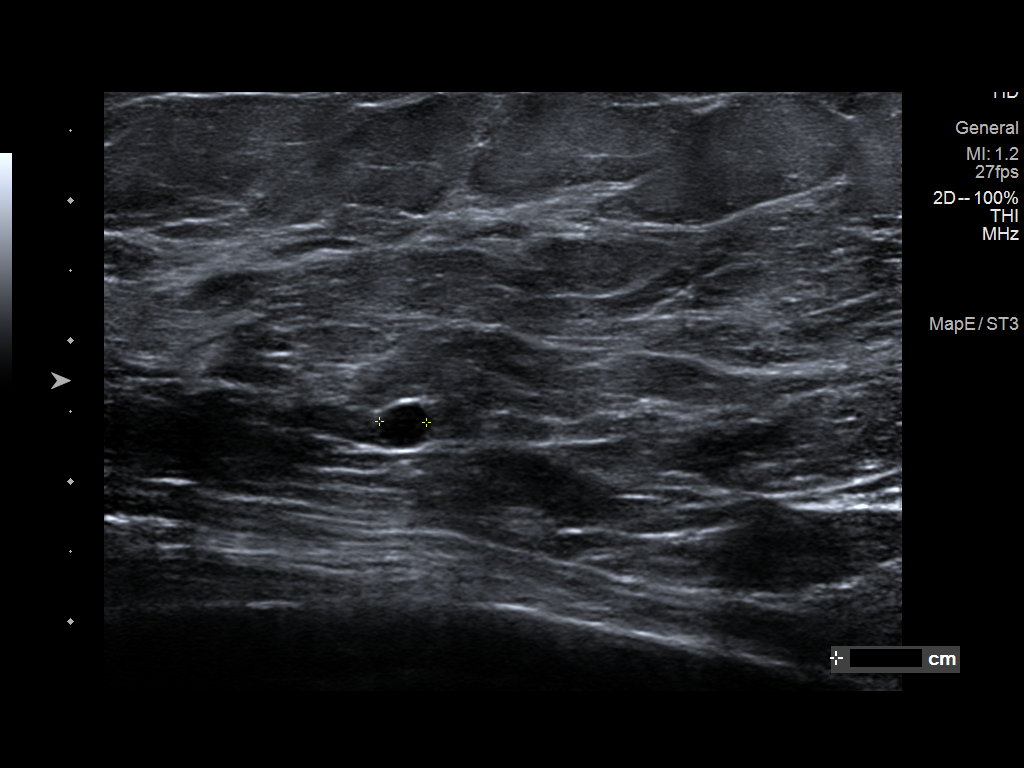
[im 2/6]
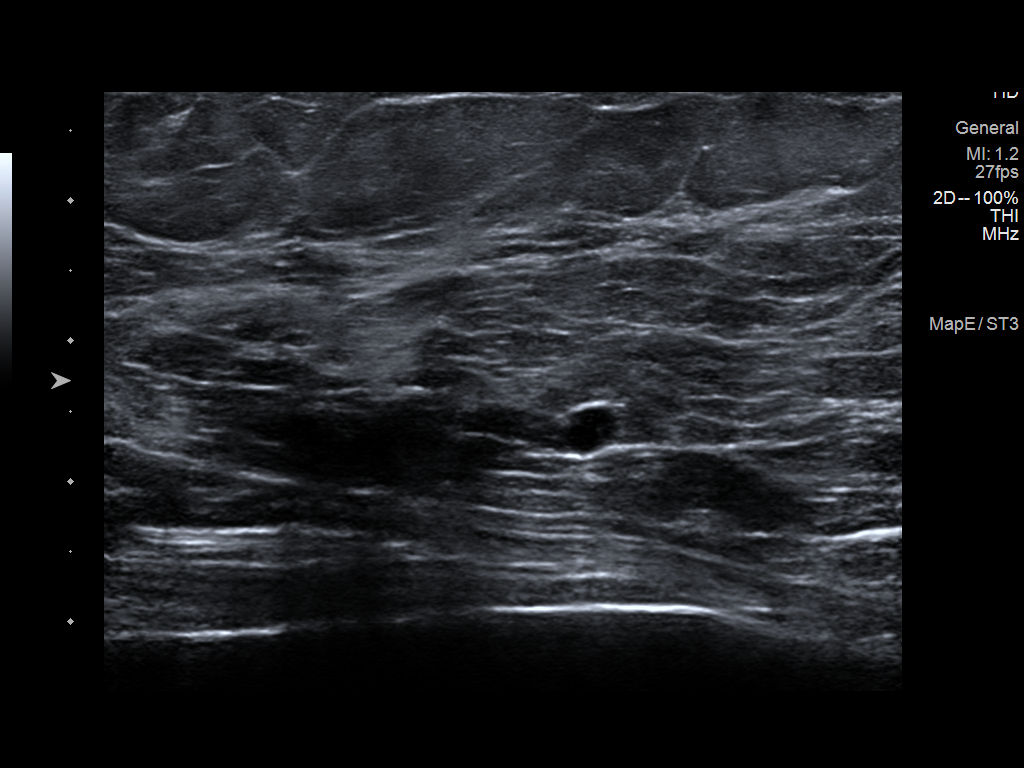
[im 3/6]
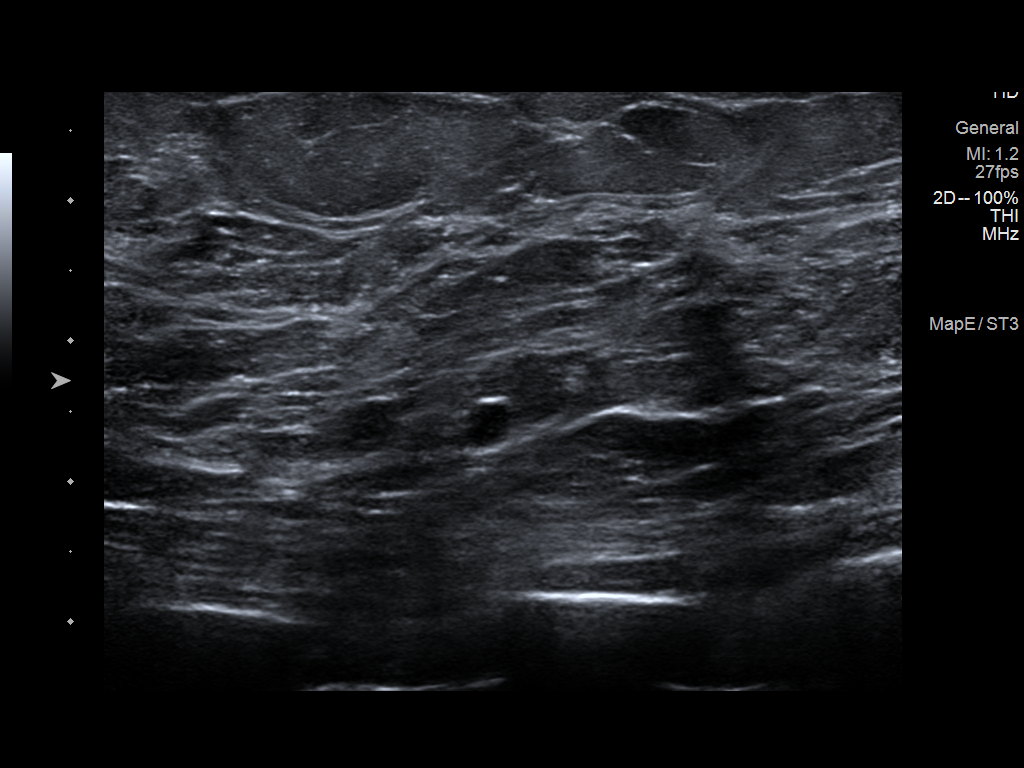
[im 4/6]
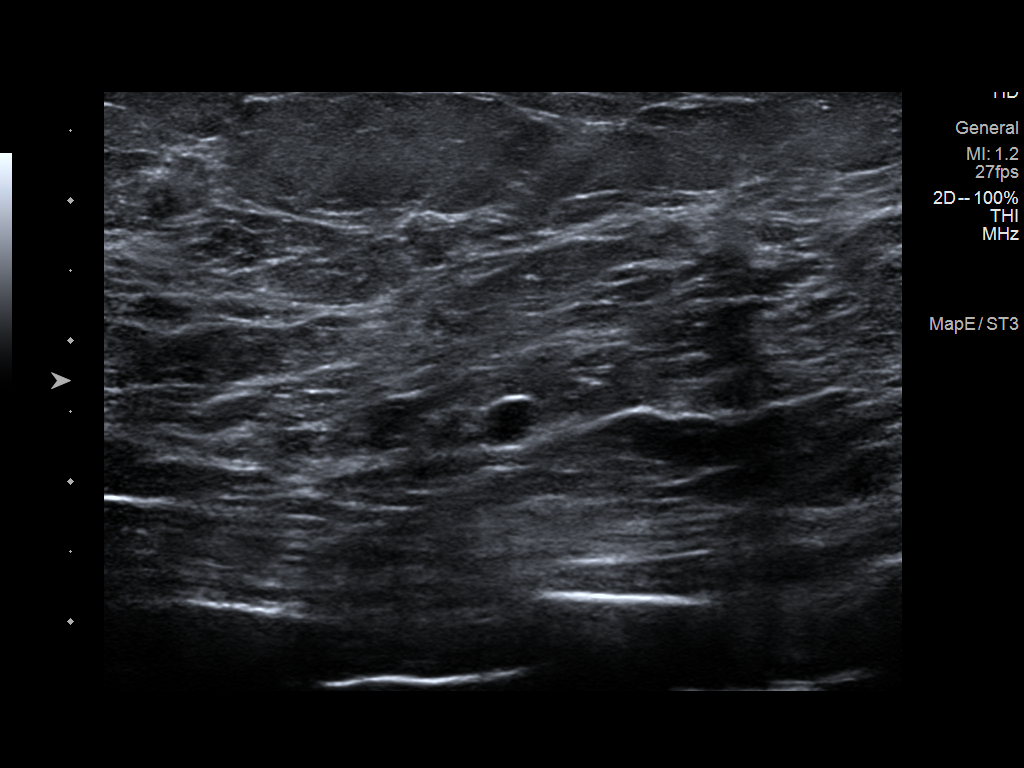
[im 5/6]
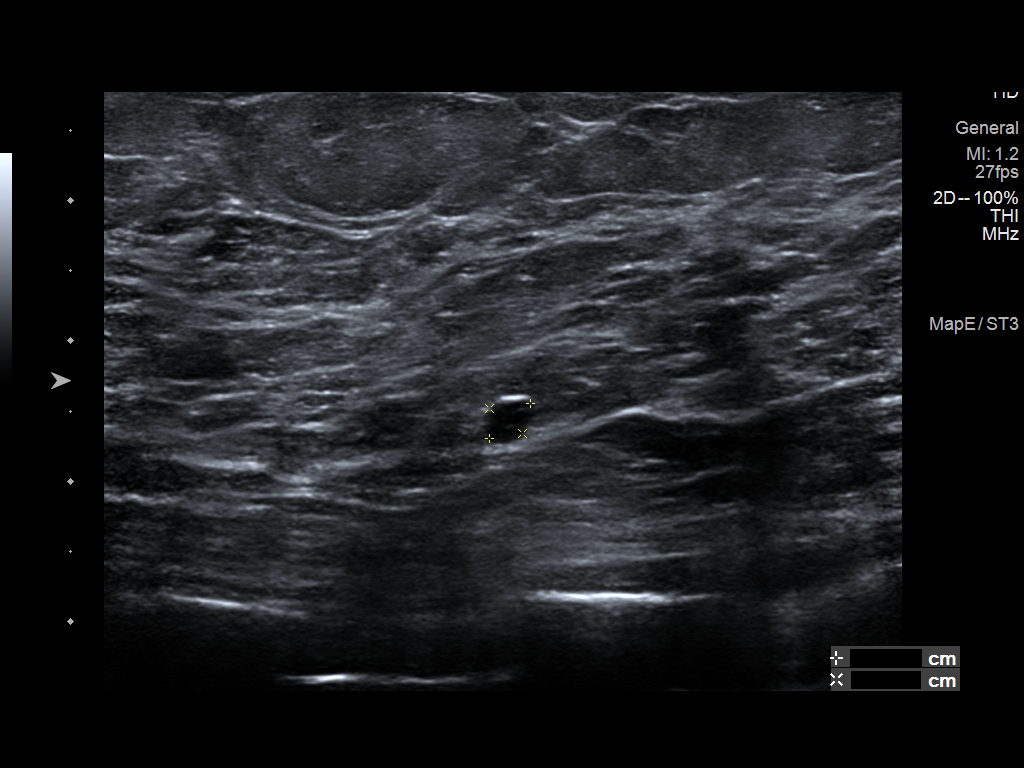
[im 6/6]
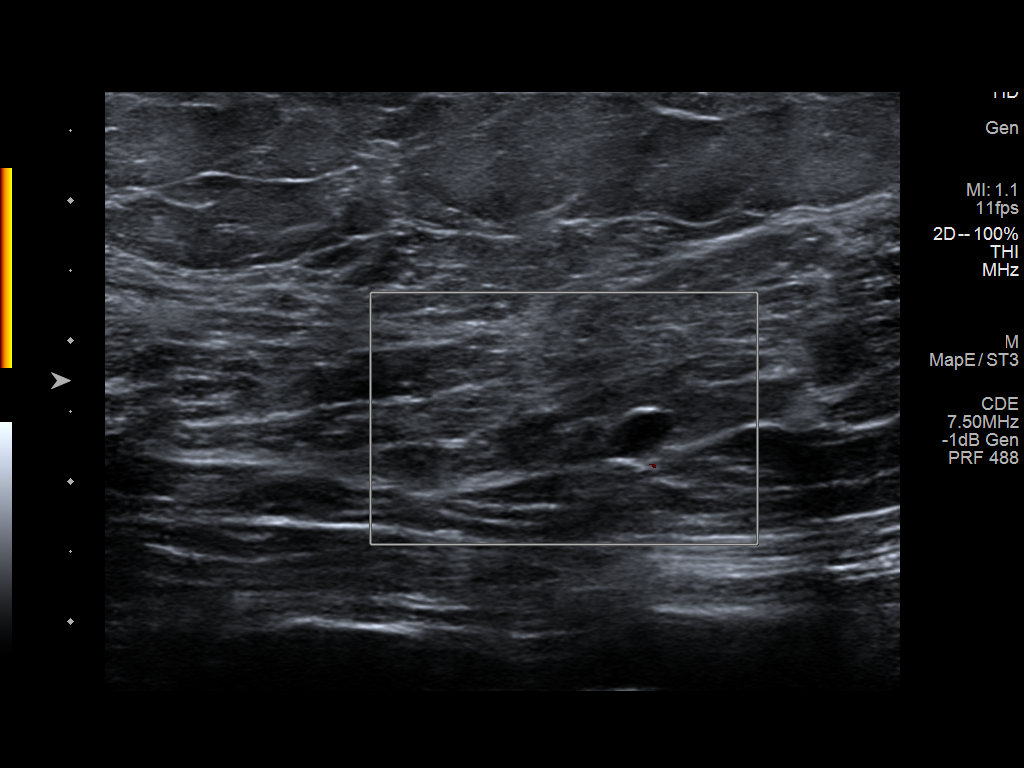

[6 of 6 positions shown; findings below may reference images not displayed]

ACR Breast Density Category b: There are scattered areas of
fibroglandular density.
FINDINGS: The mass in the lateral central to slightly superior right breast at
a mid to posterior depth is stable. No other suspicious findings in
the right breast.

On physical exam, no suspicious lumps are identified.

Targeted ultrasound is performed, showing no interval change in the
right breast mass at 9 o'clock, 6 cm from the nipple measuring 3 by
4 x 3 mm today versus 3 by 4 x 3 mm previously.
IMPRESSION: No change in the probably benign right breast mass.

RECOMMENDATION:
Six-month follow-up mammogram and ultrasound of the probably benign
right breast mass. The patient will be due for bilateral mammography
at that time.

I have discussed the findings and recommendations with the patient.
If applicable, a reminder letter will be sent to the patient
regarding the next appointment.

BI-RADS CATEGORY  3: Probably benign.

## 2023-07-21 DIAGNOSIS — Z434 Encounter for attention to other artificial openings of digestive tract: Secondary | ICD-10-CM | POA: Diagnosis not present

## 2023-07-21 DIAGNOSIS — K810 Acute cholecystitis: Secondary | ICD-10-CM | POA: Diagnosis not present

## 2023-07-21 DIAGNOSIS — D630 Anemia in neoplastic disease: Secondary | ICD-10-CM | POA: Diagnosis not present

## 2023-07-21 DIAGNOSIS — C250 Malignant neoplasm of head of pancreas: Secondary | ICD-10-CM | POA: Diagnosis not present

## 2023-07-21 DIAGNOSIS — G8929 Other chronic pain: Secondary | ICD-10-CM | POA: Diagnosis not present

## 2023-07-21 DIAGNOSIS — Z9181 History of falling: Secondary | ICD-10-CM | POA: Diagnosis not present

## 2023-07-22 DIAGNOSIS — K819 Cholecystitis, unspecified: Secondary | ICD-10-CM | POA: Diagnosis not present

## 2023-07-26 DIAGNOSIS — K810 Acute cholecystitis: Secondary | ICD-10-CM | POA: Diagnosis not present

## 2023-07-26 DIAGNOSIS — Z434 Encounter for attention to other artificial openings of digestive tract: Secondary | ICD-10-CM | POA: Diagnosis not present

## 2023-07-26 DIAGNOSIS — C250 Malignant neoplasm of head of pancreas: Secondary | ICD-10-CM | POA: Diagnosis not present

## 2023-07-26 DIAGNOSIS — G8929 Other chronic pain: Secondary | ICD-10-CM | POA: Diagnosis not present

## 2023-07-26 DIAGNOSIS — Z9181 History of falling: Secondary | ICD-10-CM | POA: Diagnosis not present

## 2023-07-26 DIAGNOSIS — D630 Anemia in neoplastic disease: Secondary | ICD-10-CM | POA: Diagnosis not present

## 2023-07-28 DIAGNOSIS — R600 Localized edema: Secondary | ICD-10-CM | POA: Diagnosis not present

## 2023-07-28 DIAGNOSIS — R000 Tachycardia, unspecified: Secondary | ICD-10-CM | POA: Diagnosis not present

## 2023-08-03 DIAGNOSIS — Z9181 History of falling: Secondary | ICD-10-CM | POA: Diagnosis not present

## 2023-08-03 DIAGNOSIS — Z434 Encounter for attention to other artificial openings of digestive tract: Secondary | ICD-10-CM | POA: Diagnosis not present

## 2023-08-03 DIAGNOSIS — D630 Anemia in neoplastic disease: Secondary | ICD-10-CM | POA: Diagnosis not present

## 2023-08-03 DIAGNOSIS — C250 Malignant neoplasm of head of pancreas: Secondary | ICD-10-CM | POA: Diagnosis not present

## 2023-08-03 DIAGNOSIS — K810 Acute cholecystitis: Secondary | ICD-10-CM | POA: Diagnosis not present

## 2023-08-03 DIAGNOSIS — G8929 Other chronic pain: Secondary | ICD-10-CM | POA: Diagnosis not present

## 2023-08-04 DIAGNOSIS — Z4803 Encounter for change or removal of drains: Secondary | ICD-10-CM | POA: Diagnosis not present

## 2023-08-04 DIAGNOSIS — Z4659 Encounter for fitting and adjustment of other gastrointestinal appliance and device: Secondary | ICD-10-CM | POA: Diagnosis not present

## 2023-08-04 DIAGNOSIS — K810 Acute cholecystitis: Secondary | ICD-10-CM | POA: Diagnosis not present

## 2023-08-10 DIAGNOSIS — C250 Malignant neoplasm of head of pancreas: Secondary | ICD-10-CM | POA: Diagnosis not present

## 2023-08-10 DIAGNOSIS — Z434 Encounter for attention to other artificial openings of digestive tract: Secondary | ICD-10-CM | POA: Diagnosis not present

## 2023-08-10 DIAGNOSIS — Z9181 History of falling: Secondary | ICD-10-CM | POA: Diagnosis not present

## 2023-08-10 DIAGNOSIS — K810 Acute cholecystitis: Secondary | ICD-10-CM | POA: Diagnosis not present

## 2023-08-10 DIAGNOSIS — D630 Anemia in neoplastic disease: Secondary | ICD-10-CM | POA: Diagnosis not present

## 2023-08-10 DIAGNOSIS — G8929 Other chronic pain: Secondary | ICD-10-CM | POA: Diagnosis not present

## 2023-08-12 DIAGNOSIS — C250 Malignant neoplasm of head of pancreas: Secondary | ICD-10-CM | POA: Diagnosis not present

## 2023-08-12 DIAGNOSIS — Z79899 Other long term (current) drug therapy: Secondary | ICD-10-CM | POA: Diagnosis not present

## 2023-08-12 DIAGNOSIS — R197 Diarrhea, unspecified: Secondary | ICD-10-CM | POA: Diagnosis not present

## 2023-08-12 DIAGNOSIS — K529 Noninfective gastroenteritis and colitis, unspecified: Secondary | ICD-10-CM | POA: Diagnosis not present

## 2023-08-12 DIAGNOSIS — K7689 Other specified diseases of liver: Secondary | ICD-10-CM | POA: Diagnosis not present

## 2023-08-12 DIAGNOSIS — Z923 Personal history of irradiation: Secondary | ICD-10-CM | POA: Diagnosis not present

## 2023-08-16 DIAGNOSIS — C250 Malignant neoplasm of head of pancreas: Secondary | ICD-10-CM | POA: Diagnosis not present

## 2023-08-16 DIAGNOSIS — K819 Cholecystitis, unspecified: Secondary | ICD-10-CM | POA: Diagnosis not present

## 2023-08-23 DIAGNOSIS — D72819 Decreased white blood cell count, unspecified: Secondary | ICD-10-CM | POA: Diagnosis not present

## 2023-08-23 DIAGNOSIS — Z9221 Personal history of antineoplastic chemotherapy: Secondary | ICD-10-CM | POA: Diagnosis not present

## 2023-08-23 DIAGNOSIS — Z87891 Personal history of nicotine dependence: Secondary | ICD-10-CM | POA: Diagnosis not present

## 2023-08-23 DIAGNOSIS — D696 Thrombocytopenia, unspecified: Secondary | ICD-10-CM | POA: Diagnosis not present

## 2023-08-23 DIAGNOSIS — C250 Malignant neoplasm of head of pancreas: Secondary | ICD-10-CM | POA: Diagnosis not present

## 2023-08-23 DIAGNOSIS — R161 Splenomegaly, not elsewhere classified: Secondary | ICD-10-CM | POA: Diagnosis not present

## 2023-08-23 DIAGNOSIS — Z923 Personal history of irradiation: Secondary | ICD-10-CM | POA: Diagnosis not present

## 2023-08-25 ENCOUNTER — Ambulatory Visit (INDEPENDENT_AMBULATORY_CARE_PROVIDER_SITE_OTHER): Admitting: Family Medicine

## 2023-08-25 ENCOUNTER — Encounter: Payer: Self-pay | Admitting: Family Medicine

## 2023-08-25 ENCOUNTER — Ambulatory Visit (INDEPENDENT_AMBULATORY_CARE_PROVIDER_SITE_OTHER)
Admission: RE | Admit: 2023-08-25 | Discharge: 2023-08-25 | Disposition: A | Discharge status: Home / Self Care | Source: Ambulatory Visit | Attending: Family Medicine | Admitting: Family Medicine

## 2023-08-25 VITALS — BP 98/64 | HR 100 | Temp 98.1°F | Ht 65.0 in | Wt 133.1 lb

## 2023-08-25 DIAGNOSIS — M79645 Pain in left finger(s): Secondary | ICD-10-CM | POA: Diagnosis not present

## 2023-08-25 DIAGNOSIS — M1812 Unilateral primary osteoarthritis of first carpometacarpal joint, left hand: Secondary | ICD-10-CM | POA: Diagnosis not present

## 2023-08-25 NOTE — Progress Notes (Signed)
 Subjective:    Patient ID: Nicole Johnson, female    DOB: [birth year], 62 y.o.   MRN: [id number]  HPI  Wt Readings from Last 3 Encounters:  08/25/23 133 lb 2 oz (60.4 kg)  04/08/23 171 lb (77.6 kg)  04/02/23 160 lb (72.6 kg)   22.15 kg/m  Vitals:   08/25/23 1223  BP: 98/64  Pulse: 100  Temp: 98.1 F (36.7 C)  SpO2: 100%    Pt presents with pain in left thumb  2-3 weeks  ? 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  No longer does this   Pain is between wrist and first joint  When pain is bad cannot use thumb to grasp / or reach across  Burning feeling  Does bother her at times when not doing anything   Movement makes it worse   Wakes up with it hurting-she sleeps with wrist/hand curled up   Occational cramps in fingers    Had trigger finger in right thumb in past      Has pancreatic cancer  Holding chemo due to low plt ct and infection  May re start later   Cannot gain weight  Has a really good appetite Gets diarrhea easily  Was told to avoid fiber /cannot use fiber supplement   Xr today DG Hand Complete Left Result Date: 08/25/2023 CLINICAL DATA:  Left thumb pain at the [facility] joint. EXAM: LEFT HAND - COMPLETE 3+ VIEW COMPARISON:  None Available. FINDINGS: Spurring and osteoarthritis at the first carpometacarpal joint. Negative for an acute fracture or dislocation. Mild degenerative changes at the STT joint. No focal soft tissue abnormality. Normal alignment at the left wrist. IMPRESSION: 1. No acute bone abnormality to the left hand. 2. Osteoarthritis at the first carpometacarpal joint. Electronically Signed   By: Richarda Overlie M.D.   On: 08/25/2023 13:45      Patient Active Problem List   Diagnosis Date Noted   Pain of left thumb 08/25/2023   Bilateral lower extremity edema 04/08/2023   Thrombocytopenia (HCC) 04/03/2023   Hyponatremia 04/03/2023   Acute cholecystitis 04/03/2023   Genetic testing  01/28/2023   Pancreatic cancer (HCC) 01/06/2023   Jaundice 12/16/2022   Allergic rhinitis 09/09/2022   Mild anemia 02/10/2021   Routine general medical examination at a health care facility 01/01/2021   Muscle cramps 07/10/2020   Current use of proton pump inhibitor 07/10/2020   B12 deficiency 07/10/2020   Prediabetes 12/26/2019   Gastroesophageal reflux disease 12/02/2017   Stress incontinence of urine 07/26/2016   Past Medical History:  Diagnosis Date   Arthritis    GERD (gastroesophageal reflux disease)    PONV (postoperative nausea and vomiting)    Prediabetes    Past Surgical History:  Procedure Laterality Date   BILIARY BRUSHING  12/17/2022   Procedure: BILIARY BRUSHING;  Surgeon: Jeani Hawking, MD;  Location: [facility];  Service: Gastroenterology;;   BILIARY STENT PLACEMENT N/A 12/17/2022   Procedure: BILIARY STENT PLACEMENT;  Surgeon: Jeani Hawking, MD;  Location: [facility];  Service: Gastroenterology;  Laterality: N/A;   BILIARY STENT PLACEMENT N/A 04/05/2023   Procedure: BILIARY STENT PLACEMENT;  Surgeon: Jeani Hawking, MD;  Location: [facility];  Service: Gastroenterology;  Laterality: N/A;   ERCP N/A 12/17/2022   Procedure: ENDOSCOPIC RETROGRADE CHOLANGIOPANCREATOGRAPHY (ERCP);  Surgeon: Jeani Hawking, MD;  Location: [facility];  Service: Gastroenterology;  Laterality: N/A;   ERCP N/A 04/05/2023   Procedure:  ENDOSCOPIC RETROGRADE CHOLANGIOPANCREATOGRAPHY (ERCP);  Surgeon: Jeani Hawking, MD;  Location: [facility];  Service: Gastroenterology;  Laterality: N/A;   ESOPHAGOGASTRODUODENOSCOPY (EGD) WITH PROPOFOL N/A 12/18/2022   Procedure: ESOPHAGOGASTRODUODENOSCOPY (EGD) WITH PROPOFOL;  Surgeon: Jeani Hawking, MD;  Location: [facility];  Service: Gastroenterology;  Laterality: N/A;   ESOPHAGOGASTRODUODENOSCOPY (EGD) WITH PROPOFOL N/A 12/30/2022   Procedure: ESOPHAGOGASTRODUODENOSCOPY (EGD) WITH PROPOFOL;  Surgeon: Jeani Hawking, MD;  Location: [facility];  Service:  Gastroenterology;  Laterality: N/A;   EUS N/A 12/18/2022   Procedure: UPPER ENDOSCOPIC ULTRASOUND (EUS) LINEAR;  Surgeon: Jeani Hawking, MD;  Location: [facility];  Service: Gastroenterology;  Laterality: N/A;   FINE NEEDLE ASPIRATION N/A 12/18/2022   Procedure: FINE NEEDLE ASPIRATION (FNA) LINEAR;  Surgeon: Jeani Hawking, MD;  Location: [facility];  Service: Gastroenterology;  Laterality: N/A;   FINE NEEDLE ASPIRATION  12/30/2022   Procedure: FINE NEEDLE ASPIRATION (FNA) LINEAR;  Surgeon: Jeani Hawking, MD;  Location: [facility] ENDOSCOPY;  Service: Gastroenterology;;   LAPAROSCOPIC ABDOMINAL EXPLORATION     SPHINCTEROTOMY  12/17/2022   Procedure: Dennison Mascot;  Surgeon: Jeani Hawking, MD;  Location: [facility];  Service: Gastroenterology;;   Francine Graven REMOVAL  04/05/2023   Procedure: STENT REMOVAL;  Surgeon: Jeani Hawking, MD;  Location: [facility];  Service: Gastroenterology;;   UPPER ESOPHAGEAL ENDOSCOPIC ULTRASOUND (EUS) N/A 12/30/2022   Procedure: UPPER ESOPHAGEAL ENDOSCOPIC ULTRASOUND (EUS);  Surgeon: Jeani Hawking, MD;  Location: [facility] END[facility]COPY;  Service: Gastroenterology;  Laterality: N/A;   Social History   Tobacco Use   Smoking status: Never   Smokeless tobacco: Never  Substance Use Topics   Alcohol use: Yes    Comment: occ   Drug use: Never   Family History  Problem Relation Age of Onset   Diabetes Mother    Stroke Mother    Lung cancer Father 37       smoked   Diabetes Father    Diabetes Sister    Breast cancer Sister 6   Diabetes Brother    Cancer Brother 60       neck cancer   Heart disease Maternal Grandmother    Breast cancer Maternal Grandmother        dx. <50, double mastectomy   Heart disease Maternal Grandfather    Allergies  Allergen Reactions   Other Nausea Only and Other (See Comments)    Certain types of Anesthesia cause SEVERE NAUSEA   Codeine Nausea Only   Hydrocodone-Acetaminophen Other (See Comments)   Meloxicam Nausea Only   Tramadol Nausea Only    Current Outpatient Medications on File Prior to Visit  Medication Sig Dispense Refill   cetirizine (ZYRTEC) 10 MG chewable tablet Chew 10 mg by mouth daily. As needed     CREON 24000-76000 units CPEP Take 1 capsule by mouth before each meal, and take 1 capsule before snacks.     NON FORMULARY Apply 1 application  topically See admin instructions. Hot Cream  Muscle & Joint Cream/Capsaicin- based- Apply to painful or itchy areas as directed/as needed for relief     omeprazole (PRILOSEC) 20 MG capsule TAKE 1 CAPSULE(20 MG) BY MOUTH DAILY (Patient taking differently: Take 20 mg by mouth 2 (two) times daily before a meal.) 90 capsule 1   No current facility-administered medications on file prior to visit.    Review of Systems  Constitutional:  Positive for fatigue.       Hungry and eating  Cannot gain weight in setting of pancreatic cancer   Gastrointestinal:  Positive for diarrhea.  Musculoskeletal:  Positive for arthralgias.       Left thumb pain        Objective:   Physical Exam Constitutional:      General: She is not in acute distress.    Appearance: Normal appearance. She is normal weight. She is not ill-appearing.     Comments: Weight loss noted   HENT:     Mouth/Throat:     Mouth: Mucous membranes are moist.  Eyes:     Conjunctiva/sclera: Conjunctivae normal.     Pupils: Pupils are equal, round, and reactive to light.  Cardiovascular:     Rate and Rhythm: Regular rhythm.  Musculoskeletal:     Comments: Left hand Tender over CMC joint and superior to it  Some mild swelling dorsally without rash or bruising No crepitus  Adduction is limited by pain / along with grip  No triggering of thumb or other fingers   No warmth or erythema  No wrist tenderness   Skin:    General: Skin is warm and dry.     Findings: No bruising, erythema or rash.  Neurological:     Mental Status: She is alert.     Sensory: No sensory deficit.  Psychiatric:        Mood and Affect: Mood  normal.           Assessment & Plan:   Problem List Items Addressed This Visit       Other   Pain of left thumb - Primary   Left hand  Primarily CMC joint , worse with movement but can bother her during rest  Tender  Mildly swollen  No trauma, no neuro symptoms  Has current pancreatic cancer   Xray today notes arthitis of first CMC joint -not unexpected  Recommend trial of voltaren gel up to QID Cold compress Compression if helpful  Wrist splint if helpful  If this does not improve symptoms consider ortho/hand consult  Update if not starting to improve in a week or if worsening   Call back and Er precautions noted in detail today        Relevant Orders   DG Hand Complete Left (Completed)

## 2023-08-25 NOTE — Assessment & Plan Note (Addendum)
 Left hand  Primarily CMC joint , worse with movement but can bother her during rest  Tender  Mildly swollen  No trauma, no neuro symptoms  Has current pancreatic cancer   Xray today notes arthitis of first CMC joint -not unexpected  Recommend trial of voltaren gel up to QID Cold compress Compression if helpful  Wrist splint if helpful  If this does not improve symptoms consider ortho/hand consult  Update if not starting to improve in a week or if worsening   Call back and Er precautions noted in detail today

## 2023-08-25 NOTE — Patient Instructions (Addendum)
 Use a cold compress on painful thumb area as often as you can for 10 minutes   Avoid activities that hurt   Compression may help / from a wrist splint or ace bandage  Get voltaren gel over the counter 1% and apply to affected area up to 4 times daily    Xray today  We will reach out with result and plan   If symptoms worsen in meantime let us know

## 2023-09-28 DIAGNOSIS — E8809 Other disorders of plasma-protein metabolism, not elsewhere classified: Secondary | ICD-10-CM | POA: Diagnosis not present

## 2023-09-28 DIAGNOSIS — Z87891 Personal history of nicotine dependence: Secondary | ICD-10-CM | POA: Diagnosis not present

## 2023-09-28 DIAGNOSIS — K769 Liver disease, unspecified: Secondary | ICD-10-CM | POA: Diagnosis not present

## 2023-09-28 DIAGNOSIS — K766 Portal hypertension: Secondary | ICD-10-CM | POA: Diagnosis not present

## 2023-09-28 DIAGNOSIS — K6389 Other specified diseases of intestine: Secondary | ICD-10-CM | POA: Diagnosis not present

## 2023-09-28 DIAGNOSIS — Z923 Personal history of irradiation: Secondary | ICD-10-CM | POA: Diagnosis not present

## 2023-09-28 DIAGNOSIS — D61818 Other pancytopenia: Secondary | ICD-10-CM | POA: Diagnosis not present

## 2023-09-28 DIAGNOSIS — C250 Malignant neoplasm of head of pancreas: Secondary | ICD-10-CM | POA: Diagnosis not present

## 2023-09-28 DIAGNOSIS — R197 Diarrhea, unspecified: Secondary | ICD-10-CM | POA: Diagnosis not present

## 2023-09-29 DIAGNOSIS — D61818 Other pancytopenia: Secondary | ICD-10-CM | POA: Diagnosis not present

## 2023-09-29 DIAGNOSIS — K819 Cholecystitis, unspecified: Secondary | ICD-10-CM | POA: Diagnosis not present

## 2023-09-29 DIAGNOSIS — Z87891 Personal history of nicotine dependence: Secondary | ICD-10-CM | POA: Diagnosis not present

## 2023-09-29 DIAGNOSIS — Z923 Personal history of irradiation: Secondary | ICD-10-CM | POA: Diagnosis not present

## 2023-09-29 DIAGNOSIS — Z9221 Personal history of antineoplastic chemotherapy: Secondary | ICD-10-CM | POA: Diagnosis not present

## 2023-09-29 DIAGNOSIS — R161 Splenomegaly, not elsewhere classified: Secondary | ICD-10-CM | POA: Diagnosis not present

## 2023-09-29 DIAGNOSIS — C250 Malignant neoplasm of head of pancreas: Secondary | ICD-10-CM | POA: Diagnosis not present

## 2023-09-29 DIAGNOSIS — D696 Thrombocytopenia, unspecified: Secondary | ICD-10-CM | POA: Diagnosis not present

## 2023-10-07 ENCOUNTER — Telehealth: Payer: Self-pay | Admitting: Family Medicine

## 2023-10-07 DIAGNOSIS — C250 Malignant neoplasm of head of pancreas: Secondary | ICD-10-CM | POA: Diagnosis not present

## 2023-10-07 NOTE — Telephone Encounter (Signed)
-----   Message from [facility] sent at 10/06/2023  1:00 PM EDT ----- Regarding: RE: Patient wants labs here I don't see anything in my in box  Also we are discouraged from doing other clinic's orders for legal reasons ----- Message ----- From: Nicole Johnson Sent: 10/06/2023  12:33 PM EDT To: Clemens Curt, MD Subject: Patient wants labs here                        Patient came in wanting labs for [facility], she said they faxed orders to you. I first told her we only do [facility] labs, but I would let you know. Patient is wanting a call back. Thanks

## 2023-10-08 ENCOUNTER — Other Ambulatory Visit: Payer: Self-pay

## 2023-10-08 ENCOUNTER — Inpatient Hospital Stay (HOSPITAL_COMMUNITY)

## 2023-10-08 ENCOUNTER — Inpatient Hospital Stay (HOSPITAL_COMMUNITY)
Admission: EM | Admit: 2023-10-08 | Discharge: 2023-10-12 | DRG: 377 | Disposition: A | Discharge status: Home / Self Care | Attending: Family Medicine | Admitting: Family Medicine

## 2023-10-08 ENCOUNTER — Encounter (HOSPITAL_COMMUNITY): Payer: Self-pay

## 2023-10-08 DIAGNOSIS — C259 Malignant neoplasm of pancreas, unspecified: Secondary | ICD-10-CM | POA: Diagnosis not present

## 2023-10-08 DIAGNOSIS — Z79899 Other long term (current) drug therapy: Secondary | ICD-10-CM | POA: Diagnosis not present

## 2023-10-08 DIAGNOSIS — I959 Hypotension, unspecified: Secondary | ICD-10-CM | POA: Diagnosis not present

## 2023-10-08 DIAGNOSIS — K921 Melena: Secondary | ICD-10-CM | POA: Diagnosis present

## 2023-10-08 DIAGNOSIS — R1900 Intra-abdominal and pelvic swelling, mass and lump, unspecified site: Secondary | ICD-10-CM | POA: Diagnosis not present

## 2023-10-08 DIAGNOSIS — D509 Iron deficiency anemia, unspecified: Secondary | ICD-10-CM | POA: Diagnosis present

## 2023-10-08 DIAGNOSIS — Z6822 Body mass index (BMI) 22.0-22.9, adult: Secondary | ICD-10-CM

## 2023-10-08 DIAGNOSIS — Z8249 Family history of ischemic heart disease and other diseases of the circulatory system: Secondary | ICD-10-CM | POA: Diagnosis not present

## 2023-10-08 DIAGNOSIS — Z923 Personal history of irradiation: Secondary | ICD-10-CM

## 2023-10-08 DIAGNOSIS — R609 Edema, unspecified: Secondary | ICD-10-CM | POA: Diagnosis not present

## 2023-10-08 DIAGNOSIS — D61818 Other pancytopenia: Secondary | ICD-10-CM | POA: Diagnosis not present

## 2023-10-08 DIAGNOSIS — C250 Malignant neoplasm of head of pancreas: Secondary | ICD-10-CM | POA: Diagnosis not present

## 2023-10-08 DIAGNOSIS — K922 Gastrointestinal hemorrhage, unspecified: Secondary | ICD-10-CM | POA: Diagnosis not present

## 2023-10-08 DIAGNOSIS — K8689 Other specified diseases of pancreas: Secondary | ICD-10-CM | POA: Diagnosis not present

## 2023-10-08 DIAGNOSIS — R161 Splenomegaly, not elsewhere classified: Secondary | ICD-10-CM | POA: Diagnosis present

## 2023-10-08 DIAGNOSIS — E039 Hypothyroidism, unspecified: Secondary | ICD-10-CM | POA: Diagnosis not present

## 2023-10-08 DIAGNOSIS — E43 Unspecified severe protein-calorie malnutrition: Secondary | ICD-10-CM | POA: Diagnosis not present

## 2023-10-08 DIAGNOSIS — D649 Anemia, unspecified: Secondary | ICD-10-CM | POA: Diagnosis present

## 2023-10-08 DIAGNOSIS — Z833 Family history of diabetes mellitus: Secondary | ICD-10-CM | POA: Diagnosis not present

## 2023-10-08 DIAGNOSIS — K3189 Other diseases of stomach and duodenum: Secondary | ICD-10-CM | POA: Diagnosis present

## 2023-10-08 DIAGNOSIS — D500 Iron deficiency anemia secondary to blood loss (chronic): Secondary | ICD-10-CM | POA: Diagnosis not present

## 2023-10-08 DIAGNOSIS — Z888 Allergy status to other drugs, medicaments and biological substances status: Secondary | ICD-10-CM

## 2023-10-08 DIAGNOSIS — K766 Portal hypertension: Secondary | ICD-10-CM | POA: Diagnosis not present

## 2023-10-08 DIAGNOSIS — R7303 Prediabetes: Secondary | ICD-10-CM | POA: Diagnosis not present

## 2023-10-08 DIAGNOSIS — E871 Hypo-osmolality and hyponatremia: Secondary | ICD-10-CM | POA: Diagnosis not present

## 2023-10-08 DIAGNOSIS — K219 Gastro-esophageal reflux disease without esophagitis: Secondary | ICD-10-CM | POA: Diagnosis present

## 2023-10-08 DIAGNOSIS — I871 Compression of vein: Secondary | ICD-10-CM | POA: Diagnosis not present

## 2023-10-08 DIAGNOSIS — K625 Hemorrhage of anus and rectum: Secondary | ICD-10-CM | POA: Diagnosis not present

## 2023-10-08 DIAGNOSIS — E440 Moderate protein-calorie malnutrition: Secondary | ICD-10-CM | POA: Diagnosis present

## 2023-10-08 DIAGNOSIS — Z885 Allergy status to narcotic agent status: Secondary | ICD-10-CM

## 2023-10-08 DIAGNOSIS — I700 Atherosclerosis of aorta: Secondary | ICD-10-CM | POA: Diagnosis not present

## 2023-10-08 HISTORY — DX: Malignant (primary) neoplasm, unspecified: C80.1

## 2023-10-08 LAB — CBC WITH DIFFERENTIAL/PLATELET
Abs Immature Granulocytes: 0 10*3/uL (ref 0.00–0.07)
Basophils Absolute: 0 10*3/uL (ref 0.0–0.1)
Basophils Relative: 1 %
Eosinophils Absolute: 0 10*3/uL (ref 0.0–0.5)
Eosinophils Relative: 2 %
HCT: 22.9 % — ABNORMAL LOW (ref 36.0–46.0)
Hemoglobin: 7 g/dL — ABNORMAL LOW (ref 12.0–15.0)
Immature Granulocytes: 0 %
Lymphocytes Relative: 15 %
Lymphs Abs: 0.2 10*3/uL — ABNORMAL LOW (ref 0.7–4.0)
MCH: 30.3 pg (ref 26.0–34.0)
MCHC: 30.6 g/dL (ref 30.0–36.0)
MCV: 99.1 fL (ref 80.0–100.0)
Monocytes Absolute: 0.1 10*3/uL (ref 0.1–1.0)
Monocytes Relative: 10 %
Neutro Abs: 0.9 10*3/uL — ABNORMAL LOW (ref 1.7–7.7)
Neutrophils Relative %: 72 %
Platelets: 94 10*3/uL — ABNORMAL LOW (ref 150–400)
RBC: 2.31 MIL/uL — ABNORMAL LOW (ref 3.87–5.11)
RDW: 13.9 % (ref 11.5–15.5)
WBC: 1.2 10*3/uL — CL (ref 4.0–10.5)
nRBC: 0 % (ref 0.0–0.2)

## 2023-10-08 LAB — I-STAT CHEM 8, ED
BUN: 8 mg/dL (ref 8–23)
Calcium, Ion: 1.22 mmol/L (ref 1.15–1.40)
Chloride: 101 mmol/L (ref 98–111)
Creatinine, Ser: 0.9 mg/dL (ref 0.44–1.00)
Glucose, Bld: 78 mg/dL (ref 70–99)
HCT: 20 % — ABNORMAL LOW (ref 36.0–46.0)
Hemoglobin: 6.8 g/dL — CL (ref 12.0–15.0)
Potassium: 4 mmol/L (ref 3.5–5.1)
Sodium: 137 mmol/L (ref 135–145)
TCO2: 23 mmol/L (ref 22–32)

## 2023-10-08 LAB — COMPREHENSIVE METABOLIC PANEL WITH GFR
ALT: 31 U/L (ref 0–44)
AST: 28 U/L (ref 15–41)
Albumin: 2.7 g/dL — ABNORMAL LOW (ref 3.5–5.0)
Alkaline Phosphatase: 172 U/L — ABNORMAL HIGH (ref 38–126)
Anion gap: 7 (ref 5–15)
BUN: 10 mg/dL (ref 8–23)
CO2: 25 mmol/L (ref 22–32)
Calcium: 8.4 mg/dL — ABNORMAL LOW (ref 8.9–10.3)
Chloride: 102 mmol/L (ref 98–111)
Creatinine, Ser: 0.6 mg/dL (ref 0.44–1.00)
GFR, Estimated: >60 mL/min (ref >60)
Glucose, Bld: 83 mg/dL (ref 70–99)
Potassium: 3.9 mmol/L (ref 3.5–5.1)
Sodium: 134 mmol/L — ABNORMAL LOW (ref 135–145)
Total Bilirubin: 0.4 mg/dL (ref 0.0–1.2)
Total Protein: 5.8 g/dL — ABNORMAL LOW (ref 6.5–8.1)

## 2023-10-08 LAB — HEMOGLOBIN AND HEMATOCRIT, BLOOD
HCT: 22.8 % — ABNORMAL LOW (ref 36.0–46.0)
Hemoglobin: 7.2 g/dL — ABNORMAL LOW (ref 12.0–15.0)

## 2023-10-08 LAB — PROTIME-INR
INR: 1.1 (ref 0.8–1.2)
Prothrombin Time: 14.4 s (ref 11.4–15.2)

## 2023-10-08 LAB — ABO/RH: ABO/RH(D): O POS

## 2023-10-08 LAB — PREPARE RBC (CROSSMATCH)

## 2023-10-08 LAB — MRSA NEXT GEN BY PCR, NASAL: MRSA by PCR Next Gen: NOT DETECTED

## 2023-10-08 LAB — POC OCCULT BLOOD, ED: Fecal Occult Blood, POC: POSITIVE — AB

## 2023-10-08 MED ORDER — ORAL CARE MOUTH RINSE: 15.0000 mL | OROMUCOSAL | Status: DC | PRN

## 2023-10-08 MED ORDER — ONDANSETRON HCL 4 MG PO TABS: 4.0000 mg | ORAL_TABLET | Freq: Four times a day (QID) | ORAL | Status: DC | PRN

## 2023-10-08 MED ORDER — PANTOPRAZOLE SODIUM 40 MG IV SOLR
40.0000 mg | INTRAVENOUS | Status: AC
  Administered 2023-10-08 (×2): 40 mg via INTRAVENOUS
  Filled 2023-10-08 (×2): qty 10

## 2023-10-08 MED ORDER — ACETAMINOPHEN 650 MG RE SUPP: 650.0000 mg | Freq: Four times a day (QID) | RECTAL | Status: DC | PRN

## 2023-10-08 MED ORDER — SODIUM CHLORIDE 0.9% FLUSH
10.0000 mL | Freq: Two times a day (BID) | INTRAVENOUS | Status: DC
  Administered 2023-10-08: 10 mL
  Administered 2023-10-09: 20 mL
  Administered 2023-10-09 – 2023-10-11 (×4): 10 mL
  Administered 2023-10-11: 20 mL
  Administered 2023-10-12: 10 mL

## 2023-10-08 MED ORDER — SODIUM CHLORIDE 0.9% IV SOLUTION: Freq: Once | INTRAVENOUS | Status: AC

## 2023-10-08 MED ORDER — BOOST / RESOURCE BREEZE PO LIQD CUSTOM
1.0000 | Freq: Three times a day (TID) | ORAL | Status: DC
Start: 2023-10-09 — End: 2023-10-09

## 2023-10-08 MED ORDER — IOHEXOL 350 MG/ML SOLN
100.0000 mL | Freq: Once | INTRAVENOUS | Status: AC | PRN
  Administered 2023-10-08: 100 mL via INTRAVENOUS

## 2023-10-08 MED ORDER — ONDANSETRON HCL 4 MG/2ML IJ SOLN: 4.0000 mg | Freq: Four times a day (QID) | INTRAMUSCULAR | Status: DC | PRN

## 2023-10-08 MED ORDER — ACETAMINOPHEN 325 MG PO TABS: 650.0000 mg | ORAL_TABLET | Freq: Four times a day (QID) | ORAL | Status: DC | PRN

## 2023-10-08 MED ORDER — SODIUM CHLORIDE 0.9% FLUSH: 10.0000 mL | INTRAVENOUS | Status: DC | PRN

## 2023-10-08 MED ORDER — CHLORHEXIDINE GLUCONATE CLOTH 2 % EX PADS
6.0000 | MEDICATED_PAD | Freq: Every day | CUTANEOUS | Status: DC
  Administered 2023-10-08 – 2023-10-12 (×5): 6 via TOPICAL

## 2023-10-08 MED ORDER — PANTOPRAZOLE SODIUM 40 MG IV SOLR
40.0000 mg | Freq: Two times a day (BID) | INTRAVENOUS | Status: DC
  Administered 2023-10-09 – 2023-10-12 (×8): 40 mg via INTRAVENOUS
  Filled 2023-10-08 (×8): qty 10

## 2023-10-08 NOTE — ED Provider Notes (Signed)
 [place] EMERGENCY DEPARTMENT AT [facility] Provider Note   CSN: [id number] Arrival date & time: 10/08/23  1104     History  Chief Complaint  Patient presents with   GI Bleeding   HPI Nicole Johnson is a 63 y.o. female with pancreatic cancer, GERD presenting for bloody stools.  States she noticed them about a week ago.  The blood is bright red and at times she "fills up the toilet bowl" with apparent blood.  Denies pain with bowel movements or abdominal pain.  She states she is more lightheaded than usual but denies fatigue or shortness of breath and chest pain.  She reports a baseline anemia but has noted that her hemoglobin level has been lower than usual in the last 2 checks.  Most recently it was 7.3.  Reports that Dr. Alvis Jourdain is her "local GI doctor".  HPI     Home Medications Prior to Admission medications   Medication Sig Start Date End Date Taking? Authorizing Provider  cetirizine (ZYRTEC) 10 MG chewable tablet Chew 10 mg by mouth daily. As needed   Yes [provider]  CREON 24000-76000 units CPEP Take 1 capsule by mouth before each meal, and take 1 capsule before snacks. 02/25/23  Yes [provider]  omeprazole  (PRILOSEC) 20 MG capsule TAKE 1 CAPSULE(20 MG) BY MOUTH DAILY Patient taking differently: Take 20 mg by mouth 2 (two) times daily before a meal. 11/22/19  Yes [name], FNP  Probiotic Product (PROBIOTIC DAILY PO) Take 1 tablet by mouth daily.   Yes [provider]      Allergies    Other, Codeine, Hydrocodone -acetaminophen , Meloxicam, and Tramadol    Review of Systems   See HPI  Physical Exam Updated Vital Signs BP 98/69   Pulse 78   Temp (!) 97.5 F (36.4 C) (Oral)   Resp 18   Ht 5\' 5"  (1.651 m)   Wt 61.2 kg   LMP 01/11/2011   SpO2 100%   BMI 22.47 kg/m  Physical Exam Vitals and nursing note reviewed. Exam conducted with a chaperone present.  HENT:     Head: Normocephalic and atraumatic.      Mouth/Throat:     Mouth: Mucous membranes are moist.  Eyes:     General:        Right eye: No discharge.        Left eye: No discharge.     Conjunctiva/sclera: Conjunctivae normal.  Cardiovascular:     Rate and Rhythm: Normal rate and regular rhythm.     Pulses: Normal pulses.     Heart sounds: Normal heart sounds.  Pulmonary:     Effort: Pulmonary effort is normal.     Breath sounds: Normal breath sounds.  Abdominal:     General: Abdomen is flat.     Palpations: Abdomen is soft.     Tenderness: There is no abdominal tenderness.  Genitourinary:    Rectum: Guaiac result positive. No mass, tenderness, anal fissure or external hemorrhoid. Normal anal tone.  Skin:    General: Skin is warm and dry.     Coloration: Skin is pale.  Neurological:     General: No focal deficit present.  Psychiatric:        Mood and Affect: Mood normal.     ED Results / Procedures / Treatments   Labs (all labs ordered are listed, but only abnormal results are displayed) Labs Reviewed  COMPREHENSIVE METABOLIC PANEL WITH GFR - Abnormal; Notable  for the following components:      Result Value   Sodium 134 (*)    Calcium 8.4 (*)    Total Protein 5.8 (*)    Albumin 2.7 (*)    Alkaline Phosphatase 172 (*)    All other components within normal limits  CBC WITH DIFFERENTIAL/PLATELET - Abnormal; Notable for the following components:   WBC 1.2 (*)    RBC 2.31 (*)    Hemoglobin 7.0 (*)    HCT 22.9 (*)    Platelets 94 (*)    Neutro Abs 0.9 (*)    Lymphs Abs 0.2 (*)    All other components within normal limits  I-STAT CHEM 8, ED - Abnormal; Notable for the following components:   Hemoglobin 6.8 (*)    HCT 20.0 (*)    All other components within normal limits  POC OCCULT BLOOD, ED - Abnormal; Notable for the following components:   Fecal Occult Blood, POC Positive (*)    All other components within normal limits  PROTIME-INR  HEMOGLOBIN AND HEMATOCRIT, BLOOD  HEMOGLOBIN AND HEMATOCRIT, BLOOD  TYPE  AND SCREEN  ABO/RH  PREPARE RBC (CROSSMATCH)    EKG None  Radiology No results found.  Procedures .Critical Care  Performed by: Janalee Mcmurray, PA-C Authorized by: Janalee Mcmurray, PA-C   Critical care provider statement:    Critical care time (minutes):  30   Critical care was necessary to treat or prevent imminent or life-threatening deterioration of the following conditions: GI bleed with anemia requiring blood tranfusion.   Critical care was time spent personally by me on the following activities:  Development of treatment plan with patient or surrogate, discussions with consultants, evaluation of patient's response to treatment, examination of patient, ordering and review of laboratory studies, ordering and review of radiographic studies, ordering and performing treatments and interventions, pulse oximetry, re-evaluation of patient's condition and review of old charts     Medications Ordered in ED Medications  pantoprazole  (PROTONIX ) injection 40 mg (40 mg Intravenous Given 10/08/23 1336)    Followed by  pantoprazole  (PROTONIX ) injection 40 mg (has no administration in time range)  0.9 %  sodium chloride  infusion (Manually program via Guardrails IV Fluids) (has no administration in time range)  acetaminophen  (TYLENOL ) tablet 650 mg (has no administration in time range)    Or  acetaminophen  (TYLENOL ) suppository 650 mg (has no administration in time range)  ondansetron  (ZOFRAN ) tablet 4 mg (has no administration in time range)    Or  ondansetron  (ZOFRAN ) injection 4 mg (has no administration in time range)    ED Course/ Medical Decision Making/ A&P                                 Medical Decision Making Amount and/or Complexity of Data Reviewed Labs: ordered.  Risk Prescription drug management. Decision regarding hospitalization.   Initial Impression and Ddx 63 year old well-appearing female presenting for GI bleed. Exam notable for positive Hemoccult, pale  but otherwise reassuring.  DDx includes upper versus lower GI bleed, symptomatic anemia, AKI, intra-abdominal infection, other. Patient PMH that increases complexity of ED encounter: pancreatic cancer, GERD  Interpretation of Diagnostics - I independent reviewed and interpreted the labs as followed: hgb 6.8, leukopenia (1.2), fecal occult positive  Patient Reassessment and Ultimate Disposition/Management Remained hemodynamically stable no acute distress and well-appearing.  Suspect lower GI bleed but given history of GERD, went ahead and started PPI  bolus and infusion.  Also ordered 1 unit of blood.  Discussed patient with Maureen Sour the NP with [facility] who advised that they would see her during her admission.  Admitted to hospital service with Dr. Lucienne Ryder for further management.  Patient management required discussion with the following services or consulting groups:  Hospitalist Service  Complexity of Problems Addressed Acute complicated illness or Injury  Additional Data Reviewed and Analyzed Further history obtained from: Past medical history and medications listed in the EMR and Prior ED visit notes  Patient Encounter Risk Assessment Consideration of hospitalization         Final Clinical Impression(s) / ED Diagnoses Final diagnoses:  Gastrointestinal hemorrhage, unspecified gastrointestinal hemorrhage type    Rx / DC Orders ED Discharge Orders     None         Janalee Mcmurray, PA-C 10/08/23 1426    Wynetta Heckle, MD 10/12/23 1546

## 2023-10-08 NOTE — Consult Note (Signed)
 Inpatient Consultation   Referring Provider:      Primary Care Physician:  Tower, Manley Seeds, [facility] Primary Gastroenterologist:       Dr. Alvis Johnson  Reason for Consultation:     Hematochezia, anemia         HPI  [facility] is a 63 y.o. female with locally advanced unresectable pancreatic cancer status post chemorads (fall 2024), perforated cholecystitis status post cholecystostomy tube and stenting (05/2023), pancytopenia, suspected pancreatic insufficiency, colitis versus portal colopathy on CT imaging admitted with a 1 week history of progressive hematochezia and anemia hemoglobin 7.0 (baseline hgb 9-11).   [facility] reports that she began noting symptoms of hematochezia on 10/01/2023.  She states that at the time of onset of hematochezia she had increased her Creon dosing.  Initial bleeding was described as being bright red and small in volume.  She contacted her hematology/oncology providers at [facility] who advised that she continue to monitor her symptoms.  Over the course of the week, her hematochezia continued and then became more voluminous yesterday with associated clots prompting her to present to the emergency department.  Endorses intermittent mild abdominal cramping but no significant pain.  States the bleeding has generally been painless.  She is not on anticoagulants.  No NSAIDs.  No prior history of bleeding diathesis.  No fevers, chills, nausea or vomiting.  States that since being admitted to the hospital this morning she has not had further rectal bleeding.  At the time of my interview she was receiving a unit of blood and reported feeling stable.  Last colonoscopy 11/10/2021 -normal  Dual phase pancreatic CT 09/28/2023 at [facility] -cystic lesion of biloma favoring biloma, locally advanced pancreatic cancer stable, sequelae of portal hypertension -diffuse colonic thickening may relate to portal colopathy versus colitis and peritoneal nodule  ED course: Afebrile and hemodynamically  stable Labs: WBC 1.2, Hgb 7, HCT 22.9, platelets 94 CMP overall normal except TP 5.8, albumin 2.7, alk phos 172-other LFTs normal PT 14.4, INR 1.1   Past Medical History:  Diagnosis Date   Arthritis    Cancer (HCC)    pancreatic   GERD (gastroesophageal reflux disease)    PONV (postoperative nausea and vomiting)    Prediabetes     Past Surgical History:  Procedure Laterality Date   BILIARY BRUSHING  12/17/2022   Procedure: BILIARY BRUSHING;  Surgeon: [facility] Jourdain, [facility];  Location: [facility];  Service: Gastroenterology;;   BILIARY STENT PLACEMENT N/A 12/17/2022   Procedure: BILIARY STENT PLACEMENT;  Surgeon: [facility]dain, [facility];  Location: [facility];  Service: Gastroenterology;  Laterality: N/A;   BILIARY STENT PLACEMENT N/A 04/05/2023   Procedure: BILIARY STENT PLACEMENT;  Surgeon: [facility] Jourdain, [facility];  Location: [facility];  Service: Gastroenterology;  Laterality: N/A;   ERCP N/A 12/17/2022   Procedure: ENDOSCOPIC RETROGRADE CHOLANGIOPANCREATOGRAPHY (ERCP);  Surgeon: [facility]dain, [facility];  Location: [facility];  Service: Gastroenterology;  Laterality: N/A;   ERCP N/A 04/05/2023   Procedure: ENDOSCOPIC RETROGRADE CHOLANGIOPANCREATOGRAPHY (ERCP);  Surgeon: [facility] Jourdain, [facility];  Location: [facility];  Service: Gastroenterology;  Laterality: N/A;   ESOPHAGOGASTRODUODENOSCOPY (EGD) WITH PROPOFOL  N/A 12/18/2022   Procedure: ESOPHAGOGASTRODUODENOSCOPY (EGD) WITH PROPOFOL ;  Surgeon: [facility] Jourdain, [facility];  Location: [facility];  Service: Gastroenterology;  Laterality: N/A;   ESOPHAGOGASTRODUODENOSCOPY (EGD) WITH PROPOFOL  N/A 12/30/2022   Procedure: ESOPHAGOGASTRODUODENOSCOPY (EGD) WITH PROPOFOL ;  Surgeon: [facility] Jourdain, [facility];  Location: [facility];  Service: Gastroenterology;  Laterality: N/A;   EUS N/A 12/18/2022   Procedure: UPPER ENDOSCOPIC  ULTRASOUND (EUS) LINEAR;  Surgeon: [facility] Jourdain, [facility];  Location: [facility];  Service: Gastroenterology;  Laterality: N/A;   FINE NEEDLE ASPIRATION N/A  12/18/2022   Procedure: FINE NEEDLE ASPIRATION (FNA) LINEAR;  Surgeon: [facility] Jourdain, [facility];  Location: [facility];  Service: Gastroenterology;  Laterality: N/A;   FINE NEEDLE ASPIRATION  12/30/2022   Procedure: FINE NEEDLE ASPIRATION (FNA) LINEAR;  Surgeon: [facility] Jourdain, [facility];  Location: [facility];  Service: Gastroenterology;;   LAPAROSCOPIC ABDOMINAL EXPLORATION     SPHINCTEROTOMY  12/17/2022   Procedure: [facility];  Surgeon: Nicole Jourdain, [facility];  Location: [facility];  Service: Gastroenterology;;   Yuvonne Herald REMOVAL  04/05/2023   Procedure: STENT REMOVAL;  Surgeon: [facility] Jourdain, [facility];  Location: [facility];  Service: Gastroenterology;;   UPPER ESOPHAGEAL ENDOSCOPIC ULTRASOUND (EUS) N/A 12/30/2022   Procedure: UPPER ESOPHAGEAL ENDOSCOPIC ULTRASOUND (EUS);  Surgeon: [facility] Jourdain, [facility];  Location: [facility];  Service: Gastroenterology;  Laterality: N/A;    Family History  Problem Relation Age of Onset   Diabetes Mother    Stroke Mother    Lung cancer Father 29       smoked   Diabetes Father    Diabetes Sister    Breast cancer Sister 89   Diabetes Brother    Cancer Brother 60       neck cancer   Heart disease Maternal Grandmother    Breast cancer Maternal Grandmother        dx. <50, double mastectomy   Heart disease Maternal Grandfather     Social History   Tobacco Use   Smoking status: Never   Smokeless tobacco: Never  Vaping Use   Vaping status: Never Used  Substance Use Topics   Alcohol  use: Not Currently    Comment: occ   Drug use: Never    Prior to Admission medications   Medication Sig Start Date End Date Taking? Authorizing Provider  cetirizine (ZYRTEC) 10 MG chewable tablet Chew 10 mg by mouth daily. As needed   Yes [facility]  CREON 24000-76000 units CPEP Take 1 capsule by mouth before each meal, and take 1 capsule before snacks. 02/25/23  Yes [facility]  omeprazole  (PRILOSEC) 20 MG capsule TAKE 1 CAPSULE(20 MG) BY MOUTH DAILY Patient  taking differently: Take 20 mg by mouth 2 (two) times daily before a meal. 11/22/19  Yes [name], FNP  Probiotic Product (PROBIOTIC DAILY PO) Take 1 tablet by mouth daily.   Yes [facility]    Current Facility-Administered Medications  Medication Dose Route Frequency Provider Last Rate Last Admin   acetaminophen  (TYLENOL ) tablet 650 mg  650 mg Oral Q6H PRN Danice Dural, Johnson       Or   acetaminophen  (TYLENOL ) suppository 650 mg  650 mg Rectal Q6H PRN Danice Dural, Johnson       Chlorhexidine  Gluconate Cloth 2 % PADS 6 each  6 each Topical Daily Danice Dural, Johnson   6 each at 10/08/23 1747   ondansetron  (ZOFRAN ) tablet 4 mg  4 mg Oral Q6H PRN Danice Dural, Johnson       Or   ondansetron  (ZOFRAN ) injection 4 mg  4 mg Intravenous Q6H PRN Danice Dural, Johnson       Oral care mouth rinse  15 mL Mouth Rinse PRN Danice Dural, Johnson       [START ON 10/09/2023] pantoprazole (PROTONIX) injection 40 mg  40 mg Intravenous Q12H Robinson, John K, PA-C  sodium chloride  flush (NS) 0.9 % injection 10-40 mL  10-40 mL Intracatheter Q12H Danice Dural, Johnson       sodium chloride  flush (NS) 0.9 % injection 10-40 mL  10-40 mL Intracatheter PRN Danice Dural, Johnson        Allergies as of 10/08/2023 - Review Complete 10/08/2023  Allergen Reaction Noted   Other Nausea Only and Other (See Comments) 12/16/2022   Codeine Nausea Only 03/10/2022   Hydrocodone -acetaminophen  Other (See Comments) 06/02/2023   Meloxicam Nausea Only 12/02/2017   Tramadol Nausea Only 12/02/2017    GI Review of Symptoms Significant for hematochezia and mild abdominal cramping. Otherwise negative.  General Review of Systems  Review of systems is significant for the pertinent positives and negatives as listed per the HPI.  Full ROS is otherwise negative.    Physical Exam  Vital signs in last 24 hours: Temp:  [97.5 F (36.4 C)-98.4 F (36.9 C)] 98.3 F (36.8 C) (05/17  1748) Pulse Rate:  [77-81] 80 (05/17 1748) Resp:  [15-20] 16 (05/17 1748) BP: (98-119)/(60-73) 119/70 (05/17 1748) SpO2:  [99 %-100 %] 99 % (05/17 1748) Weight:  [61.2 kg-61.3 kg] 61.3 kg (05/17 1541) Last BM Date : 10/08/23 General:  NAD, Well developed, Well nourished, alert and cooperative Head:  Normocephalic and atraumatic. Eyes:   PEERL, EOMI. No icterus. Conjunctiva pink. Lungs: Respirations even and unlabored. Lungs clear to auscultation bilaterally.   No wheezes, crackles, or rhonchi.  Heart: Normal S1, S2. No MRG. Regular rate and rhythm. No peripheral edema, cyanosis or pallor.  Abdomen:  Soft, nondistended, nontender. No rebound or guarding. Normal bowel sounds. No appreciable masses or hepatomegaly. Rectal:  Deferred  Msk:  Symmetrical without gross deformities. Skin:   Dry and intact without significant lesions or rashes.    Lab Results Recent Labs    10/08/23 1216 10/08/23 1227  WBC 1.2*  --   HGB 7.0* 6.8*  HCT 22.9* 20.0*  PLT 94*  --    BMET Recent Labs    10/08/23 1216 10/08/23 1227  NA 134* 137  K 3.9 4.0  CL 102 101  CO2 25  --   GLUCOSE 83 78  BUN 10 8  CREATININE 0.60 0.90  CALCIUM 8.4*  --    LFT Recent Labs    10/08/23 1216  PROT 5.8*  ALBUMIN 2.7*  AST 28  ALT 31  ALKPHOS 172*  BILITOT 0.4   PT/INR Recent Labs    10/08/23 1216  LABPROT 14.4  INR 1.1    Radiographic Studies Dual phase pancreatic CT at [facility] 09/28/2022 Findings:  Chest:  - Chest wall and Thoracic Inlet: No masses or lymphadenopathy.   - Mediastinum and Hila: No masses or lymphadenopathy.   - Thoracic Vessels: Normal caliber of the thoracic aorta and main pulmonary  artery.   - Heart and Pericardium: Normal heart size.  No pericardial effusion.   - Lungs and Airways: No suspicious nodules or opacities.   - Pleura: No pleural effusions.    Abdomen and pelvis:   - Liver: Normal in morphology and enhancement.  Ill-defined  hyperattenuation along the  inferior gallbladder fossa (117) is nonspecific.  Cystic lesion in the central liver measuring 4.1 cm, similar to prior. The  portal and hepatic veins are patent.   - Biliary and Gallbladder: Diffuse pneumobilia and mild ductal dilatation,  similar to prior. Similar tethering of the gallbladder to the anterior  abdominal wall, sequela of prior cholecystostomy tube.   - Spleen:  Enlarged.   - Pancreas: Ill-defined soft tissue throughout the pancreatic head with  attenuation of the common hepatic artery, narrowing of the portal  confluence, and encasement of the SMA, all similar to prior.   - Adrenal Glands: Normal in appearance.   - Kidneys: No suspicious renal lesions. No hydronephrosis.   - Abdominal and Pelvic Vasculature: No abdominal aortic aneurysm. Mild  esophageal varices.   - Gastrointestinal Tract: Diffusely thick-walled colon, most notable in the  right colon.   - Peritoneum/Mesentery/Retroperitoneum: Small volume free fluid.  No free  intraperitoneal air.  Similar nodule along the dependent peritoneum  (303:207).   - Lymph Nodes: No retroperitoneal or mesenteric lymphadenopathy.    - Bladder: Normal in appearance.   - Pelvic Organs: Unremarkable.   - Body Wall: Unremarkable.   - Musculoskeletal:  No aggressive appearing osseous lesions.    Impression:  1. Similar cystic lesion in the liver, favored biloma or abscess.  2.  Similar locally advanced pancreas cancer.  3.  Similar sequela of portal hypertension. Diffuse colonic thickening may  relate in part to portal colopathy versus colitis.  4.  Unchanged dependent peritoneal nodule, suspicious for carcinomatosis.   Endoscopic Studies     Colonoscopy 11/10/2021 Normal  EGD 11/10/2021 3 cm hiatal hernia, multiple gastric polyps  Colonoscopy 09/10/2014 Normal   Clinical Impression   It is my clinical impression that Ms. Carmon is a 63 year old female with locally advanced unresectable pancreatic cancer  status post chemorads (fall 2024), perforated cholecystitis status post cholecystostomy tube and stenting (05/2023), pancytopenia, suspected pancreatic insufficiency, colitis versus portal colopathy on CT imaging admitted with a 1 week history of progressive hematochezia and anemia hemoglobin 7.0 (baseline hgb 9-11).    She is currently hemodynamically stable and receiving her first unit of packed red blood cells.  Given her clinical stability it is less likely that her hematochezia reflects a brisk upper GI bleed.  Reviewed with Ms. Klett and her husband that more than likely she is experiencing a form of lower GI bleeding the differential diagnosis for which includes: Diverticula, AVMs, polyps, colitis versus portal colopathy based upon outside CT at [facility], hemorrhoids.  Ms. Kjos reports that her symptoms seem to occur after increasing her dose of Creon.  Reviewed that Creon is not typically associated with hematochezia or to exacerbate bleeding.  We discussed proceeding with CT angiography for potential localization of lower GI bleeding.  She can continue on clear liquids at this time.  Pending the results of her CT angiography as well as trend in hemoglobin further decisions can be made in her care as to whether or not she will require endoscopic evaluation.   Plan  Recommend scheduling CT angiogram for localization of bleeding May consume a clear liquid diet; patient states that she was told to be n.p.o. after midnight in the event procedures need to be formed which is fine.  If she is stable and there is no need for an urgent endoscopic procedure we can let her resume clear liquids or diet tomorrow. Monitor serial hemoglobin hematocrit and transfuse as appropriate to maintain hemoglobin => 8.0 Monitor hemodynamics Continue pantoprazole for now Ondansetron  as needed for nausea and vomiting Can hold Creon while patient is not consuming solid food  Thank you for your kind consultation, we will  continue to follow.  Scarlette Currier Azadeh Hyder  10/08/2023, 6:41 PM  Eugenia Hess, [facility]

## 2023-10-08 NOTE — ED Triage Notes (Signed)
 Pt with a hx of pancreatic cancer presents with bright red GI bleeding that started 1 week ago. She noticed some clots this AM. She does have watery  diarrhea first thing in the AM with abd cramping which is when she notices the blood. She has soft stool throughout the rest of the day. Pt recently had her Creon dose increased which is when the bleeding started. She does have associated lightheadedness. Denies increased fatigue or ShOB. She had labs yesterday which revealed a Hgb 7.2.

## 2023-10-08 NOTE — Plan of Care (Signed)
 Plan of care and goals discussed with patient, time given for questions, patient Hg 6.8 1 unit of PRBC infusing, will check H&H at 2130, patient continues to have bloody BM's per patient. Vitals within normal limits, patient being taken to CT Scan as per orders.  Problem: Education: Goal: Ability to describe self-care measures that may prevent or decrease complications (Diabetes Survival Skills Education) will improve Outcome: Progressing Goal: Individualized Educational Video(s) Outcome: Progressing   Problem: Coping: Goal: Ability to adjust to condition or change in health will improve Outcome: Progressing   Problem: Fluid Volume: Goal: Ability to maintain a balanced intake and output will improve Outcome: Progressing   Problem: Health Behavior/Discharge Planning: Goal: Ability to identify and utilize available resources and services will improve Outcome: Progressing Goal: Ability to manage health-related needs will improve Outcome: Progressing   Problem: Metabolic: Goal: Ability to maintain appropriate glucose levels will improve Outcome: Progressing   Problem: Nutritional: Goal: Maintenance of adequate nutrition will improve Outcome: Progressing Goal: Progress toward achieving an optimal weight will improve Outcome: Progressing   Problem: Skin Integrity: Goal: Risk for impaired skin integrity will decrease Outcome: Progressing   Problem: Tissue Perfusion: Goal: Adequacy of tissue perfusion will improve Outcome: Progressing   Problem: Education: Goal: Ability to describe self-care measures that may prevent or decrease complications (Diabetes Survival Skills Education) will improve Outcome: Progressing Goal: Individualized Educational Video(s) Outcome: Progressing   Problem: Cardiac: Goal: Ability to maintain an adequate cardiac output will improve Outcome: Progressing   Problem: Health Behavior/Discharge Planning: Goal: Ability to identify and utilize available  resources and services will improve Outcome: Progressing Goal: Ability to manage health-related needs will improve Outcome: Progressing   Problem: Fluid Volume: Goal: Ability to achieve a balanced intake and output will improve Outcome: Progressing   Problem: Metabolic: Goal: Ability to maintain appropriate glucose levels will improve Outcome: Progressing   Problem: Nutritional: Goal: Maintenance of adequate nutrition will improve Outcome: Progressing Goal: Maintenance of adequate weight for body size and type will improve Outcome: Progressing   Problem: Respiratory: Goal: Will regain and/or maintain adequate ventilation Outcome: Progressing   Problem: Urinary Elimination: Goal: Ability to achieve and maintain adequate renal perfusion and functioning will improve Outcome: Progressing   Problem: Education: Goal: Knowledge of General Education information will improve Description: Including pain rating scale, medication(s)/side effects and non-pharmacologic comfort measures Outcome: Progressing   Problem: Health Behavior/Discharge Planning: Goal: Ability to manage health-related needs will improve Outcome: Progressing   Problem: Clinical Measurements: Goal: Ability to maintain clinical measurements within normal limits will improve Outcome: Progressing Goal: Will remain free from infection Outcome: Progressing Goal: Diagnostic test results will improve Outcome: Progressing Goal: Respiratory complications will improve Outcome: Progressing Goal: Cardiovascular complication will be avoided Outcome: Progressing   Problem: Activity: Goal: Risk for activity intolerance will decrease Outcome: Progressing   Problem: Nutrition: Goal: Adequate nutrition will be maintained Outcome: Progressing   Problem: Coping: Goal: Level of anxiety will decrease Outcome: Progressing   Problem: Elimination: Goal: Will not experience complications related to bowel motility Outcome:  Progressing Goal: Will not experience complications related to urinary retention Outcome: Progressing   Problem: Pain Managment: Goal: General experience of comfort will improve and/or be controlled Outcome: Progressing   Problem: Safety: Goal: Ability to remain free from injury will improve Outcome: Progressing   Problem: Skin Integrity: Goal: Risk for impaired skin integrity will decrease Outcome: Progressing

## 2023-10-08 NOTE — ED Provider Notes (Signed)
 I provided a substantive portion of the care of this patient.  I personally made/approved the management plan for this patient and take responsibility for the patient management.     Patient has history of pancreatic cancer.  She has been seeing some bright red blood with bowel movement.  She had a large episode but then did not see more blood.  Today she is more lightheaded than previously but has not had a syncopal episode.  She reports last week her blood count was 7.3 g/dL.  Patient is alert with clear mental status.  Pale in appearance.  No respiratory distress.  Heart regular.  Lungs clear to auscultation.  Abdomen soft.  Lower extremities about 1+ pitting edema.  I agree with plan of management.   Wynetta Heckle, MD 10/08/23 1324

## 2023-10-08 NOTE — H&P (Signed)
 History and Physical    Patient: Nicole Johnson:[id number] DOB: Jul 11, 1960 DOA: 10/08/2023 DOS: the patient was seen and examined on 10/08/2023 PCP: Clemens Curt, MD  Patient coming from: Home  Chief Complaint:  Chief Complaint  Patient presents with   GI Bleeding   HPI: Nicole Johnson is a 63 y.o. female with medical history significant of osteoarthritis, pancreatic cancer, GERD, prediabetes, jaundice, hyponatremia, B12 deficiency, allergic rhinitis who presented to the emergency department with complaints of bright red blood and blood clots per rectum.  She stated she had constipation after increasing her Creon enzymes.  He does not use aspirin or anticoagulation.  She notified her hematologist at [facility] who told her to continue monitoring her symptoms as they were mild.  However, yesterday she started having more profuse bleeding.  No melena.  She has not had significant abdominal pain, nausea or emesis.  He denied fever, chills, rhinorrhea, sore throat, wheezing or hemoptysis.  No chest pain, palpitations, diaphoresis, PND, orthopnea or pitting edema of the lower extremities.   No flank pain, dysuria, frequency or hematuria.  No polyuria, polydipsia, polyphagia or blurred vision.   Lab work: CBC showed a white count of 1.2 with 72% neutrophils, hemoglobin 7.0 g/dL and platelet 94.  PT was 14.4 and INR 1.1.  CMP showed a potassium 134 mmol/L, the rest of the electrolytes were normal after calcium correction.  Unremarkable renal function.  Total protein is 5.8 and albumin 2.7 g/dL.  Alkaline phosphatase 7072 units/L.  Normal transaminases and total bilirubin.  Fecal occult blood was positive.   ED course: Initial vital signs were temperature 97.5 F, pulse 81, respiration 20, BP 110/73 mmHg O2 sat 100% on room air.  The patient received pantoprazole 40 mg p.o. daily.  [facility] was consulted.  Review of Systems: As mentioned in the history of present illness. All other systems  reviewed and are negative.  Past Medical History:  Diagnosis Date   Arthritis    Cancer (HCC)    pancreatic   GERD (gastroesophageal reflux disease)    PONV (postoperative nausea and vomiting)    Prediabetes    Past Surgical History:  Procedure Laterality Date   BILIARY BRUSHING  12/17/2022   Procedure: BILIARY BRUSHING;  Surgeon: Alvis Jourdain, MD;  Location: [facility];  Service: Gastroenterology;;   BILIARY STENT PLACEMENT N/A 12/17/2022   Procedure: BILIARY STENT PLACEMENT;  Surgeon: Alvis Jourdain, MD;  Location: [facility];  Service: Gastroenterology;  Laterality: N/A;   BILIARY STENT PLACEMENT N/A 04/05/2023   Procedure: BILIARY STENT PLACEMENT;  Surgeon: Alvis Jourdain, MD;  Location: [facility];  Service: Gastroenterology;  Laterality: N/A;   ERCP N/A 12/17/2022   Procedure: ENDOSCOPIC RETROGRADE CHOLANGIOPANCREATOGRAPHY (ERCP);  Surgeon: Alvis Jourdain, MD;  Location: [facility];  Service: Gastroenterology;  Laterality: N/A;   ERCP N/A 04/05/2023   Procedure: ENDOSCOPIC RETROGRADE CHOLANGIOPANCREATOGRAPHY (ERCP);  Surgeon: Alvis Jourdain, MD;  Location: [facility];  Service: Gastroenterology;  Laterality: N/A;   ESOPHAGOGASTRODUODENOSCOPY (EGD) WITH PROPOFOL  N/A 12/18/2022   Procedure: ESOPHAGOGASTRODUODENOSCOPY (EGD) WITH PROPOFOL ;  Surgeon: Alvis Jourdain, MD;  Location: [facility];  Service: Gastroenterology;  Laterality: N/A;   ESOPHAGOGASTRODUODENOSCOPY (EGD) WITH PROPOFOL  N/A 12/30/2022   Procedure: ESOPHAGOGASTRODUODENOSCOPY (EGD) WITH PROPOFOL ;  Surgeon: Alvis Jourdain, MD;  Location: [facility] ENDOSCOPY;  Service: Gastroenterology;  Laterality: N/A;   EUS N/A 12/18/2022   Procedure: UPPER ENDOSCOPIC ULTRASOUND (EUS) LINEAR;  Surgeon: Alvis Jourdain, MD;  Location: [facility];  Service: Gastroenterology;  Laterality: N/A;   FINE NEEDLE  ASPIRATION N/A 12/18/2022   Procedure: FINE NEEDLE ASPIRATION (FNA) LINEAR;  Surgeon: Alvis Jourdain, MD;  Location: [facility];  Service:  Gastroenterology;  Laterality: N/A;   FINE NEEDLE ASPIRATION  12/30/2022   Procedure: FINE NEEDLE ASPIRATION (FNA) LINEAR;  Surgeon: Alvis Jourdain, MD;  Location: [facility]COPY;  Service: Gastroenterology;;   LAPAROSCOPIC ABDOMINAL EXPLORATION     SPHINCTEROTOMY  12/17/2022   Procedure: [address];  Surgeon: Alvis Jourdain, MD;  Location: [facility];  Service: Gastroenterology;;   [place] REMOVAL  04/05/2023   Procedure: STENT REMOVAL;  Surgeon: Alvis Jourdain, MD;  Location: [facility];  Service: Gastroenterology;;   UPPER ESOPHAGEAL ENDOSCOPIC ULTRASOUND (EUS) N/A 12/30/2022   Procedure: UPPER ESOPHAGEAL ENDOSCOPIC ULTRASOUND (EUS);  Surgeon: Alvis Jourdain, MD;  Location: [facility];  Service: Gastroenterology;  Laterality: N/A;   Social History:  reports that she has never smoked. She has never used smokeless tobacco. She reports that she does not currently use alcohol . She reports that she does not use drugs.  Allergies  Allergen Reactions   Other Nausea Only and Other (See Comments)    Certain types of Anesthesia cause SEVERE NAUSEA   Codeine Nausea Only   Hydrocodone -Acetaminophen  Other (See Comments)   Meloxicam Nausea Only   Tramadol Nausea Only    Family History  Problem Relation Age of Onset   Diabetes Mother    Stroke Mother    Lung cancer Father 74       smoked   Diabetes Father    Diabetes Sister    Breast cancer Sister 39   Diabetes Brother    Cancer Brother 60       neck cancer   Heart disease Maternal Grandmother    Breast cancer Maternal Grandmother        dx. <50, double mastectomy   Heart disease Maternal Grandfather     Prior to Admission medications   Medication Sig Start Date End Date Taking? Authorizing Provider  cetirizine (ZYRTEC) 10 MG chewable tablet Chew 10 mg by mouth daily. As needed    [provider]  CREON 24000-76000 units CPEP Take 1 capsule by mouth before each meal, and take 1 capsule before snacks. 02/25/23   [provider]  NON FORMULARY Apply 1 application  topically See admin instructions. Hot Cream  Muscle & Joint Cream/Capsaicin- based- Apply to painful or itchy areas as directed/as needed for relief    [provider]  omeprazole  (PRILOSEC) 20 MG capsule TAKE 1 CAPSULE(20 MG) BY MOUTH DAILY Patient taking differently: Take 20 mg by mouth 2 (two) times daily before a meal. 11/22/19   [name], FNP    Physical Exam: Vitals:   10/08/23 1119 10/08/23 1122  BP: 110/73   Pulse: 81   Resp: 20   Temp: (!) 97.5 F (36.4 C)   TempSrc: Oral   SpO2: 100%   Weight:  61.2 kg  Height:  5\' 5"  (1.651 m)   Physical Exam Vitals and nursing note reviewed.  Constitutional:      Appearance: She is normal weight. She is ill-appearing.  HENT:     Head: Normocephalic.     Nose: No rhinorrhea.     Mouth/Throat:     Mouth: Mucous membranes are dry.  Eyes:     General: No scleral icterus.    Pupils: Pupils are equal, round, and reactive to light.  Cardiovascular:     Rate and Rhythm: Normal rate and regular rhythm.  Pulmonary:     Effort: Pulmonary  effort is normal.     Breath sounds: Normal breath sounds. No wheezing, rhonchi or rales.  Abdominal:     General: Bowel sounds are normal. There is no distension.     Palpations: Abdomen is soft.     Tenderness: There is no abdominal tenderness. There is no guarding.  Musculoskeletal:     Cervical back: Neck supple.     Right lower leg: No edema.     Left lower leg: No edema.  Skin:    General: Skin is warm and dry.     Coloration: Skin is pale.  Neurological:     General: No focal deficit present.     Mental Status: She is alert and oriented to person, place, and time.  Psychiatric:        Mood and Affect: Mood normal.        Behavior: Behavior normal.     Data Reviewed:   Results are pending, will review when available.  Assessment and Plan: Principal Problem:   Symptomatic anemia In the setting of:   Acute lower  GI bleeding Admit to stepdown/inpatient. Clear liquid diet. Keep NPO midnight. Transfuse 1 unit of PRBC. Monitor H&H. Transfuse further as needed.. GI consult appreciated- -GI recommended CTA GI bleed.  Active Problems:   Hyponatremia Minimal. Almost normal level. Will follow sodium in the morning.    Pancytopenia (HCC) Monitor CBC. Following with hematology [facility].    Pancreatic cancer [facility]) Following with oncology at [facility].    Gastroesophageal reflux disease Continue pantoprazole 40 mg every 12 hours.    Prediabetes Glucose was normal. Check fasting glucose in AM.    Moderate protein malnutrition (HCC) In the setting of anemia and malignancy. May benefit from protein supplementation. Consider nutritional services evaluation. Follow-up albumin level.    Advance Care Planning:   Code Status: Full Code   Consults: [facility] Eugenia Hess, MD).  Family Communication: Her husband was at bedside.  Severity of Illness: The appropriate patient status for this patient is INPATIENT. Inpatient status is judged to be reasonable and necessary in order to provide the required intensity of service to ensure the patient's safety. The patient's presenting symptoms, physical exam findings, and initial radiographic and laboratory data in the context of their chronic comorbidities is felt to place them at high risk for further clinical deterioration. Furthermore, it is not anticipated that the patient will be medically stable for discharge from the hospital within 2 midnights of admission.   * I certify that at the point of admission it is my clinical judgment that the patient will require inpatient hospital care spanning beyond 2 midnights from the point of admission due to high intensity of service, high risk for further deterioration and high frequency of surveillance required.*  Author: Danice Dural, MD 10/08/2023 1:18 PM  For on call review [email or web address].   This document  was prepared using Dragon voice recognition software and may contain some unintended transcription errors.

## 2023-10-09 DIAGNOSIS — K922 Gastrointestinal hemorrhage, unspecified: Secondary | ICD-10-CM | POA: Diagnosis not present

## 2023-10-09 DIAGNOSIS — K766 Portal hypertension: Secondary | ICD-10-CM

## 2023-10-09 LAB — COMPREHENSIVE METABOLIC PANEL WITH GFR
ALT: 29 U/L (ref 0–44)
AST: 29 U/L (ref 15–41)
Albumin: 2.5 g/dL — ABNORMAL LOW (ref 3.5–5.0)
Alkaline Phosphatase: 169 U/L — ABNORMAL HIGH (ref 38–126)
Anion gap: 6 (ref 5–15)
BUN: 10 mg/dL (ref 8–23)
CO2: 25 mmol/L (ref 22–32)
Calcium: 8.2 mg/dL — ABNORMAL LOW (ref 8.9–10.3)
Chloride: 103 mmol/L (ref 98–111)
Creatinine, Ser: 0.75 mg/dL (ref 0.44–1.00)
GFR, Estimated: >60 mL/min (ref >60)
Glucose, Bld: 79 mg/dL (ref 70–99)
Potassium: 3.8 mmol/L (ref 3.5–5.1)
Sodium: 134 mmol/L — ABNORMAL LOW (ref 135–145)
Total Bilirubin: 0.8 mg/dL (ref 0.0–1.2)
Total Protein: 5.3 g/dL — ABNORMAL LOW (ref 6.5–8.1)

## 2023-10-09 LAB — CBC
HCT: 30.2 % — ABNORMAL LOW (ref 36.0–46.0)
Hemoglobin: 9.8 g/dL — ABNORMAL LOW (ref 12.0–15.0)
MCH: 30.8 pg (ref 26.0–34.0)
MCHC: 32.5 g/dL (ref 30.0–36.0)
MCV: 95 fL (ref 80.0–100.0)
Platelets: 102 10*3/uL — ABNORMAL LOW (ref 150–400)
RBC: 3.18 MIL/uL — ABNORMAL LOW (ref 3.87–5.11)
RDW: 14.9 % (ref 11.5–15.5)
WBC: 1.8 10*3/uL — ABNORMAL LOW (ref 4.0–10.5)
nRBC: 0 % (ref 0.0–0.2)

## 2023-10-09 LAB — HEMOGLOBIN AND HEMATOCRIT, BLOOD
HCT: 23.2 % — ABNORMAL LOW (ref 36.0–46.0)
Hemoglobin: 7.4 g/dL — ABNORMAL LOW (ref 12.0–15.0)

## 2023-10-09 LAB — PREPARE RBC (CROSSMATCH)

## 2023-10-09 MED ORDER — SODIUM CHLORIDE 0.9% IV SOLUTION: Freq: Once | INTRAVENOUS | Status: AC

## 2023-10-09 MED ORDER — ENSURE ENLIVE PO LIQD
237.0000 mL | Freq: Two times a day (BID) | ORAL | Status: DC
  Administered 2023-10-09 – 2023-10-10 (×2): 237 mL via ORAL

## 2023-10-09 NOTE — Progress Notes (Signed)
 Progress Note    ASSESSMENT AND PLAN:   Hematochezia- Neg CTA for active bleeding but + for portal colopathy vs colitis (favor former).  Much better. Hb 6.8 on dn s/p 2U 9.8.  Portal hypertension- ?etiology (likely XRT induced, no definite cirrhosis) with splenomegaly, venous collaterals, likely gastric varices, pancytopenia.  No ascites.  Inoperable pancreatic Adeno Ca s/p chemo/XRT. Ca 19-9 66  Perforated cholecystitis s/p cholecystostomy tube/biliary stenting Jan 2025. The liver lesion appears to be a biloma rather than abscess  Plan: - Advance diet to heart healthy salt restricted diet -Trend CBC. Keep Hb>8 - If continued bleeding, would consider IV octreotide - Continue Protonix - Would likely need EGD/colon this adm. Will defer it to Dr. Nickey Barn. He knows pt very well - If tolerates p.o., can resume Creon - Hold off on empiric antibiotics - Please do send stool studies for GI pathogens if further diarrhea - Dr. Nickey Barn taking over pt care tomorrow.  - D/W pt and husband     SUBJECTIVE   Feels better today. Had blood in the bowel movement this morning, but then minimal blood with the next BM. No abdominal pain.  No significant diarrhea Denies having any nausea/vomiting No fever or chills. Would like to avoid antibiotics since it makes her diarrhea significantly worse. Would like to eat      OBJECTIVE:     Vital signs in last 24 hours: Temp:  [97.6 F (36.4 C)-98.9 F (37.2 C)] 97.6 F (36.4 C) (05/18 0839) Pulse Rate:  [63-84] 78 (05/18 1100) Resp:  [0-26] 17 (05/18 1100) BP: (80-130)/(36-94) 118/62 (05/18 1100) SpO2:  [95 %-100 %] 99 % (05/18 1100) Weight:  [61.3 kg] 61.3 kg (05/17 1541) Last BM Date : 10/09/23 General:   Alert, thin built, in NAD EENT:  Normal hearing, non icteric sclera, conjunctive pink.  Heart:  Regular rate and rhythm; no murmur.  No lower extremity edema   Pulm: Normal respiratory effort, lungs CTA bilaterally without wheezes  or crackles. Abdomen:  Soft, nondistended, nontender.  Normal bowel sounds,.       Neurologic:  Alert and  oriented x4;  grossly normal neurologically. Psych:  Pleasant, cooperative.  Normal mood and affect.   Intake/Output from previous day: 05/17 0701 - 05/18 0700 In: 645.3 [I.V.:29.3; Blood:616] Out: -  Intake/Output this shift: No intake/output data recorded.  Lab Results: Recent Labs    10/08/23 1216 10/08/23 1227 10/08/23 2112 10/09/23 0130 10/09/23 0909  WBC 1.2*  --   --   --  1.8*  HGB 7.0*   < > 7.2* 7.4* 9.8*  HCT 22.9*   < > 22.8* 23.2* 30.2*  PLT 94*  --   --   --  102*   < > = values in this interval not displayed.   BMET Recent Labs    10/08/23 1216 10/08/23 1227 10/09/23 0129  NA 134* 137 134*  K 3.9 4.0 3.8  CL 102 101 103  CO2 25  --  25  GLUCOSE 83 78 79  BUN 10 8 10   CREATININE 0.60 0.90 0.75  CALCIUM 8.4*  --  8.2*   LFT Recent Labs    10/09/23 0129  PROT 5.3*  ALBUMIN 2.5*  AST 29  ALT 29  ALKPHOS 169*  BILITOT 0.8   PT/INR Recent Labs    10/08/23 1216  LABPROT 14.4  INR 1.1   Hepatitis Panel No results for input(s): "HEPBSAG", "HCVAB", "HEPAIGM", "HEPBIGM" in the last 72 hours.  CT ANGIO GI BLEED Result Date: 10/08/2023 CLINICAL DATA:  History of pancreatic cancer, right red blood per rectum for 1 week, diarrhea, cramping EXAM: CTA ABDOMEN AND PELVIS WITHOUT AND WITH CONTRAST TECHNIQUE: Multidetector CT imaging of the abdomen and pelvis was performed using the standard protocol during bolus administration of intravenous contrast. Multiplanar reconstructed images and MIPs were obtained and reviewed to evaluate the vascular anatomy. RADIATION DOSE REDUCTION: This exam was performed according to the departmental dose-optimization program which includes automated exposure control, adjustment of the mA and/or kV according to patient size and/or use of iterative reconstruction technique. CONTRAST:  OMNIPAQUE  IOHEXOL  350 MG/ML SOLN  COMPARISON:  04/02/2023 FINDINGS: VASCULAR Aorta: Normal caliber aorta without aneurysm, dissection, vasculitis or significant stenosis. Celiac: Patent without evidence of aneurysm, dissection, or vasculitis. There is acute angulation at the origin of the celiac artery, with luminal narrowing estimated 50%. SMA: Patent without evidence of aneurysm, dissection, vasculitis or significant stenosis. Renals: Both renal arteries are patent without evidence of aneurysm, dissection, vasculitis, fibromuscular dysplasia or significant stenosis. IMA: Patent without evidence of aneurysm, dissection, vasculitis or significant stenosis. Inflow: Patent without evidence of aneurysm, dissection, vasculitis or significant stenosis. Proximal Outflow: Bilateral common femoral and visualized portions of the superficial and profunda femoral arteries are patent without evidence of aneurysm, dissection, vasculitis or significant stenosis. Veins: The portal vein is patent, though there is severe extrinsic compression on the portal vein by the known hepatic mass. SMV and splenic vein are patent, with numerous venous collaterals throughout the mesentery and upper abdomen. No other significant venous findings. Review of the MIP images confirms the above findings. NON-VASCULAR Lower chest: No acute pleural or parenchymal lung disease. Hepatobiliary: Indwelling common bile duct stent, with pneumobilia consistent with stent patency. Gallbladder is decompressed, with nonspecific gallbladder wall thickening. Thick-walled cystic structure has developed within the right lobe liver adjacent to the gallbladder fossa, measuring 4.5 x 3.6 cm reference image 22/7. This does not appear contiguous with the gallbladder, and could represent hepatic abscess. The remainder of the liver is unremarkable. Pancreas: Ill-defined hypodense mass within the pancreatic head again noted, measuring approximately 3.5 x 2.5 cm reference image 28/7, previously 4.3 x 2.4 cm.  Progressive atrophy of the pancreatic body and tail. Spleen: Spleen is enlarged measuring 19.3 cm in craniocaudal length. No focal parenchymal abnormality. Adrenals/Urinary Tract: The adrenals are unremarkable. Kidneys enhance normally. Bladder is moderately distended without filling defect. Stomach/Bowel: No bowel obstruction or ileus. Gas fluid levels are seen throughout the colon consistent with given history of diarrhea. Wall thickening of the distal rectosigmoid colon consistent with inflammatory or infectious colitis. There is no evidence of intraluminal contrast accumulation to suggest active gastrointestinal hemorrhage. Lymphatic: No pathologic adenopathy. Reproductive: Uterus and bilateral adnexa are unremarkable. Other: There is a small amount of free fluid within the lower pelvis, nonspecific. No free intraperitoneal gas. No abdominal wall hernia. Musculoskeletal: No acute or destructive bony abnormalities. Reconstructed images demonstrate no additional findings. IMPRESSION: VASCULAR 1. No evidence of active gastrointestinal bleeding. 2.  Aortic Atherosclerosis (ICD10-I70.0). 3. 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 There is extrinsic compression on the portal vein, which demonstrates high-grade stenosis but remains patent. 4. Portal venous hypertension manifested by splenomegaly and venous collaterals within the upper abdomen. NON-VASCULAR 1. Distal colonic wall thickening compatible with inflammatory or infectious colitis. Gas fluid levels throughout the colon compatible with given history of diarrhea. 2. New thick walled cystic lesion within the right  lobe liver measuring up to 4.5 cm, separate from the decompressed gallbladder and concerning for intrahepatic abscess. Please correlate with clinical presentation. 3. Grossly stable ill-defined hypodense mass in the pancreatic head consistent with history of pancreatic malignancy. 4. Diffuse  gallbladder wall thickening, nonspecific given decompressed state. No evidence of calcified gallstones. 5. Small amount of pelvic free fluid, nonspecific. 6. Indwelling common bile duct stent, with pneumobilia compatible with stent patency. Electronically Signed   By: Bobbye Burrow M.D.   On: 10/08/2023 20:28     Principal Problem:   Acute lower GI bleeding Active Problems:   Gastroesophageal reflux disease   Prediabetes   Pancreatic cancer (HCC)   Hyponatremia   Moderate protein malnutrition (HCC)   Pancytopenia (HCC)   Symptomatic anemia     LOS: 1 day     Magnus Schuller, MD 10/09/2023, 12:09 PM [facility] [phone]

## 2023-10-09 NOTE — Progress Notes (Signed)
    Patient Name: KATLYNNE MCKERCHER           DOB: 12/27/1960  MRN: [id number]      Admission Date: 10/08/2023  Attending Provider: Danice Dural, MD  Primary Diagnosis: Acute lower GI bleeding   Level of care: Stepdown    CROSS COVER NOTE   Date of Service   10/09/2023   Eden Goodpasture, 63 y.o. female, was admitted on 10/08/2023 for Acute lower GI bleeding.    HPI/Events of Note   Acute Blood Loss Anemia r/t Lower GI Bleed Hematochezia Hemoglobin 6.8 -->  s/p 1 unit prbc --> 7.2 --> 7.4.  Baseline hgb 9-11  CT angiogram- No evidence of active gastrointestinal bleeding. Continues having bloody BM's. No acute changes reported.  Hemodynamically stable.   Interventions/ Plan   Blood transfusion, 1 unit PRBC Recheck H&H after transfusion is completed, transfuse if HGB <8        Bailey Lesser, DNP, ACNPC- AG [facility]

## 2023-10-09 NOTE — Progress Notes (Signed)
 PROGRESS NOTE  Nicole Johnson  AOZ:[id number] DOB: [birth year] DOA: 10/08/2023 PCP: Clemens Curt, MD  Consultants  Brief Narrative: 63 y.o. female with medical history significant of osteoarthritis, pancreatic cancer, GERD, prediabetes, jaundice, hyponatremia, B12 deficiency, allergic rhinitis who presented to the emergency department with complaints of bright red blood and blood clots per rectum.  She stated she had constipation after increasing her Creon enzymes.  He does not use aspirin or anticoagulation.  She notified her hematologist at [facility] who told her to continue monitoring her symptoms as they were mild.  However, day prior to admission she started having more profuse bleeding.  No melena.  She has not had significant abdominal pain, nausea or emesis.  Came to ED for the same.  Hemoglobin found to be 7.  Transfused and admitted to the hospitalist service   Assessment & Plan: Lower GI bleed and symptomatic anemia: - Much better today than yesterday. Vitals have stabilized and she feels well. - Still some blood when she wipes after bowel movements. - Blood pressures remain borderline low so we will keep in stepdown unit overnight x 1 more night. - Hemoglobin has come up to 9.2 and we will trend. -GI has seen, appreciate input.  Likely EGD/colonoscopy this administration.    Hyponatremia Minimal. Almost normal level. Will follow sodium in the morning.     Pancytopenia (HCC) Monitor CBC with differential to ensure no neutropenia - Did drop briefly to ANC of 900.  Trend differentials Following with hematology [facility].     Pancreatic cancer [place]) Following with oncology at [facility].     Gastroesophageal reflux disease Continue pantoprazole 40 mg every 12 hours.     Prediabetes Glucose was normal. Check fasting glucose in AM.     Moderate protein malnutrition (HCC) In the setting of anemia and malignancy. May benefit from protein supplementation. Consider nutritional services  evaluation. Follow-up albumin level.     DVT prophylaxis:  SCDs Start: 10/08/23 1334  Code Status:   Code Status: Full Code Family Communication: Discussed with husband at bedside Level of care: Stepdown Status is: Inpatient Dispo: Stepdown and likely able to transfer out tomorrow if she remains stable   Consults called: Gastroenterology  Subjective: Patient awake alert today.  Feels well.  Still some bleeding when she wipes but no gross blood in toilet.  Objective: Vitals:   10/09/23 1000 10/09/23 1100 10/09/23 1200 10/09/23 1238  BP: 110/64 118/62 112/62   Pulse: 73 78 77   Resp: 17 17 17    Temp:    98.3 F (36.8 C)  TempSrc:    Oral  SpO2: 96% 99% 99%   Weight:      Height:        Intake/Output Summary (Last 24 hours) at 10/09/2023 1432 Last data filed at 10/09/2023 0523 Gross per 24 hour  Intake 645.33 ml  Output --  Net 645.33 ml   Filed Weights   10/08/23 1122 10/08/23 1541  Weight: 61.2 kg 61.3 kg   Body mass index is 22.49 kg/m.  Gen: 63 y.o. female in no apparent distress.  Nontoxic Pulm: Non-labored breathing.  Clear to auscultation bilaterally.  CV: Regular rate and rhythm. No murmur, rub, or gallop. No JVD GI: Abdomen soft, non-tender, non-distended, with normoactive bowel sounds. No organomegaly or masses felt. Ext: Warm, no deformities, trace pedal edema Skin: No rashes, lesions no ulcers Neuro: Alert and oriented. No focal neurological deficits. Psych: Calm  Judgement and insight appear normal. Mood & affect appropriate.  I have personally reviewed the following labs and images: CBC: Recent Labs  Lab 10/08/23 1216 10/08/23 1227 10/08/23 2112 10/09/23 0130 10/09/23 0909  WBC 1.2*  --   --   --  1.8*  NEUTROABS 0.9*  --   --   --   --   HGB 7.0* 6.8* 7.2* 7.4* 9.8*  HCT 22.9* 20.0* 22.8* 23.2* 30.2*  MCV 99.1  --   --   --  95.0  PLT 94*  --   --   --  102*   BMP &GFR Recent Labs  Lab 10/08/23 1216 10/08/23 1227 10/09/23 0129   NA 134* 137 134*  K 3.9 4.0 3.8  CL 102 101 103  CO2 25  --  25  GLUCOSE 83 78 79  BUN 10 8 10   CREATININE 0.60 0.90 0.75  CALCIUM 8.4*  --  8.2*   Estimated Creatinine Clearance: 66.5 mL/min (by C-G formula based on SCr of 0.75 mg/dL). Liver & Pancreas: Recent Labs  Lab 10/08/23 1216 10/09/23 0129  AST 28 29  ALT 31 29  ALKPHOS 172* 169*  BILITOT 0.4 0.8  PROT 5.8* 5.3*  ALBUMIN 2.7* 2.5*   No results for input(s): "LIPASE", "AMYLASE" in the last 168 hours. No results for input(s): "AMMONIA" in the last 168 hours. Diabetic: No results for input(s): "HGBA1C" in the last 72 hours. No results for input(s): "GLUCAP" in the last 168 hours. Cardiac Enzymes: No results for input(s): "CKTOTAL", "CKMB", "CKMBINDEX", "TROPONINI" in the last 168 hours. Recent Labs    04/08/23 0928  PROBNP 93.0   Coagulation Profile: Recent Labs  Lab 10/08/23 1216  INR 1.1   Thyroid  Function Tests: No results for input(s): "TSH", "T4TOTAL", "FREET4", "T3FREE", "THYROIDAB" in the last 72 hours. Lipid Profile: No results for input(s): "CHOL", "HDL", "LDLCALC", "TRIG", "CHOLHDL", "LDLDIRECT" in the last 72 hours. Anemia Panel: No results for input(s): "VITAMINB12", "FOLATE", "FERRITIN", "TIBC", "IRON", "RETICCTPCT" in the last 72 hours. Urine analysis:    Component Value Date/Time   COLORURINE AMBER (A) 04/02/2023 1605   APPEARANCEUR HAZY (A) 04/02/2023 1605   LABSPEC 1.028 04/02/2023 1605   PHURINE 5.0 04/02/2023 1605   GLUCOSEU NEGATIVE 04/02/2023 1605   HGBUR NEGATIVE 04/02/2023 1605   BILIRUBINUR NEGATIVE 04/02/2023 1605   KETONESUR 20 (A) 04/02/2023 1605   PROTEINUR 30 (A) 04/02/2023 1605   NITRITE NEGATIVE 04/02/2023 1605   LEUKOCYTESUR TRACE (A) 04/02/2023 1605   Sepsis Labs: Invalid input(s): "PROCALCITONIN", "LACTICIDVEN"  Microbiology: Recent Results (from the past 240 hours)  MRSA Next Gen by PCR, Nasal     Status: None   Collection Time: 10/08/23  4:24 PM    Specimen: Nasal Mucosa; Nasal Swab  Result Value Ref Range Status   MRSA by PCR Next Gen NOT DETECTED NOT DETECTED Final    Comment: (NOTE) The GeneXpert MRSA Assay (FDA approved for NASAL specimens only), is one component of a comprehensive MRSA colonization surveillance program. It is not intended to diagnose MRSA infection nor to guide or monitor treatment for MRSA infections. Test performance is not FDA approved in patients less than 2 years old. Performed at [facility], [address] [place], [place] [address]     Radiology Studies: CT ANGIO GI BLEED Result Date: 10/08/2023 CLINICAL DATA:  History of pancreatic cancer, right red blood per rectum for 1 week, diarrhea, cramping EXAM: CTA ABDOMEN AND PELVIS WITHOUT AND WITH CONTRAST TECHNIQUE: Multidetector CT imaging of the abdomen and pelvis was performed using the  standard protocol during bolus administration of intravenous contrast. Multiplanar reconstructed images and MIPs were obtained and reviewed to evaluate the vascular anatomy. RADIATION DOSE REDUCTION: This exam was performed according to the departmental dose-optimization program which includes automated exposure control, adjustment of the mA and/or kV according to patient size and/or use of iterative reconstruction technique. CONTRAST:  OMNIPAQUE  IOHEXOL  350 MG/ML SOLN COMPARISON:  04/02/2023 FINDINGS: VASCULAR Aorta: Normal caliber aorta without aneurysm, dissection, vasculitis or significant stenosis. Celiac: Patent without evidence of aneurysm, dissection, or vasculitis. There is acute angulation at the origin of the celiac artery, with luminal narrowing estimated 50%. SMA: Patent without evidence of aneurysm, dissection, vasculitis or significant stenosis. Renals: Both renal arteries are patent without evidence of aneurysm, dissection, vasculitis, fibromuscular dysplasia or significant stenosis. IMA: Patent without evidence of aneurysm, dissection,  vasculitis or significant stenosis. Inflow: Patent without evidence of aneurysm, dissection, vasculitis or significant stenosis. Proximal Outflow: Bilateral common femoral and visualized portions of the superficial and profunda femoral arteries are patent without evidence of aneurysm, dissection, vasculitis or significant stenosis. Veins: The portal vein is patent, though there is severe extrinsic compression on the portal vein by the known hepatic mass. SMV and splenic vein are patent, with numerous venous collaterals throughout the mesentery and upper abdomen. No other significant venous findings. Review of the MIP images confirms the above findings. NON-VASCULAR Lower chest: No acute pleural or parenchymal lung disease. Hepatobiliary: Indwelling common bile duct stent, with pneumobilia consistent with stent patency. Gallbladder is decompressed, with nonspecific gallbladder wall thickening. Thick-walled cystic structure has developed within the right lobe liver adjacent to the gallbladder fossa, measuring 4.5 x 3.6 cm reference image 22/7. This does not appear contiguous with the gallbladder, and could represent hepatic abscess. The remainder of the liver is unremarkable. Pancreas: Ill-defined hypodense mass within the pancreatic head again noted, measuring approximately 3.5 x 2.5 cm reference image 28/7, previously 4.3 x 2.4 cm. Progressive atrophy of the pancreatic body and tail. Spleen: Spleen is enlarged measuring 19.3 cm in craniocaudal length. No focal parenchymal abnormality. Adrenals/Urinary Tract: The adrenals are unremarkable. Kidneys enhance normally. Bladder is moderately distended without filling defect. Stomach/Bowel: No bowel obstruction or ileus. Gas fluid levels are seen throughout the colon consistent with given history of diarrhea. Wall thickening of the distal rectosigmoid colon consistent with inflammatory or infectious colitis. There is no evidence of intraluminal contrast accumulation to  suggest active gastrointestinal hemorrhage. Lymphatic: No pathologic adenopathy. Reproductive: Uterus and bilateral adnexa are unremarkable. Other: There is a small amount of free fluid within the lower pelvis, nonspecific. No free intraperitoneal gas. No abdominal wall hernia. Musculoskeletal: No acute or destructive bony abnormalities. Reconstructed images demonstrate no additional findings. IMPRESSION: VASCULAR 1. No evidence of active gastrointestinal bleeding. 2.  Aortic Atherosclerosis (ICD10-I70.0). 3. 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 There is extrinsic compression on the portal vein, which demonstrates high-grade stenosis but remains patent. 4. Portal venous hypertension manifested by splenomegaly and venous collaterals within the upper abdomen. NON-VASCULAR 1. Distal colonic wall thickening compatible with inflammatory or infectious colitis. Gas fluid levels throughout the colon compatible with given history of diarrhea. 2. New thick walled cystic lesion within the right lobe liver measuring up to 4.5 cm, separate from the decompressed gallbladder and concerning for intrahepatic abscess. Please correlate with clinical presentation. 3. Grossly stable ill-defined hypodense mass in the pancreatic head consistent with history of pancreatic malignancy. 4. Diffuse gallbladder wall thickening, nonspecific given decompressed  state. No evidence of calcified gallstones. 5. Small amount of pelvic free fluid, nonspecific. 6. Indwelling common bile duct stent, with pneumobilia compatible with stent patency. Electronically Signed   By: Bobbye Burrow M.D.   On: 10/08/2023 20:28    Scheduled Meds:  Chlorhexidine  Gluconate Cloth  6 each Topical Daily   feeding supplement  1 Container Oral TID BM   pantoprazole (PROTONIX) IV  40 mg Intravenous Q12H   sodium chloride  flush  10-40 mL Intracatheter Q12H   Continuous Infusions:   LOS: 1 day   55 minutes  with more than 50% spent in reviewing records, counseling patient/family and coordinating care.  Trenton Frock, MD [facility] [email or web address] 10/09/2023, 2:32 PM

## 2023-10-09 NOTE — Progress Notes (Signed)
   10/09/23 0833  TOC Brief Assessment  Insurance and Status Reviewed  Patient has primary care physician Yes  Home environment has been reviewed From home, spouse  Prior level of function: Independent  Prior/Current Home Services No current home services  Social Drivers of Health Review SDOH reviewed no interventions necessary  Readmission risk has been reviewed Yes  Transition of care needs no transition of care needs at this time     Transition of Care Department [facility]) has reviewed patient and no TOC needs have been identified at this time. We will continue to monitor patient advancement through interdisciplinary progression rounds. If new patient transition needs arise, please place a TOC consult.

## 2023-10-09 NOTE — Plan of Care (Signed)
  Problem: Coping: Goal: Ability to adjust to condition or change in health will improve Outcome: Progressing   Problem: Fluid Volume: Goal: Ability to maintain a balanced intake and output will improve Outcome: Progressing   Problem: Health Behavior/Discharge Planning: Goal: Ability to identify and utilize available resources and services will improve Outcome: Progressing Goal: Ability to manage health-related needs will improve Outcome: Progressing

## 2023-10-10 DIAGNOSIS — K922 Gastrointestinal hemorrhage, unspecified: Secondary | ICD-10-CM | POA: Diagnosis not present

## 2023-10-10 LAB — BASIC METABOLIC PANEL WITH GFR
Anion gap: 9 (ref 5–15)
BUN: 15 mg/dL (ref 8–23)
CO2: 25 mmol/L (ref 22–32)
Calcium: 8.6 mg/dL — ABNORMAL LOW (ref 8.9–10.3)
Chloride: 101 mmol/L (ref 98–111)
Creatinine, Ser: 0.8 mg/dL (ref 0.44–1.00)
GFR, Estimated: >60 mL/min (ref >60)
Glucose, Bld: 113 mg/dL — ABNORMAL HIGH (ref 70–99)
Potassium: 4.1 mmol/L (ref 3.5–5.1)
Sodium: 135 mmol/L (ref 135–145)

## 2023-10-10 LAB — CBC WITH DIFFERENTIAL/PLATELET
Abs Immature Granulocytes: 0 10*3/uL (ref 0.00–0.07)
Basophils Absolute: 0 10*3/uL (ref 0.0–0.1)
Basophils Relative: 1 %
Eosinophils Absolute: 0 10*3/uL (ref 0.0–0.5)
Eosinophils Relative: 2 %
HCT: 28.6 % — ABNORMAL LOW (ref 36.0–46.0)
Hemoglobin: 9.1 g/dL — ABNORMAL LOW (ref 12.0–15.0)
Immature Granulocytes: 0 %
Lymphocytes Relative: 13 %
Lymphs Abs: 0.2 10*3/uL — ABNORMAL LOW (ref 0.7–4.0)
MCH: 30.1 pg (ref 26.0–34.0)
MCHC: 31.8 g/dL (ref 30.0–36.0)
MCV: 94.7 fL (ref 80.0–100.0)
Monocytes Absolute: 0.2 10*3/uL (ref 0.1–1.0)
Monocytes Relative: 10 %
Neutro Abs: 1.3 10*3/uL — ABNORMAL LOW (ref 1.7–7.7)
Neutrophils Relative %: 74 %
Platelets: 105 10*3/uL — ABNORMAL LOW (ref 150–400)
RBC: 3.02 MIL/uL — ABNORMAL LOW (ref 3.87–5.11)
RDW: 14.6 % (ref 11.5–15.5)
WBC: 1.7 10*3/uL — ABNORMAL LOW (ref 4.0–10.5)
nRBC: 0 % (ref 0.0–0.2)

## 2023-10-10 LAB — BPAM RBC
Blood Product Expiration Date: 202506122359
Blood Product Expiration Date: 202506112359
ISSUE DATE / TIME: 202505180217
ISSUE DATE / TIME: 202505171724
Unit Type and Rh: 5100
Unit Type and Rh: 5100

## 2023-10-10 LAB — TYPE AND SCREEN
ABO/RH(D): O POS
Antibody Screen: NEGATIVE
Unit division: 0
Unit division: 0

## 2023-10-10 MED ORDER — PEG 3350-KCL-NA BICARB-NACL 420 G PO SOLR
4000.0000 mL | Freq: Once | ORAL | Status: AC
  Administered 2023-10-10: 4000 mL via ORAL
  Filled 2023-10-10: qty 4000

## 2023-10-10 NOTE — Plan of Care (Signed)
   Problem: Education: Goal: Knowledge of General Education information will improve Description: Including pain rating scale, medication(s)/side effects and non-pharmacologic comfort measures Outcome: Progressing   Problem: Clinical Measurements: Goal: Diagnostic test results will improve Outcome: Progressing   Problem: Nutrition: Goal: Adequate nutrition will be maintained Outcome: Progressing

## 2023-10-10 NOTE — Progress Notes (Signed)
 Subjective: She intermittently has some mild hematochezia.  The patient denies any pain.  Objective: Vital signs in last 24 hours: Temp:  [97.6 F (36.4 C)-98.4 F (36.9 C)] 97.6 F (36.4 C) (05/19 1200) Pulse Rate:  [66-86] 71 (05/19 1500) Resp:  [10-25] 22 (05/19 1500) BP: (81-140)/(33-84) 100/60 (05/19 1400) SpO2:  [96 %-100 %] 98 % (05/19 1500) Last BM Date : 10/10/23  Intake/Output from previous day: 05/18 0701 - 05/19 0700 In: 357 [P.O.:337; I.V.:20] Out: 1200 [Urine:1200] Intake/Output this shift: Total I/O In: 240 [P.O.:240] Out: -   General appearance: alert and no distress GI: soft, non-tender; bowel sounds normal; no masses,  no organomegaly  Lab Results: Recent Labs    10/08/23 1216 10/08/23 1227 10/09/23 0130 10/09/23 0909 10/10/23 0314  WBC 1.2*  --   --  1.8* 1.7*  HGB 7.0*   < > 7.4* 9.8* 9.1*  HCT 22.9*   < > 23.2* 30.2* 28.6*  PLT 94*  --   --  102* 105*   < > = values in this interval not displayed.   BMET Recent Labs    10/08/23 1216 10/08/23 1227 10/09/23 0129 10/10/23 0314  NA 134* 137 134* 135  K 3.9 4.0 3.8 4.1  CL 102 101 103 101  CO2 25  --  25 25  GLUCOSE 83 78 79 113*  BUN 10 8 10 15   CREATININE 0.60 0.90 0.75 0.80  CALCIUM 8.4*  --  8.2* 8.6*   LFT Recent Labs    10/09/23 0129  PROT 5.3*  ALBUMIN 2.5*  AST 29  ALT 29  ALKPHOS 169*  BILITOT 0.8   PT/INR Recent Labs    10/08/23 1216  LABPROT 14.4  INR 1.1   Hepatitis Panel No results for input(s): "HEPBSAG", "HCVAB", "HEPAIGM", "HEPBIGM" in the last 72 hours. C-Diff No results for input(s): "CDIFFTOX" in the last 72 hours. Fecal Lactopherrin No results for input(s): "FECLLACTOFRN" in the last 72 hours.  Studies/Results: CT ANGIO GI BLEED Result Date: 10/08/2023 CLINICAL DATA:  History of pancreatic cancer, right red blood per rectum for 1 week, diarrhea, cramping EXAM: CTA ABDOMEN AND PELVIS WITHOUT AND WITH CONTRAST TECHNIQUE: Multidetector CT imaging of  the abdomen and pelvis was performed using the standard protocol during bolus administration of intravenous contrast. Multiplanar reconstructed images and MIPs were obtained and reviewed to evaluate the vascular anatomy. RADIATION DOSE REDUCTION: This exam was performed according to the departmental dose-optimization program which includes automated exposure control, adjustment of the mA and/or kV according to patient size and/or use of iterative reconstruction technique. CONTRAST:  OMNIPAQUE  IOHEXOL  350 MG/ML SOLN COMPARISON:  04/02/2023 FINDINGS: VASCULAR Aorta: Normal caliber aorta without aneurysm, dissection, vasculitis or significant stenosis. Celiac: Patent without evidence of aneurysm, dissection, or vasculitis. There is acute angulation at the origin of the celiac artery, with luminal narrowing estimated 50%. SMA: Patent without evidence of aneurysm, dissection, vasculitis or significant stenosis. Renals: Both renal arteries are patent without evidence of aneurysm, dissection, vasculitis, fibromuscular dysplasia or significant stenosis. IMA: Patent without evidence of aneurysm, dissection, vasculitis or significant stenosis. Inflow: Patent without evidence of aneurysm, dissection, vasculitis or significant stenosis. Proximal Outflow: Bilateral common femoral and visualized portions of the superficial and profunda femoral arteries are patent without evidence of aneurysm, dissection, vasculitis or significant stenosis. Veins: The portal vein is patent, though there is severe extrinsic compression on the portal vein by the known hepatic mass. SMV and splenic vein are patent, with numerous venous collaterals throughout the  mesentery and upper abdomen. No other significant venous findings. Review of the MIP images confirms the above findings. NON-VASCULAR Lower chest: No acute pleural or parenchymal lung disease. Hepatobiliary: Indwelling common bile duct stent, with pneumobilia consistent with stent  patency. Gallbladder is decompressed, with nonspecific gallbladder wall thickening. Thick-walled cystic structure has developed within the right lobe liver adjacent to the gallbladder fossa, measuring 4.5 x 3.6 cm reference image 22/7. This does not appear contiguous with the gallbladder, and could represent hepatic abscess. The remainder of the liver is unremarkable. Pancreas: Ill-defined hypodense mass within the pancreatic head again noted, measuring approximately 3.5 x 2.5 cm reference image 28/7, previously 4.3 x 2.4 cm. Progressive atrophy of the pancreatic body and tail. Spleen: Spleen is enlarged measuring 19.3 cm in craniocaudal length. No focal parenchymal abnormality. Adrenals/Urinary Tract: The adrenals are unremarkable. Kidneys enhance normally. Bladder is moderately distended without filling defect. Stomach/Bowel: No bowel obstruction or ileus. Gas fluid levels are seen throughout the colon consistent with given history of diarrhea. Wall thickening of the distal rectosigmoid colon consistent with inflammatory or infectious colitis. There is no evidence of intraluminal contrast accumulation to suggest active gastrointestinal hemorrhage. Lymphatic: No pathologic adenopathy. Reproductive: Uterus and bilateral adnexa are unremarkable. Other: There is a small amount of free fluid within the lower pelvis, nonspecific. No free intraperitoneal gas. No abdominal wall hernia. Musculoskeletal: No acute or destructive bony abnormalities. Reconstructed images demonstrate no additional findings. IMPRESSION: VASCULAR 1. No evidence of active gastrointestinal bleeding. 2.  Aortic Atherosclerosis (ICD10-I70.0). 3. 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 There is extrinsic compression on the portal vein, which demonstrates high-grade stenosis but remains patent. 4. Portal venous hypertension manifested by splenomegaly and venous collaterals within the upper abdomen.  NON-VASCULAR 1. Distal colonic wall thickening compatible with inflammatory or infectious colitis. Gas fluid levels throughout the colon compatible with given history of diarrhea. 2. New thick walled cystic lesion within the right lobe liver measuring up to 4.5 cm, separate from the decompressed gallbladder and concerning for intrahepatic abscess. Please correlate with clinical presentation. 3. Grossly stable ill-defined hypodense mass in the pancreatic head consistent with history of pancreatic malignancy. 4. Diffuse gallbladder wall thickening, nonspecific given decompressed state. No evidence of calcified gallstones. 5. Small amount of pelvic free fluid, nonspecific. 6. Indwelling common bile duct stent, with pneumobilia compatible with stent patency. Electronically Signed   By: Bobbye Burrow M.D.   On: 10/08/2023 20:28    Medications: Scheduled:  Chlorhexidine  Gluconate Cloth  6 each Topical Daily   feeding supplement  237 mL Oral BID BM   pantoprazole  (PROTONIX ) IV  40 mg Intravenous Q12H   polyethylene glycol-electrolytes  4,000 mL Oral Once   sodium chloride  flush  10-40 mL Intracatheter Q12H   Continuous:  Assessment/Plan: 1) Hematochezia. 2) Anemia. 3) Pancreatic cancer.   Portal HTN was noted.  It is possible for a pancreatic head cancer to cause a sinistral portal HTN, but the CT scan did show evidence of portal colopathy versus inflammation in the rectum.  Further evaluation with an EGD/colonoscopy will be performed tomorrow.  Plan: 1) EGD/colonoscopy tomorrow. 2) Follow HGB and transfuse if necessary.   LOS: 2 days   Mike Berntsen D 10/10/2023, 3:41 PM

## 2023-10-10 NOTE — H&P (View-Only) (Signed)
 Subjective: She intermittently has some mild hematochezia.  The patient denies any pain.  Objective: Vital signs in last 24 hours: Temp:  [97.6 F (36.4 C)-98.4 F (36.9 C)] 97.6 F (36.4 C) (05/19 1200) Pulse Rate:  [66-86] 71 (05/19 1500) Resp:  [10-25] 22 (05/19 1500) BP: (81-140)/(33-84) 100/60 (05/19 1400) SpO2:  [96 %-100 %] 98 % (05/19 1500) Last BM Date : 10/10/23  Intake/Output from previous day: 05/18 0701 - 05/19 0700 In: 357 [P.O.:337; I.V.:20] Out: 1200 [Urine:1200] Intake/Output this shift: Total I/O In: 240 [P.O.:240] Out: -   General appearance: alert and no distress GI: soft, non-tender; bowel sounds normal; no masses,  no organomegaly  Lab Results: Recent Labs    10/08/23 1216 10/08/23 1227 10/09/23 0130 10/09/23 0909 10/10/23 0314  WBC 1.2*  --   --  1.8* 1.7*  HGB 7.0*   < > 7.4* 9.8* 9.1*  HCT 22.9*   < > 23.2* 30.2* 28.6*  PLT 94*  --   --  102* 105*   < > = values in this interval not displayed.   BMET Recent Labs    10/08/23 1216 10/08/23 1227 10/09/23 0129 10/10/23 0314  NA 134* 137 134* 135  K 3.9 4.0 3.8 4.1  CL 102 101 103 101  CO2 25  --  25 25  GLUCOSE 83 78 79 113*  BUN 10 8 10 15   CREATININE 0.60 0.90 0.75 0.80  CALCIUM 8.4*  --  8.2* 8.6*   LFT Recent Labs    10/09/23 0129  PROT 5.3*  ALBUMIN 2.5*  AST 29  ALT 29  ALKPHOS 169*  BILITOT 0.8   PT/INR Recent Labs    10/08/23 1216  LABPROT 14.4  INR 1.1   Hepatitis Panel No results for input(s): "HEPBSAG", "HCVAB", "HEPAIGM", "HEPBIGM" in the last 72 hours. C-Diff No results for input(s): "CDIFFTOX" in the last 72 hours. Fecal Lactopherrin No results for input(s): "FECLLACTOFRN" in the last 72 hours.  Studies/Results: CT ANGIO GI BLEED Result Date: 10/08/2023 CLINICAL DATA:  History of pancreatic cancer, right red blood per rectum for 1 week, diarrhea, cramping EXAM: CTA ABDOMEN AND PELVIS WITHOUT AND WITH CONTRAST TECHNIQUE: Multidetector CT imaging of  the abdomen and pelvis was performed using the standard protocol during bolus administration of intravenous contrast. Multiplanar reconstructed images and MIPs were obtained and reviewed to evaluate the vascular anatomy. RADIATION DOSE REDUCTION: This exam was performed according to the departmental dose-optimization program which includes automated exposure control, adjustment of the mA and/or kV according to patient size and/or use of iterative reconstruction technique. CONTRAST:  OMNIPAQUE  IOHEXOL  350 MG/ML SOLN COMPARISON:  04/02/2023 FINDINGS: VASCULAR Aorta: Normal caliber aorta without aneurysm, dissection, vasculitis or significant stenosis. Celiac: Patent without evidence of aneurysm, dissection, or vasculitis. There is acute angulation at the origin of the celiac artery, with luminal narrowing estimated 50%. SMA: Patent without evidence of aneurysm, dissection, vasculitis or significant stenosis. Renals: Both renal arteries are patent without evidence of aneurysm, dissection, vasculitis, fibromuscular dysplasia or significant stenosis. IMA: Patent without evidence of aneurysm, dissection, vasculitis or significant stenosis. Inflow: Patent without evidence of aneurysm, dissection, vasculitis or significant stenosis. Proximal Outflow: Bilateral common femoral and visualized portions of the superficial and profunda femoral arteries are patent without evidence of aneurysm, dissection, vasculitis or significant stenosis. Veins: The portal vein is patent, though there is severe extrinsic compression on the portal vein by the known hepatic mass. SMV and splenic vein are patent, with numerous venous collaterals throughout the  mesentery and upper abdomen. No other significant venous findings. Review of the MIP images confirms the above findings. NON-VASCULAR Lower chest: No acute pleural or parenchymal lung disease. Hepatobiliary: Indwelling common bile duct stent, with pneumobilia consistent with stent  patency. Gallbladder is decompressed, with nonspecific gallbladder wall thickening. Thick-walled cystic structure has developed within the right lobe liver adjacent to the gallbladder fossa, measuring 4.5 x 3.6 cm reference image 22/7. This does not appear contiguous with the gallbladder, and could represent hepatic abscess. The remainder of the liver is unremarkable. Pancreas: Ill-defined hypodense mass within the pancreatic head again noted, measuring approximately 3.5 x 2.5 cm reference image 28/7, previously 4.3 x 2.4 cm. Progressive atrophy of the pancreatic body and tail. Spleen: Spleen is enlarged measuring 19.3 cm in craniocaudal length. No focal parenchymal abnormality. Adrenals/Urinary Tract: The adrenals are unremarkable. Kidneys enhance normally. Bladder is moderately distended without filling defect. Stomach/Bowel: No bowel obstruction or ileus. Gas fluid levels are seen throughout the colon consistent with given history of diarrhea. Wall thickening of the distal rectosigmoid colon consistent with inflammatory or infectious colitis. There is no evidence of intraluminal contrast accumulation to suggest active gastrointestinal hemorrhage. Lymphatic: No pathologic adenopathy. Reproductive: Uterus and bilateral adnexa are unremarkable. Other: There is a small amount of free fluid within the lower pelvis, nonspecific. No free intraperitoneal gas. No abdominal wall hernia. Musculoskeletal: No acute or destructive bony abnormalities. Reconstructed images demonstrate no additional findings. IMPRESSION: VASCULAR 1. No evidence of active gastrointestinal bleeding. 2.  Aortic Atherosclerosis (ICD10-I70.0). 3. 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 There is extrinsic compression on the portal vein, which demonstrates high-grade stenosis but remains patent. 4. Portal venous hypertension manifested by splenomegaly and venous collaterals within the upper abdomen.  NON-VASCULAR 1. Distal colonic wall thickening compatible with inflammatory or infectious colitis. Gas fluid levels throughout the colon compatible with given history of diarrhea. 2. New thick walled cystic lesion within the right lobe liver measuring up to 4.5 cm, separate from the decompressed gallbladder and concerning for intrahepatic abscess. Please correlate with clinical presentation. 3. Grossly stable ill-defined hypodense mass in the pancreatic head consistent with history of pancreatic malignancy. 4. Diffuse gallbladder wall thickening, nonspecific given decompressed state. No evidence of calcified gallstones. 5. Small amount of pelvic free fluid, nonspecific. 6. Indwelling common bile duct stent, with pneumobilia compatible with stent patency. Electronically Signed   By: Bobbye Burrow M.D.   On: 10/08/2023 20:28    Medications: Scheduled:  Chlorhexidine  Gluconate Cloth  6 each Topical Daily   feeding supplement  237 mL Oral BID BM   pantoprazole  (PROTONIX ) IV  40 mg Intravenous Q12H   polyethylene glycol-electrolytes  4,000 mL Oral Once   sodium chloride  flush  10-40 mL Intracatheter Q12H   Continuous:  Assessment/Plan: 1) Hematochezia. 2) Anemia. 3) Pancreatic cancer.   Portal HTN was noted.  It is possible for a pancreatic head cancer to cause a sinistral portal HTN, but the CT scan did show evidence of portal colopathy versus inflammation in the rectum.  Further evaluation with an EGD/colonoscopy will be performed tomorrow.  Plan: 1) EGD/colonoscopy tomorrow. 2) Follow HGB and transfuse if necessary.   LOS: 2 days   Mike Berntsen D 10/10/2023, 3:41 PM

## 2023-10-10 NOTE — Plan of Care (Signed)
  Problem: Education: Goal: Knowledge of General Education information will improve Description: Including pain rating scale, medication(s)/side effects and non-pharmacologic comfort measures Outcome: Progressing   Problem: Health Behavior/Discharge Planning: Goal: Ability to manage health-related needs will improve Outcome: Progressing   Problem: Clinical Measurements: Goal: Ability to maintain clinical measurements within normal limits will improve Outcome: Progressing Goal: Will remain free from infection Outcome: Progressing Goal: Diagnostic test results will improve Outcome: Progressing Goal: Respiratory complications will improve Outcome: Progressing Goal: Cardiovascular complication will be avoided Outcome: Progressing   Problem: Activity: Goal: Risk for activity intolerance will decrease Outcome: Progressing   Problem: Nutrition: Goal: Adequate nutrition will be maintained Outcome: Progressing   Problem: Coping: Goal: Level of anxiety will decrease Outcome: Progressing   Problem: Elimination: Goal: Will not experience complications related to bowel motility Outcome: Progressing Goal: Will not experience complications related to urinary retention Outcome: Progressing   Problem: Pain Managment: Goal: General experience of comfort will improve and/or be controlled Outcome: Progressing   Problem: Safety: Goal: Ability to remain free from injury will improve Outcome: Progressing   Problem: Skin Integrity: Goal: Risk for impaired skin integrity will decrease Outcome: Progressing   Cindy S. Bernetta Brilliant BSN, RN, CCRP, CCRN 10/10/2023 3:00 AM

## 2023-10-10 NOTE — Progress Notes (Signed)
 PROGRESS NOTE  Nicole Johnson  VHQ:[id number] DOB: Jul 01, 1960 DOA: 10/08/2023 PCP: Clemens Curt, MD  Consultants  Brief Narrative: 63 y.o. female with medical history significant of osteoarthritis, pancreatic cancer, GERD, prediabetes, jaundice, hyponatremia, B12 deficiency, allergic rhinitis who presented to the emergency department with complaints of bright red blood and blood clots per rectum.  She stated she had constipation after increasing her Creon enzymes.  He does not use aspirin or anticoagulation.  She notified her hematologist at [facility] who told her to continue monitoring her symptoms as they were mild.  However, day prior to admission she started having more profuse bleeding.  No melena.  She has not had significant abdominal pain, nausea or emesis.  Came to ED for the same.  Hemoglobin found to be 7.  Transfused and admitted to the hospitalist service   Assessment & Plan: Lower GI bleed and symptomatic anemia: - trend hgb -GI has seen, appreciate input.  Likely EGD/colonoscopy -on 5/20  Hypotension - Blood pressure appears to be at baseline around systolic blood pressure of 100    Hyponatremia -Resolved     Pancytopenia (HCC) -Trend Following with hematology [facility].     Pancreatic cancer [facility]) Following with oncology at [facility].     Gastroesophageal reflux disease Continue pantoprazole  40 mg every 12 hours.       Moderate protein malnutrition (HCC) In the setting of anemia and malignancy. May benefit from protein supplementation.     DVT prophylaxis:  SCDs Start: 10/08/23 1334  Code Status:   Code Status: Full Code Family Communication: Patient at bedside Level of care: Stepdown Status is: Inpatient    Consults called: Gastroenterology  Subjective: Says there is still blood when she goes to the bathroom  Objective: Vitals:   10/10/23 0600 10/10/23 0700 10/10/23 0800 10/10/23 0900  BP: 115/68 91/67    Pulse: 69 72 81 74  Resp: 12 12 18 11   Temp:   97.9  F (36.6 C)   TempSrc:   Oral   SpO2: 97% 98% 99% 99%  Weight:      Height:        Intake/Output Summary (Last 24 hours) at 10/10/2023 1115 Last data filed at 10/10/2023 0900 Gross per 24 hour  Intake 597 ml  Output 1200 ml  Net -603 ml   Filed Weights   10/08/23 1122 10/08/23 1541  Weight: 61.2 kg 61.3 kg   Body mass index is 22.49 kg/m.   General: Appearance:    Well developed, well nourished female in no acute distress     Lungs:      respirations unlabored  Heart:    Normal heart rate  MS:   All extremities are intact.   Neurologic:   Awake, alert, oriented x 3. No apparent focal neurological           defect.       I have personally reviewed the following labs and images: CBC: Recent Labs  Lab 10/08/23 1216 10/08/23 1227 10/08/23 2112 10/09/23 0130 10/09/23 0909 10/10/23 0314  WBC 1.2*  --   --   --  1.8* 1.7*  NEUTROABS 0.9*  --   --   --   --  1.3*  HGB 7.0* 6.8* 7.2* 7.4* 9.8* 9.1*  HCT 22.9* 20.0* 22.8* 23.2* 30.2* 28.6*  MCV 99.1  --   --   --  95.0 94.7  PLT 94*  --   --   --  102* 105*   BMP &GFR Recent  Labs  Lab 10/08/23 1216 10/08/23 1227 10/09/23 0129 10/10/23 0314  NA 134* 137 134* 135  K 3.9 4.0 3.8 4.1  CL 102 101 103 101  CO2 25  --  25 25  GLUCOSE 83 78 79 113*  BUN 10 8 10 15   CREATININE 0.60 0.90 0.75 0.80  CALCIUM 8.4*  --  8.2* 8.6*   Estimated Creatinine Clearance: 66.5 mL/min (by C-G formula based on SCr of 0.8 mg/dL). Liver & Pancreas: Recent Labs  Lab 10/08/23 1216 10/09/23 0129  AST 28 29  ALT 31 29  ALKPHOS 172* 169*  BILITOT 0.4 0.8  PROT 5.8* 5.3*  ALBUMIN 2.7* 2.5*   No results for input(s): "LIPASE", "AMYLASE" in the last 168 hours. No results for input(s): "AMMONIA" in the last 168 hours. Diabetic: No results for input(s): "HGBA1C" in the last 72 hours. No results for input(s): "GLUCAP" in the last 168 hours. Cardiac Enzymes: No results for input(s): "CKTOTAL", "CKMB", "CKMBINDEX", "TROPONINI" in the  last 168 hours. Recent Labs    04/08/23 0928  PROBNP 93.0   Coagulation Profile: Recent Labs  Lab 10/08/23 1216  INR 1.1   Thyroid  Function Tests: No results for input(s): "TSH", "T4TOTAL", "FREET4", "T3FREE", "THYROIDAB" in the last 72 hours. Lipid Profile: No results for input(s): "CHOL", "HDL", "LDLCALC", "TRIG", "CHOLHDL", "LDLDIRECT" in the last 72 hours. Anemia Panel: No results for input(s): "VITAMINB12", "FOLATE", "FERRITIN", "TIBC", "IRON", "RETICCTPCT" in the last 72 hours. Urine analysis:    Component Value Date/Time   COLORURINE AMBER (A) 04/02/2023 1605   APPEARANCEUR HAZY (A) 04/02/2023 1605   LABSPEC 1.028 04/02/2023 1605   PHURINE 5.0 04/02/2023 1605   GLUCOSEU NEGATIVE 04/02/2023 1605   HGBUR NEGATIVE 04/02/2023 1605   BILIRUBINUR NEGATIVE 04/02/2023 1605   KETONESUR 20 (A) 04/02/2023 1605   PROTEINUR 30 (A) 04/02/2023 1605   NITRITE NEGATIVE 04/02/2023 1605   LEUKOCYTESUR TRACE (A) 04/02/2023 1605   Sepsis Labs: Invalid input(s): "PROCALCITONIN", "LACTICIDVEN"  Microbiology: Recent Results (from the past 240 hours)  MRSA Next Gen by PCR, Nasal     Status: None   Collection Time: 10/08/23  4:24 PM   Specimen: Nasal Mucosa; Nasal Swab  Result Value Ref Range Status   MRSA by PCR Next Gen NOT DETECTED NOT DETECTED Final    Comment: (NOTE) The GeneXpert MRSA Assay (FDA approved for NASAL specimens only), is one component of a comprehensive MRSA colonization surveillance program. It is not intended to diagnose MRSA infection nor to guide or monitor treatment for MRSA infections. Test performance is not FDA approved in patients less than 31 years old. Performed at [facility], [address] [place], [place] [address]     Radiology Studies: No results found.   Scheduled Meds:  Chlorhexidine  Gluconate Cloth  6 each Topical Daily   feeding supplement  237 mL Oral BID BM   pantoprazole  (PROTONIX ) IV  40 mg Intravenous Q12H    sodium chloride  flush  10-40 mL Intracatheter Q12H   Continuous Infusions:   LOS: 2 days   55 minutes with more than 50% spent in reviewing records, counseling patient/family and coordinating care.  Enrigue Harvard, DO [facility] [email or web address] 10/10/2023, 11:15 AM

## 2023-10-11 ENCOUNTER — Inpatient Hospital Stay (HOSPITAL_COMMUNITY): Payer: Self-pay | Admitting: Certified Registered Nurse Anesthetist

## 2023-10-11 ENCOUNTER — Encounter (HOSPITAL_COMMUNITY)
Admission: EM | Disposition: A | Discharge status: Home / Self Care | Payer: Self-pay | Source: Home / Self Care | Attending: Internal Medicine

## 2023-10-11 DIAGNOSIS — K922 Gastrointestinal hemorrhage, unspecified: Secondary | ICD-10-CM | POA: Diagnosis not present

## 2023-10-11 DIAGNOSIS — E43 Unspecified severe protein-calorie malnutrition: Secondary | ICD-10-CM | POA: Insufficient documentation

## 2023-10-11 HISTORY — PX: COLONOSCOPY: SHX5424

## 2023-10-11 HISTORY — PX: ESOPHAGOGASTRODUODENOSCOPY: SHX5428

## 2023-10-11 LAB — CBC
HCT: 29.3 % — ABNORMAL LOW (ref 36.0–46.0)
Hemoglobin: 9.5 g/dL — ABNORMAL LOW (ref 12.0–15.0)
MCH: 31.1 pg (ref 26.0–34.0)
MCHC: 32.4 g/dL (ref 30.0–36.0)
MCV: 96.1 fL (ref 80.0–100.0)
Platelets: 93 10*3/uL — ABNORMAL LOW (ref 150–400)
RBC: 3.05 MIL/uL — ABNORMAL LOW (ref 3.87–5.11)
RDW: 14.2 % (ref 11.5–15.5)
WBC: 1.5 10*3/uL — ABNORMAL LOW (ref 4.0–10.5)
nRBC: 0 % (ref 0.0–0.2)

## 2023-10-11 LAB — BASIC METABOLIC PANEL WITH GFR
Anion gap: 5 (ref 5–15)
BUN: 8 mg/dL (ref 8–23)
CO2: 26 mmol/L (ref 22–32)
Calcium: 8.5 mg/dL — ABNORMAL LOW (ref 8.9–10.3)
Chloride: 105 mmol/L (ref 98–111)
Creatinine, Ser: 0.69 mg/dL (ref 0.44–1.00)
GFR, Estimated: >60 mL/min (ref >60)
Glucose, Bld: 95 mg/dL (ref 70–99)
Potassium: 4 mmol/L (ref 3.5–5.1)
Sodium: 136 mmol/L (ref 135–145)

## 2023-10-11 SURGERY — EGD (ESOPHAGOGASTRODUODENOSCOPY)
Anesthesia: Monitor Anesthesia Care

## 2023-10-11 MED ORDER — KATE FARMS STANDARD 1.4 PO LIQD
325.0000 mL | Freq: Two times a day (BID) | ORAL | Status: DC
  Administered 2023-10-12: 325 mL via ORAL
  Filled 2023-10-11 (×2): qty 325

## 2023-10-11 MED ORDER — PROPOFOL 500 MG/50ML IV EMUL
INTRAVENOUS | Status: DC | PRN
  Administered 2023-10-11: 200 ug/kg/min via INTRAVENOUS

## 2023-10-11 MED ORDER — SODIUM CHLORIDE 0.9 % IV SOLN: INTRAVENOUS | Status: DC | PRN

## 2023-10-11 MED ORDER — STERILE WATER FOR INJECTION IJ SOLN
INTRAMUSCULAR | Status: AC
  Administered 2023-10-11: 10 mL
  Filled 2023-10-11: qty 10

## 2023-10-11 MED ORDER — PHENYLEPHRINE 80 MCG/ML (10ML) SYRINGE FOR IV PUSH (FOR BLOOD PRESSURE SUPPORT)
PREFILLED_SYRINGE | INTRAVENOUS | Status: DC | PRN
  Administered 2023-10-11: 80 ug via INTRAVENOUS
  Administered 2023-10-11: 120 ug via INTRAVENOUS

## 2023-10-11 NOTE — Transfer of Care (Signed)
 Immediate Anesthesia Transfer of Care Note  Patient: Nicole Johnson  Procedure(s) Performed: EGD (ESOPHAGOGASTRODUODENOSCOPY) COLONOSCOPY  Patient Location: PACU  Anesthesia Type:MAC  Level of Consciousness: drowsy  Airway & Oxygen Therapy: Patient Spontanous Breathing and Patient connected to face mask  Post-op Assessment: Report given to RN and Post -op Vital signs reviewed and stable  Post vital signs: Reviewed and stable  Last Vitals:  Vitals Value Taken Time  BP    Temp    Pulse    Resp    SpO2      Last Pain:  Vitals:   10/11/23 1357  TempSrc: Temporal  PainSc: 0-No pain      Patients Stated Pain Goal: 0 (10/09/23 0400)  Complications: No notable events documented.

## 2023-10-11 NOTE — Anesthesia Procedure Notes (Signed)
 Procedure Name: MAC Date/Time: 10/11/2023 2:21 PM  Performed by: Raylene Calamity, CRNAPre-anesthesia Checklist: Patient identified, Timeout performed, Emergency Drugs available, Suction available and Patient being monitored Patient Re-evaluated:Patient Re-evaluated prior to induction Oxygen Delivery Method: Simple face mask

## 2023-10-11 NOTE — Progress Notes (Addendum)
 PROGRESS NOTE  Nicole Johnson  ZOX:[id number] DOB: 1961-04-09 DOA: 10/08/2023 PCP: Clemens Curt, MD    Brief Narrative: 63 y.o. female with medical history significant of osteoarthritis, pancreatic cancer, GERD, prediabetes, jaundice, hyponatremia, B12 deficiency, allergic rhinitis who presented to the emergency department with complaints of bright red blood and blood clots per rectum.  She stated she had constipation after increasing her Creon enzymes.  He does not use aspirin or anticoagulation.  She notified her hematologist at [facility] who told her to continue monitoring her symptoms as they were mild.  However, day prior to admission she started having more profuse bleeding.  No melena.  She has not had significant abdominal pain, nausea or emesis.  Came to ED for the same.  Hemoglobin found to be 7.  Transfused and admitted to the hospitalist service.  Plan for colonoscopy and EGD 5/20   Assessment & Plan: Lower GI bleed and symptomatic anemia: -hgb stable -GI has seen, appreciate input.  EGD/colonoscopy -on 5/20 Addendum: Per GI no sign of active bleeding on EGD or colonoscopy.  EGD did show some portal hypertensive gastropathy.  Specifically left-sided portal hypertension.  They suspect that she may have had a rapid upper GI bleed without heated hemodynamic compromise.  Colonoscopy was unremarkable.  Will do a trial of a low-dose beta-blocker such as propranolol.  Hypotension - Blood pressure appears to be at baseline around systolic blood pressure of 100    Hyponatremia -Resolved     Pancytopenia (HCC) -Trend Following with hematology [facility].     Pancreatic cancer [facility]) Following with oncology at [facility].     Gastroesophageal reflux disease Continue pantoprazole       Moderate protein malnutrition (HCC) In the setting of anemia and malignancy. May benefit from protein supplementation.   Can transfer out of stepdown unit in anticipation of discharge in the morning Labs ordered for  a.m.   DVT prophylaxis:  SCDs Start: 10/08/23 1334  Code Status:   Code Status: Full Code Family Communication: Patient at bedside Level of care: Stepdown Status is: Inpatient    Consults called: Gastroenterology   Subjective: Says she is well cleaned out for her procedures today  Objective: Vitals:   10/11/23 0400 10/11/23 0500 10/11/23 0600 10/11/23 0830  BP: 108/64 (!) 95/56 (!) 110/56   Pulse: (!) 58 (!) 58 65   Resp: 14 10 10    Temp:    98 F (36.7 C)  TempSrc:    Oral  SpO2: 100% 96% 96%   Weight:      Height:        Intake/Output Summary (Last 24 hours) at 10/11/2023 0834 Last data filed at 10/11/2023 0015 Gross per 24 hour  Intake 4740 ml  Output --  Net 4740 ml   Filed Weights   10/08/23 1122 10/08/23 1541  Weight: 61.2 kg 61.3 kg   Body mass index is 22.49 kg/m.   General: Appearance:    Well developed, well nourished female in no acute distress     Lungs:      respirations unlabored  Heart:    Normal heart rate  MS:   All extremities are intact.   Neurologic:   Awake, alert, oriented x 3. No apparent focal neurological           defect.       I have personally reviewed the following labs and images: CBC: Recent Labs  Lab 10/08/23 1216 10/08/23 1227 10/08/23 2112 10/09/23 0130 10/09/23 8119 10/10/23 1478 10/11/23 2956  WBC 1.2*  --   --   --  1.8* 1.7* 1.5*  NEUTROABS 0.9*  --   --   --   --  1.3*  --   HGB 7.0*   < > 7.2* 7.4* 9.8* 9.1* 9.5*  HCT 22.9*   < > 22.8* 23.2* 30.2* 28.6* 29.3*  MCV 99.1  --   --   --  95.0 94.7 96.1  PLT 94*  --   --   --  102* 105* 93*   < > = values in this interval not displayed.   BMP &GFR Recent Labs  Lab 10/08/23 1216 10/08/23 1227 10/09/23 0129 10/10/23 0314 10/11/23 0529  NA 134* 137 134* 135 136  K 3.9 4.0 3.8 4.1 4.0  CL 102 101 103 101 105  CO2 25  --  25 25 26   GLUCOSE 83 78 79 113* 95  BUN 10 8 10 15 8   CREATININE 0.60 0.90 0.75 0.80 0.69  CALCIUM 8.4*  --  8.2* 8.6* 8.5*    Estimated Creatinine Clearance: 66.5 mL/min (by C-G formula based on SCr of 0.69 mg/dL). Liver & Pancreas: Recent Labs  Lab 10/08/23 1216 10/09/23 0129  AST 28 29  ALT 31 29  ALKPHOS 172* 169*  BILITOT 0.4 0.8  PROT 5.8* 5.3*  ALBUMIN 2.7* 2.5*   No results for input(s): "LIPASE", "AMYLASE" in the last 168 hours. No results for input(s): "AMMONIA" in the last 168 hours. Diabetic: No results for input(s): "HGBA1C" in the last 72 hours. No results for input(s): "GLUCAP" in the last 168 hours. Cardiac Enzymes: No results for input(s): "CKTOTAL", "CKMB", "CKMBINDEX", "TROPONINI" in the last 168 hours. Recent Labs    04/08/23 0928  PROBNP 93.0   Coagulation Profile: Recent Labs  Lab 10/08/23 1216  INR 1.1   Thyroid  Function Tests: No results for input(s): "TSH", "T4TOTAL", "FREET4", "T3FREE", "THYROIDAB" in the last 72 hours. Lipid Profile: No results for input(s): "CHOL", "HDL", "LDLCALC", "TRIG", "CHOLHDL", "LDLDIRECT" in the last 72 hours. Anemia Panel: No results for input(s): "VITAMINB12", "FOLATE", "FERRITIN", "TIBC", "IRON", "RETICCTPCT" in the last 72 hours. Urine analysis:    Component Value Date/Time   COLORURINE AMBER (A) 04/02/2023 1605   APPEARANCEUR HAZY (A) 04/02/2023 1605   LABSPEC 1.028 04/02/2023 1605   PHURINE 5.0 04/02/2023 1605   GLUCOSEU NEGATIVE 04/02/2023 1605   HGBUR NEGATIVE 04/02/2023 1605   BILIRUBINUR NEGATIVE 04/02/2023 1605   KETONESUR 20 (A) 04/02/2023 1605   PROTEINUR 30 (A) 04/02/2023 1605   NITRITE NEGATIVE 04/02/2023 1605   LEUKOCYTESUR TRACE (A) 04/02/2023 1605   Sepsis Labs: Invalid input(s): "PROCALCITONIN", "LACTICIDVEN"  Microbiology: Recent Results (from the past 240 hours)  MRSA Next Gen by PCR, Nasal     Status: None   Collection Time: 10/08/23  4:24 PM   Specimen: Nasal Mucosa; Nasal Swab  Result Value Ref Range Status   MRSA by PCR Next Gen NOT DETECTED NOT DETECTED Final    Comment: (NOTE) The GeneXpert MRSA  Assay (FDA approved for NASAL specimens only), is one component of a comprehensive MRSA colonization surveillance program. It is not intended to diagnose MRSA infection nor to guide or monitor treatment for MRSA infections. Test performance is not FDA approved in patients less than 35 years old. Performed at [facility], [address] [place], [place] [address]     Radiology Studies: No results found.   Scheduled Meds:  Chlorhexidine  Gluconate Cloth  6 each Topical Daily   feeding supplement  237 mL Oral BID BM   pantoprazole  (PROTONIX ) IV  40 mg Intravenous Q12H   sodium chloride  flush  10-40 mL Intracatheter Q12H   Continuous Infusions:   LOS: 3 days   55 minutes with more than 50% spent in reviewing records, counseling patient/family and coordinating care.  Enrigue Harvard, DO [facility] [email or web address] 10/11/2023, 8:34 AM

## 2023-10-11 NOTE — Plan of Care (Signed)
  Problem: Education: Goal: Knowledge of General Education information will improve Description: Including pain rating scale, medication(s)/side effects and non-pharmacologic comfort measures 10/11/2023 0355 by Jarrell Merritts, RN Outcome: Progressing 10/11/2023 0347 by Jarrell Merritts, RN Outcome: Progressing   Problem: Health Behavior/Discharge Planning: Goal: Ability to manage health-related needs will improve 10/11/2023 0355 by Jarrell Merritts, RN Outcome: Progressing 10/11/2023 0347 by Jarrell Merritts, RN Outcome: Progressing   Problem: Clinical Measurements: Goal: Ability to maintain clinical measurements within normal limits will improve 10/11/2023 0355 by Jarrell Merritts, RN Outcome: Progressing 10/11/2023 0347 by Jarrell Merritts, RN Outcome: Progressing Goal: Will remain free from infection 10/11/2023 0355 by Jarrell Merritts, RN Outcome: Progressing 10/11/2023 0347 by Jarrell Merritts, RN Outcome: Progressing Goal: Diagnostic test results will improve 10/11/2023 0355 by Jarrell Merritts, RN Outcome: Progressing 10/11/2023 0347 by Jarrell Merritts, RN Outcome: Progressing Goal: Respiratory complications will improve 10/11/2023 0355 by Jarrell Merritts, RN Outcome: Progressing 10/11/2023 0347 by Jarrell Merritts, RN Outcome: Progressing Goal: Cardiovascular complication will be avoided 10/11/2023 0355 by Jarrell Merritts, RN Outcome: Progressing 10/11/2023 0347 by Jarrell Merritts, RN Outcome: Progressing   Problem: Activity: Goal: Risk for activity intolerance will decrease 10/11/2023 0355 by Jarrell Merritts, RN Outcome: Progressing 10/11/2023 0347 by Jarrell Merritts, RN Outcome: Progressing   Problem: Nutrition: Goal: Adequate nutrition will be maintained 10/11/2023 0355 by Jarrell Merritts, RN Outcome: Progressing 10/11/2023 0347 by Jarrell Merritts, RN Outcome: Progressing   Problem: Coping: Goal: Level of anxiety will decrease 10/11/2023 0355 by Jarrell Merritts, RN Outcome:  Progressing 10/11/2023 0347 by Jarrell Merritts, RN Outcome: Progressing   Problem: Elimination: Goal: Will not experience complications related to bowel motility 10/11/2023 0355 by Jarrell Merritts, RN Outcome: Progressing 10/11/2023 0347 by Jarrell Merritts, RN Outcome: Progressing Goal: Will not experience complications related to urinary retention 10/11/2023 0355 by Jarrell Merritts, RN Outcome: Progressing 10/11/2023 0347 by Jarrell Merritts, RN Outcome: Progressing   Problem: Pain Managment: Goal: General experience of comfort will improve and/or be controlled 10/11/2023 0355 by Jarrell Merritts, RN Outcome: Progressing 10/11/2023 0347 by Jarrell Merritts, RN Outcome: Progressing   Problem: Safety: Goal: Ability to remain free from injury will improve 10/11/2023 0355 by Jarrell Merritts, RN Outcome: Progressing 10/11/2023 0347 by Jarrell Merritts, RN Outcome: Progressing   Problem: Skin Integrity: Goal: Risk for impaired skin integrity will decrease 10/11/2023 0355 by Jarrell Merritts, RN Outcome: Progressing 10/11/2023 0347 by Jarrell Merritts, RN Outcome: Progressing   Cindy S. Bernetta Brilliant BSN, RN, [employer], CCRN 10/11/2023 3:57 AM

## 2023-10-11 NOTE — Interval H&P Note (Signed)
 History and Physical Interval Note:  10/11/2023 2:07 PM  Nicole Johnson  has presented today for surgery, with the diagnosis of GI bleed.  The various methods of treatment have been discussed with the patient and family. After consideration of risks, benefits and other options for treatment, the patient has consented to  Procedure(s): EGD (ESOPHAGOGASTRODUODENOSCOPY) (N/A) COLONOSCOPY (N/A) as a surgical intervention.  The patient's history has been reviewed, patient examined, no change in status, stable for surgery.  I have reviewed the patient's chart and labs.  Questions were answered to the patient's satisfaction.     Kalisha Keadle D

## 2023-10-11 NOTE — Op Note (Addendum)
 [facility] Patient Name: Nicole Johnson Procedure Date: 10/11/2023 MRN: [id number] Attending MD: Alvis Jourdain , MD, [id number] Date of Birth: Jan 31, 1961 CSN: [id number] Age: 63 Admit Type: Inpatient Procedure:                Upper GI endoscopy Indications:              Iron deficiency anemia Providers:                Alvis Jourdain, MD, Golda Latch, RN, Rinda Cheers,                            Technician Referring MD:              Medicines:                Propofol  per Anesthesia Complications:            No immediate complications. Estimated Blood Loss:     Estimated blood loss: none. Procedure:                Pre-Anesthesia Assessment:                           - Prior to the procedure, a History and Physical                            was performed, and patient medications and                            allergies were reviewed. The patient's tolerance of                            previous anesthesia was also reviewed. The risks                            and benefits of the procedure and the sedation                            options and risks were discussed with the patient.                            All questions were answered, and informed consent                            was obtained. Prior Anticoagulants: The patient has                            taken no anticoagulant or antiplatelet agents. ASA                            Grade Assessment: III - A patient with severe                            systemic disease. After reviewing the risks and  benefits, the patient was deemed in satisfactory                            condition to undergo the procedure.                           - Sedation was administered by an anesthesia                            professional. Deep sedation was attained.                           After obtaining informed consent, the endoscope was                            passed under direct vision. Throughout  the                            procedure, the patient's blood pressure, pulse, and                            oxygen saturations were monitored continuously. The                            PCF-HQ190L ([id number] Olympus colonoscope was                            introduced through the mouth, and advanced to the                            second part of duodenum. The upper GI endoscopy was                            accomplished without difficulty. The patient                            tolerated the procedure well. Scope In: Scope Out: Findings:      The examined esophagus was normal.      Diffuse moderately erythematous mucosa without bleeding was found in the       entire examined stomach.      Diffuse moderately erythematous mucosa with active bleeding and with       stigmata of bleeding was found in the duodenal bulb and in the second       portion of the duodenum.      There was no evidence of esophageal varices. In the gastric lumen a       diffuse erythema was identified. It was most severe in the antrum and       pylorus region. No spontaneous bleeding was identified, however, the       mucosa was friable. In the duodenum, the lumen was edematous and there       was evidence of fresh blood, however, the site of bleeding was not       localized. No further bleeding was noted, but this region was also       friable. The metallic stent was  noted to be intact in the second portion       of the duodenum. The findings in the gastric and duodenal mucosa can be       consistent with portal HTN gastropahy, in this instance, sinistral       portal HTN. She was noted to have severe extrinsic portal vein stenosis       from the pancreatic head mass. Collaterals and splenomegaly were       identified. There was no evidence of IGV 1 or IGV 2. Impression:               - Normal esophagus.                           - Erythematous mucosa in the stomach.                           - Erythematous  duodenopathy.                           - No specimens collected. Moderate Sedation:      Not Applicable - Patient had care per Anesthesia. Recommendation:           - Return patient to hospital ward for ongoing care.                           - Resume regular diet.                           - Continue present medications.                           - ? Trial of nadolol 20 mg QD for treatment of                            portal HTN.                           - Follow up in the office in 2 weeks. Procedure Code(s):        --- Professional ---                           [phone], Esophagogastroduodenoscopy, flexible,                            transoral; diagnostic, including collection of                            specimen(s) by brushing or washing, when performed                            (separate procedure) Diagnosis Code(s):        --- Professional ---                           K31.89, Other diseases of stomach and duodenum  D50.9, Iron deficiency anemia, unspecified CPT copyright 2022 American Medical Association. All rights reserved. The codes documented in this report are preliminary and upon coder review may  be revised to meet current compliance requirements. Alvis Jourdain, MD Alvis Jourdain, MD 10/11/2023 3:09:51 PM This report has been signed electronically. Number of Addenda: 0

## 2023-10-11 NOTE — Op Note (Addendum)
 [facility] Patient Name: Nicole Johnson Procedure Date: 10/11/2023 MRN: [id number] Attending MD: Alvis Jourdain , MD, [id number] Date of Birth: Jan 10, 1961 CSN: [id number] Age: 63 Admit Type: Inpatient Procedure:                Colonoscopy Indications:              Hematochezia Providers:                Alvis Jourdain, MD, Golda Latch, RN, Rinda Cheers,                            Technician Referring MD:              Medicines:                Propofol  per Anesthesia Complications:            No immediate complications. Estimated Blood Loss:     Estimated blood loss: none. Procedure:                Pre-Anesthesia Assessment:                           - Prior to the procedure, a History and Physical                            was performed, and patient medications and                            allergies were reviewed. The patient's tolerance of                            previous anesthesia was also reviewed. The risks                            and benefits of the procedure and the sedation                            options and risks were discussed with the patient.                            All questions were answered, and informed consent                            was obtained. Prior Anticoagulants: The patient has                            taken no anticoagulant or antiplatelet agents. ASA                            Grade Assessment: III - A patient with severe                            systemic disease. After reviewing the risks and                            benefits, the patient  was deemed in satisfactory                            condition to undergo the procedure.                           - Sedation was administered by an anesthesia                            professional. Deep sedation was attained.                           After obtaining informed consent, the colonoscope                            was passed under direct vision. Throughout the                             procedure, the patient's blood pressure, pulse, and                            oxygen saturations were monitored continuously. The                            PCF-HQ190L ([id number]) Olympus colonoscope was                            introduced through the anus and advanced to the the                            cecum, identified by appendiceal orifice and                            ileocecal valve. The colonoscopy was somewhat                            difficult due to significant looping. Successful                            completion of the procedure was aided by using                            manual pressure and straightening and shortening                            the scope to obtain bowel loop reduction. The                            patient tolerated the procedure well. The quality                            of the bowel preparation was evaluated using the  BBPS [facility] Bowel Preparation Scale) with scores                            of: Right Colon = 2 (minor amount of residual                            staining, small fragments of stool and/or opaque                            liquid, but mucosa seen well), Transverse Colon = 2                            (minor amount of residual staining, small fragments                            of stool and/or opaque liquid, but mucosa seen                            well) and Left Colon = 2 (minor amount of residual                            staining, small fragments of stool and/or opaque                            liquid, but mucosa seen well). The total BBPS score                            equals 6. The quality of the bowel preparation was                            good. The ileocecal valve, appendiceal orifice, and                            rectum were photographed. Scope In: 2:34:30 PM Scope Out: 2:52:42 PM Scope Withdrawal Time: 0 hours 9 minutes 40 seconds  Total Procedure Duration: 0  hours 18 minutes 12 seconds  Findings:      The colon (entire examined portion) appeared normal. A careful       examination of the colon was performed and there was no evidence of any       blood, however, the mucosa was noted to be friable without overt       evidence of inflammation. In the rectum the insufflation was decreased       and there was no evidence of any rectal varices. Inflammation was not       noted in this area to correlate with the CTA. No source of bleeding was       noted in the lower GI tract. Impression:               - The entire examined colon is normal.                           - No specimens collected. Moderate Sedation:      Not Applicable - Patient had care  per Anesthesia. Recommendation:           - Return patient to hospital ward for ongoing care.                           - Resume regular diet.                           - Continue present medications.                           - Repeat colonoscopy is not recommended for                            surveillance. Procedure Code(s):        --- Professional ---                           [phone], Colonoscopy, flexible; diagnostic, including                            collection of specimen(s) by brushing or washing,                            when performed (separate procedure) Diagnosis Code(s):        --- Professional ---                           K92.1, Melena (includes Hematochezia) CPT copyright 2022 American Medical Association. All rights reserved. The codes documented in this report are preliminary and upon coder review may  be revised to meet current compliance requirements. Alvis Jourdain, MD Alvis Jourdain, MD 10/11/2023 3:15:41 PM This report has been signed electronically. Number of Addenda: 0

## 2023-10-11 NOTE — Progress Notes (Signed)
 Initial Nutrition Assessment  DOCUMENTATION CODES:   Severe malnutrition in context of chronic illness  INTERVENTION:  - Diet per MD/GI after colonoscopy.  - [employer] 1.4 PO BID once diet advanced. Each supplement provides 455 kcal and 20 grams protein. - Encourage intake at all meals and of supplements.  - Monitor weight trends.  NUTRITION DIAGNOSIS:   Severe Malnutrition related to chronic illness (pancreatic cancer) as evidenced by severe fat depletion, severe muscle depletion, percent weight loss (35.5% in 1 year).  GOAL:   Patient will meet greater than or equal to 90% of their needs  MONITOR:   PO intake, Supplement acceptance, Weight trends  REASON FOR ASSESSMENT:   Malnutrition Screening Tool    ASSESSMENT:   63 y.o. female with PMH significant of pancreatic cancer (followed by [facility]), GERD, prediabetes, B12 deficiency, allergic rhinitis who presented with complaints of bright red blood and blood clots per rectum. Admitted for lower GI bleed.  Patient reports a UBW of 210# she last weighed ~1 year ago (prior to cancer diagnosis) and steady weight loss since that time.  Per EMR, patient weighed at 206# in April 2024 and dropped to 133# by April 2025. This is a 73# or 35.5% weight loss in one year, which is significant and severe for the time frame. Weight appears stable over the past month.   Patient endorses eating very well at home. Usually consumes 4 meals a day. Has a biscuit for a restaurant or toast and cereal for breakfast, chicken strips or a hamburger for lunch and dinner, and a deli meat sandwich in the evening. Eating well and appetite normal PTA. Has tried to consume ONS like Ensure but notes they tend to go right through her. She endorses taking Creon PTA per the direction of her oncology RD and MD. Notes she has concerns about taking this and if it was helping her or making things worse. Patient not currently ordered any this admission. Encouraged  patient to discuss with her MD.  She is currently NPO for colonoscopy today. Was on clear liquids yesterday and ate 100% of meals.  Discussed ongoing increased nutrient needs and encouraged intake at all 3 meals plus more once diet advanced. Patient hesitantly agreeable to try Johny Nap for a plant based protein supplement.    Medications reviewed and include: -  Labs reviewed:  -   NUTRITION - FOCUSED PHYSICAL EXAM:  Flowsheet Row Most Recent Value  Orbital Region Mild depletion  Upper Arm Region Severe depletion  Thoracic and Lumbar Region Severe depletion  Buccal Region Mild depletion  Temple Region Moderate depletion  Clavicle Bone Region Severe depletion  Clavicle and Acromion Bone Region Severe depletion  Scapular Bone Region Unable to assess  Dorsal Hand Moderate depletion  Patellar Region Mild depletion  Anterior Thigh Region Mild depletion  Posterior Calf Region Mild depletion  Edema (RD Assessment) None  Hair Reviewed  Eyes Reviewed  Mouth Reviewed  Skin Reviewed  Nails Reviewed       Diet Order:   Diet Order             Diet NPO time specified  Diet effective now                   EDUCATION NEEDS:  Education needs have been addressed  Skin:  Skin Assessment: Reviewed RN Assessment  Last BM:  5/20 - type 5  Height:  Ht Readings from Last 1 Encounters:  10/08/23 5\' 5"  (1.651 m)  Weight:  Wt Readings from Last 1 Encounters:  10/08/23 61.3 kg   BMI:  Body mass index is 22.49 kg/m.  Estimated Nutritional Needs:  Kcal:  1850-2000 kcals Protein:  90-105 grams Fluid:  >/= 1.9L    Scheryl Cushing RD, LDN Contact via Secure Chat.

## 2023-10-11 NOTE — Anesthesia Preprocedure Evaluation (Addendum)
 Anesthesia Evaluation  Patient identified by MRN, date of birth, ID band Patient awake    Reviewed: Allergy & Precautions, H&P , NPO status , Patient's Chart, lab work & pertinent test results  History of Anesthesia Complications (+) PONV and history of anesthetic complications  Airway Mallampati: III  TM Distance: >3 FB Neck ROM: Full    Dental no notable dental hx. (+) Teeth Intact, Dental Advisory Given   Pulmonary neg pulmonary ROS   Pulmonary exam normal breath sounds clear to auscultation       Cardiovascular negative cardio ROS  Rhythm:Regular Rate:Normal     Neuro/Psych negative neurological ROS  negative psych ROS   GI/Hepatic Neg liver ROS,GERD  Medicated,,  Endo/Other  Hypothyroidism    Renal/GU negative Renal ROS  negative genitourinary   Musculoskeletal  (+) Arthritis , Osteoarthritis,    Abdominal   Peds  Hematology  (+) Blood dyscrasia, anemia   Anesthesia Other Findings   Reproductive/Obstetrics negative OB ROS                             Anesthesia Physical Anesthesia Plan  ASA: 2  Anesthesia Plan: MAC   Post-op Pain Management: Minimal or no pain anticipated   Induction: Intravenous  PONV Risk Score and Plan: 3 and Propofol  infusion and Treatment may vary due to age or medical condition  Airway Management Planned: Natural Airway and Simple Face Mask  Additional Equipment:   Intra-op Plan:   Post-operative Plan:   Informed Consent: I have reviewed the patients History and Physical, chart, labs and discussed the procedure including the risks, benefits and alternatives for the proposed anesthesia with the patient or authorized representative who has indicated his/her understanding and acceptance.     Dental advisory given  Plan Discussed with: CRNA  Anesthesia Plan Comments:        Anesthesia Quick Evaluation

## 2023-10-11 NOTE — Plan of Care (Signed)

## 2023-10-11 NOTE — Anesthesia Postprocedure Evaluation (Signed)
 Anesthesia Post Note  Patient: Nicole Johnson  Procedure(s) Performed: EGD (ESOPHAGOGASTRODUODENOSCOPY) COLONOSCOPY     Patient location during evaluation: Endoscopy Anesthesia Type: MAC Level of consciousness: awake and alert Pain management: pain level controlled Vital Signs Assessment: post-procedure vital signs reviewed and stable Respiratory status: spontaneous breathing, nonlabored ventilation and respiratory function stable Cardiovascular status: stable and blood pressure returned to baseline Postop Assessment: no apparent nausea or vomiting Anesthetic complications: no  No notable events documented.  Last Vitals:  Vitals:   10/11/23 1907 10/11/23 2057  BP: 113/72 108/65  Pulse: 66 72  Resp: 14 18  Temp: (!) 36.3 C 36.4 C  SpO2: 100% 100%    Last Pain:  Vitals:   10/11/23 2057  TempSrc: Oral  PainSc:                  Steffany Schoenfelder,W. EDMOND

## 2023-10-12 ENCOUNTER — Encounter (HOSPITAL_COMMUNITY): Payer: Self-pay | Admitting: Gastroenterology

## 2023-10-12 DIAGNOSIS — E43 Unspecified severe protein-calorie malnutrition: Secondary | ICD-10-CM

## 2023-10-12 DIAGNOSIS — K922 Gastrointestinal hemorrhage, unspecified: Secondary | ICD-10-CM | POA: Diagnosis not present

## 2023-10-12 DIAGNOSIS — D61818 Other pancytopenia: Secondary | ICD-10-CM

## 2023-10-12 DIAGNOSIS — D649 Anemia, unspecified: Secondary | ICD-10-CM

## 2023-10-12 DIAGNOSIS — K219 Gastro-esophageal reflux disease without esophagitis: Secondary | ICD-10-CM

## 2023-10-12 DIAGNOSIS — C250 Malignant neoplasm of head of pancreas: Secondary | ICD-10-CM

## 2023-10-12 LAB — CBC WITH DIFFERENTIAL/PLATELET
Abs Immature Granulocytes: 0.01 10*3/uL (ref 0.00–0.07)
Basophils Absolute: 0 10*3/uL (ref 0.0–0.1)
Basophils Relative: 1 %
Eosinophils Absolute: 0 10*3/uL (ref 0.0–0.5)
Eosinophils Relative: 2 %
HCT: 29.2 % — ABNORMAL LOW (ref 36.0–46.0)
Hemoglobin: 9.2 g/dL — ABNORMAL LOW (ref 12.0–15.0)
Immature Granulocytes: 1 %
Lymphocytes Relative: 13 %
Lymphs Abs: 0.3 10*3/uL — ABNORMAL LOW (ref 0.7–4.0)
MCH: 30.3 pg (ref 26.0–34.0)
MCHC: 31.5 g/dL (ref 30.0–36.0)
MCV: 96.1 fL (ref 80.0–100.0)
Monocytes Absolute: 0.2 10*3/uL (ref 0.1–1.0)
Monocytes Relative: 8 %
Neutro Abs: 1.4 10*3/uL — ABNORMAL LOW (ref 1.7–7.7)
Neutrophils Relative %: 75 %
Platelets: 94 10*3/uL — ABNORMAL LOW (ref 150–400)
RBC: 3.04 MIL/uL — ABNORMAL LOW (ref 3.87–5.11)
RDW: 13.9 % (ref 11.5–15.5)
WBC: 1.9 10*3/uL — ABNORMAL LOW (ref 4.0–10.5)
nRBC: 0 % (ref 0.0–0.2)

## 2023-10-12 LAB — BASIC METABOLIC PANEL WITH GFR
Anion gap: 5 (ref 5–15)
BUN: 11 mg/dL (ref 8–23)
CO2: 24 mmol/L (ref 22–32)
Calcium: 8.5 mg/dL — ABNORMAL LOW (ref 8.9–10.3)
Chloride: 107 mmol/L (ref 98–111)
Creatinine, Ser: 0.68 mg/dL (ref 0.44–1.00)
GFR, Estimated: >60 mL/min (ref >60)
Glucose, Bld: 88 mg/dL (ref 70–99)
Potassium: 4 mmol/L (ref 3.5–5.1)
Sodium: 136 mmol/L (ref 135–145)

## 2023-10-12 MED ORDER — OMEPRAZOLE 20 MG PO CPDR: 20.0000 mg | DELAYED_RELEASE_CAPSULE | Freq: Two times a day (BID) | ORAL | Status: DC

## 2023-10-12 MED ORDER — NADOLOL 20 MG PO TABS
20.0000 mg | ORAL_TABLET | Freq: Every day | ORAL | 2 refills | Status: DC
Start: 2023-10-12 — End: 2023-10-24

## 2023-10-12 MED ORDER — HEPARIN SOD (PORK) LOCK FLUSH 100 UNIT/ML IV SOLN
500.0000 [IU] | INTRAVENOUS | Status: AC | PRN
  Administered 2023-10-12: 500 [IU]
  Filled 2023-10-12: qty 5

## 2023-10-12 NOTE — Hospital Course (Addendum)
 63 year old woman PMH including pancreatic cancer presented with bright red blood and clots per rectum.  Admitted for further evaluation of bleeding.  Seen by gastroenterology, underwent CT angiogram which was negative for bleeding.  Transfused PRBC.  Hemoglobin stabilized, underwent EGD and colonoscopy which showed portal hypertension but no active bleeding.  GI recommended low-dose nadolol and follow-up in the outpatient setting.  Consultants GI  Procedures/Events 5/20 EGD. Normal esophagus, erythematous mucosa stomach, erythematous duodenopathy.  GI recommended trial of nadolol 20 mg daily for treatment of portal hypertension, follow-up in office in 2 weeks with Dr. Nickey Barn. 5/20 colonoscopy.  Examined colon normal.

## 2023-10-12 NOTE — Discharge Summary (Signed)
 Physician Discharge Summary   Patient: Nicole Johnson MRN: [id number] DOB: [birth year]  Admit date:     10/08/2023  Discharge date: 10/12/23  Discharge Physician: Jerline Moon   PCP: Clemens Curt, MD   Recommendations at discharge:   Follow-up hematochezia.  New start on nadolol, follow blood pressure and heart rate.  Follow-up pancytopenia.  Discharge Diagnoses: Principal Problem:   Acute lower GI bleeding Active Problems:   Pancreatic cancer (HCC)   Hyponatremia   Gastroesophageal reflux disease   Prediabetes   Moderate protein malnutrition (HCC)   Pancytopenia (HCC)   Symptomatic anemia   Protein-calorie malnutrition, severe  Resolved Problems:   * No resolved hospital problems. *  Hospital Course: 62 year old woman PMH including pancreatic cancer presented with bright red blood and clots per rectum.  Admitted for further evaluation of bleeding.  Seen by gastroenterology, underwent CT angiogram which was negative for bleeding.  Transfused PRBC.  Hemoglobin stabilized, underwent EGD and colonoscopy which showed portal hypertension but no active bleeding.  GI recommended low-dose nadolol and follow-up in the outpatient setting.  Consultants GI  Procedures/Events 5/20 EGD. Normal esophagus, erythematous mucosa stomach, erythematous duodenopathy.  GI recommended trial of nadolol 20 mg daily for treatment of portal hypertension, follow-up in office in 2 weeks with Dr. Nickey Barn. 5/20 colonoscopy.  Examined colon normal.  Lower GI bleed/hematochezia Symptomatic anemia Portal hypertension of unclear etiology Splenomegaly Hematochezia resolved.  Hemoglobin remained stable.  Responded well to PRBC transfusion. EGD and colonoscopy as above.  No active sign of bleeding.  Portal hypertension seen of unclear etiology.  GI recommended low-dose nadolol and follow-up in the outpatient setting.   Hypotension Resolved.   Hyponatremia Resolved.   Pancytopenia (HCC) Stable here.  Last  seen by [facility] hematology 09/29/2023.  Thought secondary to chemotherapy and splenomegaly.  Plan for outpatient bone marrow biopsy, Nplate.   Pancreatic cancer (HCC) Suspected pancreatic insufficiency Status post chemotherapy and radiation  Follow-up with oncology at [facility].   PMH perforated cholecystitis status post cholecystostomy and stent January 2025   Gastroesophageal reflux disease Continue pantoprazole     Biloma Abnormality of liver on imaging as biloma per GI.     Severe malnutrition in context of chronic illness [employer] 1.4 PO BID once diet advanced. Each supplement provides 455 kcal and 20 grams protein. Encourage intake at all meals and of supplements.  Monitor weight trends.   Aortic atherosclerosis      Disposition: Home Diet recommendation:  Regular diet DISCHARGE MEDICATION: Allergies as of 10/12/2023       Reactions   Other Nausea Only, Other (See Comments)   Certain types of Anesthesia cause SEVERE NAUSEA   Codeine Nausea Only   Hydrocodone -acetaminophen  Other (See Comments)   Meloxicam Nausea Only   Tramadol Nausea Only        Medication List     TAKE these medications    cetirizine 10 MG chewable tablet Commonly known as: ZYRTEC Chew 10 mg by mouth daily. As needed   Creon 24000-76000 units Cpep Generic drug: Pancrelipase (Lip-Prot-Amyl) Take 1 capsule by mouth before each meal, and take 1 capsule before snacks.   nadolol 20 MG tablet Commonly known as: Corgard Take 1 tablet (20 mg total) by mouth daily.   omeprazole  20 MG capsule Commonly known as: PRILOSEC Take 1 capsule (20 mg total) by mouth 2 (two) times daily before a meal. What changed: See the new instructions.   PROBIOTIC DAILY PO Take 1 tablet by mouth daily.  Follow-up Information     Alvis Jourdain, MD Follow up in 2 week(s).   Specialty: Gastroenterology Contact information: [address] [place] [address] [phone]                 Feels fine.  No bleeding.  Discharge Exam: Filed Weights   10/08/23 1122 10/08/23 1541  Weight: 61.2 kg 61.3 kg   Physical Exam Vitals reviewed.  Constitutional:      General: She is not in acute distress.    Appearance: She is not ill-appearing or toxic-appearing.  Cardiovascular:     Rate and Rhythm: Normal rate and regular rhythm.     Heart sounds: No murmur heard. Pulmonary:     Effort: Pulmonary effort is normal. No respiratory distress.     Breath sounds: No wheezing, rhonchi or rales.  Neurological:     Mental Status: She is alert.  Psychiatric:        Mood and Affect: Mood normal.        Behavior: Behavior normal.      Condition at discharge: good  The results of significant diagnostics from this hospitalization (including imaging, microbiology, ancillary and laboratory) are listed below for reference.   Imaging Studies: CT ANGIO GI BLEED Result Date: 10/08/2023 CLINICAL DATA:  History of pancreatic cancer, right red blood per rectum for 1 week, diarrhea, cramping EXAM: CTA ABDOMEN AND PELVIS WITHOUT AND WITH CONTRAST TECHNIQUE: Multidetector CT imaging of the abdomen and pelvis was performed using the standard protocol during bolus administration of intravenous contrast. Multiplanar reconstructed images and MIPs were obtained and reviewed to evaluate the vascular anatomy. RADIATION DOSE REDUCTION: This exam was performed according to the departmental dose-optimization program which includes automated exposure control, adjustment of the mA and/or kV according to patient size and/or use of iterative reconstruction technique. CONTRAST:  OMNIPAQUE  IOHEXOL  350 MG/ML SOLN COMPARISON:  04/02/2023 FINDINGS: VASCULAR Aorta: Normal caliber aorta without aneurysm, dissection, vasculitis or significant stenosis. Celiac: Patent without evidence of aneurysm, dissection, or vasculitis. There is acute angulation at the origin of the celiac artery, with luminal narrowing  estimated 50%. SMA: Patent without evidence of aneurysm, dissection, vasculitis or significant stenosis. Renals: Both renal arteries are patent without evidence of aneurysm, dissection, vasculitis, fibromuscular dysplasia or significant stenosis. IMA: Patent without evidence of aneurysm, dissection, vasculitis or significant stenosis. Inflow: Patent without evidence of aneurysm, dissection, vasculitis or significant stenosis. Proximal Outflow: Bilateral common femoral and visualized portions of the superficial and profunda femoral arteries are patent without evidence of aneurysm, dissection, vasculitis or significant stenosis. Veins: The portal vein is patent, though there is severe extrinsic compression on the portal vein by the known hepatic mass. SMV and splenic vein are patent, with numerous venous collaterals throughout the mesentery and upper abdomen. No other significant venous findings. Review of the MIP images confirms the above findings. NON-VASCULAR Lower chest: No acute pleural or parenchymal lung disease. Hepatobiliary: Indwelling common bile duct stent, with pneumobilia consistent with stent patency. Gallbladder is decompressed, with nonspecific gallbladder wall thickening. Thick-walled cystic structure has developed within the right lobe liver adjacent to the gallbladder fossa, measuring 4.5 x 3.6 cm reference image 22/7. This does not appear contiguous with the gallbladder, and could represent hepatic abscess. The remainder of the liver is unremarkable. Pancreas: Ill-defined hypodense mass within the pancreatic head again noted, measuring approximately 3.5 x 2.5 cm reference image 28/7, previously 4.3 x 2.4 cm. Progressive atrophy of the pancreatic body and tail. Spleen: Spleen is enlarged measuring  19.3 cm in craniocaudal length. No focal parenchymal abnormality. Adrenals/Urinary Tract: The adrenals are unremarkable. Kidneys enhance normally. Bladder is moderately distended without filling defect.  Stomach/Bowel: No bowel obstruction or ileus. Gas fluid levels are seen throughout the colon consistent with given history of diarrhea. Wall thickening of the distal rectosigmoid colon consistent with inflammatory or infectious colitis. There is no evidence of intraluminal contrast accumulation to suggest active gastrointestinal hemorrhage. Lymphatic: No pathologic adenopathy. Reproductive: Uterus and bilateral adnexa are unremarkable. Other: There is a small amount of free fluid within the lower pelvis, nonspecific. No free intraperitoneal gas. No abdominal wall hernia. Musculoskeletal: No acute or destructive bony abnormalities. Reconstructed images demonstrate no additional findings. IMPRESSION: VASCULAR 1. No evidence of active gastrointestinal bleeding. 2.  Aortic Atherosclerosis (ICD10-I70.0). 3. 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 There is extrinsic compression on the portal vein, which demonstrates high-grade stenosis but remains patent. 4. Portal venous hypertension manifested by splenomegaly and venous collaterals within the upper abdomen. NON-VASCULAR 1. Distal colonic wall thickening compatible with inflammatory or infectious colitis. Gas fluid levels throughout the colon compatible with given history of diarrhea. 2. New thick walled cystic lesion within the right lobe liver measuring up to 4.5 cm, separate from the decompressed gallbladder and concerning for intrahepatic abscess. Please correlate with clinical presentation. 3. Grossly stable ill-defined hypodense mass in the pancreatic head consistent with history of pancreatic malignancy. 4. Diffuse gallbladder wall thickening, nonspecific given decompressed state. No evidence of calcified gallstones. 5. Small amount of pelvic free fluid, nonspecific. 6. Indwelling common bile duct stent, with pneumobilia compatible with stent patency. Electronically Signed   By: Bobbye Burrow M.D.   On:  10/08/2023 20:28    Microbiology: Results for orders placed or performed during the hospital encounter of 10/08/23  MRSA Next Gen by PCR, Nasal     Status: None   Collection Time: 10/08/23  4:24 PM   Specimen: Nasal Mucosa; Nasal Swab  Result Value Ref Range Status   MRSA by PCR Next Gen NOT DETECTED NOT DETECTED Final    Comment: (NOTE) The GeneXpert MRSA Assay (FDA approved for NASAL specimens only), is one component of a comprehensive MRSA colonization surveillance program. It is not intended to diagnose MRSA infection nor to guide or monitor treatment for MRSA infections. Test performance is not FDA approved in patients less than 105 years old. Performed at [facility], [address], [place], [place] [address]     Labs: CBC: Recent Labs  Lab 10/08/23 1216 10/08/23 1227 10/09/23 0130 10/09/23 0909 10/10/23 0314 10/11/23 0529 10/12/23 0344  WBC 1.2*  --   --  1.8* 1.7* 1.5* 1.9*  NEUTROABS 0.9*  --   --   --  1.3*  --  1.4*  HGB 7.0*   < > 7.4* 9.8* 9.1* 9.5* 9.2*  HCT 22.9*   < > 23.2* 30.2* 28.6* 29.3* 29.2*  MCV 99.1  --   --  95.0 94.7 96.1 96.1  PLT 94*  --   --  102* 105* 93* 94*   < > = values in this interval not displayed.   Basic Metabolic Panel: Recent Labs  Lab 10/08/23 1216 10/08/23 1227 10/09/23 0129 10/10/23 0314 10/11/23 0529 10/12/23 0344  NA 134* 137 134* 135 136 136  K 3.9 4.0 3.8 4.1 4.0 4.0  CL 102 101 103 101 105 107  CO2 25  --  25 25 26 24   GLUCOSE  83 78 79 113* 95 88  BUN 10 8 10 15 8 11   CREATININE 0.60 0.90 0.75 0.80 0.69 0.68  CALCIUM 8.4*  --  8.2* 8.6* 8.5* 8.5*   Liver Function Tests: Recent Labs  Lab 10/08/23 1216 10/09/23 0129  AST 28 29  ALT 31 29  ALKPHOS 172* 169*  BILITOT 0.4 0.8  PROT 5.8* 5.3*  ALBUMIN 2.7* 2.5*   CBG: No results for input(s): "GLUCAP" in the last 168 hours.  Discharge time spent: greater than 30 minutes.  Signed: Jerline Moon, MD [facility] 10/12/2023

## 2023-10-13 ENCOUNTER — Telehealth: Payer: Self-pay

## 2023-10-13 NOTE — Transitions of Care (Post Inpatient/ED Visit) (Signed)
   10/13/2023  Name: Nicole Johnson MRN: [id number] DOB: 1961-04-05  Today's TOC FU Call Status: Today's TOC FU Call Status:: Successful TOC FU Call Completed TOC FU Call Complete Date: 10/13/23 Patient's Name and Date of Birth confirmed.  Transition Care Management Follow-up Telephone Call Date of Discharge: 10/12/23 Discharge Facility: [facility]) Type of Discharge: Inpatient Admission Primary Inpatient Discharge Diagnosis:: esophagitis How have you been since you were released from the hospital?: Better Any questions or concerns?: No  Items Reviewed: Did you receive and understand the discharge instructions provided?: Yes Medications obtained,verified, and reconciled?: Yes (Medications Reviewed) Any new allergies since your discharge?: No Dietary orders reviewed?: Yes Do you have support at home?: Yes People in Home [RPT]: spouse  Medications Reviewed Today: Medications Reviewed Today     Reviewed by Darrall Ellison, LPN (Licensed Practical Nurse) on 10/13/23 at 1212  Med List Status: <None>   Medication Order Taking? Sig Documenting Provider Last Dose Status Informant  cetirizine (ZYRTEC) 10 MG chewable tablet [id number] No Chew 10 mg by mouth daily. As needed [provider] 10/08/2023 Morning Active Self, Pharmacy Records  CREON 24000-76000 units CPEP [id number] No Take 1 capsule by mouth before each meal, and take 1 capsule before snacks. [provider] 10/07/2023 Active Self, Pharmacy Records  nadolol (CORGARD) 20 MG tablet [id number]  Take 1 tablet (20 mg total) by mouth daily. Lonita Roach, MD  Active   omeprazole  (PRILOSEC) 20 MG capsule [id number]  Take 1 capsule (20 mg total) by mouth 2 (two) times daily before a meal. Lonita Roach, MD  Active   Probiotic Product (PROBIOTIC DAILY PO) [id number] No Take 1 tablet by mouth daily. [provider] 10/08/2023 Morning Active Self, Pharmacy Records            Home Care and  Equipment/Supplies: Were Home Health Services Ordered?: NA Any new equipment or medical supplies ordered?: NA  Functional Questionnaire: Do you need assistance with bathing/showering or dressing?: No Do you need assistance with meal preparation?: No Do you need assistance with eating?: No Do you have difficulty maintaining continence: No Do you need assistance with getting out of bed/getting out of a chair/moving?: No Do you have difficulty managing or taking your medications?: No  Follow up appointments reviewed: PCP Follow-up appointment confirmed?: Yes Date of PCP follow-up appointment?: 10/24/23 Follow-up Provider: [facility] Follow-up appointment confirmed?: No Reason Specialist Follow-Up Not Confirmed: Patient has Specialist Provider Number and will Call for Appointment Do you need transportation to your follow-up appointment?: No Do you understand care options if your condition(s) worsen?: Yes-patient verbalized understanding    SIGNATURE Darrall Ellison, LPN [facility] Nurse Health Advisor Direct Dial [phone]

## 2023-10-19 DIAGNOSIS — D500 Iron deficiency anemia secondary to blood loss (chronic): Secondary | ICD-10-CM | POA: Diagnosis not present

## 2023-10-20 DIAGNOSIS — D61818 Other pancytopenia: Secondary | ICD-10-CM | POA: Diagnosis not present

## 2023-10-24 ENCOUNTER — Encounter: Payer: Self-pay | Admitting: Family Medicine

## 2023-10-24 ENCOUNTER — Ambulatory Visit (INDEPENDENT_AMBULATORY_CARE_PROVIDER_SITE_OTHER): Admitting: Family Medicine

## 2023-10-24 ENCOUNTER — Ambulatory Visit: Payer: Self-pay | Admitting: Family Medicine

## 2023-10-24 ENCOUNTER — Telehealth: Payer: Self-pay

## 2023-10-24 VITALS — BP 108/62 | HR 77 | Temp 97.9°F | Ht 65.0 in | Wt 137.5 lb

## 2023-10-24 DIAGNOSIS — E871 Hypo-osmolality and hyponatremia: Secondary | ICD-10-CM | POA: Diagnosis not present

## 2023-10-24 DIAGNOSIS — R031 Nonspecific low blood-pressure reading: Secondary | ICD-10-CM

## 2023-10-24 DIAGNOSIS — C250 Malignant neoplasm of head of pancreas: Secondary | ICD-10-CM

## 2023-10-24 DIAGNOSIS — D61818 Other pancytopenia: Secondary | ICD-10-CM | POA: Diagnosis not present

## 2023-10-24 DIAGNOSIS — K219 Gastro-esophageal reflux disease without esophagitis: Secondary | ICD-10-CM

## 2023-10-24 DIAGNOSIS — D696 Thrombocytopenia, unspecified: Secondary | ICD-10-CM | POA: Diagnosis not present

## 2023-10-24 DIAGNOSIS — K766 Portal hypertension: Secondary | ICD-10-CM

## 2023-10-24 DIAGNOSIS — I959 Hypotension, unspecified: Secondary | ICD-10-CM | POA: Insufficient documentation

## 2023-10-24 DIAGNOSIS — K922 Gastrointestinal hemorrhage, unspecified: Secondary | ICD-10-CM

## 2023-10-24 LAB — CBC WITH DIFFERENTIAL/PLATELET
Basophils Absolute: 0 10*3/uL (ref 0.0–0.1)
Basophils Relative: 0.5 % (ref 0.0–3.0)
Eosinophils Absolute: 0 10*3/uL (ref 0.0–0.7)
Eosinophils Relative: 3.3 % (ref 0.0–5.0)
HCT: 26.4 % — ABNORMAL LOW (ref 36.0–46.0)
Hemoglobin: 8.8 g/dL — ABNORMAL LOW (ref 12.0–15.0)
Lymphocytes Relative: 15.8 % (ref 12.0–46.0)
Lymphs Abs: 0.2 10*3/uL — ABNORMAL LOW (ref 0.7–4.0)
MCHC: 33.4 g/dL (ref 30.0–36.0)
MCV: 90.5 fl (ref 78.0–100.0)
Monocytes Absolute: 0.1 10*3/uL (ref 0.1–1.0)
Monocytes Relative: 10.1 % (ref 3.0–12.0)
Neutro Abs: 0.9 10*3/uL — ABNORMAL LOW (ref 1.4–7.7)
Neutrophils Relative %: 70.3 % (ref 43.0–77.0)
Platelets: 75 10*3/uL — ABNORMAL LOW (ref 150.0–400.0)
RBC: 2.92 Mil/uL — ABNORMAL LOW (ref 3.87–5.11)
RDW: 14 % (ref 11.5–15.5)
WBC: 1.2 10*3/uL — CL (ref 4.0–10.5)

## 2023-10-24 LAB — BASIC METABOLIC PANEL WITH GFR
BUN: 10 mg/dL (ref 6–23)
CO2: 29 meq/L (ref 19–32)
Calcium: 8 mg/dL — ABNORMAL LOW (ref 8.4–10.5)
Chloride: 105 meq/L (ref 96–112)
Creatinine, Ser: 0.96 mg/dL (ref 0.40–1.20)
GFR: 63.66 mL/min (ref >60.00)
Glucose, Bld: 130 mg/dL — ABNORMAL HIGH (ref 70–99)
Potassium: 4 meq/L (ref 3.5–5.1)
Sodium: 138 meq/L (ref 135–145)

## 2023-10-24 MED ORDER — OMEPRAZOLE 20 MG PO CPDR: 20.0000 mg | DELAYED_RELEASE_CAPSULE | Freq: Two times a day (BID) | ORAL | 1 refills | Status: DC

## 2023-10-24 NOTE — Assessment & Plan Note (Signed)
 Pt did not tolerate beta blocker for portal hypertension  Blood pressure is improved/ still low normal Not light headed today Encouraged to liberalize salt and fluids   Bmet today

## 2023-10-24 NOTE — Assessment & Plan Note (Signed)
 Resolved Reviewed hospital records, lab results and studies in detail  ? If due to portal hypotension and also pancytopenia in setting of pancreatic cancer   Instructed to call asap/ seek care if bleeding resumes For heme/onc follow up soon  Reviewed recent note from GI -did not tolerate beta blocker or creon

## 2023-10-24 NOTE — Assessment & Plan Note (Signed)
 Re check bmet today Encouraged to liberalize salt for this and bp

## 2023-10-24 NOTE — Assessment & Plan Note (Signed)
 Urged to continue omeprazole  for this and esoph changes on EGD

## 2023-10-24 NOTE — Assessment & Plan Note (Signed)
?   If from previous chemo alone or new  Has BM bx report pending [facility] heme/onc

## 2023-10-24 NOTE — Telephone Encounter (Signed)
 Labs sent to hematologist and oncologist

## 2023-10-24 NOTE — Assessment & Plan Note (Addendum)
 Thought to be cause of recent GI bleed along with pancytopenia with pancreatic cancer Reviewed hospital records, lab results and studies in detail  Reviewed note Dr Nickey Barn (did not tolerate beta blocker) from last week Pt elects to stay off creon as well  No further bleeding  Cbc and bmet today For heme /onc follow up

## 2023-10-24 NOTE — Telephone Encounter (Signed)
 Please send a copy of labs to her hematologist at [facility]

## 2023-10-24 NOTE — Assessment & Plan Note (Signed)
 Continues onc care Has a stent and appetite is good Heme for pancytopenia Has a stent  Unable to gain weight Does not tolerate creon  Also intol of beta blocker for portal hypertension

## 2023-10-24 NOTE — Telephone Encounter (Signed)
 Theda Finical calling from lab to report following critical results:  WBC- 1.2.   Fwd message to Dr Malissa Se, notified [place] and documented in red notebook.

## 2023-10-24 NOTE — Patient Instructions (Addendum)
 Continue omeprazole    Keep eating  Stay hydrated If bleeding returns- let us  and your specialists know   Cbc today  Chemistries today   Follow up with hematology/ oncology as planned   You can eat more salty food and drink more fluids for blood pressure support

## 2023-10-24 NOTE — Progress Notes (Signed)
 Subjective:    Patient ID: Nicole Johnson, female    DOB: 11/24/1960, 63 y.o.   MRN: [id number]  HPI  Wt Readings from Last 3 Encounters:  10/24/23 137 lb 8 oz (62.4 kg)  10/08/23 135 lb 2.3 oz (61.3 kg)  08/25/23 133 lb 2 oz (60.4 kg)   22.88 kg/m  Vitals:   10/24/23 1157  BP: 108/62  Pulse: 77  Temp: 97.9 F (36.6 C)  SpO2: 100%     Pt presents for follow up of hospitalization from 5/17 to 5/21 for GI bleed She presented on day of admission with brb and clots per rectum in the setting of pancreatic cancer with chemo and bone marrow suppression and splenomegally  (currently off chemo due to low blood counts and platelets)  Was transfused  No evidence of active bleeding on CT angio (compression of portal vein noted) , splenomegaly noted  Has indwelling CBD stent  EGD and colonoscopy showed evidence of portal hypertension but no areas of active bleeding  Nadolol  was prescribed for the portal hypertension   From discharge diagnosis   Discharge Diagnoses: Principal Problem:   Acute lower GI bleeding Active Problems:   Pancreatic cancer (HCC)   Hyponatremia   Gastroesophageal reflux disease   Prediabetes   Moderate protein malnutrition (HCC)   Pancytopenia (HCC)   Symptomatic anemia   Protein-calorie malnutrition, severe   Resolved Problems:   * No resolved hospital problems. *   Hospital Course: 63 year old woman PMH including pancreatic cancer presented with bright red blood and clots per rectum.  Admitted for further evaluation of bleeding.  Seen by gastroenterology, underwent CT angiogram which was negative for bleeding.  Transfused PRBC.  Hemoglobin stabilized, underwent EGD and colonoscopy which showed portal hypertension but no active bleeding.  GI recommended low-dose nadolol  and follow-up in the outpatient setting.   Consultants GI   Procedures/Events 5/20 EGD. Normal esophagus, erythematous mucosa stomach, erythematous duodenopathy.  GI recommended trial  of nadolol  20 mg daily for treatment of portal hypertension, follow-up in office in 2 weeks with Dr. Nickey Barn. 5/20 colonoscopy.  Examined colon normal.   Lower GI bleed/hematochezia Symptomatic anemia Portal hypertension of unclear etiology Splenomegaly Hematochezia resolved.  Hemoglobin remained stable.  Responded well to PRBC transfusion. EGD and colonoscopy as above.  No active sign of bleeding.  Portal hypertension seen of unclear etiology.  GI recommended low-dose nadolol  and follow-up in the outpatient setting.   Hypotension Resolved.   Hyponatremia Resolved.   Pancytopenia (HCC) Stable here.  Last seen by [facility] hematology 09/29/2023.  Thought secondary to chemotherapy and splenomegaly.  Plan for outpatient bone marrow biopsy, Nplate.   Pancreatic cancer (HCC) Suspected pancreatic insufficiency Status post chemotherapy and radiation  Follow-up with oncology at [facility].   PMH perforated cholecystitis status post cholecystostomy and stent January 2025   Gastroesophageal reflux disease Continue pantoprazole     Biloma Abnormality of liver on imaging as biloma per GI.     Severe malnutrition in context of chronic illness [employer] 1.4 PO BID once diet advanced. Each supplement provides 455 kcal and 20 grams protein. Encourage intake at all meals and of supplements.  Monitor weight trends.   Aortic atherosclerosis   Labs Lab Results  Component Value Date   WBC 1.9 (L) 10/12/2023   HGB 9.2 (L) 10/12/2023   HCT 29.2 (L) 10/12/2023   MCV 96.1 10/12/2023   PLT 94 (L) 10/12/2023  Lowest Hb is 6.8   Did have a BM biopsy last week   (  had been a while since her chemo- last treatment before xmas) -did not tolerate chemo well and has not had much Waiting for that result  Has splenomegaly-that may be a cause of the low platelets   Lab Results  Component Value Date   NA 136 10/12/2023   K 4.0 10/12/2023   CO2 24 10/12/2023   GLUCOSE 88 10/12/2023   BUN 11 10/12/2023    CREATININE 0.68 10/12/2023   CALCIUM 8.5 (L) 10/12/2023   GFR 94.34 04/08/2023   GFRNONAA >60 10/12/2023   Lab Results  Component Value Date   ALT 29 10/09/2023   AST 29 10/09/2023   ALKPHOS 169 (H) 10/09/2023   BILITOT 0.8 10/09/2023    Has not had anymore bleeding  Notes at the same time she had increased her creon and it slowed her bms (? May have been straining also)   Is currently holding creon-feels better without it  Was taking it " to gain weight" She has been able to gain back hospital weight  Is hungry all the time and eats all the time   Was told she was supposed to take propranolol instead of nadolol   Then she tried that and it made her blood pressure too low  Did not tolerate it at all    On July 2nd she goes to oncologist for more blood work  Tumor is stable  No symptoms or pain from it   Today energy level is improved from hospital  Not quite back to baseline  Is tired at the end of the day  No abdominal pain /heartburn  Just a lot of gurgling (but less than when she took the creon)   Saw Dr Nickey Barn last week  Was ok with stopping creon and also holding the beta blocker        Patient Active Problem List   Diagnosis Date Noted   Portal hypertension (HCC) 10/24/2023   Borderline low blood pressure determined by examination 10/24/2023   Protein-calorie malnutrition, severe 10/11/2023   Acute lower GI bleeding 10/08/2023   Hypothyroidism 10/08/2023   Moderate protein malnutrition (HCC) 10/08/2023   Pancytopenia (HCC) 10/08/2023   Symptomatic anemia 10/08/2023   Pain of left thumb 08/25/2023   Bilateral lower extremity edema 04/08/2023   Thrombocytopenia (HCC) 04/03/2023   Hyponatremia 04/03/2023   Acute cholecystitis 04/03/2023   Genetic testing 01/28/2023   Pancreatic cancer (HCC) 01/06/2023   Jaundice 12/16/2022   Allergic rhinitis 09/09/2022   Mild anemia 02/10/2021   Routine general medical examination at a health care facility 01/01/2021    Muscle cramps 07/10/2020   Current use of proton pump inhibitor 07/10/2020   B12 deficiency 07/10/2020   Prediabetes 12/26/2019   Gastroesophageal reflux disease 12/02/2017   Stress incontinence of urine 07/26/2016   Past Medical History:  Diagnosis Date   Arthritis    Cancer (HCC)    pancreatic   GERD (gastroesophageal reflux disease)    PONV (postoperative nausea and vomiting)    Prediabetes    Past Surgical History:  Procedure Laterality Date   BILIARY BRUSHING  12/17/2022   Procedure: BILIARY BRUSHING;  Surgeon: Alvis Jourdain, MD;  Location: [facility];  Service: Gastroenterology;;   BILIARY STENT PLACEMENT N/A 12/17/2022   Procedure: BILIARY STENT PLACEMENT;  Surgeon: Alvis Jourdain, MD;  Location: [facility];  Service: Gastroenterology;  Laterality: N/A;   BILIARY STENT PLACEMENT N/A 04/05/2023   Procedure: BILIARY STENT PLACEMENT;  Surgeon: Alvis Jourdain, MD;  Location: [facility];  Service: Gastroenterology;  Laterality: N/A;   COLONOSCOPY N/A 10/11/2023   Procedure: COLONOSCOPY;  Surgeon: Alvis Jourdain, MD;  Location: [facility];  Service: Gastroenterology;  Laterality: N/A;   ERCP N/A 12/17/2022   Procedure: ENDOSCOPIC RETROGRADE CHOLANGIOPANCREATOGRAPHY (ERCP);  Surgeon: Alvis Jourdain, MD;  Location: [facility];  Service: Gastroenterology;  Laterality: N/A;   ERCP N/A 04/05/2023   Procedure: ENDOSCOPIC RETROGRADE CHOLANGIOPANCREATOGRAPHY (ERCP);  Surgeon: Alvis Jourdain, MD;  Location: [facility];  Service: Gastroenterology;  Laterality: N/A;   ESOPHAGOGASTRODUODENOSCOPY N/A 10/11/2023   Procedure: EGD (ESOPHAGOGASTRODUODENOSCOPY);  Surgeon: Alvis Jourdain, MD;  Location: [facility];  Service: Gastroenterology;  Laterality: N/A;   ESOPHAGOGASTRODUODENOSCOPY (EGD) WITH PROPOFOL  N/A 12/18/2022   Procedure: ESOPHAGOGASTRODUODENOSCOPY (EGD) WITH PROPOFOL ;  Surgeon: Alvis Jourdain, MD;  Location: [facility];  Service: Gastroenterology;  Laterality: N/A;    ESOPHAGOGASTRODUODENOSCOPY (EGD) WITH PROPOFOL  N/A 12/30/2022   Procedure: ESOPHAGOGASTRODUODENOSCOPY (EGD) WITH PROPOFOL ;  Surgeon: Alvis Jourdain, MD;  Location: [facility];  Service: Gastroenterology;  Laterality: N/A;   EUS N/A 12/18/2022   Procedure: UPPER ENDOSCOPIC ULTRASOUND (EUS) LINEAR;  Surgeon: Alvis Jourdain, MD;  Location: [facility];  Service: Gastroenterology;  Laterality: N/A;   FINE NEEDLE ASPIRATION N/A 12/18/2022   Procedure: FINE NEEDLE ASPIRATION (FNA) LINEAR;  Surgeon: Alvis Jourdain, MD;  Location: [facility];  Service: Gastroenterology;  Laterality: N/A;   FINE NEEDLE ASPIRATION  12/30/2022   Procedure: FINE NEEDLE ASPIRATION (FNA) LINEAR;  Surgeon: Alvis Jourdain, MD;  Location: [facility];  Service: Gastroenterology;;   LAPAROSCOPIC ABDOMINAL EXPLORATION     SPHINCTEROTOMY  12/17/2022   Procedure: [address];  Surgeon: Alvis Jourdain, MD;  Location: [facility];  Service: Gastroenterology;;   [place] REMOVAL  04/05/2023   Procedure: STENT REMOVAL;  Surgeon: Alvis Jourdain, MD;  Location: [facility];  Service: Gastroenterology;;   UPPER ESOPHAGEAL ENDOSCOPIC ULTRASOUND (EUS) N/A 12/30/2022   Procedure: UPPER ESOPHAGEAL ENDOSCOPIC ULTRASOUND (EUS);  Surgeon: Alvis Jourdain, MD;  Location: [facility];  Service: Gastroenterology;  Laterality: N/A;   Social History   Tobacco Use   Smoking status: Never   Smokeless tobacco: Never  Vaping Use   Vaping status: Never Used  Substance Use Topics   Alcohol  use: Not Currently    Comment: occ   Drug use: Never   Family History  Problem Relation Age of Onset   Diabetes Mother    Stroke Mother    Lung cancer Father 25       smoked   Diabetes Father    Diabetes Sister    Breast cancer Sister 19   Diabetes Brother    Cancer Brother 60       neck cancer   Heart disease Maternal Grandmother    Breast cancer Maternal Grandmother        dx. <50, double mastectomy   Heart disease Maternal Grandfather    Allergies   Allergen Reactions   Other Nausea Only and Other (See Comments)    Certain types of Anesthesia cause SEVERE NAUSEA   Codeine Nausea Only   Hydrocodone -Acetaminophen  Other (See Comments)   Meloxicam Nausea Only   Tramadol Nausea Only   Current Outpatient Medications on File Prior to Visit  Medication Sig Dispense Refill   cetirizine (ZYRTEC) 10 MG chewable tablet Chew 10 mg by mouth daily. As needed     Probiotic Product (PROBIOTIC DAILY PO) Take 1 tablet by mouth daily.     CREON 24000-76000 units CPEP Take 1 capsule by mouth before each meal, and take 1 capsule before snacks. (Patient not taking: Reported on 10/24/2023)  No current facility-administered medications on file prior to visit.    Review of Systems  Constitutional:  Positive for unexpected weight change. Negative for activity change, appetite change, fatigue and fever.  HENT:  Negative for congestion, ear pain, rhinorrhea, sinus pressure and sore throat.   Eyes:  Negative for pain, redness and visual disturbance.  Respiratory:  Negative for cough, shortness of breath and wheezing.   Cardiovascular:  Negative for chest pain and palpitations.  Gastrointestinal:  Negative for abdominal pain, blood in stool, constipation and diarrhea.  Endocrine: Negative for polydipsia and polyuria.  Genitourinary:  Negative for dysuria, frequency and urgency.  Musculoskeletal:  Negative for arthralgias, back pain and myalgias.  Skin:  Negative for pallor and rash.  Allergic/Immunologic: Negative for environmental allergies.  Neurological:  Negative for dizziness, syncope and headaches.  Hematological:  Negative for adenopathy. Does not bruise/bleed easily.  Psychiatric/Behavioral:  Negative for decreased concentration and dysphoric mood. The patient is not nervous/anxious.        Objective:   Physical Exam Constitutional:      General: She is not in acute distress.    Appearance: Normal appearance. She is well-developed and normal  weight. She is not ill-appearing or diaphoretic.  HENT:     Head: Normocephalic and atraumatic.  Eyes:     Conjunctiva/sclera: Conjunctivae normal.     Pupils: Pupils are equal, round, and reactive to light.  Neck:     Thyroid : No thyromegaly.     Vascular: No carotid bruit or JVD.  Cardiovascular:     Rate and Rhythm: Normal rate and regular rhythm.     Heart sounds: Normal heart sounds.     No gallop.  Pulmonary:     Effort: Pulmonary effort is normal. No respiratory distress.     Breath sounds: Normal breath sounds. No stridor. No wheezing, rhonchi or rales.  Abdominal:     General: There is no distension or abdominal bruit.     Palpations: Abdomen is soft. There is no mass.     Tenderness: There is no abdominal tenderness. There is no guarding or rebound.     Hernia: No hernia is present.  Musculoskeletal:     Cervical back: Normal range of motion and neck supple.     Right lower leg: No edema.     Left lower leg: No edema.  Lymphadenopathy:     Cervical: No cervical adenopathy.  Skin:    General: Skin is warm and dry.     Coloration: Skin is not pale.     Findings: No rash.     Comments: Sallow complexion   Neurological:     Mental Status: She is alert.     Motor: No weakness.     Coordination: Coordination normal.     Deep Tendon Reflexes: Reflexes are normal and symmetric. Reflexes normal.  Psychiatric:        Mood and Affect: Mood normal.           Assessment & Plan:   Problem List Items Addressed This Visit       Cardiovascular and Mediastinum   Portal hypertension (HCC) - Primary   Thought to be cause of recent GI bleed along with pancytopenia with pancreatic cancer Reviewed hospital records, lab results and studies in detail  Reviewed note Dr Nickey Barn (did not tolerate beta blocker) from last week Pt elects to stay off creon as well  No further bleeding  Cbc and bmet today For heme /onc follow up  Borderline low blood pressure determined  by examination   Pt did not tolerate beta blocker for portal hypertension  Blood pressure is improved/ still low normal Not light headed today Encouraged to liberalize salt and fluids   Bmet today      Relevant Orders   Basic metabolic panel with GFR     Digestive   Pancreatic cancer (HCC)   Continues onc care Has a stent and appetite is good Heme for pancytopenia Has a stent  Unable to gain weight Does not tolerate creon  Also intol of beta blocker for portal hypertension       Gastroesophageal reflux disease   Urged to continue omeprazole  for this and esoph changes on EGD      Relevant Medications   omeprazole  (PRILOSEC) 20 MG capsule   Acute lower GI bleeding   Resolved Reviewed hospital records, lab results and studies in detail  ? If due to portal hypotension and also pancytopenia in setting of pancreatic cancer   Instructed to call asap/ seek care if bleeding resumes For heme/onc follow up soon  Reviewed recent note from GI -did not tolerate beta blocker or creon         Hematopoietic and Hemostatic   Thrombocytopenia (HCC)   Relevant Orders   CBC with Differential/Platelet   Pancytopenia (HCC)   ? If from previous chemo alone or new  Has BM bx report pending [facility] heme/onc       Relevant Orders   CBC with Differential/Platelet     Other   Hyponatremia   Re check bmet today Encouraged to liberalize salt for this and bp      Relevant Orders   Basic metabolic panel with GFR

## 2023-11-01 DIAGNOSIS — D61818 Other pancytopenia: Secondary | ICD-10-CM | POA: Diagnosis not present

## 2023-11-23 DIAGNOSIS — K769 Liver disease, unspecified: Secondary | ICD-10-CM | POA: Diagnosis not present

## 2023-11-23 DIAGNOSIS — K668 Other specified disorders of peritoneum: Secondary | ICD-10-CM | POA: Diagnosis not present

## 2023-11-23 DIAGNOSIS — D61818 Other pancytopenia: Secondary | ICD-10-CM | POA: Diagnosis not present

## 2023-11-23 DIAGNOSIS — C250 Malignant neoplasm of head of pancreas: Secondary | ICD-10-CM | POA: Diagnosis not present

## 2023-11-23 DIAGNOSIS — Z923 Personal history of irradiation: Secondary | ICD-10-CM | POA: Diagnosis not present

## 2023-11-23 DIAGNOSIS — R911 Solitary pulmonary nodule: Secondary | ICD-10-CM | POA: Diagnosis not present

## 2023-11-23 DIAGNOSIS — K838 Other specified diseases of biliary tract: Secondary | ICD-10-CM | POA: Diagnosis not present

## 2023-11-30 DIAGNOSIS — Z884 Allergy status to anesthetic agent status: Secondary | ICD-10-CM | POA: Diagnosis not present

## 2023-11-30 DIAGNOSIS — Z888 Allergy status to other drugs, medicaments and biological substances status: Secondary | ICD-10-CM | POA: Diagnosis not present

## 2023-11-30 DIAGNOSIS — Z4659 Encounter for fitting and adjustment of other gastrointestinal appliance and device: Secondary | ICD-10-CM | POA: Diagnosis not present

## 2023-11-30 DIAGNOSIS — Z885 Allergy status to narcotic agent status: Secondary | ICD-10-CM | POA: Diagnosis not present

## 2023-11-30 DIAGNOSIS — K831 Obstruction of bile duct: Secondary | ICD-10-CM | POA: Diagnosis not present

## 2023-11-30 DIAGNOSIS — K838 Other specified diseases of biliary tract: Secondary | ICD-10-CM | POA: Diagnosis not present

## 2023-11-30 DIAGNOSIS — R933 Abnormal findings on diagnostic imaging of other parts of digestive tract: Secondary | ICD-10-CM | POA: Diagnosis not present

## 2023-11-30 DIAGNOSIS — Z87891 Personal history of nicotine dependence: Secondary | ICD-10-CM | POA: Diagnosis not present

## 2023-11-30 DIAGNOSIS — C250 Malignant neoplasm of head of pancreas: Secondary | ICD-10-CM | POA: Diagnosis not present

## 2023-11-30 DIAGNOSIS — Z792 Long term (current) use of antibiotics: Secondary | ICD-10-CM | POA: Diagnosis not present

## 2023-11-30 DIAGNOSIS — Z9689 Presence of other specified functional implants: Secondary | ICD-10-CM | POA: Diagnosis not present

## 2023-12-01 DIAGNOSIS — D61818 Other pancytopenia: Secondary | ICD-10-CM | POA: Diagnosis not present

## 2023-12-01 DIAGNOSIS — C250 Malignant neoplasm of head of pancreas: Secondary | ICD-10-CM | POA: Diagnosis not present

## 2023-12-23 DIAGNOSIS — C250 Malignant neoplasm of head of pancreas: Secondary | ICD-10-CM | POA: Diagnosis not present

## 2023-12-23 DIAGNOSIS — D61818 Other pancytopenia: Secondary | ICD-10-CM | POA: Diagnosis not present

## 2024-01-05 DIAGNOSIS — Z87891 Personal history of nicotine dependence: Secondary | ICD-10-CM | POA: Diagnosis not present

## 2024-01-05 DIAGNOSIS — K3189 Other diseases of stomach and duodenum: Secondary | ICD-10-CM | POA: Diagnosis not present

## 2024-01-05 DIAGNOSIS — C259 Malignant neoplasm of pancreas, unspecified: Secondary | ICD-10-CM | POA: Diagnosis not present

## 2024-01-05 DIAGNOSIS — Z9221 Personal history of antineoplastic chemotherapy: Secondary | ICD-10-CM | POA: Diagnosis not present

## 2024-01-05 DIAGNOSIS — R1084 Generalized abdominal pain: Secondary | ICD-10-CM | POA: Diagnosis not present

## 2024-01-05 DIAGNOSIS — Z923 Personal history of irradiation: Secondary | ICD-10-CM | POA: Diagnosis not present

## 2024-01-05 DIAGNOSIS — R110 Nausea: Secondary | ICD-10-CM | POA: Diagnosis not present

## 2024-01-05 DIAGNOSIS — R1011 Right upper quadrant pain: Secondary | ICD-10-CM | POA: Diagnosis not present

## 2024-01-05 DIAGNOSIS — K7460 Unspecified cirrhosis of liver: Secondary | ICD-10-CM | POA: Diagnosis not present

## 2024-01-05 DIAGNOSIS — R188 Other ascites: Secondary | ICD-10-CM | POA: Diagnosis not present

## 2024-01-05 DIAGNOSIS — K766 Portal hypertension: Secondary | ICD-10-CM | POA: Diagnosis not present

## 2024-01-11 ENCOUNTER — Telehealth: Payer: Self-pay

## 2024-01-11 ENCOUNTER — Ambulatory Visit: Payer: Self-pay | Admitting: Family Medicine

## 2024-01-11 ENCOUNTER — Encounter: Payer: Self-pay | Admitting: Family Medicine

## 2024-01-11 ENCOUNTER — Ambulatory Visit (INDEPENDENT_AMBULATORY_CARE_PROVIDER_SITE_OTHER): Admitting: Family Medicine

## 2024-01-11 VITALS — BP 106/58 | HR 84 | Temp 97.9°F | Ht 65.0 in | Wt 146.4 lb

## 2024-01-11 DIAGNOSIS — D61818 Other pancytopenia: Secondary | ICD-10-CM | POA: Diagnosis not present

## 2024-01-11 DIAGNOSIS — R188 Other ascites: Secondary | ICD-10-CM | POA: Insufficient documentation

## 2024-01-11 DIAGNOSIS — K766 Portal hypertension: Secondary | ICD-10-CM

## 2024-01-11 DIAGNOSIS — C250 Malignant neoplasm of head of pancreas: Secondary | ICD-10-CM | POA: Diagnosis not present

## 2024-01-11 DIAGNOSIS — D649 Anemia, unspecified: Secondary | ICD-10-CM

## 2024-01-11 DIAGNOSIS — K219 Gastro-esophageal reflux disease without esophagitis: Secondary | ICD-10-CM

## 2024-01-11 LAB — BASIC METABOLIC PANEL WITH GFR
BUN: 12 mg/dL (ref 6–23)
CO2: 31 meq/L (ref 19–32)
Calcium: 8.4 mg/dL (ref 8.4–10.5)
Chloride: 101 meq/L (ref 96–112)
Creatinine, Ser: 0.81 mg/dL (ref 0.40–1.20)
GFR: 77.93 mL/min (ref >60.00)
Glucose, Bld: 124 mg/dL — ABNORMAL HIGH (ref 70–99)
Potassium: 3.9 meq/L (ref 3.5–5.1)
Sodium: 136 meq/L (ref 135–145)

## 2024-01-11 LAB — HEPATIC FUNCTION PANEL
ALT: 55 U/L — ABNORMAL HIGH (ref 0–35)
AST: 36 U/L (ref 0–37)
Albumin: 3 g/dL — ABNORMAL LOW (ref 3.5–5.2)
Alkaline Phosphatase: 303 U/L — ABNORMAL HIGH (ref 39–117)
Bilirubin, Direct: 0.1 mg/dL (ref 0.0–0.3)
Total Bilirubin: 0.3 mg/dL (ref 0.2–1.2)
Total Protein: 6 g/dL (ref 6.0–8.3)

## 2024-01-11 LAB — CBC WITH DIFFERENTIAL/PLATELET
Basophils Absolute: 0 K/uL (ref 0.0–0.1)
Basophils Relative: 0.3 % (ref 0.0–3.0)
Eosinophils Absolute: 0 K/uL (ref 0.0–0.7)
Eosinophils Relative: 3 % (ref 0.0–5.0)
HCT: 23.8 % — CL (ref 36.0–46.0)
Hemoglobin: 7.6 g/dL — CL (ref 12.0–15.0)
Lymphocytes Relative: 14.3 % (ref 12.0–46.0)
Lymphs Abs: 0.2 K/uL — ABNORMAL LOW (ref 0.7–4.0)
MCHC: 32 g/dL (ref 30.0–36.0)
MCV: 86.3 fl (ref 78.0–100.0)
Monocytes Absolute: 0.2 K/uL (ref 0.1–1.0)
Monocytes Relative: 13.8 % — ABNORMAL HIGH (ref 3.0–12.0)
Neutro Abs: 0.8 K/uL — ABNORMAL LOW (ref 1.4–7.7)
Neutrophils Relative %: 68.6 % (ref 43.0–77.0)
Platelets: 100 K/uL — ABNORMAL LOW (ref 150.0–400.0)
RBC: 2.76 Mil/uL — ABNORMAL LOW (ref 3.87–5.11)
RDW: 13.9 % (ref 11.5–15.5)
WBC: 1.2 K/uL — CL (ref 4.0–10.5)

## 2024-01-11 MED ORDER — SPIRONOLACTONE 25 MG PO TABS: 12.5000 mg | ORAL_TABLET | Freq: Every day | ORAL | 0 refills | Status: DC

## 2024-01-11 NOTE — Patient Instructions (Addendum)
 Keep your hematologist  updated with your symptoms since Hb is so low Keep your appointment on 9/2 but if you think you need to be seen sooner let them know  Let's check your cbc today    I do want you to check in with GI   I will do a chart review for this    Try the spironolactone  12.5 mg daily (1/2 of at 25 mg pill)  If any side effects stop it and let us  know   Lab today Cbc Bmet Liver function   Follow up in a week

## 2024-01-11 NOTE — Assessment & Plan Note (Signed)
 In setting of pancreatic cancer  Seen in ER on 8/14  Reviewed hospital records, lab results and studies in detail    Symptoms worsened after last bile duct stent  Some RUQ discomfort/easy satiety and bloating in abdomen Exam is notable for mild ascites  Also some ankle swelling  CT noted cirrhotic liver morphology with portal hypertension  Liver tests were stable at the time/ repeated today Suspect the ascites is causing the pressure and early satiety   Will try very low dose spironolactone  / 12.5 mg to help with fluid Discussed possible side effects incl hypotension and elevated K   Needs GI follow up  Follow up here next wk for more lab and re check

## 2024-01-11 NOTE — Progress Notes (Signed)
 Subjective:    Patient ID: Nicole Johnson, female    DOB: 1960-09-07, 63 y.o.   MRN: [id number]  HPI  Wt Readings from Last 3 Encounters:  01/11/24 146 lb 6 oz (66.4 kg)  10/24/23 137 lb 8 oz (62.4 kg)  10/08/23 135 lb 2.3 oz (61.3 kg)   24.36 kg/m  Vitals:   01/11/24 1021  BP: (!) 106/58  Pulse: 84  Temp: 97.9 F (36.6 C)  SpO2: 100%    Pt presents for follow up ER visit on 8/14   Seen for abd pain  At [facility]  Noted full feeling after eating (bile duct stent placed a mo prior)  Then pain worsened on 8/13 with nausea  Pressure like sensation in RUQ   Did note she had taken more advil recently   Pt has locally advanced unresectable pancreatic cancer (last dhemo rads 05/2022), with prevention ERCP and biliatry stent (most recent 11/2023)  Noted ascites   Total bili was not elevated    From ER note: Upon chart review, she is hemodynamically stable and without fever. Her labs demonstrate stably mildly elevated LFTs (tbili 0.9, ASTS 49, ALT 58, ALP 303). Upon review of her imaging (CTAP and RUQUS obtained in ER today), there is no intrahepatic or extrahepatic biliary ductal dilation on US  and CT scan demonstrates patent biliary stent. Thus, feel her biliary stent is functioning well at this time. Given she is hemodynamically stable and with stable LFTs, no urgent indication for Biliary evaluation of her acute on chronic abdominal pain and would not be able to be evaluated until tomorrow morning in-person due to constraints of our service. Discussed with ER provider that if she is admitted we can evaluate her in the morning, but we defer decision regarding admission to the ER team. Currently see no indication for any advanced biliary endoscopic procedure.   SUMMARY STATEMENT(S)  At the time of discharge patient was pending evaluation by GI biliary team. UA was negative for infection, lipase was 18, creatinine was 0.8 with electrolytes unremarkable. Did have slightly elevated LFTs at  49 AST, 58 ALT, T. bili was 0.9. Hemoglobin was 7.6 which is slightly lower than most recent hemoglobin at 8.1 on 8/1. No rectal bleeding or hematemesis. WBC is 1.3 which is at patient's baseline. CT abdomen pelvis showed no significant abnormalities to the biliary stent on pulmonary read, ultrasound gallbladder showed no significant abnormalities as well.    Spoke with patient extensively about her CT abdomen pelvis with contrast results and about her lab work. Informed patient that her total bilirubin was not elevated and her stent patent on CT abdomen pelvis scan. Patient is complaining of abdominal fullness which she has been experiencing. Advised patient to follow-up with her primary care provider for this ED visit. Patient expressed understanding. Also spoke with the biliary team however, biliary team stated that they do not have any additional recommendations at this time.   Report from CT scan Impression: 1. Cirrhotic liver morphology with portal hypertension including worsening ascites. 2. Marked gastric wall and colonic thickening, favored to reflect worsening portal gastropathy and colopathy, though gastritis and colitis could have similar appearances. 3. Unchanged appearance of locally advanced pancreatic head lesion. The biliary stent is patent and there has been interval decreased size of rim-enhancing lesion within the liver, favored resolving biloma/abscess.   Takes omeprazole  20 mg twice daily  More heartburn lately  Has had to get antacid or alka selzer   She did sent message to  her specialists    Noted all of her problems started after stent  More bloating and increased abdominal size   Gets full easily  Discomfort /pressure -is improved but not gone  Some nausea last night (but did not eat right yesterday)_ thinks it was from snacking on potato chips and pork skins , then a tomato sandwich   Feels some better today  Took a old hydrochlorothiazide and it helped       Next appts Had CT scan/onc on oct 15th  Sees heme/onc on 9/2   Feels close to needing a transfusion     Patient Active Problem List   Diagnosis Date Noted   Ascites 01/11/2024   Portal hypertension (HCC) 10/24/2023   Borderline low blood pressure determined by examination 10/24/2023   Protein-calorie malnutrition, severe 10/11/2023   Acute lower GI bleeding 10/08/2023   Hypothyroidism 10/08/2023   Moderate protein malnutrition (HCC) 10/08/2023   Pancytopenia (HCC) 10/08/2023   Symptomatic anemia 10/08/2023   Pain of left thumb 08/25/2023   Bilateral lower extremity edema 04/08/2023   Thrombocytopenia (HCC) 04/03/2023   Hyponatremia 04/03/2023   Acute cholecystitis 04/03/2023   Genetic testing 01/28/2023   Pancreatic cancer (HCC) 01/06/2023   Jaundice 12/16/2022   Allergic rhinitis 09/09/2022   Mild anemia 02/10/2021   Routine general medical examination at a health care facility 01/01/2021   Muscle cramps 07/10/2020   Current use of proton pump inhibitor 07/10/2020   B12 deficiency 07/10/2020   Prediabetes 12/26/2019   Gastroesophageal reflux disease 12/02/2017   Stress incontinence of urine 07/26/2016   Past Medical History:  Diagnosis Date   Arthritis    Cancer (HCC)    pancreatic   GERD (gastroesophageal reflux disease)    PONV (postoperative nausea and vomiting)    Prediabetes    Past Surgical History:  Procedure Laterality Date   BILIARY BRUSHING  12/17/2022   Procedure: BILIARY BRUSHING;  Surgeon: Rollin Dover, MD;  Location: [facility];  Service: Gastroenterology;;   BILIARY STENT PLACEMENT N/A 12/17/2022   Procedure: BILIARY STENT PLACEMENT;  Surgeon: Rollin Dover, MD;  Location: [facility];  Service: Gastroenterology;  Laterality: N/A;   BILIARY STENT PLACEMENT N/A 04/05/2023   Procedure: BILIARY STENT PLACEMENT;  Surgeon: Rollin Dover, MD;  Location: [facility];  Service: Gastroenterology;  Laterality: N/A;   COLONOSCOPY N/A 10/11/2023    Procedure: COLONOSCOPY;  Surgeon: Rollin Dover, MD;  Location: [facility];  Service: Gastroenterology;  Laterality: N/A;   ERCP N/A 12/17/2022   Procedure: ENDOSCOPIC RETROGRADE CHOLANGIOPANCREATOGRAPHY (ERCP);  Surgeon: Rollin Dover, MD;  Location: [facility];  Service: Gastroenterology;  Laterality: N/A;   ERCP N/A 04/05/2023   Procedure: ENDOSCOPIC RETROGRADE CHOLANGIOPANCREATOGRAPHY (ERCP);  Surgeon: Rollin Dover, MD;  Location: [facility];  Service: Gastroenterology;  Laterality: N/A;   ESOPHAGOGASTRODUODENOSCOPY N/A 10/11/2023   Procedure: EGD (ESOPHAGOGASTRODUODENOSCOPY);  Surgeon: Rollin Dover, MD;  Location: [facility];  Service: Gastroenterology;  Laterality: N/A;   ESOPHAGOGASTRODUODENOSCOPY (EGD) WITH PROPOFOL  N/A 12/18/2022   Procedure: ESOPHAGOGASTRODUODENOSCOPY (EGD) WITH PROPOFOL ;  Surgeon: Rollin Dover, MD;  Location: [facility];  Service: Gastroenterology;  Laterality: N/A;   ESOPHAGOGASTRODUODENOSCOPY (EGD) WITH PROPOFOL  N/A 12/30/2022   Procedure: ESOPHAGOGASTRODUODENOSCOPY (EGD) WITH PROPOFOL ;  Surgeon: Rollin Dover, MD;  Location: [facility];  Service: Gastroenterology;  Laterality: N/A;   EUS N/A 12/18/2022   Procedure: UPPER ENDOSCOPIC ULTRASOUND (EUS) LINEAR;  Surgeon: Rollin Dover, MD;  Location: [facility];  Service: Gastroenterology;  Laterality: N/A;   FINE NEEDLE ASPIRATION N/A 12/18/2022   Procedure: FINE NEEDLE  ASPIRATION (FNA) LINEAR;  Surgeon: Rollin Dover, MD;  Location: [facility];  Service: Gastroenterology;  Laterality: N/A;   FINE NEEDLE ASPIRATION  12/30/2022   Procedure: FINE NEEDLE ASPIRATION (FNA) LINEAR;  Surgeon: Rollin Dover, MD;  Location: [facility];  Service: Gastroenterology;;   LAPAROSCOPIC ABDOMINAL EXPLORATION     SPHINCTEROTOMY  12/17/2022   Procedure: ANNETT;  Surgeon: Rollin Dover, MD;  Location: [facility];  Service: Gastroenterology;;   CLEDA REMOVAL  04/05/2023   Procedure: STENT REMOVAL;  Surgeon: Rollin Dover, MD;  Location: [facility];  Service: Gastroenterology;;   UPPER ESOPHAGEAL ENDOSCOPIC ULTRASOUND (EUS) N/A 12/30/2022   Procedure: UPPER ESOPHAGEAL ENDOSCOPIC ULTRASOUND (EUS);  Surgeon: Rollin Dover, MD;  Location: [facility]COPY;  Service: Gastroenterology;  Laterality: N/A;   Social History   Tobacco Use   Smoking status: Never   Smokeless tobacco: Never  Vaping Use   Vaping status: Never Used  Substance Use Topics   Alcohol  use: Not Currently    Comment: occ   Drug use: Never   Family History  Problem Relation Age of Onset   Diabetes Mother    Stroke Mother    Lung cancer Father 30       smoked   Diabetes Father    Diabetes Sister    Breast cancer Sister 89   Diabetes Brother    Cancer Brother 60       neck cancer   Heart disease Maternal Grandmother    Breast cancer Maternal Grandmother        dx. <50, double mastectomy   Heart disease Maternal Grandfather    Allergies  Allergen Reactions   Other Nausea Only and Other (See Comments)    Certain types of Anesthesia cause SEVERE NAUSEA   Codeine Nausea Only   Hydrocodone -Acetaminophen  Other (See Comments)   Meloxicam Nausea Only   Tramadol Nausea Only   Current Outpatient Medications on File Prior to Visit  Medication Sig Dispense Refill   cetirizine (ZYRTEC) 10 MG chewable tablet Chew 10 mg by mouth daily. As needed     omeprazole  (PRILOSEC) 20 MG capsule Take 1 capsule (20 mg total) by mouth 2 (two) times daily before a meal. 180 capsule 1   Probiotic Product (PROBIOTIC DAILY PO) Take 1 tablet by mouth daily.     No current facility-administered medications on file prior to visit.    Review of Systems  Constitutional:  Positive for fatigue. Negative for activity change, appetite change, fever and unexpected weight change.  HENT:  Negative for congestion, ear pain, rhinorrhea, sinus pressure and sore throat.   Eyes:  Negative for pain, redness and visual disturbance.  Respiratory:  Negative for cough,  shortness of breath and wheezing.   Cardiovascular:  Positive for leg swelling. Negative for chest pain and palpitations.  Gastrointestinal:  Positive for abdominal distention, abdominal pain and nausea. Negative for blood in stool, constipation, diarrhea, rectal pain and vomiting.       Fullness Bloating Easy satiety  Right sided discomfort   Heartburn   Endocrine: Negative for polydipsia and polyuria.  Genitourinary:  Negative for dysuria, frequency and urgency.  Musculoskeletal:  Negative for arthralgias, back pain and myalgias.  Skin:  Negative for pallor and rash.  Allergic/Immunologic: Negative for environmental allergies.  Neurological:  Negative for dizziness, syncope and headaches.  Hematological:  Negative for adenopathy. Does not bruise/bleed easily.  Psychiatric/Behavioral:  Negative for decreased concentration and dysphoric mood. The patient is not nervous/anxious.        Objective:  Physical Exam Constitutional:      General: She is not in acute distress.    Appearance: Normal appearance. She is well-developed and normal weight. She is not ill-appearing or diaphoretic.  HENT:     Head: Normocephalic and atraumatic.  Eyes:     Conjunctiva/sclera: Conjunctivae normal.     Pupils: Pupils are equal, round, and reactive to light.  Neck:     Thyroid : No thyromegaly.     Vascular: No carotid bruit or JVD.  Cardiovascular:     Rate and Rhythm: Normal rate and regular rhythm.     Heart sounds: Normal heart sounds.     No gallop.  Pulmonary:     Effort: Pulmonary effort is normal. No respiratory distress.     Breath sounds: Normal breath sounds. No wheezing or rales.  Abdominal:     General: Abdomen is protuberant. Bowel sounds are normal. There is distension. There is no abdominal bruit.     Palpations: Abdomen is soft. There is no mass or pulsatile mass.     Tenderness: There is abdominal tenderness in the right upper quadrant. There is no right CVA tenderness, left  CVA tenderness, guarding or rebound.     Hernia: No hernia is present.     Comments: Exam is consistent with mild ascites  Mild tenderness in RUQ    Musculoskeletal:     Cervical back: Normal range of motion and neck supple.     Right lower leg: No edema.     Left lower leg: No edema.  Lymphadenopathy:     Cervical: No cervical adenopathy.  Skin:    General: Skin is warm and dry.     Coloration: Skin is not pale.     Findings: No rash.     Comments: Sallow   Neurological:     Mental Status: She is alert.     Motor: No weakness.     Coordination: Coordination normal.     Deep Tendon Reflexes: Reflexes are normal and symmetric. Reflexes normal.  Psychiatric:        Mood and Affect: Mood normal.           Assessment & Plan:   Problem List Items Addressed This Visit       Cardiovascular and Mediastinum   Portal hypertension (HCC)   Relevant Medications   spironolactone  (ALDACTONE ) 25 MG tablet   Other Relevant Orders   Hepatic function panel     Digestive   Pancreatic cancer [facility])   Reviewed onc notes Also recent ER visit  Has felt worse since recent biliary stent  Some ascites -sympatomatic  Low hb = in need of transfusion soon   Lab today  Plan heme and onc follow up       Relevant Orders   Hepatic function panel   Basic metabolic panel with GFR   Gastroesophageal reflux disease   On omeprazole  20 mg bid In setting of pancreatic cancer and ascites  Suspect fluid is producing pressure         Hematopoietic and Hemostatic   Pancytopenia (HCC)   Hemoglobin down to 7.6 in ER /[facility] recently  Per pt - is close to needing transfusion  In setting of pancreatic cancer   Lab today       Relevant Orders   CBC with Differential/Platelet     Other   Symptomatic anemia   Hb was 7.6 in [facility] ER  Needs a transfusion soon Cbc today Some weakness/dizziness  Relevant Orders   CBC with Differential/Platelet   Ascites - Primary   In setting of  pancreatic cancer  Seen in ER on 8/14  Reviewed hospital records, lab results and studies in detail    Symptoms worsened after last bile duct stent  Some RUQ discomfort/easy satiety and bloating in abdomen Exam is notable for mild ascites  Also some ankle swelling  CT noted cirrhotic liver morphology with portal hypertension  Liver tests were stable at the time/ repeated today Suspect the ascites is causing the pressure and early satiety   Will try very low dose spironolactone  / 12.5 mg to help with fluid Discussed possible side effects incl hypotension and elevated K   Needs GI follow up  Follow up here next wk for more lab and re check         Relevant Orders   Hepatic function panel

## 2024-01-11 NOTE — Assessment & Plan Note (Signed)
 Hemoglobin down to 7.6 in ER /[facility] recently  Per pt - is close to needing transfusion  In setting of pancreatic cancer   Lab today

## 2024-01-11 NOTE — Telephone Encounter (Signed)
 Aware Actually stable from [facility]  See result note  Thanks

## 2024-01-11 NOTE — Assessment & Plan Note (Signed)
 Hb was 7.6 in [facility] ER  Needs a transfusion soon Cbc today Some weakness/dizziness

## 2024-01-11 NOTE — Assessment & Plan Note (Signed)
 On omeprazole  20 mg bid In setting of pancreatic cancer and ascites  Suspect fluid is producing pressure

## 2024-01-11 NOTE — Assessment & Plan Note (Signed)
 Reviewed onc notes Also recent ER visit  Has felt worse since recent biliary stent  Some ascites -sympatomatic  Low hb = in need of transfusion soon   Lab today  Plan heme and onc follow up

## 2024-01-11 NOTE — Telephone Encounter (Signed)
 [facility] called with results of   WBC 1.2  Hemoglobin 7.6  Hematocrit 23.8   Information added to lab book. Spoke to Dr. Randeen.

## 2024-01-16 ENCOUNTER — Other Ambulatory Visit (HOSPITAL_COMMUNITY): Payer: Self-pay | Admitting: Gastroenterology

## 2024-01-16 DIAGNOSIS — C250 Malignant neoplasm of head of pancreas: Secondary | ICD-10-CM | POA: Diagnosis not present

## 2024-01-16 DIAGNOSIS — K766 Portal hypertension: Secondary | ICD-10-CM | POA: Diagnosis not present

## 2024-01-16 DIAGNOSIS — R188 Other ascites: Secondary | ICD-10-CM

## 2024-01-17 ENCOUNTER — Ambulatory Visit (HOSPITAL_COMMUNITY)
Admission: RE | Admit: 2024-01-17 | Discharge: 2024-01-17 | Disposition: A | Discharge status: Home / Self Care | Source: Ambulatory Visit | Attending: Gastroenterology | Admitting: Gastroenterology

## 2024-01-17 DIAGNOSIS — C259 Malignant neoplasm of pancreas, unspecified: Secondary | ICD-10-CM | POA: Insufficient documentation

## 2024-01-17 DIAGNOSIS — R180 Malignant ascites: Secondary | ICD-10-CM | POA: Insufficient documentation

## 2024-01-17 DIAGNOSIS — C801 Malignant (primary) neoplasm, unspecified: Secondary | ICD-10-CM | POA: Diagnosis not present

## 2024-01-17 DIAGNOSIS — R188 Other ascites: Secondary | ICD-10-CM | POA: Diagnosis not present

## 2024-01-17 DIAGNOSIS — K7460 Unspecified cirrhosis of liver: Secondary | ICD-10-CM | POA: Diagnosis not present

## 2024-01-17 HISTORY — PX: IR PARACENTESIS: IMG2679

## 2024-01-17 LAB — BODY FLUID CELL COUNT WITH DIFFERENTIAL
Eos, Fluid: 0 %
Lymphs, Fluid: 32 %
Monocyte-Macrophage-Serous Fluid: 13 % — ABNORMAL LOW (ref 50–90)
Neutrophil Count, Fluid: 55 % — ABNORMAL HIGH (ref 0–25)
Total Nucleated Cell Count, Fluid: 368 uL (ref 0–1000)

## 2024-01-17 LAB — ALBUMIN, PLEURAL OR PERITONEAL FLUID: Albumin, Fluid: <1.5 g/dL

## 2024-01-17 LAB — PROTEIN, PLEURAL OR PERITONEAL FLUID: Total protein, fluid: <3 g/dL

## 2024-01-17 MED ORDER — LIDOCAINE HCL 1 % IJ SOLN
INTRAMUSCULAR | Status: AC
  Filled 2024-01-17: qty 20

## 2024-01-17 MED ORDER — LIDOCAINE HCL 1 % IJ SOLN
20.0000 mL | Freq: Once | INTRAMUSCULAR | Status: AC
  Administered 2024-01-17: 10 mL

## 2024-01-17 NOTE — Procedures (Signed)
 PROCEDURE SUMMARY:  Successful US  guided paracentesis from right lateral abdomen.  Yielded 2.1 liters of clear, yellow fluid.  No immediate complications.  Pt tolerated well.   Specimen was sent for labs.  EBL < 5mL  Solmon Selmer Ku PA-C 01/17/2024 1:11 PM

## 2024-01-18 ENCOUNTER — Encounter: Payer: Self-pay | Admitting: Family Medicine

## 2024-01-18 ENCOUNTER — Telehealth: Payer: Self-pay

## 2024-01-18 ENCOUNTER — Ambulatory Visit: Payer: Self-pay | Admitting: Family Medicine

## 2024-01-18 ENCOUNTER — Ambulatory Visit (INDEPENDENT_AMBULATORY_CARE_PROVIDER_SITE_OTHER): Admitting: Family Medicine

## 2024-01-18 VITALS — BP 96/62 | HR 81 | Temp 98.1°F | Ht 65.0 in | Wt 138.2 lb

## 2024-01-18 DIAGNOSIS — D61818 Other pancytopenia: Secondary | ICD-10-CM | POA: Diagnosis not present

## 2024-01-18 DIAGNOSIS — R188 Other ascites: Secondary | ICD-10-CM

## 2024-01-18 DIAGNOSIS — R031 Nonspecific low blood-pressure reading: Secondary | ICD-10-CM

## 2024-01-18 LAB — BASIC METABOLIC PANEL WITH GFR
BUN: 15 mg/dL (ref 6–23)
CO2: 29 meq/L (ref 19–32)
Calcium: 8.4 mg/dL (ref 8.4–10.5)
Chloride: 102 meq/L (ref 96–112)
Creatinine, Ser: 0.92 mg/dL (ref 0.40–1.20)
GFR: 66.88 mL/min (ref >60.00)
Glucose, Bld: 120 mg/dL — ABNORMAL HIGH (ref 70–99)
Potassium: 4 meq/L (ref 3.5–5.1)
Sodium: 137 meq/L (ref 135–145)

## 2024-01-18 LAB — CBC WITH DIFFERENTIAL/PLATELET
Basophils Absolute: 0 K/uL (ref 0.0–0.1)
Basophils Relative: 0.4 % (ref 0.0–3.0)
Eosinophils Absolute: 0 K/uL (ref 0.0–0.7)
Eosinophils Relative: 2 % (ref 0.0–5.0)
HCT: 24.5 % — ABNORMAL LOW (ref 36.0–46.0)
Hemoglobin: 7.8 g/dL — CL (ref 12.0–15.0)
Lymphocytes Relative: 13.5 % (ref 12.0–46.0)
Lymphs Abs: 0.2 K/uL — ABNORMAL LOW (ref 0.7–4.0)
MCHC: 31.9 g/dL (ref 30.0–36.0)
MCV: 85.8 fl (ref 78.0–100.0)
Monocytes Absolute: 0.1 K/uL (ref 0.1–1.0)
Monocytes Relative: 11 % (ref 3.0–12.0)
Neutro Abs: 0.9 K/uL — ABNORMAL LOW (ref 1.4–7.7)
Neutrophils Relative %: 73.1 % (ref 43.0–77.0)
Platelets: 100 K/uL — ABNORMAL LOW (ref 150.0–400.0)
RBC: 2.86 Mil/uL — ABNORMAL LOW (ref 3.87–5.11)
RDW: 14.2 % (ref 11.5–15.5)
WBC: 1.3 K/uL — CL (ref 4.0–10.5)

## 2024-01-18 MED ORDER — SPIRONOLACTONE 25 MG PO TABS: 12.5000 mg | ORAL_TABLET | Freq: Every day | ORAL | 2 refills | Status: DC

## 2024-01-18 NOTE — Telephone Encounter (Signed)
  CRITICAL VALUE:hgb 7.8 and WBC 1.3  RECEIVER (on-site recipient of call):Avian Greenawalt LPN  DATE & TIME NOTIFIED: 3:24 PM 01/18/24   MESSENGER (representative from lab):Karen with [facility] lab  MD NOTIFIED: Dr. Laine Balls  TIME OF NOTIFICATION:3:24 PM  RESPONSE:

## 2024-01-18 NOTE — Patient Instructions (Signed)
 Labs today  We will update you with results and plan  Continue the spironolactone  for now   If you get more light headed let us  know   Follow up with GI as planned based on your results

## 2024-01-18 NOTE — Assessment & Plan Note (Addendum)
 Last hb 7.6-not lower then ER visit prior  Reviewed last hematology note  Has hematology appointment next Tuesday   Cbc today Is feeling more tired and will need transfusion/infusion soon

## 2024-01-18 NOTE — Assessment & Plan Note (Addendum)
  Did see GI since last visit  Office notes from Dr Rollin reviewed today (labs pending) 5 lb fluid was tapped from abd and studies are pending  Symptoms are improved Pt thinks spironolactone  is also helping  12.5 mg daily   Lab today

## 2024-01-18 NOTE — Assessment & Plan Note (Signed)
 BP: 96/62  Pt does not feel more light headed Tolerating the aldactone  so far 12.5 mg (for ascites and pedal edema) Labs today  She will continue to monitor her bp

## 2024-01-18 NOTE — Progress Notes (Signed)
 Subjective:    Patient ID: Nicole Johnson, female    DOB: Jun 02, 1960, 63 y.o.   MRN: [id number]  HPI  Wt Readings from Last 3 Encounters:  01/18/24 138 lb 4 oz (62.7 kg)  01/11/24 146 lb 6 oz (66.4 kg)  10/24/23 137 lb 8 oz (62.4 kg)   23.01 kg/m  Vitals:   01/18/24 1028  BP: 96/62  Pulse: 81  Temp: 98.1 F (36.7 C)  SpO2: 100%    Pt presents for follow up of ascites in setting of pancreatic cancer  Also anemia /pancytopenia    Last visit noted increase in abd bloating and discomfort  Esp RUQ Noted mild ascites on imaging / CT and portal hypertension  Dr Rollin   Per his note/assessment:   Jordain has a complex case. Since the new stent placement she complained about having abdominal pain and early satiety. Imaging showed ascites and a nodular hepatic contour consistent with cirrhosis, however, a prior scan did show significant portal compression from the mass. This can result in splenomegaly, but not ascites. Further evaluation of the ascitic fluid is required to determine the source of her ascites. It does not appear to be malignant at this time. Interestingly she states that her ascites tends to come and go. The ascitic fluid will be checked for cell count, albumin, total protein, and cytology. It is anticipated that she will be managed with diuretics.   Per pt had 5 lb of fluid removed yesterday Had soreness yesterday  Today improved overall  Is less bloated   Thinks the spironolactone  helped a little before that   Feels a little tightness in ankles    Stools are more greasy and orange May have to start creon again  Watches diet for fat   BP Readings from Last 3 Encounters:  01/18/24 96/62  01/17/24 100/70  01/11/24 (!) 106/58   Not urinating a lot more / but some more than usual  Has not noticed a big difference in blood pressure   Blood sugar has been up a bit more than usual Not on any steroids   More tired  No urinary symptoms  .  Lab Results   Component Value Date   ALT 55 (H) 01/11/2024   AST 36 01/11/2024   ALKPHOS 303 (H) 01/11/2024   BILITOT 0.3 01/11/2024   Lab Results  Component Value Date   NA 136 01/11/2024   K 3.9 01/11/2024   CO2 31 01/11/2024   GLUCOSE 124 (H) 01/11/2024   BUN 12 01/11/2024   CREATININE 0.81 01/11/2024   CALCIUM 8.4 01/11/2024   GFR 77.93 01/11/2024   GFRNONAA >60 10/12/2023   We started some spironolactone  for the fluid 12.5 mg daily  In the interim pt did see GI   Anemia /pancytopenia Lab Results  Component Value Date   WBC 1.2 (LL) 01/11/2024   HGB 7.6 (LL) 01/11/2024   HCT 23.8 (LL) 01/11/2024   MCV 86.3 01/11/2024   PLT 100.0 (L) 01/11/2024   Had heme follow up planned 9/2  This was overall fairly stable  Will need transfusion     Patient Active Problem List   Diagnosis Date Noted   Ascites 01/11/2024   Portal hypertension (HCC) 10/24/2023   Borderline low blood pressure determined by examination 10/24/2023   Protein-calorie malnutrition, severe 10/11/2023   Acute lower GI bleeding 10/08/2023   Hypothyroidism 10/08/2023   Moderate protein malnutrition (HCC) 10/08/2023   Pancytopenia (HCC) 10/08/2023   Symptomatic anemia 10/08/2023  Pain of left thumb 08/25/2023   Bilateral lower extremity edema 04/08/2023   Thrombocytopenia (HCC) 04/03/2023   Hyponatremia 04/03/2023   Acute cholecystitis 04/03/2023   Genetic testing 01/28/2023   Pancreatic cancer (HCC) 01/06/2023   Jaundice 12/16/2022   Allergic rhinitis 09/09/2022   Mild anemia 02/10/2021   Routine general medical examination at a health care facility 01/01/2021   Muscle cramps 07/10/2020   Current use of proton pump inhibitor 07/10/2020   B12 deficiency 07/10/2020   Prediabetes 12/26/2019   Gastroesophageal reflux disease 12/02/2017   Stress incontinence of urine 07/26/2016   Past Medical History:  Diagnosis Date   Arthritis    Cancer (HCC)    pancreatic   GERD (gastroesophageal reflux disease)     PONV (postoperative nausea and vomiting)    Prediabetes    Past Surgical History:  Procedure Laterality Date   BILIARY BRUSHING  12/17/2022   Procedure: BILIARY BRUSHING;  Surgeon: Rollin Dover, MD;  Location: [facility];  Service: Gastroenterology;;   BILIARY STENT PLACEMENT N/A 12/17/2022   Procedure: BILIARY STENT PLACEMENT;  Surgeon: Rollin Dover, MD;  Location: [facility];  Service: Gastroenterology;  Laterality: N/A;   BILIARY STENT PLACEMENT N/A 04/05/2023   Procedure: BILIARY STENT PLACEMENT;  Surgeon: Rollin Dover, MD;  Location: [facility];  Service: Gastroenterology;  Laterality: N/A;   COLONOSCOPY N/A 10/11/2023   Procedure: COLONOSCOPY;  Surgeon: Rollin Dover, MD;  Location: [facility];  Service: Gastroenterology;  Laterality: N/A;   ERCP N/A 12/17/2022   Procedure: ENDOSCOPIC RETROGRADE CHOLANGIOPANCREATOGRAPHY (ERCP);  Surgeon: Rollin Dover, MD;  Location: [facility];  Service: Gastroenterology;  Laterality: N/A;   ERCP N/A 04/05/2023   Procedure: ENDOSCOPIC RETROGRADE CHOLANGIOPANCREATOGRAPHY (ERCP);  Surgeon: Rollin Dover, MD;  Location: [facility];  Service: Gastroenterology;  Laterality: N/A;   ESOPHAGOGASTRODUODENOSCOPY N/A 10/11/2023   Procedure: EGD (ESOPHAGOGASTRODUODENOSCOPY);  Surgeon: Rollin Dover, MD;  Location: [facility];  Service: Gastroenterology;  Laterality: N/A;   ESOPHAGOGASTRODUODENOSCOPY (EGD) WITH PROPOFOL  N/A 12/18/2022   Procedure: ESOPHAGOGASTRODUODENOSCOPY (EGD) WITH PROPOFOL ;  Surgeon: Rollin Dover, MD;  Location: [facility];  Service: Gastroenterology;  Laterality: N/A;   ESOPHAGOGASTRODUODENOSCOPY (EGD) WITH PROPOFOL  N/A 12/30/2022   Procedure: ESOPHAGOGASTRODUODENOSCOPY (EGD) WITH PROPOFOL ;  Surgeon: Rollin Dover, MD;  Location[facility];  Service: Gastroenterology;  Laterality: N/A;   EUS N/A 12/18/2022   Procedure: UPPER ENDOSCOPIC ULTRASOUND (EUS) LINEAR;  Surgeon: Rollin Dover, MD;  Location: [facility];  Service:  Gastroenterology;  Laterality: N/A;   FINE NEEDLE ASPIRATION N/A 12/18/2022   Procedure: FINE NEEDLE ASPIRATION (FNA) LINEAR;  Surgeon: Rollin Dover, MD;  Location: [facility];  Service: Gastroenterology;  Laterality: N/A;   FINE NEEDLE ASPIRATION  12/30/2022   Procedure: FINE NEEDLE ASPIRATION (FNA) LINEAR;  Surgeon: Rollin Dover, MD;  Location[facility];  Service: Gastroenterology;;   IR PARACENTESIS  01/17/2024   LAPAROSCOPIC ABDOMINAL EXPLORATION     SPHINCTEROTOMY  12/17/2022   Procedure: ANNETT;  Surgeon: Rollin Dover, MD;  Location: [facility];  Service: Gastroenterology;;   CLEDA REMOVAL  04/05/2023   Procedure: STENT REMOVAL;  Surgeon: Rollin Dover, MD;  Location: [facility];  Service: Gastroenterology;;   UPPER ESOPHAGEAL ENDOSCOPIC ULTRASOUND (EUS) N/A 12/30/2022   Procedure: UPPER ESOPHAGEAL ENDOSCOPIC ULTRASOUND (EUS);  Surgeon: Rollin Dover, MD;  Location: [facility] END[facility]COPY;  Service: Gastroenterology;  Laterality: N/A;   Social History   Tobacco Use   Smoking status: Never   Smokeless tobacco: Never  Vaping Use   Vaping status: Never Used  Substance Use Topics   Alcohol  use:  Not Currently    Comment: occ   Drug use: Never   Family History  Problem Relation Age of Onset   Diabetes Mother    Stroke Mother    Lung cancer Father 33       smoked   Diabetes Father    Diabetes Sister    Breast cancer Sister 17   Diabetes Brother    Cancer Brother 60       neck cancer   Heart disease Maternal Grandmother    Breast cancer Maternal Grandmother        dx. <50, double mastectomy   Heart disease Maternal Grandfather    Allergies  Allergen Reactions   Other Nausea Only and Other (See Comments)    Certain types of Anesthesia cause SEVERE NAUSEA   Codeine Nausea Only   Hydrocodone -Acetaminophen  Other (See Comments)   Meloxicam Nausea Only   Tramadol Nausea Only   Current Outpatient Medications on File Prior to Visit  Medication Sig Dispense Refill    cetirizine (ZYRTEC) 10 MG chewable tablet Chew 10 mg by mouth daily. As needed     omeprazole  (PRILOSEC) 20 MG capsule Take 1 capsule (20 mg total) by mouth 2 (two) times daily before a meal. 180 capsule 1   Probiotic Product (PROBIOTIC DAILY PO) Take 1 tablet by mouth daily.     spironolactone  (ALDACTONE ) 25 MG tablet Take 0.5 tablets (12.5 mg total) by mouth daily. 30 tablet 0   No current facility-administered medications on file prior to visit.    Review of Systems  Constitutional:  Positive for fatigue. Negative for activity change, appetite change, fever and unexpected weight change.  HENT:  Negative for congestion, ear pain, rhinorrhea, sinus pressure and sore throat.   Eyes:  Negative for pain, redness and visual disturbance.  Respiratory:  Negative for cough, shortness of breath and wheezing.   Cardiovascular:  Negative for chest pain and palpitations.  Gastrointestinal:  Positive for abdominal distention and abdominal pain. Negative for blood in stool, constipation, diarrhea and nausea.       Stools are more greasy recently  Endocrine: Negative for polydipsia and polyuria.  Genitourinary:  Negative for dysuria, frequency and urgency.  Musculoskeletal:  Negative for arthralgias, back pain and myalgias.  Skin:  Negative for pallor and rash.  Allergic/Immunologic: Negative for environmental allergies.  Neurological:  Positive for light-headedness. Negative for dizziness, syncope and headaches.       Light headedness is stable  Hematological:  Negative for adenopathy. Does not bruise/bleed easily.  Psychiatric/Behavioral:  Negative for decreased concentration and dysphoric mood. The patient is not nervous/anxious.        Objective:   Physical Exam Constitutional:      General: She is not in acute distress.    Appearance: Normal appearance. She is well-developed and normal weight. She is not ill-appearing or diaphoretic.  HENT:     Head: Normocephalic and atraumatic.  Eyes:      Conjunctiva/sclera: Conjunctivae normal.     Pupils: Pupils are equal, round, and reactive to light.  Neck:     Thyroid : No thyromegaly.     Vascular: No carotid bruit or JVD.  Cardiovascular:     Rate and Rhythm: Normal rate and regular rhythm.     Heart sounds: Normal heart sounds.     No gallop.  Pulmonary:     Effort: Pulmonary effort is normal. No respiratory distress.     Breath sounds: Normal breath sounds. No wheezing or rales.  Abdominal:  General: Bowel sounds are normal. There is no distension or abdominal bruit.     Palpations: Abdomen is soft. There is no fluid wave, mass or pulsatile mass.     Tenderness: There is no abdominal tenderness. There is no right CVA tenderness, left CVA tenderness, guarding or rebound.     Comments: Abdomen is less distended today  Musculoskeletal:     Cervical back: Normal range of motion and neck supple.     Right lower leg: Edema present.     Left lower leg: Edema present.     Comments: Trace pedal edema bilaterally   Lymphadenopathy:     Cervical: No cervical adenopathy.  Skin:    General: Skin is warm and dry.     Coloration: Skin is not pale.     Findings: No rash.     Comments: Sallow complexion   Neurological:     Mental Status: She is alert.     Coordination: Coordination normal.     Deep Tendon Reflexes: Reflexes are normal and symmetric. Reflexes normal.  Psychiatric:        Mood and Affect: Mood normal.           Assessment & Plan:   Problem List Items Addressed This Visit       Cardiovascular and Mediastinum   Borderline low blood pressure determined by examination - Primary   BP: 96/62  Pt does not feel more light headed Tolerating the aldactone  so far 12.5 mg (for ascites and pedal edema) Labs today  She will continue to monitor her bp        Hematopoietic and Hemostatic   Pancytopenia (HCC)   Last hb 7.6-not lower then ER visit prior  Reviewed last hematology note  Has hematology appointment  next Tuesday   Cbc today Is feeling more tired and will need transfusion/infusion soon       Relevant Orders   CBC with Differential/Platelet     Other   Ascites    Did see GI since last visit  Office notes from Dr Rollin reviewed today (labs pending) 5 lb fluid was tapped from abd and studies are pending  Symptoms are improved Pt thinks spironolactone  is also helping  12.5 mg daily   Lab today      Relevant Orders   Basic metabolic panel with GFR

## 2024-01-18 NOTE — Telephone Encounter (Signed)
 Aware Actually stable  See result note

## 2024-01-24 DIAGNOSIS — D61818 Other pancytopenia: Secondary | ICD-10-CM | POA: Diagnosis not present

## 2024-01-24 DIAGNOSIS — C250 Malignant neoplasm of head of pancreas: Secondary | ICD-10-CM | POA: Diagnosis not present

## 2024-01-25 LAB — CYTOLOGY - NON PAP

## 2024-02-07 NOTE — Addendum Note (Signed)
 Encounter addended by: Cozetta Seif H on: 02/07/2024 9:59 AM  Actions taken: Charge Capture section accepted

## 2024-02-08 DIAGNOSIS — D649 Anemia, unspecified: Secondary | ICD-10-CM | POA: Diagnosis not present

## 2024-02-08 DIAGNOSIS — C250 Malignant neoplasm of head of pancreas: Secondary | ICD-10-CM | POA: Diagnosis not present

## 2024-02-08 DIAGNOSIS — D61818 Other pancytopenia: Secondary | ICD-10-CM | POA: Diagnosis not present

## 2024-02-08 DIAGNOSIS — Z923 Personal history of irradiation: Secondary | ICD-10-CM | POA: Diagnosis not present

## 2024-02-08 DIAGNOSIS — Z87891 Personal history of nicotine dependence: Secondary | ICD-10-CM | POA: Diagnosis not present

## 2024-02-08 DIAGNOSIS — R188 Other ascites: Secondary | ICD-10-CM | POA: Diagnosis not present

## 2024-02-09 ENCOUNTER — Other Ambulatory Visit: Payer: Self-pay | Admitting: Hematology

## 2024-02-09 ENCOUNTER — Encounter: Payer: Self-pay | Admitting: *Deleted

## 2024-02-09 ENCOUNTER — Other Ambulatory Visit: Payer: Self-pay

## 2024-02-09 DIAGNOSIS — C250 Malignant neoplasm of head of pancreas: Secondary | ICD-10-CM

## 2024-02-13 DIAGNOSIS — C250 Malignant neoplasm of head of pancreas: Secondary | ICD-10-CM | POA: Diagnosis not present

## 2024-02-13 DIAGNOSIS — K766 Portal hypertension: Secondary | ICD-10-CM | POA: Diagnosis not present

## 2024-02-13 DIAGNOSIS — R188 Other ascites: Secondary | ICD-10-CM | POA: Diagnosis not present

## 2024-02-14 ENCOUNTER — Ambulatory Visit (INDEPENDENT_AMBULATORY_CARE_PROVIDER_SITE_OTHER)

## 2024-02-14 ENCOUNTER — Other Ambulatory Visit (HOSPITAL_COMMUNITY): Payer: Self-pay | Admitting: Gastroenterology

## 2024-02-14 DIAGNOSIS — R188 Other ascites: Secondary | ICD-10-CM

## 2024-02-14 DIAGNOSIS — Z23 Encounter for immunization: Secondary | ICD-10-CM | POA: Diagnosis not present

## 2024-02-14 NOTE — Progress Notes (Signed)
 Per orders of Dr. Laine Balls, injection of flu vaccine given by Bobbette Sprague in left deltoid. Patient tolerated injection well.

## 2024-02-14 NOTE — Addendum Note (Signed)
 Encounter addended by: Loic Hobin H on: 02/14/2024 7:56 AM  Actions taken: Imaging Exam ended, Charge Capture section accepted

## 2024-02-15 ENCOUNTER — Ambulatory Visit (HOSPITAL_COMMUNITY)
Admission: RE | Admit: 2024-02-15 | Discharge: 2024-02-15 | Disposition: A | Discharge status: Home / Self Care | Source: Ambulatory Visit | Attending: Gastroenterology | Admitting: Gastroenterology

## 2024-02-15 DIAGNOSIS — R188 Other ascites: Secondary | ICD-10-CM | POA: Insufficient documentation

## 2024-02-15 HISTORY — PX: IR PARACENTESIS: IMG2679

## 2024-02-15 MED ORDER — LIDOCAINE-EPINEPHRINE 1 %-1:100000 IJ SOLN
INTRAMUSCULAR | Status: AC
  Filled 2024-02-15: qty 1

## 2024-02-15 MED ORDER — LIDOCAINE HCL 1 % IJ SOLN: 20.0000 mL | Freq: Once | INTRAMUSCULAR | Status: DC

## 2024-02-15 NOTE — Procedures (Signed)
Ultrasound-guided therapeutic paracentesis performed yielding 3.7 liters of straw colored fluid.. No immediate complications. EBL is none.  

## 2024-02-20 DIAGNOSIS — R188 Other ascites: Secondary | ICD-10-CM | POA: Diagnosis not present

## 2024-02-21 DIAGNOSIS — C250 Malignant neoplasm of head of pancreas: Secondary | ICD-10-CM | POA: Diagnosis not present

## 2024-02-21 DIAGNOSIS — K3189 Other diseases of stomach and duodenum: Secondary | ICD-10-CM | POA: Diagnosis not present

## 2024-02-21 DIAGNOSIS — R188 Other ascites: Secondary | ICD-10-CM | POA: Diagnosis not present

## 2024-02-21 DIAGNOSIS — K769 Liver disease, unspecified: Secondary | ICD-10-CM | POA: Diagnosis not present

## 2024-02-21 DIAGNOSIS — K668 Other specified disorders of peritoneum: Secondary | ICD-10-CM | POA: Diagnosis not present

## 2024-02-21 DIAGNOSIS — K766 Portal hypertension: Secondary | ICD-10-CM | POA: Diagnosis not present

## 2024-02-22 DIAGNOSIS — Z5111 Encounter for antineoplastic chemotherapy: Secondary | ICD-10-CM | POA: Diagnosis not present

## 2024-02-22 DIAGNOSIS — M549 Dorsalgia, unspecified: Secondary | ICD-10-CM | POA: Diagnosis not present

## 2024-02-22 DIAGNOSIS — D649 Anemia, unspecified: Secondary | ICD-10-CM | POA: Diagnosis not present

## 2024-02-22 DIAGNOSIS — D709 Neutropenia, unspecified: Secondary | ICD-10-CM | POA: Diagnosis not present

## 2024-02-22 DIAGNOSIS — Z87891 Personal history of nicotine dependence: Secondary | ICD-10-CM | POA: Diagnosis not present

## 2024-02-22 DIAGNOSIS — D696 Thrombocytopenia, unspecified: Secondary | ICD-10-CM | POA: Diagnosis not present

## 2024-02-22 DIAGNOSIS — C250 Malignant neoplasm of head of pancreas: Secondary | ICD-10-CM | POA: Diagnosis not present

## 2024-02-22 DIAGNOSIS — C7989 Secondary malignant neoplasm of other specified sites: Secondary | ICD-10-CM | POA: Diagnosis not present

## 2024-02-22 DIAGNOSIS — D61818 Other pancytopenia: Secondary | ICD-10-CM | POA: Diagnosis not present

## 2024-02-29 DIAGNOSIS — D696 Thrombocytopenia, unspecified: Secondary | ICD-10-CM | POA: Diagnosis not present

## 2024-02-29 DIAGNOSIS — C250 Malignant neoplasm of head of pancreas: Secondary | ICD-10-CM | POA: Diagnosis not present

## 2024-03-07 DIAGNOSIS — C250 Malignant neoplasm of head of pancreas: Secondary | ICD-10-CM | POA: Diagnosis not present

## 2024-03-07 DIAGNOSIS — Z87891 Personal history of nicotine dependence: Secondary | ICD-10-CM | POA: Diagnosis not present

## 2024-03-07 DIAGNOSIS — Z5111 Encounter for antineoplastic chemotherapy: Secondary | ICD-10-CM | POA: Diagnosis not present

## 2024-03-07 DIAGNOSIS — D696 Thrombocytopenia, unspecified: Secondary | ICD-10-CM | POA: Diagnosis not present

## 2024-03-07 DIAGNOSIS — D709 Neutropenia, unspecified: Secondary | ICD-10-CM | POA: Diagnosis not present

## 2024-03-07 DIAGNOSIS — M549 Dorsalgia, unspecified: Secondary | ICD-10-CM | POA: Diagnosis not present

## 2024-03-07 DIAGNOSIS — R188 Other ascites: Secondary | ICD-10-CM | POA: Diagnosis not present

## 2024-03-07 DIAGNOSIS — D61818 Other pancytopenia: Secondary | ICD-10-CM | POA: Diagnosis not present

## 2024-03-07 DIAGNOSIS — D649 Anemia, unspecified: Secondary | ICD-10-CM | POA: Diagnosis not present

## 2024-03-09 ENCOUNTER — Other Ambulatory Visit (HOSPITAL_COMMUNITY): Payer: Self-pay | Admitting: Gastroenterology

## 2024-03-09 ENCOUNTER — Encounter: Payer: Self-pay | Admitting: Nephrology

## 2024-03-09 DIAGNOSIS — R188 Other ascites: Secondary | ICD-10-CM

## 2024-03-14 ENCOUNTER — Ambulatory Visit (HOSPITAL_COMMUNITY)
Admission: RE | Admit: 2024-03-14 | Discharge: 2024-03-14 | Disposition: A | Discharge status: Home / Self Care | Source: Ambulatory Visit | Attending: Gastroenterology | Admitting: Gastroenterology

## 2024-03-14 DIAGNOSIS — C250 Malignant neoplasm of head of pancreas: Secondary | ICD-10-CM | POA: Diagnosis not present

## 2024-03-14 DIAGNOSIS — Z8249 Family history of ischemic heart disease and other diseases of the circulatory system: Secondary | ICD-10-CM | POA: Diagnosis not present

## 2024-03-14 DIAGNOSIS — R180 Malignant ascites: Secondary | ICD-10-CM | POA: Diagnosis present

## 2024-03-14 DIAGNOSIS — R935 Abnormal findings on diagnostic imaging of other abdominal regions, including retroperitoneum: Secondary | ICD-10-CM | POA: Diagnosis not present

## 2024-03-14 DIAGNOSIS — R7881 Bacteremia: Secondary | ICD-10-CM | POA: Diagnosis present

## 2024-03-14 DIAGNOSIS — R0989 Other specified symptoms and signs involving the circulatory and respiratory systems: Secondary | ICD-10-CM | POA: Diagnosis not present

## 2024-03-14 DIAGNOSIS — Z1152 Encounter for screening for COVID-19: Secondary | ICD-10-CM | POA: Diagnosis not present

## 2024-03-14 DIAGNOSIS — G893 Neoplasm related pain (acute) (chronic): Secondary | ICD-10-CM | POA: Diagnosis present

## 2024-03-14 DIAGNOSIS — K529 Noninfective gastroenteritis and colitis, unspecified: Secondary | ICD-10-CM | POA: Diagnosis not present

## 2024-03-14 DIAGNOSIS — R161 Splenomegaly, not elsewhere classified: Secondary | ICD-10-CM | POA: Diagnosis not present

## 2024-03-14 DIAGNOSIS — R188 Other ascites: Secondary | ICD-10-CM

## 2024-03-14 DIAGNOSIS — E871 Hypo-osmolality and hyponatremia: Secondary | ICD-10-CM | POA: Diagnosis present

## 2024-03-14 DIAGNOSIS — K750 Abscess of liver: Secondary | ICD-10-CM | POA: Diagnosis present

## 2024-03-14 DIAGNOSIS — B9689 Other specified bacterial agents as the cause of diseases classified elsewhere: Secondary | ICD-10-CM | POA: Diagnosis present

## 2024-03-14 DIAGNOSIS — Z885 Allergy status to narcotic agent status: Secondary | ICD-10-CM | POA: Diagnosis not present

## 2024-03-14 DIAGNOSIS — Z803 Family history of malignant neoplasm of breast: Secondary | ICD-10-CM | POA: Diagnosis not present

## 2024-03-14 DIAGNOSIS — Z801 Family history of malignant neoplasm of trachea, bronchus and lung: Secondary | ICD-10-CM | POA: Diagnosis not present

## 2024-03-14 DIAGNOSIS — K219 Gastro-esophageal reflux disease without esophagitis: Secondary | ICD-10-CM | POA: Diagnosis present

## 2024-03-14 DIAGNOSIS — E876 Hypokalemia: Secondary | ICD-10-CM | POA: Diagnosis present

## 2024-03-14 DIAGNOSIS — R7401 Elevation of levels of liver transaminase levels: Secondary | ICD-10-CM | POA: Diagnosis not present

## 2024-03-14 DIAGNOSIS — R531 Weakness: Secondary | ICD-10-CM | POA: Diagnosis not present

## 2024-03-14 DIAGNOSIS — K769 Liver disease, unspecified: Secondary | ICD-10-CM | POA: Diagnosis not present

## 2024-03-14 DIAGNOSIS — I700 Atherosclerosis of aorta: Secondary | ICD-10-CM | POA: Diagnosis present

## 2024-03-14 DIAGNOSIS — D61810 Antineoplastic chemotherapy induced pancytopenia: Secondary | ICD-10-CM | POA: Diagnosis present

## 2024-03-14 DIAGNOSIS — Z823 Family history of stroke: Secondary | ICD-10-CM | POA: Diagnosis not present

## 2024-03-14 DIAGNOSIS — K7460 Unspecified cirrhosis of liver: Secondary | ICD-10-CM | POA: Diagnosis present

## 2024-03-14 DIAGNOSIS — K8689 Other specified diseases of pancreas: Secondary | ICD-10-CM | POA: Diagnosis not present

## 2024-03-14 DIAGNOSIS — R9389 Abnormal findings on diagnostic imaging of other specified body structures: Secondary | ICD-10-CM | POA: Diagnosis not present

## 2024-03-14 DIAGNOSIS — Z85118 Personal history of other malignant neoplasm of bronchus and lung: Secondary | ICD-10-CM | POA: Diagnosis not present

## 2024-03-14 DIAGNOSIS — Z833 Family history of diabetes mellitus: Secondary | ICD-10-CM | POA: Diagnosis not present

## 2024-03-14 DIAGNOSIS — K759 Inflammatory liver disease, unspecified: Secondary | ICD-10-CM | POA: Diagnosis not present

## 2024-03-14 DIAGNOSIS — C259 Malignant neoplasm of pancreas, unspecified: Secondary | ICD-10-CM | POA: Diagnosis present

## 2024-03-14 DIAGNOSIS — T451X5A Adverse effect of antineoplastic and immunosuppressive drugs, initial encounter: Secondary | ICD-10-CM | POA: Diagnosis present

## 2024-03-14 DIAGNOSIS — D696 Thrombocytopenia, unspecified: Secondary | ICD-10-CM | POA: Diagnosis not present

## 2024-03-14 DIAGNOSIS — Z808 Family history of malignant neoplasm of other organs or systems: Secondary | ICD-10-CM | POA: Diagnosis not present

## 2024-03-14 DIAGNOSIS — K766 Portal hypertension: Secondary | ICD-10-CM | POA: Diagnosis not present

## 2024-03-14 DIAGNOSIS — R932 Abnormal findings on diagnostic imaging of liver and biliary tract: Secondary | ICD-10-CM | POA: Diagnosis not present

## 2024-03-14 DIAGNOSIS — Z888 Allergy status to other drugs, medicaments and biological substances status: Secondary | ICD-10-CM | POA: Diagnosis not present

## 2024-03-14 DIAGNOSIS — C787 Secondary malignant neoplasm of liver and intrahepatic bile duct: Secondary | ICD-10-CM | POA: Diagnosis present

## 2024-03-14 MED ORDER — LIDOCAINE-EPINEPHRINE (PF) 2 %-1:200000 IJ SOLN
INTRAMUSCULAR | Status: AC
Start: 2024-03-14 — End: 2024-03-14
  Filled 2024-03-14: qty 20

## 2024-03-14 NOTE — Procedures (Signed)
Ultrasound-guided  therapeutic paracentesis performed yielding 3 liters of yellow fluid. No immediate complications. EBL none.   

## 2024-03-15 ENCOUNTER — Emergency Department (HOSPITAL_COMMUNITY)

## 2024-03-15 ENCOUNTER — Inpatient Hospital Stay (HOSPITAL_COMMUNITY)
Admission: EM | Admit: 2024-03-15 | Discharge: 2024-03-19 | DRG: 435 | Disposition: A | Discharge status: Home / Self Care | Attending: Internal Medicine | Admitting: Internal Medicine

## 2024-03-15 ENCOUNTER — Encounter (HOSPITAL_COMMUNITY): Payer: Self-pay | Admitting: Emergency Medicine

## 2024-03-15 ENCOUNTER — Other Ambulatory Visit: Payer: Self-pay

## 2024-03-15 ENCOUNTER — Observation Stay (HOSPITAL_COMMUNITY)

## 2024-03-15 DIAGNOSIS — Z823 Family history of stroke: Secondary | ICD-10-CM

## 2024-03-15 DIAGNOSIS — C787 Secondary malignant neoplasm of liver and intrahepatic bile duct: Principal | ICD-10-CM | POA: Diagnosis present

## 2024-03-15 DIAGNOSIS — E871 Hypo-osmolality and hyponatremia: Secondary | ICD-10-CM | POA: Diagnosis present

## 2024-03-15 DIAGNOSIS — K759 Inflammatory liver disease, unspecified: Secondary | ICD-10-CM | POA: Diagnosis not present

## 2024-03-15 DIAGNOSIS — C259 Malignant neoplasm of pancreas, unspecified: Secondary | ICD-10-CM | POA: Diagnosis present

## 2024-03-15 DIAGNOSIS — Z801 Family history of malignant neoplasm of trachea, bronchus and lung: Secondary | ICD-10-CM

## 2024-03-15 DIAGNOSIS — R0989 Other specified symptoms and signs involving the circulatory and respiratory systems: Secondary | ICD-10-CM | POA: Diagnosis not present

## 2024-03-15 DIAGNOSIS — Z85118 Personal history of other malignant neoplasm of bronchus and lung: Secondary | ICD-10-CM | POA: Diagnosis not present

## 2024-03-15 DIAGNOSIS — Z1152 Encounter for screening for COVID-19: Secondary | ICD-10-CM

## 2024-03-15 DIAGNOSIS — K750 Abscess of liver: Secondary | ICD-10-CM | POA: Diagnosis present

## 2024-03-15 DIAGNOSIS — Z888 Allergy status to other drugs, medicaments and biological substances status: Secondary | ICD-10-CM

## 2024-03-15 DIAGNOSIS — E876 Hypokalemia: Secondary | ICD-10-CM | POA: Diagnosis present

## 2024-03-15 DIAGNOSIS — Z833 Family history of diabetes mellitus: Secondary | ICD-10-CM

## 2024-03-15 DIAGNOSIS — R7881 Bacteremia: Secondary | ICD-10-CM | POA: Diagnosis present

## 2024-03-15 DIAGNOSIS — R531 Weakness: Secondary | ICD-10-CM | POA: Diagnosis not present

## 2024-03-15 DIAGNOSIS — R9389 Abnormal findings on diagnostic imaging of other specified body structures: Secondary | ICD-10-CM | POA: Diagnosis not present

## 2024-03-15 DIAGNOSIS — K529 Noninfective gastroenteritis and colitis, unspecified: Secondary | ICD-10-CM | POA: Diagnosis present

## 2024-03-15 DIAGNOSIS — B9689 Other specified bacterial agents as the cause of diseases classified elsewhere: Secondary | ICD-10-CM | POA: Diagnosis present

## 2024-03-15 DIAGNOSIS — R188 Other ascites: Secondary | ICD-10-CM | POA: Diagnosis not present

## 2024-03-15 DIAGNOSIS — K219 Gastro-esophageal reflux disease without esophagitis: Secondary | ICD-10-CM | POA: Diagnosis present

## 2024-03-15 DIAGNOSIS — R180 Malignant ascites: Secondary | ICD-10-CM | POA: Diagnosis present

## 2024-03-15 DIAGNOSIS — Z885 Allergy status to narcotic agent status: Secondary | ICD-10-CM

## 2024-03-15 DIAGNOSIS — K8689 Other specified diseases of pancreas: Secondary | ICD-10-CM | POA: Diagnosis not present

## 2024-03-15 DIAGNOSIS — D61810 Antineoplastic chemotherapy induced pancytopenia: Secondary | ICD-10-CM | POA: Diagnosis present

## 2024-03-15 DIAGNOSIS — R7401 Elevation of levels of liver transaminase levels: Secondary | ICD-10-CM

## 2024-03-15 DIAGNOSIS — K769 Liver disease, unspecified: Secondary | ICD-10-CM | POA: Diagnosis not present

## 2024-03-15 DIAGNOSIS — I700 Atherosclerosis of aorta: Secondary | ICD-10-CM | POA: Diagnosis present

## 2024-03-15 DIAGNOSIS — Z8249 Family history of ischemic heart disease and other diseases of the circulatory system: Secondary | ICD-10-CM

## 2024-03-15 DIAGNOSIS — G893 Neoplasm related pain (acute) (chronic): Secondary | ICD-10-CM | POA: Diagnosis present

## 2024-03-15 DIAGNOSIS — T451X5A Adverse effect of antineoplastic and immunosuppressive drugs, initial encounter: Secondary | ICD-10-CM | POA: Diagnosis present

## 2024-03-15 DIAGNOSIS — Z808 Family history of malignant neoplasm of other organs or systems: Secondary | ICD-10-CM

## 2024-03-15 DIAGNOSIS — Z803 Family history of malignant neoplasm of breast: Secondary | ICD-10-CM

## 2024-03-15 DIAGNOSIS — K7460 Unspecified cirrhosis of liver: Secondary | ICD-10-CM | POA: Diagnosis present

## 2024-03-15 LAB — CBC WITH DIFFERENTIAL/PLATELET
Abs Immature Granulocytes: 0.05 K/uL (ref 0.00–0.07)
Basophils Absolute: 0 K/uL (ref 0.0–0.1)
Basophils Relative: 0 %
Eosinophils Absolute: 0 K/uL (ref 0.0–0.5)
Eosinophils Relative: 0 %
HCT: 29.7 % — ABNORMAL LOW (ref 36.0–46.0)
Hemoglobin: 8.9 g/dL — ABNORMAL LOW (ref 12.0–15.0)
Immature Granulocytes: 2 %
Lymphocytes Relative: 2 %
Lymphs Abs: 0.1 K/uL — ABNORMAL LOW (ref 0.7–4.0)
MCH: 26.3 pg (ref 26.0–34.0)
MCHC: 30 g/dL (ref 30.0–36.0)
MCV: 87.6 fL (ref 80.0–100.0)
Monocytes Absolute: 0.3 K/uL (ref 0.1–1.0)
Monocytes Relative: 9 %
Neutro Abs: 2.7 K/uL (ref 1.7–7.7)
Neutrophils Relative %: 87 %
Platelets: 78 K/uL — ABNORMAL LOW (ref 150–400)
RBC: 3.39 MIL/uL — ABNORMAL LOW (ref 3.87–5.11)
RDW: 18.7 % — ABNORMAL HIGH (ref 11.5–15.5)
Smear Review: NORMAL
WBC: 3.1 K/uL — ABNORMAL LOW (ref 4.0–10.5)
nRBC: 0.7 % — ABNORMAL HIGH (ref 0.0–0.2)

## 2024-03-15 LAB — COMPREHENSIVE METABOLIC PANEL WITH GFR
ALT: 115 U/L — ABNORMAL HIGH (ref 0–44)
AST: 144 U/L — ABNORMAL HIGH (ref 15–41)
Albumin: 2.5 g/dL — ABNORMAL LOW (ref 3.5–5.0)
Alkaline Phosphatase: 274 U/L — ABNORMAL HIGH (ref 38–126)
Anion gap: 9 (ref 5–15)
BUN: 15 mg/dL (ref 8–23)
CO2: 24 mmol/L (ref 22–32)
Calcium: 8 mg/dL — ABNORMAL LOW (ref 8.9–10.3)
Chloride: 98 mmol/L (ref 98–111)
Creatinine, Ser: 1.04 mg/dL — ABNORMAL HIGH (ref 0.44–1.00)
GFR, Estimated: >60 mL/min (ref >60)
Glucose, Bld: 108 mg/dL — ABNORMAL HIGH (ref 70–99)
Potassium: 2.6 mmol/L — CL (ref 3.5–5.1)
Sodium: 132 mmol/L — ABNORMAL LOW (ref 135–145)
Total Bilirubin: 1 mg/dL (ref 0.0–1.2)
Total Protein: 5.2 g/dL — ABNORMAL LOW (ref 6.5–8.1)

## 2024-03-15 LAB — I-STAT CG4 LACTIC ACID, ED: Lactic Acid, Venous: 1.2 mmol/L (ref 0.5–1.9)

## 2024-03-15 LAB — RESP PANEL BY RT-PCR (RSV, FLU A&B, COVID)  RVPGX2
Influenza A by PCR: NEGATIVE
Influenza B by PCR: NEGATIVE
Resp Syncytial Virus by PCR: NEGATIVE
SARS Coronavirus 2 by RT PCR: NEGATIVE

## 2024-03-15 LAB — LIPASE, BLOOD: Lipase: <10 U/L — ABNORMAL LOW (ref 11–51)

## 2024-03-15 LAB — MAGNESIUM: Magnesium: 1.7 mg/dL (ref 1.7–2.4)

## 2024-03-15 MED ORDER — ONDANSETRON HCL 4 MG/2ML IJ SOLN
4.0000 mg | Freq: Four times a day (QID) | INTRAMUSCULAR | Status: DC | PRN
  Administered 2024-03-16 – 2024-03-18 (×5): 4 mg via INTRAVENOUS
  Filled 2024-03-15 (×5): qty 2

## 2024-03-15 MED ORDER — ONDANSETRON HCL 4 MG PO TABS
4.0000 mg | ORAL_TABLET | Freq: Four times a day (QID) | ORAL | Status: DC | PRN
  Administered 2024-03-15 – 2024-03-19 (×4): 4 mg via ORAL
  Filled 2024-03-15 (×5): qty 1

## 2024-03-15 MED ORDER — POTASSIUM CHLORIDE 10 MEQ/100ML IV SOLN
10.0000 meq | INTRAVENOUS | Status: AC
  Administered 2024-03-15 (×3): 10 meq via INTRAVENOUS
  Filled 2024-03-15 (×3): qty 100

## 2024-03-15 MED ORDER — SODIUM CHLORIDE 0.9% FLUSH
10.0000 mL | Freq: Two times a day (BID) | INTRAVENOUS | Status: DC
  Administered 2024-03-15 – 2024-03-18 (×4): 10 mL

## 2024-03-15 MED ORDER — FENTANYL CITRATE (PF) 50 MCG/ML IJ SOSY
25.0000 ug | PREFILLED_SYRINGE | Freq: Once | INTRAMUSCULAR | Status: AC
  Administered 2024-03-15: 25 ug via INTRAVENOUS
  Filled 2024-03-15: qty 1

## 2024-03-15 MED ORDER — CHLORHEXIDINE GLUCONATE CLOTH 2 % EX PADS
6.0000 | MEDICATED_PAD | Freq: Every day | CUTANEOUS | Status: DC
  Administered 2024-03-16 – 2024-03-19 (×4): 6 via TOPICAL

## 2024-03-15 MED ORDER — PANTOPRAZOLE SODIUM 40 MG IV SOLR
40.0000 mg | Freq: Once | INTRAVENOUS | Status: AC
  Administered 2024-03-15: 40 mg via INTRAVENOUS
  Filled 2024-03-15: qty 10

## 2024-03-15 MED ORDER — HYDROMORPHONE HCL 1 MG/ML IJ SOLN
0.5000 mg | Freq: Once | INTRAMUSCULAR | Status: AC
  Administered 2024-03-15: 0.5 mg via INTRAVENOUS
  Filled 2024-03-15: qty 1

## 2024-03-15 MED ORDER — ONDANSETRON HCL 4 MG/2ML IJ SOLN
4.0000 mg | Freq: Once | INTRAMUSCULAR | Status: AC
  Administered 2024-03-15: 4 mg via INTRAVENOUS
  Filled 2024-03-15: qty 2

## 2024-03-15 MED ORDER — SPIRONOLACTONE 25 MG PO TABS
100.0000 mg | ORAL_TABLET | Freq: Every day | ORAL | Status: DC
  Filled 2024-03-15: qty 4

## 2024-03-15 MED ORDER — IOHEXOL 300 MG/ML  SOLN
100.0000 mL | Freq: Once | INTRAMUSCULAR | Status: AC | PRN
  Administered 2024-03-15: 100 mL via INTRAVENOUS

## 2024-03-15 MED ORDER — SODIUM CHLORIDE 0.9% FLUSH: 10.0000 mL | INTRAVENOUS | Status: DC | PRN

## 2024-03-15 MED ORDER — OXYCODONE HCL 5 MG PO TABS
5.0000 mg | ORAL_TABLET | ORAL | Status: DC | PRN
  Administered 2024-03-15 – 2024-03-17 (×3): 5 mg via ORAL
  Filled 2024-03-15 (×3): qty 1

## 2024-03-15 MED ORDER — ORAL CARE MOUTH RINSE: 15.0000 mL | OROMUCOSAL | Status: DC | PRN

## 2024-03-15 MED ORDER — FUROSEMIDE 40 MG PO TABS
40.0000 mg | ORAL_TABLET | Freq: Every morning | ORAL | Status: DC
Start: 2024-03-16 — End: 2024-03-16
  Filled 2024-03-15: qty 1

## 2024-03-15 MED ORDER — ALBUTEROL SULFATE (2.5 MG/3ML) 0.083% IN NEBU: 2.5000 mg | INHALATION_SOLUTION | RESPIRATORY_TRACT | Status: DC | PRN

## 2024-03-15 MED ORDER — TRAZODONE HCL 50 MG PO TABS: 25.0000 mg | ORAL_TABLET | Freq: Every evening | ORAL | Status: DC | PRN

## 2024-03-15 MED ORDER — OMEPRAZOLE 20 MG PO CPDR
20.0000 mg | DELAYED_RELEASE_CAPSULE | Freq: Two times a day (BID) | ORAL | Status: DC
  Administered 2024-03-16 – 2024-03-19 (×7): 20 mg via ORAL
  Filled 2024-03-15 (×10): qty 1

## 2024-03-15 MED ORDER — LACTATED RINGERS IV BOLUS
1000.0000 mL | Freq: Once | INTRAVENOUS | Status: AC
  Administered 2024-03-15: 1000 mL via INTRAVENOUS

## 2024-03-15 MED ORDER — IBUPROFEN 200 MG PO TABS
400.0000 mg | ORAL_TABLET | Freq: Four times a day (QID) | ORAL | Status: DC | PRN
  Administered 2024-03-16 – 2024-03-19 (×8): 400 mg via ORAL
  Filled 2024-03-15 (×8): qty 2

## 2024-03-15 NOTE — H&P (Signed)
 History and Physical  Nicole Johnson FMW:[id number] DOB: [birth year] DOA: 03/15/2024  PCP: Randeen Laine DELENA, MD   Chief Complaint: Weakness, back pain  HPI: Nicole Johnson is a 63 y.o. female with medical history significant for pancreatic cancer undergoing chemotherapy at [facility], GERD who is being admitted to the hospital with worsening back pain and abnormal LFTs in the setting of recent large-volume paracentesis.  She underwent 3 L therapeutic paracentesis with IR on 10/22, tolerated this well.  States that after the procedure, she started having worsening right posterior flank pain, which she was part of her chronic cancer pain.  She was also feeling very weak, felt that she must be anemic.  She denies any fevers, any anterior abdominal pain, nausea, vomiting or any other new concerns.  Workup in the emergency department as detailed below shows evidence of hypokalemia, hyponatremia, and rapidly worsening LFTs compared to lab work done at [facility] just yesterday.  Review of Systems: Please see HPI for pertinent positives and negatives. A complete 10 system review of systems are otherwise negative.  Past Medical History:  Diagnosis Date   Arthritis    Cancer (HCC)    pancreatic   GERD (gastroesophageal reflux disease)    PONV (postoperative nausea and vomiting)    Prediabetes    Past Surgical History:  Procedure Laterality Date   BILIARY BRUSHING  12/17/2022   Procedure: BILIARY BRUSHING;  Surgeon: Rollin Dover, MD;  Location: [facility];  Service: Gastroenterology;;   BILIARY STENT PLACEMENT N/A 12/17/2022   Procedure: BILIARY STENT PLACEMENT;  Surgeon: Rollin Dover, MD;  Location: [facility];  Service: Gastroenterology;  Laterality: N/A;   BILIARY STENT PLACEMENT N/A 04/05/2023   Procedure: BILIARY STENT PLACEMENT;  Surgeon: Rollin Dover, MD;  Location: [facility];  Service: Gastroenterology;  Laterality: N/A;   COLONOSCOPY N/A 10/11/2023   Procedure: COLONOSCOPY;  Surgeon: Rollin Dover,  MD;  Location: [facility];  Service: Gastroenterology;  Laterality: N/A;   ERCP N/A 12/17/2022   Procedure: ENDOSCOPIC RETROGRADE CHOLANGIOPANCREATOGRAPHY (ERCP);  Surgeon: Rollin Dover, MD;  Location: [facility];  Service: Gastroenterology;  Laterality: N/A;   ERCP N/A 04/05/2023   Procedure: ENDOSCOPIC RETROGRADE CHOLANGIOPANCREATOGRAPHY (ERCP);  Surgeon: Rollin Dover, MD;  Location: [facility];  Service: Gastroenterology;  Laterality: N/A;   ESOPHAGOGASTRODUODENOSCOPY N/A 10/11/2023   Procedure: EGD (ESOPHAGOGASTRODUODENOSCOPY);  Surgeon: Rollin Dover, MD;  Location: [facility];  Service: Gastroenterology;  Laterality: N/A;   ESOPHAGOGASTRODUODENOSCOPY (EGD) WITH PROPOFOL  N/A 12/18/2022   Procedure: ESOPHAGOGASTRODUODENOSCOPY (EGD) WITH PROPOFOL ;  Surgeon: Rollin Dover, MD;  Location: [facility];  Service: Gastroenterology;  Laterality: N/A;   ESOPHAGOGASTRODUODENOSCOPY (EGD) WITH PROPOFOL  N/A 12/30/2022   Procedure: ESOPHAGOGASTRODUODENOSCOPY (EGD) WITH PROPOFOL ;  Surgeon: Rollin Dover, MD;  Location[facility];  Service: Gastroenterology;  Laterality: N/A;   EUS N/A 12/18/2022   Procedure: UPPER ENDOSCOPIC ULTRASOUND (EUS) LINEAR;  Surgeon: Rollin Dover, MD;  Location: [facility];  Service: Gastroenterology;  Laterality: N/A;   FINE NEEDLE ASPIRATION N/A 12/18/2022   Procedure: FINE NEEDLE ASPIRATION (FNA) LINEAR;  Surgeon: Rollin Dover, MD;  Location: [facility];  Service: Gastroenterology;  Laterality: N/A;   FINE NEEDLE ASPIRATION  12/30/2022   Procedure: FINE NEEDLE ASPIRATION (FNA) LINEAR;  Surgeon: Rollin Dover, MD;  Location: [facility] ENDOSCOPY;  Service: Gastroenterology;;   IR PARACENTESIS  01/17/2024   IR PARACENTESIS  02/15/2024   LAPAROSCOPIC ABDOMINAL EXPLORATION     SPHINCTEROTOMY  12/17/2022   Procedure: ANNETT;  Surgeon: Rollin Dover, MD;  Location: [facility];  Service: Gastroenterology;;  STENT REMOVAL  04/05/2023   Procedure: STENT REMOVAL;  Surgeon:  Rollin Dover, MD;  Location: [facility];  Service: Gastroenterology;;   UPPER ESOPHAGEAL ENDOSCOPIC ULTRASOUND (EUS) N/A 12/30/2022   Procedure: UPPER ESOPHAGEAL ENDOSCOPIC ULTRASOUND (EUS);  Surgeon: Rollin Dover, MD;  Location: [facility] ENDOSCOPY;  Service: Gastroenterology;  Laterality: N/A;   Social History:  reports that she has never smoked. She has never used smokeless tobacco. She reports that she does not currently use alcohol . She reports that she does not use drugs.  Allergies  Allergen Reactions   Other Nausea Only and Other (See Comments)    Certain types of Anesthesia cause SEVERE NAUSEA   Codeine Nausea Only   Hydrocodone -Acetaminophen  Nausea Only and Other (See Comments)    Reaction unconfirmed,ed/might be nausea   Meloxicam Nausea Only   Tramadol Nausea Only    Family History  Problem Relation Age of Onset   Diabetes Mother    Stroke Mother    Lung cancer Father 56       smoked   Diabetes Father    Diabetes Sister    Breast cancer Sister 20   Diabetes Brother    Cancer Brother 60       neck cancer   Heart disease Maternal Grandmother    Breast cancer Maternal Grandmother        dx. <50, double mastectomy   Heart disease Maternal Grandfather      Prior to Admission medications   Medication Sig Start Date End Date Taking? Authorizing Provider  ADVIL 200 MG CAPS Take 400 mg by mouth every 6 (six) hours as needed (for pain- rotate with 1,300 mg of Tylenol ).   Yes [provider]  Cyanocobalamin  (VITAMIN B-12 PO) Place 1 tablet under the tongue daily.   Yes [provider]  FULPHILA 6 MG/0.6ML injection Inject 12 mg into the skin See admin instructions. Inject 12 mg into the skin every other Wednesday   Yes [provider]  furosemide  (LASIX ) 40 MG tablet Take 40 mg by mouth in the morning.   Yes [provider]  MAGNESIUM PO Take 2 tablets by mouth daily.   Yes [provider]  ondansetron  (ZOFRAN ) 8 MG tablet Take 8 mg  by mouth 2 (two) times daily as needed for nausea or vomiting.   Yes [provider]  PRILOSEC OTC 20 MG tablet Take 20 mg by mouth 2 (two) times daily before a meal.   Yes [provider]  prochlorperazine  (COMPAZINE ) 10 MG tablet Take 10 mg by mouth every 6 (six) hours as needed for nausea or vomiting.   Yes [provider]  spironolactone  (ALDACTONE ) 100 MG tablet Take 100 mg by mouth daily.   Yes [provider]  TYLENOL  8 HOUR ARTHRITIS PAIN 650 MG CR tablet Take 1,300 mg by mouth every 8 (eight) hours as needed for pain (rotate with 400 mg of Advil).   Yes [provider]  omeprazole  (PRILOSEC) 20 MG capsule Take 1 capsule (20 mg total) by mouth 2 (two) times daily before a meal. Patient not taking: Reported on 03/15/2024 10/24/23   Tower, Laine LABOR, MD  spironolactone  (ALDACTONE ) 25 MG tablet Take 0.5 tablets (12.5 mg total) by mouth daily. Patient not taking: Reported on 03/15/2024 01/18/24   Randeen Laine LABOR, MD    Physical Exam: BP 94/61 (BP Location: Left Arm)   Pulse 80   Temp 98.9 F (37.2 C) (Oral)   Resp 16   LMP 01/11/2011  SpO2 100%  General:  Alert, oriented, calm, in no acute distress, her husband is at the bedside.  She looks pale, and chronically ill. Cardiovascular: RRR, no murmurs or rubs, 3+ pitting bilateral lower extremity edema  Respiratory: clear to auscultation bilaterally, no wheezes, no crackles  Abdomen: soft, nontender, distended, normal bowel tones heard  Skin: dry, no rashes  Musculoskeletal: no joint effusions, normal range of motion  Psychiatric: appropriate affect, normal speech  Neurologic: extraocular muscles intact, clear speech, moving all extremities with intact sensorium         Labs on Admission:  Basic Metabolic Panel: Recent Labs  Lab 03/15/24 1338  NA 132*  K 2.6*  CL 98  CO2 24  GLUCOSE 108*  BUN 15  CREATININE 1.04*  CALCIUM 8.0*  MG 1.7   Liver Function Tests: Recent Labs  Lab  03/15/24 1338  AST 144*  ALT 115*  ALKPHOS 274*  BILITOT 1.0  PROT 5.2*  ALBUMIN 2.5*   Recent Labs  Lab 03/15/24 1338  LIPASE <10*   No results for input(s): AMMONIA in the last 168 hours. CBC: Recent Labs  Lab 03/15/24 1338  WBC 3.1*  NEUTROABS 2.7  HGB 8.9*  HCT 29.7*  MCV 87.6  PLT 78*   Cardiac Enzymes: No results for input(s): CKTOTAL, CKMB, CKMBINDEX, TROPONINI in the last 168 hours. BNP (last 3 results) No results for input(s): BNP in the last 8760 hours.  ProBNP (last 3 results) Recent Labs    04/08/23 0928  PROBNP 93.0    CBG: No results for input(s): GLUCAP in the last 168 hours.  Radiological Exams on Admission: CT ABDOMEN PELVIS W CONTRAST Result Date: 03/15/2024 CLINICAL DATA:  Recent paracentesis history of pancreas cancer worsening abdominal pain EXAM: CT ABDOMEN AND PELVIS WITH CONTRAST TECHNIQUE: Multidetector CT imaging of the abdomen and pelvis was performed using the standard protocol following bolus administration of intravenous contrast. RADIATION DOSE REDUCTION: This exam was performed according to the departmental dose-optimization program which includes automated exposure control, adjustment of the mA and/or kV according to patient size and/or use of iterative reconstruction technique. CONTRAST:  OMNIPAQUE  IOHEXOL  300 MG/ML  SOLN COMPARISON:  CT 10/08/2023, 04/02/2023, 01/11/2023, MRI 12/16/2022 FINDINGS: Lower chest: Lung bases demonstrate no acute airspace disease. Vascular catheter tip at the cavoatrial junction. Hepatobiliary: Trace pneumobilia. Indwelling common duct stent with some gas in the stent at the porta hepatis but mostly opacified appearance of the stent compared to the prior CT from May. Slight interval decrease in size of a hypodense low-density mass near the gallbladder fossa, today measuring 19 mm on series 2, image 24, previously 4.5 cm. Contracted gallbladder without calcified stone. Interim development of  multiple cystic appearing lesions in the posterior right hepatic lobe, largest measuring up to 2.6 cm on series 2, image 19. Focal wedge-shaped hypo density within the posterior right hepatic lobe extending to the subcapsular surface, series 2, image 17. Pancreas: Poorly defined infiltrative proximal pancreatic mass as was seen on prior exams, difficult to measure given infiltrative appearance. Vascular involvement as was seen on prior, involving the proximal SMA, celiac bifurcation and portal splenic confluence. Intra hepatic portal vessels are patent. Spleen: Enlarged, craniocaudal measurement of 19 cm. Adrenals/Urinary Tract: Adrenal glands are normal. Kidneys show no significant hydronephrosis. The bladder is distended Stomach/Bowel: Abnormal masslike thickening of the pylorus and duodenal bulb, series 2, image 25 through 27. Fluid-filled loops of small bowel with mucosal enhancement but no obstructive features. Colon wall thickening, mucosal enhancement and  indistinct appearance of the ascending, transverse, descending and proximal sigmoid colon. Moderate stool in the rectosigmoid colon. Vascular/Lymphatic: Mild atherosclerosis.  No aneurysm. Reproductive: Uterus unremarkable.  No adnexal mass Other: No free air. Moderate volume ascites within the abdomen and pelvis. Generalized subcutaneous edema. Musculoskeletal: Multilevel degenerative change. No acute osseous abnormality. IMPRESSION: 1. Poorly defined infiltrative proximal pancreatic mass with vascular involvement as was seen on prior exams and corresponding to the history of malignancy. 2. Decreased size of previously noted thick walled cystic lesion in the right hepatic lobe near the porta hepatis however interim development of multiple cystic appearing lesions in the posterior right hepatic lobe, largest measuring up to 2.6 cm. Findings could be secondary to intra hepatic abscess versus metastatic disease. Somewhat wedge-shaped area of hypoattenuation  adjacent to the multi cystic changes in the right hepatic lobe which may be due to altered perfusion. 3. Indwelling common duct stent with some gas in the stent at the porta hepatis but mostly opacified appearance of the stent compared to the prior CT from May. Some pneumobilia is present. 4. Abnormal masslike thickening of the pylorus and duodenal bulb, this could be inflammatory but infiltrative process/neoplastic involvement is also a consideration. 5. Fluid-filled loops of small bowel with mucosal enhancement but no obstructive features. Colon wall thickening, mucosal enhancement and indistinct appearance of the ascending, transverse, descending and proximal sigmoid colon. Findings are suspicious for enterocolitis. 6. Moderate volume ascites within the abdomen and pelvis. Generalized subcutaneous edema. 7. Splenomegaly. 8. Aortic atherosclerosis. Aortic Atherosclerosis (ICD10-I70.0). Electronically Signed   By: Luke Bun M.D.   On: 03/15/2024 16:24   DG Chest Portable 1 View Result Date: 03/15/2024 EXAM: 1 VIEW(S) XRAY OF THE CHEST 03/15/2024 01:40:46 PM COMPARISON: CT chest 01/11/2023 CLINICAL HISTORY: gen weakness, h/o cancer. General weakness, hx cancer FINDINGS: LINES, TUBES AND DEVICES: Right CT Port-A-Cath in place with tip at cavoatrial junction. LUNGS AND PLEURA: Low lung volumes. Elevated right hemidiaphragm. No focal pulmonary opacity. No pulmonary edema. No pleural effusion. No pneumothorax. HEART AND MEDIASTINUM: No acute abnormality of the cardiac and mediastinal silhouettes. BONES AND SOFT TISSUES: No acute osseous abnormality. IMPRESSION: 1. No acute cardiopulmonary findings. 2. Low lung volumes with mildly elevated right hemidiaphragm. Electronically signed by: Waddell Calk MD 03/15/2024 02:17 PM EDT RP Workstation: [id number]   US  Paracentesis Result Date: 03/14/2024 INDICATION: ASCITES Patient with history of metastatic pancreatic cancer with recurrent malignant ascites. Request  received for therapeutic paracentesis. EXAM: ULTRASOUND GUIDED THERAPEUTIC PARACENTESIS MEDICATIONS: 8 mL 1% lidocaine  with epinephrine  to skin/subcutaneous tissue COMPLICATIONS: None immediate. PROCEDURE: Informed written consent was obtained from the patient after a discussion of the risks, benefits and alternatives to treatment. A timeout was performed prior to the initiation of the procedure. Initial ultrasound scanning demonstrates a moderate amount of ascites within the left mid to lower abdominal quadrant. The left mid to lower abdomen was prepped and draped in the usual sterile fashion. 1% lidocaine  was used for local anesthesia. Following this, a 19 gauge, 10-cm, Yueh catheter was introduced. An ultrasound image was saved for documentation purposes. The paracentesis was performed. The catheter was removed and a dressing was applied. The patient tolerated the procedure well without immediate post procedural complication. FINDINGS: A total of approximately 3.0 L of yellow fluid was removed. IMPRESSION: Successful ultrasound-guided therapeutic paracentesis yielding 3.0 L of peritoneal fluid. Performed by: Franky Rakers , PA-C under direct supervision of Thom Hall, MD Electronically Signed   By: Thom Hall M.D.   On: 03/14/2024 16:39  Assessment/Plan Nicole Johnson is a 63 y.o. female with medical history significant for pancreatic cancer on chemotherapy at [facility] as well as GERD GERD who is being admitted to the hospital with worsening back pain and abnormal LFTs in the setting of recent large-volume paracentesis.  Abnormal LFTs and abdominal pain-in the setting of new cystic lesions in the right hepatic lobe which were not present previously.  These could represent metastatic disease versus abscess, versus other.  She does not have symptoms of acute infection such as fever, leukocytosis, etc.  Lactic acid is normal.  Also noted her biliary stent is mainly opacified so it may be occluded. -Observation  admission -Follow-up right upper quadrant ultrasound with Doppler -Avoid hepatotoxins and trend LFTs, note on 10/22 she had AST 41, ALT 53, alk phos 274, total bilirubin 0.9.  Today there is significant elevation of AST and ALT -GI consult requested, secure chat message sent to Dr. Rollin -Will make patient n.p.o. after midnight  Hypokalemia-potentially due to diuretic use, no history of significant GI losses -Replete potassium chloride  IV, recheck in the morning  Hypotension-symptomatic, likely due to large-volume paracentesis, however as mentioned above no evidence of sepsis.  Pancreatic cancer-followed by Dr. Lanny locally in conjunction with [facility], with evidence of cirrhosis and chronic ascites -Continue Aldactone  and Lasix  -Follow-up with oncology  Pancytopenia-likely due to chemotherapy, no transfusions indicated  DVT prophylaxis: SCDs only    Code Status: Full Code  Consults called: Dr. Lanny added to inpatient treatment team, secure chat message requesting consult sent to Dr. Rollin.  Admission status: Observation  Time spent: 53 minutes  Deaja Rizo CHRISTELLA Gail MD [facility] Pager [phone]  If 7PM-7AM, please contact night-coverage [email or web address] Password [email or web address]  03/15/2024, 6:04 PM

## 2024-03-15 NOTE — ED Provider Triage Note (Signed)
 Emergency Medicine Provider Triage Evaluation Note  Nicole Johnson , a 63 y.o. female  was evaluated in triage.  Pt complains of weakness, and appearing pale.  Has history of anemia.  Hemoglobin yesterday was 7.1.  She had an appointment with her hematologist today at [facility] but she was not feeling well enough to make the drive.  Husband believes there is plan to transfuse today.  She has history of pancreatic cancer.  She had paracentesis performed yesterday and had 3 L removed.  Review of Systems  Positive: As above Negative: As above  Physical Exam  BP 98/70   Pulse (!) 113   Temp 99.1 F (37.3 C)   Resp 18   LMP 01/11/2011   SpO2 100%  Gen:   Awake, no distress   Resp:  Normal effort  MSK:   Moves extremities without difficulty  Other:    Medical Decision Making  Medically screening exam initiated at 12:22 PM.  Appropriate orders placed.  Nicole Johnson was informed that the remainder of the evaluation will be completed by another provider, this initial triage assessment does not replace that evaluation, and the importance of remaining in the ED until their evaluation is complete.     Hildegard Loge, PA-C 03/15/24 1223

## 2024-03-15 NOTE — ED Notes (Signed)
 Pt wants her PORT accessed for bloodwork

## 2024-03-15 NOTE — ED Triage Notes (Signed)
 Pt reports weakness and back pain. PT stating she had 3L of fluid drawn from her abdomen on Wednesday. Pt reports her last Hgb was 7.1 yesterday.

## 2024-03-15 NOTE — ED Provider Notes (Signed)
 [place] EMERGENCY DEPARTMENT AT [facility] Provider Note   CSN: [id number] Arrival date & time: 03/15/24  1147     History  Chief Complaint  Patient presents with   Weakness    Nicole Johnson is a 63 y.o. female with pancreatic cancer followed by [facility] who presents with generalized weakness and appearing pale.  Has history of anemia.  Hemoglobin yesterday was 7.1.  She had an appointment with her hematologist today at [facility] ([place]) but she was not feeling well enough to make the drive.  Husband believes there is plan to transfuse today.  She has history of pancreatic cancer.  She had paracentesis performed yesterday and had 3 L removed.  She is also been complaining of a couple of days of severe abdominal pain.  She does normally have some abdominal pain but this is unusual and not normal for her.  Currently rated 4 out of 10 but last night was 9 out of 10.  Radiating through to her back on her left side.  No significant nausea or vomiting, fevers chills, urinary symptoms, diarrhea/constipation, hematochezia or melena.   Past Medical History:  Diagnosis Date   Arthritis    Cancer (HCC)    pancreatic   GERD (gastroesophageal reflux disease)    PONV (postoperative nausea and vomiting)    Prediabetes        Home Medications Prior to Admission medications   Medication Sig Start Date End Date Taking? Authorizing Provider  cetirizine (ZYRTEC) 10 MG chewable tablet Chew 10 mg by mouth daily. As needed    [provider]  omeprazole  (PRILOSEC) 20 MG capsule Take 1 capsule (20 mg total) by mouth 2 (two) times daily before a meal. 10/24/23   Tower, Laine DELENA, MD  Probiotic Product (PROBIOTIC DAILY PO) Take 1 tablet by mouth daily.    [provider]  spironolactone  (ALDACTONE ) 25 MG tablet Take 0.5 tablets (12.5 mg total) by mouth daily. 01/18/24   Tower, Laine DELENA, MD      Allergies    Other, Codeine, Hydrocodone -acetaminophen , Meloxicam, and Tramadol    Review of  Systems   Review of Systems A 10 point review of systems was performed and is negative unless otherwise reported in HPI.  Physical Exam Updated Vital Signs BP 98/70   Pulse (!) 113   Temp 99.1 F (37.3 C)   Resp 18   LMP 01/11/2011   SpO2 100%  Physical Exam General: Normal appearing female, lying in bed.  HEENT: PERRLA, Sclera anicteric, MMM, trachea midline.  Cardiology: RRR, no murmurs/rubs/gallops. Resp: Normal respiratory rate and effort. CTAB, no wheezes, rhonchi, crackles.  Abd: Soft, +LUE/LLQ TTP, +distended. No rebound tenderness or guarding.  GU: Deferred. MSK: No peripheral edema or signs of trauma. Extremities without deformity or TTP. No cyanosis or clubbing. Skin: warm, dry. No rashes or lesions. Back: +L CVA tenderness Neuro: A&Ox4, CNs II-XII grossly intact. MAEs. Sensation grossly intact.  Psych: Normal mood and affect.   ED Results / Procedures / Treatments   Labs (all labs ordered are listed, but only abnormal results are displayed) Labs Reviewed  CULTURE, BLOOD (ROUTINE X 2)  CULTURE, BLOOD (ROUTINE X 2)  RESP PANEL BY RT-PCR (RSV, FLU A&B, COVID)  RVPGX2  CBC WITH DIFFERENTIAL/PLATELET  COMPREHENSIVE METABOLIC PANEL WITH GFR  LIPASE, BLOOD  MAGNESIUM  I-STAT CG4 LACTIC ACID, ED  TYPE AND SCREEN    EKG EKG Interpretation Date/Time:  Thursday March 15 2024 12:14:09 EDT Ventricular Rate:  104 PR  Interval:  123 QRS Duration:  89 QT Interval:  347 QTC Calculation: 457 R Axis:   65  Text Interpretation: Sinus tachycardia Confirmed by Franklyn Gills [phone]) on 03/15/2024 1:09:06 PM  Radiology US  Paracentesis Result Date: 03/14/2024 INDICATION: ASCITES Patient with history of metastatic pancreatic cancer with recurrent malignant ascites. Request received for therapeutic paracentesis. EXAM: ULTRASOUND GUIDED THERAPEUTIC PARACENTESIS MEDICATIONS: 8 mL 1% lidocaine  with epinephrine  to skin/subcutaneous tissue COMPLICATIONS: None immediate. PROCEDURE:  Informed written consent was obtained from the patient after a discussion of the risks, benefits and alternatives to treatment. A timeout was performed prior to the initiation of the procedure. Initial ultrasound scanning demonstrates a moderate amount of ascites within the left mid to lower abdominal quadrant. The left mid to lower abdomen was prepped and draped in the usual sterile fashion. 1% lidocaine  was used for local anesthesia. Following this, a 19 gauge, 10-cm, Yueh catheter was introduced. An ultrasound image was saved for documentation purposes. The paracentesis was performed. The catheter was removed and a dressing was applied. The patient tolerated the procedure well without immediate post procedural complication. FINDINGS: A total of approximately 3.0 L of yellow fluid was removed. IMPRESSION: Successful ultrasound-guided therapeutic paracentesis yielding 3.0 L of peritoneal fluid. Performed by: Franky Rakers , PA-C under direct supervision of Thom Hall, MD Electronically Signed   By: Thom Hall M.D.   On: 03/14/2024 16:39    Procedures .Critical Care  Performed by: Franklyn Gills SAILOR, MD Authorized by: Franklyn Gills SAILOR, MD   Critical care provider statement:    Critical care time (minutes):  30   Critical care was necessary to treat or prevent imminent or life-threatening deterioration of the following conditions:  Dehydration   Critical care was time spent personally by me on the following activities:  Development of treatment plan with patient or surrogate, evaluation of patient's response to treatment, examination of patient, ordering and review of laboratory studies, ordering and review of radiographic studies, ordering and performing treatments and interventions, pulse oximetry, re-evaluation of patient's condition, review of old charts and obtaining history from patient or surrogate     Medications Ordered in ED Medications  HYDROmorphone (DILAUDID) injection 0.5 mg (has no  administration in time range)  ondansetron  (ZOFRAN ) injection 4 mg (has no administration in time range)    ED Course/ Medical Decision Making/ A&P                          Medical Decision Making Amount and/or Complexity of Data Reviewed Labs: ordered. Decision-making details documented in ED Course. Radiology: ordered. Decision-making details documented in ED Course.  Risk Prescription drug management. Decision regarding hospitalization.    This patient presents to the ED for concern of severe abdominal pain, decreased p.o. intake, generalized weakness, this involves an extensive number of treatment options, and is a complaint that carries with it a high risk of complications and morbidity.  I considered the following differential and admission for this acute, potentially life threatening condition.   MDM:    DDX for generalized weakness includes but is not limited to:  Patient is found to have hypokalemia 2.6, worsening LFTs.  She was concerned about possibly low hemoglobin her hemoglobin is mildly elevated today at 8.9, possibly indicating volume contraction or dehydration.  Will give back some fluid.  Additionally she is mildly hypotensive into the 90s systolic, consider hypovolemia versus septic shock.  She is afebrile and has a normal lactic acid, no leukocytosis,  lower concern for sepsis.  Will start with fluid resuscitation and reevaluate.  She had a paracentesis yesterday and is having abdominal pain with worsening LFTs, consider worsening metastatic disease, cancer related pain, pancreatitis, other acute hepatobiliary disease, PUD, GERD, vascular catastrophe.  She is not peritonitic and without any fever or leukocytosis I have a lower concern for SBP.  Will obtain a CT abdomen pelvis.  Reassuringly she has no significant hypo-/hyperglycemia.  No chest pain or shortness of breath to indicate ischemia, ACS, heart failure.  No focal neurodeficits to indicate an intracranial  process.  CT ab and pelvis demonstrates Prolida find pancreatic mass with history of malignancy.  She has hepatic cystic lesions developing that could be intrahepatic metastatic disease versus abscess.  Some pneumobilia.  Also masslike thickening of the pylorus and duodenal bulb concerning for metastatic disease and fluid-filled loops of small bowel concerning for enterocolitis.  Believe more likely metastatic lesions in the liver as opposed to abscesses.  Patient will likely need admission to the hospital, can consider surgery consult.  Will be signed out to oncoming EDP.   Clinical Course as of 03/18/24 0009  Thu Mar 15, 2024  1357 Lactic Acid, Venous: 1.2 wnl [HN]  1449 Potassium(!!): 2.6 +hypokalemia [HN]  1449 ALT(!): 115 [HN]  1449 AST(!): 144 Worsening LFTs [HN]  1449 Lipase(!): <10 [HN]  1450 Hemoglobin(!): 8.9 [HN]  1540 BP(!): 93/48 Will give IVF [HN]  1635 CT ABDOMEN PELVIS W CONTRAST 1. Poorly defined infiltrative proximal pancreatic mass with vascular involvement as was seen on prior exams and corresponding to the history of malignancy. 2. Decreased size of previously noted thick walled cystic lesion in the right hepatic lobe near the porta hepatis however interim development of multiple cystic appearing lesions in the posterior right hepatic lobe, largest measuring up to 2.6 cm. Findings could be secondary to intra hepatic abscess versus metastatic disease. Somewhat wedge-shaped area of  hypoattenuation adjacent to the multi cystic changes in the right hepatic lobe which may be due to altered perfusion. 3. Indwelling common duct stent with some gas in the stent at the porta hepatis but mostly opacified appearance of the stent compared to the prior CT from May. Some pneumobilia is present. 4. Abnormal masslike thickening of the pylorus and duodenal bulb, this could be inflammatory but infiltrative process/neoplastic involvement is also a consideration. 5. Fluid-filled loops of  small bowel with mucosal enhancement but no obstructive features. Colon wall thickening, mucosal enhancement and indistinct appearance of the ascending, transverse, descending and proximal sigmoid colon. Findings are suspicious for enterocolitis. 6. Moderate volume ascites within the abdomen and pelvis. Generalized subcutaneous edema. 7. Splenomegaly. 8. Aortic atherosclerosis.   [HN]  1656 CT done at [facility] on 10/15: Liver: Similar size of hypoattenuating lesion in the central liver  measuring 2.3 x 1.9 cm (10, 87), previously 2.3 x 2 cm. No new suspicious  hepatic lesions. There is persistent low attenuation soft tissue versus  fluid surrounding the central portal veins. Redemonstrated is severe  narrowing of the extrahepatic portal vein and SMV as it traverses this area  of soft tissue.   [TY]  1659 Evaluated patient, had a fairly large blood pressure cuff on, but a smaller 1 on she is 105/71 currently.  Husband notes that that is typically what her blood pressure has been the past several weeks.  She continues to have pain seemingly more so in her left flank than abdominal pain.  Her abdomen is soft, nontender.  However given her complex  past medical history her severe hypokalemia and transaminitis and persistent pain despite IV narcotics will admit for further pain control and hydration. [TY]    Clinical Course User Index [HN] Franklyn Sid SAILOR, MD [TY] Neysa Caron PARAS, DO    Labs: I Ordered, and personally interpreted labs.  The pertinent results include:  those listed above  Imaging Studies ordered: I ordered imaging studies including CXR I independently visualized and interpreted imaging. I agree with the radiologist interpretation  Additional history obtained from chart review.  External records from outside source obtained and reviewed including [facility] heme-onc notes  Cardiac Monitoring: The patient was maintained on a cardiac monitor.  I personally viewed and interpreted the  cardiac monitored which showed an underlying rhythm of: Normal sinus rhythm  Reevaluation: After the interventions noted above, I reevaluated the patient and found that they have :improved  Social Determinants of Health:  lives independently  Disposition:  Patient is signed out to the oncoming ED physician Dr. Neysa who is made aware of her history, presentation, exam, workup, and plan.    Co morbidities that complicate the patient evaluation  Past Medical History:  Diagnosis Date   Arthritis    Cancer (HCC)    pancreatic   GERD (gastroesophageal reflux disease)    PONV (postoperative nausea and vomiting)    Prediabetes      Medicines Meds ordered this encounter  Medications   HYDROmorphone (DILAUDID) injection 0.5 mg   ondansetron  (ZOFRAN ) injection 4 mg    I have reviewed the patients home medicines and have made adjustments as needed  Problem List / ED Course: Problem List Items Addressed This Visit   None Visit Diagnoses       Generalized weakness    -  Primary     Hypokalemia         Transaminitis         Enterocolitis         Metastases to the liver (HCC)       Relevant Medications   ondansetron  (ZOFRAN ) 8 MG tablet   prochlorperazine  (COMPAZINE ) 10 MG tablet   ceFEPIme (MAXIPIME) 2 g in sodium chloride  0.9 % 100 mL IVPB (Start on 03/18/2024  5:00 PM)                   This note was created using dictation software, which may contain spelling or grammatical errors.    Franklyn Sid SAILOR, MD 03/18/24 2035

## 2024-03-16 DIAGNOSIS — K7460 Unspecified cirrhosis of liver: Secondary | ICD-10-CM | POA: Diagnosis present

## 2024-03-16 DIAGNOSIS — B9689 Other specified bacterial agents as the cause of diseases classified elsewhere: Secondary | ICD-10-CM | POA: Diagnosis present

## 2024-03-16 DIAGNOSIS — K219 Gastro-esophageal reflux disease without esophagitis: Secondary | ICD-10-CM | POA: Diagnosis present

## 2024-03-16 DIAGNOSIS — E871 Hypo-osmolality and hyponatremia: Secondary | ICD-10-CM | POA: Diagnosis present

## 2024-03-16 DIAGNOSIS — G893 Neoplasm related pain (acute) (chronic): Secondary | ICD-10-CM | POA: Diagnosis present

## 2024-03-16 DIAGNOSIS — Z823 Family history of stroke: Secondary | ICD-10-CM | POA: Diagnosis not present

## 2024-03-16 DIAGNOSIS — Z833 Family history of diabetes mellitus: Secondary | ICD-10-CM | POA: Diagnosis not present

## 2024-03-16 DIAGNOSIS — R7881 Bacteremia: Secondary | ICD-10-CM | POA: Diagnosis present

## 2024-03-16 DIAGNOSIS — K759 Inflammatory liver disease, unspecified: Secondary | ICD-10-CM

## 2024-03-16 DIAGNOSIS — Z801 Family history of malignant neoplasm of trachea, bronchus and lung: Secondary | ICD-10-CM | POA: Diagnosis not present

## 2024-03-16 DIAGNOSIS — I700 Atherosclerosis of aorta: Secondary | ICD-10-CM | POA: Diagnosis present

## 2024-03-16 DIAGNOSIS — Z888 Allergy status to other drugs, medicaments and biological substances status: Secondary | ICD-10-CM | POA: Diagnosis not present

## 2024-03-16 DIAGNOSIS — C259 Malignant neoplasm of pancreas, unspecified: Secondary | ICD-10-CM | POA: Diagnosis present

## 2024-03-16 DIAGNOSIS — Z1152 Encounter for screening for COVID-19: Secondary | ICD-10-CM | POA: Diagnosis not present

## 2024-03-16 DIAGNOSIS — R180 Malignant ascites: Secondary | ICD-10-CM | POA: Diagnosis present

## 2024-03-16 DIAGNOSIS — Z808 Family history of malignant neoplasm of other organs or systems: Secondary | ICD-10-CM | POA: Diagnosis not present

## 2024-03-16 DIAGNOSIS — Z803 Family history of malignant neoplasm of breast: Secondary | ICD-10-CM | POA: Diagnosis not present

## 2024-03-16 DIAGNOSIS — T451X5A Adverse effect of antineoplastic and immunosuppressive drugs, initial encounter: Secondary | ICD-10-CM | POA: Diagnosis present

## 2024-03-16 DIAGNOSIS — C787 Secondary malignant neoplasm of liver and intrahepatic bile duct: Secondary | ICD-10-CM | POA: Diagnosis present

## 2024-03-16 DIAGNOSIS — E876 Hypokalemia: Secondary | ICD-10-CM | POA: Diagnosis present

## 2024-03-16 DIAGNOSIS — K529 Noninfective gastroenteritis and colitis, unspecified: Secondary | ICD-10-CM | POA: Diagnosis present

## 2024-03-16 DIAGNOSIS — D61810 Antineoplastic chemotherapy induced pancytopenia: Secondary | ICD-10-CM | POA: Diagnosis present

## 2024-03-16 DIAGNOSIS — Z8249 Family history of ischemic heart disease and other diseases of the circulatory system: Secondary | ICD-10-CM | POA: Diagnosis not present

## 2024-03-16 DIAGNOSIS — Z885 Allergy status to narcotic agent status: Secondary | ICD-10-CM | POA: Diagnosis not present

## 2024-03-16 DIAGNOSIS — K750 Abscess of liver: Secondary | ICD-10-CM | POA: Diagnosis present

## 2024-03-16 LAB — IRON AND TIBC
Iron: 11 ug/dL — ABNORMAL LOW (ref 28–170)
Saturation Ratios: 7 % — ABNORMAL LOW (ref 10.4–31.8)
TIBC: 161 ug/dL — ABNORMAL LOW (ref 250–450)
UIBC: 150 ug/dL

## 2024-03-16 LAB — BLOOD CULTURE ID PANEL (REFLEXED) - BCID2

## 2024-03-16 LAB — CBC
HCT: 23 % — ABNORMAL LOW (ref 36.0–46.0)
Hemoglobin: 7 g/dL — ABNORMAL LOW (ref 12.0–15.0)
MCH: 26.9 pg (ref 26.0–34.0)
MCHC: 30.4 g/dL (ref 30.0–36.0)
MCV: 88.5 fL (ref 80.0–100.0)
Platelets: 82 K/uL — ABNORMAL LOW (ref 150–400)
RBC: 2.6 MIL/uL — ABNORMAL LOW (ref 3.87–5.11)
RDW: 18.6 % — ABNORMAL HIGH (ref 11.5–15.5)
WBC: 3.2 K/uL — ABNORMAL LOW (ref 4.0–10.5)
nRBC: 0 % (ref 0.0–0.2)

## 2024-03-16 LAB — COMPREHENSIVE METABOLIC PANEL WITH GFR
ALT: 99 U/L — ABNORMAL HIGH (ref 0–44)
AST: 88 U/L — ABNORMAL HIGH (ref 15–41)
Albumin: 2.3 g/dL — ABNORMAL LOW (ref 3.5–5.0)
Alkaline Phosphatase: 225 U/L — ABNORMAL HIGH (ref 38–126)
Anion gap: 9 (ref 5–15)
BUN: 19 mg/dL (ref 8–23)
CO2: 24 mmol/L (ref 22–32)
Calcium: 7.7 mg/dL — ABNORMAL LOW (ref 8.9–10.3)
Chloride: 99 mmol/L (ref 98–111)
Creatinine, Ser: 0.87 mg/dL (ref 0.44–1.00)
GFR, Estimated: >60 mL/min (ref >60)
Glucose, Bld: 107 mg/dL — ABNORMAL HIGH (ref 70–99)
Potassium: 3.6 mmol/L (ref 3.5–5.1)
Sodium: 131 mmol/L — ABNORMAL LOW (ref 135–145)
Total Bilirubin: 0.6 mg/dL (ref 0.0–1.2)
Total Protein: 4.9 g/dL — ABNORMAL LOW (ref 6.5–8.1)

## 2024-03-16 LAB — FERRITIN: Ferritin: 885 ng/mL — ABNORMAL HIGH (ref 11–307)

## 2024-03-16 LAB — RETICULOCYTES
Immature Retic Fract: 14.8 % (ref 2.3–15.9)
RBC.: 2.35 MIL/uL — ABNORMAL LOW (ref 3.87–5.11)
Retic Count, Absolute: 22.1 K/uL (ref 19.0–186.0)
Retic Ct Pct: 0.9 % (ref 0.4–3.1)

## 2024-03-16 LAB — PREPARE RBC (CROSSMATCH)

## 2024-03-16 LAB — FOLATE: Folate: 9.9 ng/mL (ref >5.9)

## 2024-03-16 LAB — VITAMIN B12: Vitamin B-12: 2845 pg/mL — ABNORMAL HIGH (ref 180–914)

## 2024-03-16 MED ORDER — SODIUM CHLORIDE 0.9 % IV SOLN
2.0000 g | Freq: Two times a day (BID) | INTRAVENOUS | Status: DC
  Administered 2024-03-16: 2 g via INTRAVENOUS
  Filled 2024-03-16: qty 12.5

## 2024-03-16 MED ORDER — ALBUMIN HUMAN 25 % IV SOLN
25.0000 g | Freq: Once | INTRAVENOUS | Status: AC
  Administered 2024-03-16: 25 g via INTRAVENOUS
  Filled 2024-03-16: qty 100

## 2024-03-16 MED ORDER — CEFEPIME HCL 2 G IV SOLR
2.0000 g | Freq: Three times a day (TID) | INTRAVENOUS | Status: DC
  Administered 2024-03-16 – 2024-03-17 (×3): 2 g via INTRAVENOUS
  Filled 2024-03-16 (×3): qty 12.5

## 2024-03-16 MED ORDER — SODIUM CHLORIDE 0.9% IV SOLUTION: Freq: Once | INTRAVENOUS | Status: AC

## 2024-03-16 NOTE — Progress Notes (Signed)
 PROGRESS NOTE    Nicole Johnson  FMW:[id number] DOB: 06-Mar-1961 DOA: 03/15/2024 PCP: Randeen Laine DELENA, MD    Brief Narrative:   Nicole Johnson is a 63 y.o. female with past medical history significant for metastatic pancreatic adenocarcinoma on chemotherapy (follows with [facility]), pancytopenia, cirrhosis with ascites, GERD who presented to [facility] ED on 03/15/2024 with complaints of generalized weakness, back pain.  Patient reports day prior paracentesis with 3 L of fluid removed from her abdomen by interventional radiology; reported tolerated well.  But thereafter she started having worsening right posterior flank pain with generalized weakness and thought that she was becoming anemic as she has had  the symptoms in the past.  Denies any fevers, no anterior abdominal pain, no nausea/vomiting.  In the ED, temperature 99.1 F, HR 113, RR 18, BP 98/70, SpO2 100% on room air.  WBC 3.1, hemoglobin 8.9, platelet count 78.  Sodium 132, potassium 2.6, chloride 99, CO2 24, glucose 108, BUN 15, creatinine 1.04.  Lipase less than 10.  AST 144, ALT 115, total bilirubin 1.0.  Lactic acid 1.2.  COVID/influenza/RSV PCR negative.  Chest x-ray with no acute cardiopulmonary disease process.  CT abdomen/pelvis with poorly defined infiltrative proximal pancreatic mass with vascular involvement as seen previously, decreased size thick walled cystic lesion right hepatic lobe near porta hepatis, interim development multiple cystic appearing lesions posterior right hepatic lobe largest measuring 2.6 cm concerning for abscess versus metastatic disease, indwelling, duct stent with gas in the stent at the porta hepatis but mostly opacified appearance compared to prior study with some pneumobilia, abnormal masslike thickening pylorus and duodenal bulb inflammatory versus infiltrated/neoplastic involvement, colon wall thickening ascending/transverse/descending and proximal sigmoid colon suspicious for enterocolitis, moderate volume ascites,  generalized subcutaneous edema, splenomegaly.  [facility] was consulted for admission.   Assessment & Plan:   Klebsiella aerogenes bacteremia Hepatic abscess versus metastatic lesion Patient presenting with abdominal pain after recent paracentesis day prior by interventional radiology.  Patient with elevated temperature 99.1 F, tachycardic, hypotensive.  WBC count 3.1, although pancytopenic secondary to chemotherapy.  Chest x-ray negative.  CT abdomen/pelvis with findings concerning for new cystic-appearing lesions to the right hepatic lobe largest measuring 2.6 cm concerning for abscess versus metastatic disease. -- Gastroenterology following, appreciate assistance -- Blood Cultures: 3/4 + GNRs; BCID + Klebsiella aerogenes; awaiting further identification/susceptibilities -- Cefepime 2 g IV every 8 hours -- CBC in a.m.  Hypotension Patient currently on Aldactone  and furosemide  for management of her ascites.  Patient was notably hypotensive on admission.  Received IV albumin. -- Continue to hold home albumin/furosemide  for now -- Monitor BP closely -- Repeat IV albumin as needed  History of malignant pancreatic adenocarcinoma Hepatic abscess versus metastatic lesion Cirrhosis with ascites Transaminitis Patient follows with medical oncology at [facility] and currently undergoing chemotherapy, has also followed with medical oncology locally with Dr. Lanny.  Also follows with GI, Dr. Rollin.  CT imaging notable for poorly defined proximal pancreatic mass with new findings of additional cystic hepatic lesions that are concerning for abscess versus metastasis. -- AST 144>88 -- ALT 115>89 -- Tbili 1.0>0.6 -- CMP in a.m. -- May need further imaging with MR versus IR evaluation regarding hepatic lesions to rule out abscess  Pancytopenia -- Hgb 8.9>7.0 -- Transfuse 1 unit PRBC -- Repeat CBC in a.m.  GERD -- Omeprazole  20 mg p.o. twice daily  DVT prophylaxis: SCDs Start: 03/15/24 1742    Code Status:  Full Code Family Communication: Updated spouse present at bedside this morning  Disposition Plan:  Level of care: Progressive Status is: Inpatient Remains inpatient appropriate because: IV antibiotics    Consultants:  Gastroenterology, Dr. Rollin  Procedures:  None  Antimicrobials:  Cefepime 10/23>>   Subjective: Patient seen examined bedside, lying in bed.  Spouse present at bedside.  Overall feels improved.  Discussed with patient that her hemoglobin dropped to 7.0 and recommend transfusion, agreeable.  Also discussed findings regarding positive blood cultures and need to remain on IV antibiotics.  Seen by GI, Dr. Rollin this afternoon.  No other specific complaints or concerns at this time.  Denies headache, no dizziness, no chest pain, no palpitations, no shortness of breath, no current abdominal pain, no fever/chills/night sweats, no nausea cefonicid diarrhea, no focal weakness, no fatigue, no paresthesia.  No acute events overnight per nurse staff.  Objective: Vitals:   03/16/24 0824 03/16/24 0837 03/16/24 1217 03/16/24 1329  BP: (!) 100/56 (!) 100/56 (!) 86/59 (!) 89/55  Pulse: 91 90 95 86  Resp:  16 17 16   Temp:  98.2 F (36.8 C) 99 F (37.2 C) 98.7 F (37.1 C)  TempSrc:  Oral Oral Oral  SpO2:  97% 98% 98%  Weight:      Height:        Intake/Output Summary (Last 24 hours) at 03/16/2024 1401 Last data filed at 03/15/2024 1820 Gross per 24 hour  Intake 1100 ml  Output --  Net 1100 ml   Filed Weights   03/15/24 2257  Weight: 64.7 kg    Examination:  Physical Exam: GEN: NAD, alert and oriented x 3, chronically ill appearance HEENT: NCAT, PERRL, EOMI, sclera clear, MMM PULM: CTAB w/o wheezes/crackles, normal respiratory effort, on room air CV: RRR w/o M/G/R GI: abd soft, NTND, + BS MSK: no peripheral edema, moves all extremity independently NEURO: No focal neurological deficit PSYCH: normal mood/affect Integumentary: No concerning rashes/lesions/wounds none  exposed and surfaces    Data Reviewed: I have personally reviewed following labs and imaging studies  CBC: Recent Labs  Lab 03/15/24 1338 03/16/24 0557  WBC 3.1* 3.2*  NEUTROABS 2.7  --   HGB 8.9* 7.0*  HCT 29.7* 23.0*  MCV 87.6 88.5  PLT 78* 82*   Basic Metabolic Panel: Recent Labs  Lab 03/15/24 1338 03/16/24 0557  NA 132* 131*  K 2.6* 3.6  CL 98 99  CO2 24 24  GLUCOSE 108* 107*  BUN 15 19  CREATININE 1.04* 0.87  CALCIUM 8.0* 7.7*  MG 1.7  --    GFR: Estimated Creatinine Clearance: 62.8 mL/min (by C-G formula based on SCr of 0.87 mg/dL). Liver Function Tests: Recent Labs  Lab 03/15/24 1338 03/16/24 0557  AST 144* 88*  ALT 115* 99*  ALKPHOS 274* 225*  BILITOT 1.0 0.6  PROT 5.2* 4.9*  ALBUMIN 2.5* 2.3*   Recent Labs  Lab 03/15/24 1338  LIPASE <10*   No results for input(s): AMMONIA in the last 168 hours. Coagulation Profile: No results for input(s): INR, PROTIME in the last 168 hours. Cardiac Enzymes: No results for input(s): CKTOTAL, CKMB, CKMBINDEX, TROPONINI in the last 168 hours. BNP (last 3 results) Recent Labs    04/08/23 0928  PROBNP 93.0   HbA1C: No results for input(s): HGBA1C in the last 72 hours. CBG: No results for input(s): GLUCAP in the last 168 hours. Lipid Profile: No results for input(s): CHOL, HDL, LDLCALC, TRIG, CHOLHDL, LDLDIRECT in the last 72 hours. Thyroid  Function Tests: No results for input(s): TSH, T4TOTAL, FREET4, T3FREE, THYROIDAB in the last  72 hours. Anemia Panel: Recent Labs    03/16/24 1134  VITAMINB12 2,845*  FOLATE 9.9  FERRITIN 885*  TIBC 161*  IRON 11*  RETICCTPCT 0.9   Sepsis Labs: Recent Labs  Lab 03/15/24 1350  LATICACIDVEN 1.2    Recent Results (from the past 240 hours)  Blood culture (routine x 2)     Status: None (Preliminary result)   Collection Time: 03/15/24  1:38 PM   Specimen: BLOOD  Result Value Ref Range Status   Specimen Description    Final    BLOOD PORTA CATH Performed at [facility], [address] [place] [address], [place], [place] [address]    Special Requests   Final    BOTTLES DRAWN AEROBIC AND ANAEROBIC Blood Culture adequate volume Performed at [facility], [address] [place], [place] [address]    Culture  Setup Time   Final    GRAM NEGATIVE RODS IN BOTH AEROBIC AND ANAEROBIC BOTTLES L POINTDEXTER PHARM D 03/16/2024 @0237  BY DD Performed at [facility], [address], [place], [place] [address]    Culture GRAM NEGATIVE RODS  Final   Report Status PENDING  Incomplete  Resp panel by RT-PCR (RSV, Flu A&B, Covid) Anterior Nasal Swab     Status: None   Collection Time: 03/15/24  1:38 PM   Specimen: Anterior Nasal Swab  Result Value Ref Range Status   SARS Coronavirus 2 by RT PCR NEGATIVE NEGATIVE Final    Comment: (NOTE) SARS-CoV-2 target nucleic acids are NOT DETECTED.  The SARS-CoV-2 RNA is generally detectable in upper respiratory specimens during the acute phase of infection. The lowest concentration of SARS-CoV-2 viral copies this assay can detect is 138 copies/mL. A negative result does not preclude SARS-Cov-2 infection and should not be used as the sole basis for treatment or other patient management decisions. A negative result may occur with  improper specimen collection/handling, submission of specimen other than nasopharyngeal swab, presence of viral mutation(s) within the areas targeted by this assay, and inadequate number of viral copies(<138 copies/mL). A negative result must be combined with clinical observations, patient history, and epidemiological information. The expected result is Negative.  Fact Sheet for Patients:  BloggerCourse.com  Fact Sheet for Healthcare Providers:  SeriousBroker.it  This test is no t yet approved or cleared by the [place]  FDA and  has been authorized for detection  and/or diagnosis of SARS-CoV-2 by FDA under an Emergency Use Authorization (EUA). This EUA will remain  in effect (meaning this test can be used) for the duration of the COVID-19 declaration under Section 564(b)(1) of the Act, 21 U.S.C.section 360bbb-3(b)(1), unless the authorization is terminated  or revoked sooner.       Influenza A by PCR NEGATIVE NEGATIVE Final   Influenza B by PCR NEGATIVE NEGATIVE Final    Comment: (NOTE) The Xpert Xpress SARS-CoV-2/FLU/RSV plus assay is intended as an aid in the diagnosis of influenza from Nasopharyngeal swab specimens and should not be used as a sole basis for treatment. Nasal washings and aspirates are unacceptable for Xpert Xpress SARS-CoV-2/FLU/RSV testing.  Fact Sheet for Patients: BloggerCourse.com  Fact Sheet for Healthcare Providers: SeriousBroker.it  This test is not yet approved or cleared by the [place]  FDA and has been authorized for detection and/or diagnosis of SARS-CoV-2 by FDA under an Emergency Use Authorization (EUA). This EUA will remain in effect (meaning this test can be used) for the duration of the COVID-19 declaration under Section 564(b)(1) of the Act,  21 U.S.C. section 360bbb-3(b)(1), unless the authorization is terminated or revoked.     Resp Syncytial Virus by PCR NEGATIVE NEGATIVE Final    Comment: (NOTE) Fact Sheet for Patients: BloggerCourse.com  Fact Sheet for Healthcare Providers: SeriousBroker.it  This test is not yet approved or cleared by the [place]  FDA and has been authorized for detection and/or diagnosis of SARS-CoV-2 by FDA under an Emergency Use Authorization (EUA). This EUA will remain in effect (meaning this test can be used) for the duration of the COVID-19 declaration under Section 564(b)(1) of the Act, 21 U.S.C. section 360bbb-3(b)(1), unless the authorization is terminated  or revoked.  Performed at [facility], [address] [place], [place] [address]   Blood Culture ID Panel (Reflexed)     Status: Abnormal   Collection Time: 03/15/24  1:38 PM  Result Value Ref Range Status   Enterococcus faecalis NOT DETECTED NOT DETECTED Final   Enterococcus Faecium NOT DETECTED NOT DETECTED Final   Listeria monocytogenes NOT DETECTED NOT DETECTED Final   Staphylococcus species NOT DETECTED NOT DETECTED Final   Staphylococcus aureus (BCID) NOT DETECTED NOT DETECTED Final   Staphylococcus epidermidis NOT DETECTED NOT DETECTED Final   Staphylococcus lugdunensis NOT DETECTED NOT DETECTED Final   Streptococcus species NOT DETECTED NOT DETECTED Final   Streptococcus agalactiae NOT DETECTED NOT DETECTED Final   Streptococcus pneumoniae NOT DETECTED NOT DETECTED Final   Streptococcus pyogenes NOT DETECTED NOT DETECTED Final   A.calcoaceticus-baumannii NOT DETECTED NOT DETECTED Final   Bacteroides fragilis NOT DETECTED NOT DETECTED Final   Enterobacterales DETECTED (A) NOT DETECTED Final    Comment: Enterobacterales represent a large order of gram negative bacteria, not a single organism. CRITICAL RESULT CALLED TO, READ BACK BY AND VERIFIED WITH: L POINTDEXTER PHARM D 03/16/2024 @0237  BY DD    Enterobacter cloacae complex NOT DETECTED NOT DETECTED Final   Escherichia coli NOT DETECTED NOT DETECTED Final   Klebsiella aerogenes DETECTED (A) NOT DETECTED Final    Comment: CRITICAL RESULT CALLED TO, READ BACK BY AND VERIFIED WITH: L POINTDEXTER PHARM D 03/16/2024 @0237  BY DD    Klebsiella oxytoca NOT DETECTED NOT DETECTED Final   Klebsiella pneumoniae NOT DETECTED NOT DETECTED Final   Proteus species NOT DETECTED NOT DETECTED Final   Salmonella species NOT DETECTED NOT DETECTED Final   Serratia marcescens NOT DETECTED NOT DETECTED Final   Haemophilus influenzae NOT DETECTED NOT DETECTED Final   Neisseria meningitidis NOT DETECTED NOT DETECTED Final    Pseudomonas aeruginosa NOT DETECTED NOT DETECTED Final   Stenotrophomonas maltophilia NOT DETECTED NOT DETECTED Final   Candida albicans NOT DETECTED NOT DETECTED Final   Candida auris NOT DETECTED NOT DETECTED Final   Candida glabrata NOT DETECTED NOT DETECTED Final   Candida krusei NOT DETECTED NOT DETECTED Final   Candida parapsilosis NOT DETECTED NOT DETECTED Final   Candida tropicalis NOT DETECTED NOT DETECTED Final   Cryptococcus neoformans/gattii NOT DETECTED NOT DETECTED Final   CTX-M ESBL NOT DETECTED NOT DETECTED Final   Carbapenem resistance IMP NOT DETECTED NOT DETECTED Final   Carbapenem resistance KPC NOT DETECTED NOT DETECTED Final   Carbapenem resistance NDM NOT DETECTED NOT DETECTED Final   Carbapenem resist OXA 48 LIKE NOT DETECTED NOT DETECTED Final   Carbapenem resistance VIM NOT DETECTED NOT DETECTED Final    Comment: Performed at [facility], [address], [place], [place] [address]  Blood culture (routine x 2)     Status: None (Preliminary result)  Collection Time: 03/15/24  2:55 PM   Specimen: BLOOD  Result Value Ref Range Status   Specimen Description   Final    BLOOD BLOOD RIGHT ARM Performed at [facility], [address] [place], [place] [address]    Special Requests   Final    Blood Culture adequate volume BOTTLES DRAWN AEROBIC AND ANAEROBIC Performed at [facility] [address] [place], [place] [address]    Culture  Setup Time   Final    GRAM NEGATIVE RODS ANAEROBIC BOTTLE ONLY CRITICAL VALUE NOTED.  VALUE IS CONSISTENT WITH PREVIOUSLY REPORTED AND CALLED VALUE. Performed at [facility], [address] [place], [place] [address]    Culture GRAM NEGATIVE RODS  Final   Report Status PENDING  Incomplete         Radiology Studies: US  ABDOMEN LIMITED WITH LIVER DOPPLER Result Date: 03/15/2024 CLINICAL DATA:  Pancreatic head mass EXAM: DUPLEX ULTRASOUND OF LIVER TECHNIQUE: Color and  duplex Doppler ultrasound was performed to evaluate the hepatic in-flow and out-flow vessels. COMPARISON:  CT from earlier in the same day. FINDINGS: Liver: The known mass lesion adjacent to the gallbladder fossa is not well appreciated on this exam. Main Portal Vein size: 1.3 cm Portal Vein Velocities Main Prox:  65 cm/sec Right: 25 cm/sec Left: 18 cm/sec Hepatic Vein Velocities Right:  20 cm/sec Middle:  16 cm/sec Left:  28 cm/sec IVC: Present and patent with normal respiratory phasicity. Hepatic Artery Velocity:  51.9 cm/sec Splenic Vein Velocity:  14.7 cm/sec Spleen: 17.7 cm x 0.0 cm x 6.1 cm with a total volume of 395.9 cm^3 (411 cm^3 is upper limit normal) Portal Vein Occlusion/Thrombus: Absent Splenic Vein Occlusion/Thrombus: Absent Ascites: Present Varices: None IMPRESSION: No portal/hepatic venous occlusion is noted. No arterial abnormality is seen. The known pancreatic head mass is not as well appreciated as on recent CT. Known liver metastatic lesion is not well appreciated on this exam. Ascites is seen similar to that noted on prior CT. Spleen is at the upper limits of normal in size. Electronically Signed   By: Oneil Devonshire M.D.   On: 03/15/2024 19:51   CT ABDOMEN PELVIS W CONTRAST Result Date: 03/15/2024 CLINICAL DATA:  Recent paracentesis history of pancreas cancer worsening abdominal pain EXAM: CT ABDOMEN AND PELVIS WITH CONTRAST TECHNIQUE: Multidetector CT imaging of the abdomen and pelvis was performed using the standard protocol following bolus administration of intravenous contrast. RADIATION DOSE REDUCTION: This exam was performed according to the departmental dose-optimization program which includes automated exposure control, adjustment of the mA and/or kV according to patient size and/or use of iterative reconstruction technique. CONTRAST:  OMNIPAQUE  IOHEXOL  300 MG/ML  SOLN COMPARISON:  CT 10/08/2023, 04/02/2023, 01/11/2023, MRI 12/16/2022 FINDINGS: Lower chest: Lung bases  demonstrate no acute airspace disease. Vascular catheter tip at the cavoatrial junction. Hepatobiliary: Trace pneumobilia. Indwelling common duct stent with some gas in the stent at the porta hepatis but mostly opacified appearance of the stent compared to the prior CT from May. Slight interval decrease in size of a hypodense low-density mass near the gallbladder fossa, today measuring 19 mm on series 2, image 24, previously 4.5 cm. Contracted gallbladder without calcified stone. Interim development of multiple cystic appearing lesions in the posterior right hepatic lobe, largest measuring up to 2.6 cm on series 2, image 19. Focal wedge-shaped hypo density within the posterior right hepatic lobe extending to the subcapsular surface, series 2, image 17. Pancreas: Poorly defined infiltrative proximal  pancreatic mass as was seen on prior exams, difficult to measure given infiltrative appearance. Vascular involvement as was seen on prior, involving the proximal SMA, celiac bifurcation and portal splenic confluence. Intra hepatic portal vessels are patent. Spleen: Enlarged, craniocaudal measurement of 19 cm. Adrenals/Urinary Tract: Adrenal glands are normal. Kidneys show no significant hydronephrosis. The bladder is distended Stomach/Bowel: Abnormal masslike thickening of the pylorus and duodenal bulb, series 2, image 25 through 27. Fluid-filled loops of small bowel with mucosal enhancement but no obstructive features. Colon wall thickening, mucosal enhancement and indistinct appearance of the ascending, transverse, descending and proximal sigmoid colon. Moderate stool in the rectosigmoid colon. Vascular/Lymphatic: Mild atherosclerosis.  No aneurysm. Reproductive: Uterus unremarkable.  No adnexal mass Other: No free air. Moderate volume ascites within the abdomen and pelvis. Generalized subcutaneous edema. Musculoskeletal: Multilevel degenerative change. No acute osseous abnormality. IMPRESSION: 1. Poorly defined  infiltrative proximal pancreatic mass with vascular involvement as was seen on prior exams and corresponding to the history of malignancy. 2. Decreased size of previously noted thick walled cystic lesion in the right hepatic lobe near the porta hepatis however interim development of multiple cystic appearing lesions in the posterior right hepatic lobe, largest measuring up to 2.6 cm. Findings could be secondary to intra hepatic abscess versus metastatic disease. Somewhat wedge-shaped area of hypoattenuation adjacent to the multi cystic changes in the right hepatic lobe which may be due to altered perfusion. 3. Indwelling common duct stent with some gas in the stent at the porta hepatis but mostly opacified appearance of the stent compared to the prior CT from May. Some pneumobilia is present. 4. Abnormal masslike thickening of the pylorus and duodenal bulb, this could be inflammatory but infiltrative process/neoplastic involvement is also a consideration. 5. Fluid-filled loops of small bowel with mucosal enhancement but no obstructive features. Colon wall thickening, mucosal enhancement and indistinct appearance of the ascending, transverse, descending and proximal sigmoid colon. Findings are suspicious for enterocolitis. 6. Moderate volume ascites within the abdomen and pelvis. Generalized subcutaneous edema. 7. Splenomegaly. 8. Aortic atherosclerosis. Aortic Atherosclerosis (ICD10-I70.0). Electronically Signed   By: Luke Bun M.D.   On: 03/15/2024 16:24   DG Chest Portable 1 View Result Date: 03/15/2024 EXAM: 1 VIEW(S) XRAY OF THE CHEST 03/15/2024 01:40:46 PM COMPARISON: CT chest 01/11/2023 CLINICAL HISTORY: gen weakness, h/o cancer. General weakness, hx cancer FINDINGS: LINES, TUBES AND DEVICES: Right CT Port-A-Cath in place with tip at cavoatrial junction. LUNGS AND PLEURA: Low lung volumes. Elevated right hemidiaphragm. No focal pulmonary opacity. No pulmonary edema. No pleural effusion. No  pneumothorax. HEART AND MEDIASTINUM: No acute abnormality of the cardiac and mediastinal silhouettes. BONES AND SOFT TISSUES: No acute osseous abnormality. IMPRESSION: 1. No acute cardiopulmonary findings. 2. Low lung volumes with mildly elevated right hemidiaphragm. Electronically signed by: Waddell Calk MD 03/15/2024 02:17 PM EDT RP Workstation: [id number]   US  Paracentesis Result Date: 03/14/2024 INDICATION: ASCITES Patient with history of metastatic pancreatic cancer with recurrent malignant ascites. Request received for therapeutic paracentesis. EXAM: ULTRASOUND GUIDED THERAPEUTIC PARACENTESIS MEDICATIONS: 8 mL 1% lidocaine  with epinephrine  to skin/subcutaneous tissue COMPLICATIONS: None immediate. PROCEDURE: Informed written consent was obtained from the patient after a discussion of the risks, benefits and alternatives to treatment. A timeout was performed prior to the initiation of the procedure. Initial ultrasound scanning demonstrates a moderate amount of ascites within the left mid to lower abdominal quadrant. The left mid to lower abdomen was prepped and draped in the usual sterile fashion. 1% lidocaine  was used for local anesthesia.  Following this, a 19 gauge, 10-cm, Yueh catheter was introduced. An ultrasound image was saved for documentation purposes. The paracentesis was performed. The catheter was removed and a dressing was applied. The patient tolerated the procedure well without immediate post procedural complication. FINDINGS: A total of approximately 3.0 L of yellow fluid was removed. IMPRESSION: Successful ultrasound-guided therapeutic paracentesis yielding 3.0 L of peritoneal fluid. Performed by: Franky Rakers , PA-C under direct supervision of Thom Hall, MD Electronically Signed   By: Thom Hall M.D.   On: 03/14/2024 16:39        Scheduled Meds:  Chlorhexidine  Gluconate Cloth  6 each Topical Daily   furosemide   40 mg Oral q AM   omeprazole   20 mg Oral BID AC   sodium  chloride flush  10-40 mL Intracatheter Q12H   spironolactone   100 mg Oral Daily   Continuous Infusions:  ceFEPime (MAXIPIME) IV 2 g (03/16/24 1101)     LOS: 0 days    Time spent: 53 minutes spent on 03/16/2024 caring for this patient face-to-face including chart review, ordering labs/tests, documenting, discussion with nursing staff, consultants, updating family and interview/physical exam    Camellia PARAS [place], DO [facility] Available via Epic secure chat 7am-7pm After these hours, please refer to coverage provider listed on [email or web address] 03/16/2024, 2:01 PM

## 2024-03-16 NOTE — Consult Note (Signed)
 Reason for Consult: Abdominal pain, back pain, and elevated liver enzymes Referring Physician: [facility]  Nicole Johnson HPI: This is a 63 year old female with a PMH of pancreatic adenocarcinoma in the head of the pancreas s/p metallic stent placement at [facility] and cirrhosis admitted for complaints of left lower back pain s/p paracentesis.  On 03/14/2024 she had a routine paracentesis for abdominal discomfort.  Three liters of ascitic fluid were removed and she started to develop left sided abdominal pain with radiation to the back.  Unlike her prior paracenteses, the left side of the abdomen was accessed.  After the procedure she had the anticipated lower abdominal pain and then she started to experience left lower back pain.  With the severity of the pain she presented to the ER.  Blood work showed that she was hypokalemic.  Imaging showed that there was air in the metallic biliary stent, but there was other material.  New cysts were noted in the right lobe of the liver and it was not clear if this was as a result of mets or hepatic abscesses.  Her liver enzymes were elevated on admission:  AST 144, ALT 115, TB 1.0, and AP 274.  Overnight, with repletion of her potassium she reported a resolution of the back pain and abdominal pain.  Her liver enzymes also improved.  She was started on cefepime and her blood cultures are pending.  Her last chemotherapy at [facility] was 1.5 weeks ago.  Past Medical History:  Diagnosis Date   Arthritis    Cancer (HCC)    pancreatic   GERD (gastroesophageal reflux disease)    PONV (postoperative nausea and vomiting)    Prediabetes     Past Surgical History:  Procedure Laterality Date   BILIARY BRUSHING  12/17/2022   Procedure: BILIARY BRUSHING;  Surgeon: Rollin Dover, MD;  Location: [facility];  Service: Gastroenterology;;   BILIARY STENT PLACEMENT N/A 12/17/2022   Procedure: BILIARY STENT PLACEMENT;  Surgeon: Rollin Dover, MD;  Location: [facility];  Service:  Gastroenterology;  Laterality: N/A;   BILIARY STENT PLACEMENT N/A 04/05/2023   Procedure: BILIARY STENT PLACEMENT;  Surgeon: Rollin Dover, MD;  Location: [facility];  Service: Gastroenterology;  Laterality: N/A;   COLONOSCOPY N/A 10/11/2023   Procedure: COLONOSCOPY;  Surgeon: Rollin Dover, MD;  Location: [facility];  Service: Gastroenterology;  Laterality: N/A;   ERCP N/A 12/17/2022   Procedure: ENDOSCOPIC RETROGRADE CHOLANGIOPANCREATOGRAPHY (ERCP);  Surgeon: Rollin Dover, MD;  Location: [facility];  Service: Gastroenterology;  Laterality: N/A;   ERCP N/A 04/05/2023   Procedure: ENDOSCOPIC RETROGRADE CHOLANGIOPANCREATOGRAPHY (ERCP);  Surgeon: Rollin Dover, MD;  Location: [facility];  Service: Gastroenterology;  Laterality: N/A;   ESOPHAGOGASTRODUODENOSCOPY N/A 10/11/2023   Procedure: EGD (ESOPHAGOGASTRODUODENOSCOPY);  Surgeon: Rollin Dover, MD;  Location: [facility];  Service: Gastroenterology;  Laterality: N/A;   ESOPHAGOGASTRODUODENOSCOPY (EGD) WITH PROPOFOL  N/A 12/18/2022   Procedure: ESOPHAGOGASTRODUODENOSCOPY (EGD) WITH PROPOFOL ;  Surgeon: Rollin Dover, MD;  Location: [facility];  Service: Gastroenterology;  Laterality: N/A;   ESOPHAGOGASTRODUODENOSCOPY (EGD) WITH PROPOFOL  N/A 12/30/2022   Procedure: ESOPHAGOGASTRODUODENOSCOPY (EGD) WITH PROPOFOL ;  Surgeon: Rollin Dover, MD;  Location: [facility];  Service: Gastroenterology;  Laterality: N/A;   EUS N/A 12/18/2022   Procedure: UPPER ENDOSCOPIC ULTRASOUND (EUS) LINEAR;  Surgeon: Rollin Dover, MD;  Location: [facility];  Service: Gastroenterology;  Laterality: N/A;   FINE NEEDLE ASPIRATION N/A 12/18/2022   Procedure: FINE NEEDLE ASPIRATION (FNA) LINEAR;  Surgeon: Rollin Dover, MD;  Location: [facility];  Service: Gastroenterology;  Laterality: N/A;   FINE NEEDLE ASPIRATION  12/30/2022   Procedure: FINE NEEDLE ASPIRATION (FNA) LINEAR;  Surgeon: Rollin Dover, MD;  Location: [facility] ENDOSCOPY;  Service: Gastroenterology;;   IR  PARACENTESIS  01/17/2024   IR PARACENTESIS  02/15/2024   LAPAROSCOPIC ABDOMINAL EXPLORATION     SPHINCTEROTOMY  12/17/2022   Procedure: ANNETT;  Surgeon: Rollin Dover, MD;  Location: [facility];  Service: Gastroenterology;;   CLEDA REMOVAL  04/05/2023   Procedure: STENT REMOVAL;  Surgeon: Rollin Dover, MD;  Location: [facility];  Service: Gastroenterology;;   UPPER ESOPHAGEAL ENDOSCOPIC ULTRASOUND (EUS) N/A 12/30/2022   Procedure: UPPER ESOPHAGEAL ENDOSCOPIC ULTRASOUND (EUS);  Surgeon: Rollin Dover, MD;  Location: [facility] ENDOSCOPY;  Service: Gastroenterology;  Laterality: N/A;    Family History  Problem Relation Age of Onset   Diabetes Mother    Stroke Mother    Lung cancer Father 50       smoked   Diabetes Father    Diabetes Sister    Breast cancer Sister 22   Diabetes Brother    Cancer Brother 60       neck cancer   Heart disease Maternal Grandmother    Breast cancer Maternal Grandmother        dx. <50, double mastectomy   Heart disease Maternal Grandfather     Social History:  reports that she has never smoked. She has never used smokeless tobacco. She reports that she does not currently use alcohol . She reports that she does not use drugs.  Allergies:  Allergies  Allergen Reactions   Other Nausea Only and Other (See Comments)    Certain types of Anesthesia cause SEVERE NAUSEA   Codeine Nausea Only   Hydrocodone -Acetaminophen  Nausea Only and Other (See Comments)    Reaction unconfirmed,ed/might be nausea   Meloxicam Nausea Only   Tramadol Nausea Only    Medications: Scheduled:  Chlorhexidine  Gluconate Cloth  6 each Topical Daily   furosemide   40 mg Oral q AM   omeprazole   20 mg Oral BID AC   sodium chloride  flush  10-40 mL Intracatheter Q12H   spironolactone   100 mg Oral Daily   Continuous:  ceFEPime (MAXIPIME) IV 2 g (03/16/24 1101)    Results for orders placed or performed during the hospital encounter of 03/15/24 (from the past 24 hours)  Blood  culture (routine x 2)     Status: None (Preliminary result)   Collection Time: 03/15/24  2:55 PM   Specimen: BLOOD  Result Value Ref Range   Specimen Description      BLOOD BLOOD RIGHT ARM Performed at [facility], [address] [place], [place] [address]    Special Requests      Blood Culture adequate volume BOTTLES DRAWN AEROBIC AND ANAEROBIC Performed at [facility], [address] [place], [place] [address]    Culture  Setup Time      GRAM NEGATIVE RODS ANAEROBIC BOTTLE ONLY CRITICAL VALUE NOTED.  VALUE IS CONSISTENT WITH PREVIOUSLY REPORTED AND CALLED VALUE. Performed at [facility], [address] [place], [place] [address]    Culture GRAM NEGATIVE RODS    Report Status PENDING   CBC     Status: Abnormal   Collection Time: 03/16/24  5:57 AM  Result Value Ref Range   WBC 3.2 (L) 4.0 - 10.5 K/uL   RBC 2.60 (L) 3.87 - 5.11 MIL/uL   Hemoglobin 7.0 (L) 12.0 - 15.0 g/dL   HCT 76.9 (L) 63.9 -  46.0 %   MCV 88.5 80.0 - 100.0 fL   MCH 26.9 26.0 - 34.0 pg   MCHC 30.4 30.0 - 36.0 g/dL   RDW 81.3 (H) 88.4 - 84.4 %   Platelets 82 (L) 150 - 400 K/uL   nRBC 0.0 0.0 - 0.2 %  Comprehensive metabolic panel with GFR     Status: Abnormal   Collection Time: 03/16/24  5:57 AM  Result Value Ref Range   Sodium 131 (L) 135 - 145 mmol/L   Potassium 3.6 3.5 - 5.1 mmol/L   Chloride 99 98 - 111 mmol/L   CO2 24 22 - 32 mmol/L   Glucose, Bld 107 (H) 70 - 99 mg/dL   BUN 19 8 - 23 mg/dL   Creatinine, Ser 9.12 0.44 - 1.00 mg/dL   Calcium 7.7 (L) 8.9 - 10.3 mg/dL   Total Protein 4.9 (L) 6.5 - 8.1 g/dL   Albumin 2.3 (L) 3.5 - 5.0 g/dL   AST 88 (H) 15 - 41 U/L   ALT 99 (H) 0 - 44 U/L   Alkaline Phosphatase 225 (H) 38 - 126 U/L   Total Bilirubin 0.6 0.0 - 1.2 mg/dL   GFR, Estimated >39 >39 mL/min   Anion gap 9 5 - 15  Vitamin B12     Status: Abnormal   Collection Time: 03/16/24 11:34 AM  Result Value Ref Range   Vitamin B-12 2,845 (H) 180 - 914  pg/mL  Folate     Status: None   Collection Time: 03/16/24 11:34 AM  Result Value Ref Range   Folate 9.9 >5.9 ng/mL  Iron and TIBC     Status: Abnormal   Collection Time: 03/16/24 11:34 AM  Result Value Ref Range   Iron 11 (L) 28 - 170 ug/dL   TIBC 838 (L) 749 - 549 ug/dL   Saturation Ratios 7 (L) 10.4 - 31.8 %   UIBC 150 ug/dL  Ferritin     Status: Abnormal   Collection Time: 03/16/24 11:34 AM  Result Value Ref Range   Ferritin 885 (H) 11 - 307 ng/mL  Reticulocytes     Status: Abnormal   Collection Time: 03/16/24 11:34 AM  Result Value Ref Range   Retic Ct Pct 0.9 0.4 - 3.1 %   RBC. 2.35 (L) 3.87 - 5.11 MIL/uL   Retic Count, Absolute 22.1 19.0 - 186.0 K/uL   Immature Retic Fract 14.8 2.3 - 15.9 %  Prepare RBC (crossmatch)     Status: None   Collection Time: 03/16/24 11:34 AM  Result Value Ref Range   Order Confirmation      ORDER PROCESSED BY BLOOD BANK Performed at [facility], [address] [place], [place] [address]      US  ABDOMEN LIMITED WITH LIVER DOPPLER Result Date: 03/15/2024 CLINICAL DATA:  Pancreatic head mass EXAM: DUPLEX ULTRASOUND OF LIVER TECHNIQUE: Color and duplex Doppler ultrasound was performed to evaluate the hepatic in-flow and out-flow vessels. COMPARISON:  CT from earlier in the same day. FINDINGS: Liver: The known mass lesion adjacent to the gallbladder fossa is not well appreciated on this exam. Main Portal Vein size: 1.3 cm Portal Vein Velocities Main Prox:  65 cm/sec Right: 25 cm/sec Left: 18 cm/sec Hepatic Vein Velocities Right:  20 cm/sec Middle:  16 cm/sec Left:  28 cm/sec IVC: Present and patent with normal respiratory phasicity. Hepatic Artery Velocity:  51.9 cm/sec Splenic Vein Velocity:  14.7 cm/sec Spleen: 17.7 cm x 0.0 cm x 6.1 cm with  a total volume of 395.9 cm^3 (411 cm^3 is upper limit normal) Portal Vein Occlusion/Thrombus: Absent Splenic Vein Occlusion/Thrombus: Absent Ascites: Present Varices: None IMPRESSION: No  portal/hepatic venous occlusion is noted. No arterial abnormality is seen. The known pancreatic head mass is not as well appreciated as on recent CT. Known liver metastatic lesion is not well appreciated on this exam. Ascites is seen similar to that noted on prior CT. Spleen is at the upper limits of normal in size. Electronically Signed   By: Oneil Devonshire M.D.   On: 03/15/2024 19:51   CT ABDOMEN PELVIS W CONTRAST Result Date: 03/15/2024 CLINICAL DATA:  Recent paracentesis history of pancreas cancer worsening abdominal pain EXAM: CT ABDOMEN AND PELVIS WITH CONTRAST TECHNIQUE: Multidetector CT imaging of the abdomen and pelvis was performed using the standard protocol following bolus administration of intravenous contrast. RADIATION DOSE REDUCTION: This exam was performed according to the departmental dose-optimization program which includes automated exposure control, adjustment of the mA and/or kV according to patient size and/or use of iterative reconstruction technique. CONTRAST:  OMNIPAQUE  IOHEXOL  300 MG/ML  SOLN COMPARISON:  CT 10/08/2023, 04/02/2023, 01/11/2023, MRI 12/16/2022 FINDINGS: Lower chest: Lung bases demonstrate no acute airspace disease. Vascular catheter tip at the cavoatrial junction. Hepatobiliary: Trace pneumobilia. Indwelling common duct stent with some gas in the stent at the porta hepatis but mostly opacified appearance of the stent compared to the prior CT from May. Slight interval decrease in size of a hypodense low-density mass near the gallbladder fossa, today measuring 19 mm on series 2, image 24, previously 4.5 cm. Contracted gallbladder without calcified stone. Interim development of multiple cystic appearing lesions in the posterior right hepatic lobe, largest measuring up to 2.6 cm on series 2, image 19. Focal wedge-shaped hypo density within the posterior right hepatic lobe extending to the subcapsular surface, series 2, image 17. Pancreas: Poorly defined infiltrative  proximal pancreatic mass as was seen on prior exams, difficult to measure given infiltrative appearance. Vascular involvement as was seen on prior, involving the proximal SMA, celiac bifurcation and portal splenic confluence. Intra hepatic portal vessels are patent. Spleen: Enlarged, craniocaudal measurement of 19 cm. Adrenals/Urinary Tract: Adrenal glands are normal. Kidneys show no significant hydronephrosis. The bladder is distended Stomach/Bowel: Abnormal masslike thickening of the pylorus and duodenal bulb, series 2, image 25 through 27. Fluid-filled loops of small bowel with mucosal enhancement but no obstructive features. Colon wall thickening, mucosal enhancement and indistinct appearance of the ascending, transverse, descending and proximal sigmoid colon. Moderate stool in the rectosigmoid colon. Vascular/Lymphatic: Mild atherosclerosis.  No aneurysm. Reproductive: Uterus unremarkable.  No adnexal mass Other: No free air. Moderate volume ascites within the abdomen and pelvis. Generalized subcutaneous edema. Musculoskeletal: Multilevel degenerative change. No acute osseous abnormality. IMPRESSION: 1. Poorly defined infiltrative proximal pancreatic mass with vascular involvement as was seen on prior exams and corresponding to the history of malignancy. 2. Decreased size of previously noted thick walled cystic lesion in the right hepatic lobe near the porta hepatis however interim development of multiple cystic appearing lesions in the posterior right hepatic lobe, largest measuring up to 2.6 cm. Findings could be secondary to intra hepatic abscess versus metastatic disease. Somewhat wedge-shaped area of hypoattenuation adjacent to the multi cystic changes in the right hepatic lobe which may be due to altered perfusion. 3. Indwelling common duct stent with some gas in the stent at the porta hepatis but mostly opacified appearance of the stent compared to the prior CT from May. Some pneumobilia is  present. 4.  Abnormal masslike thickening of the pylorus and duodenal bulb, this could be inflammatory but infiltrative process/neoplastic involvement is also a consideration. 5. Fluid-filled loops of small bowel with mucosal enhancement but no obstructive features. Colon wall thickening, mucosal enhancement and indistinct appearance of the ascending, transverse, descending and proximal sigmoid colon. Findings are suspicious for enterocolitis. 6. Moderate volume ascites within the abdomen and pelvis. Generalized subcutaneous edema. 7. Splenomegaly. 8. Aortic atherosclerosis. Aortic Atherosclerosis (ICD10-I70.0). Electronically Signed   By: Luke Bun M.D.   On: 03/15/2024 16:24   DG Chest Portable 1 View Result Date: 03/15/2024 EXAM: 1 VIEW(S) XRAY OF THE CHEST 03/15/2024 01:40:46 PM COMPARISON: CT chest 01/11/2023 CLINICAL HISTORY: gen weakness, h/o cancer. General weakness, hx cancer FINDINGS: LINES, TUBES AND DEVICES: Right CT Port-A-Cath in place with tip at cavoatrial junction. LUNGS AND PLEURA: Low lung volumes. Elevated right hemidiaphragm. No focal pulmonary opacity. No pulmonary edema. No pleural effusion. No pneumothorax. HEART AND MEDIASTINUM: No acute abnormality of the cardiac and mediastinal silhouettes. BONES AND SOFT TISSUES: No acute osseous abnormality. IMPRESSION: 1. No acute cardiopulmonary findings. 2. Low lung volumes with mildly elevated right hemidiaphragm. Electronically signed by: Waddell Calk MD 03/15/2024 02:17 PM EDT RP Workstation: [id number]   US  Paracentesis Result Date: 03/14/2024 INDICATION: ASCITES Patient with history of metastatic pancreatic cancer with recurrent malignant ascites. Request received for therapeutic paracentesis. EXAM: ULTRASOUND GUIDED THERAPEUTIC PARACENTESIS MEDICATIONS: 8 mL 1% lidocaine  with epinephrine  to skin/subcutaneous tissue COMPLICATIONS: None immediate. PROCEDURE: Informed written consent was obtained from the patient after a discussion of the risks,  benefits and alternatives to treatment. A timeout was performed prior to the initiation of the procedure. Initial ultrasound scanning demonstrates a moderate amount of ascites within the left mid to lower abdominal quadrant. The left mid to lower abdomen was prepped and draped in the usual sterile fashion. 1% lidocaine  was used for local anesthesia. Following this, a 19 gauge, 10-cm, Yueh catheter was introduced. An ultrasound image was saved for documentation purposes. The paracentesis was performed. The catheter was removed and a dressing was applied. The patient tolerated the procedure well without immediate post procedural complication. FINDINGS: A total of approximately 3.0 L of yellow fluid was removed. IMPRESSION: Successful ultrasound-guided therapeutic paracentesis yielding 3.0 L of peritoneal fluid. Performed by: Franky Rakers , PA-C under direct supervision of Thom Hall, MD Electronically Signed   By: Thom Hall M.D.   On: 03/14/2024 16:39    ROS:  As stated above in the HPI otherwise negative.  Blood pressure (!) 89/55, pulse 86, temperature 98.7 F (37.1 C), temperature source Oral, resp. rate 16, height 5' 6 (1.676 m), weight 64.7 kg, last menstrual period 01/11/2011, SpO2 98%.    PE: Gen: NAD, Alert and Oriented HEENT:  [place]/AT, EOMI Neck: Supple, no LAD Lungs: CTA Bilaterally CV: RRR without M/G/R ABD: Soft, NTND, +BS Ext: No C/C/E  Assessment/Plan: 1) Back pain and abdominal pain. 2) Hypokalemia. 3) Cirrhosis. 4) Pancreatic adenocarcinoma. 5) ? Hepatic abscesses.   Clinically she is much improved.  It may be that she was hypokalemic as a result of her chemotherapy and the diuretics for her ascites.  This possibly cause some type of muscular spasm.  She reports feeling much better and a resolution of her pain with repletion of her potassium.  The imaging of the bilary stent does show pneumobilia, but the air is much less compared to an earlier scan.  With the pneumobilia  noted in the stent as well as the intrahepatic  ducts, it is unlikely that she has an obstruction.  The new findings of the cysts are concerning.  They may represent abscesses.  If this is the case, she may need IR drainage, if she does not continue to progress.  Plan: 1) Continue with cefepime. 2) Monitor electrolytes and liver enzymes. 3) Advance to a regular diet. 4) Follow up the blood cultures. 5) Repeat imaging in the near future with regards to the cysts. Iran Rowe D 03/16/2024, 1:52 PM

## 2024-03-16 NOTE — Progress Notes (Signed)
 PHARMACY - PHYSICIAN COMMUNICATION CRITICAL VALUE ALERT - BLOOD CULTURE IDENTIFICATION (BCID)  Nicole Johnson is an 63 y.o. female who presented to [facility] on 03/15/2024 with worsening back pain and abnormal LFTs.  PMH significant for pancreatic cancer, undergoing chemotherapy.  Assessment:  BCID + Klebsiella aerogenes (no ESBL) in 1 out of 4 bottles   Name of physician (or Provider) Contacted: Lavanda Horns, NP  Current antibiotics: none  Changes to prescribed antibiotics recommended:  Recommendations accepted by provider : Begin Cefepime 2g IV q12h  Results for orders placed or performed during the hospital encounter of 03/15/24  Blood Culture ID Panel (Reflexed) (Collected: 03/15/2024  1:38 PM)  Result Value Ref Range   Enterococcus faecalis NOT DETECTED NOT DETECTED   Enterococcus Faecium NOT DETECTED NOT DETECTED   Listeria monocytogenes NOT DETECTED NOT DETECTED   Staphylococcus species NOT DETECTED NOT DETECTED   Staphylococcus aureus (BCID) NOT DETECTED NOT DETECTED   Staphylococcus epidermidis NOT DETECTED NOT DETECTED   Staphylococcus lugdunensis NOT DETECTED NOT DETECTED   Streptococcus species NOT DETECTED NOT DETECTED   Streptococcus agalactiae NOT DETECTED NOT DETECTED   Streptococcus pneumoniae NOT DETECTED NOT DETECTED   Streptococcus pyogenes NOT DETECTED NOT DETECTED   A.calcoaceticus-baumannii NOT DETECTED NOT DETECTED   Bacteroides fragilis NOT DETECTED NOT DETECTED   Enterobacterales DETECTED (A) NOT DETECTED   Enterobacter cloacae complex NOT DETECTED NOT DETECTED   Escherichia coli NOT DETECTED NOT DETECTED   Klebsiella aerogenes DETECTED (A) NOT DETECTED   Klebsiella oxytoca NOT DETECTED NOT DETECTED   Klebsiella pneumoniae NOT DETECTED NOT DETECTED   Proteus species NOT DETECTED NOT DETECTED   Salmonella species NOT DETECTED NOT DETECTED   Serratia marcescens NOT DETECTED NOT DETECTED   Haemophilus influenzae NOT DETECTED NOT DETECTED   Neisseria  meningitidis NOT DETECTED NOT DETECTED   Pseudomonas aeruginosa NOT DETECTED NOT DETECTED   Stenotrophomonas maltophilia NOT DETECTED NOT DETECTED   Candida albicans NOT DETECTED NOT DETECTED   Candida auris NOT DETECTED NOT DETECTED   Candida glabrata NOT DETECTED NOT DETECTED   Candida krusei NOT DETECTED NOT DETECTED   Candida parapsilosis NOT DETECTED NOT DETECTED   Candida tropicalis NOT DETECTED NOT DETECTED   Cryptococcus neoformans/gattii NOT DETECTED NOT DETECTED   CTX-M ESBL NOT DETECTED NOT DETECTED   Carbapenem resistance IMP NOT DETECTED NOT DETECTED   Carbapenem resistance KPC NOT DETECTED NOT DETECTED   Carbapenem resistance NDM NOT DETECTED NOT DETECTED   Carbapenem resist OXA 48 LIKE NOT DETECTED NOT DETECTED   Carbapenem resistance VIM NOT DETECTED NOT DETECTED    Arvin Gauss, PharmD 03/16/2024  3:08 AM

## 2024-03-16 NOTE — Plan of Care (Signed)

## 2024-03-16 NOTE — ED Provider Notes (Signed)
 Clinical Course as of 03/16/24 0009  Thu Mar 15, 2024  1357 Lactic Acid, Venous: 1.2 wnl [HN]  1449 Potassium(!!): 2.6 +hypokalemia [HN]  1449 ALT(!): 115 [HN]  1449 AST(!): 144 Worsening LFTs [HN]  1449 Lipase(!): <10 [HN]  1450 Hemoglobin(!): 8.9 [HN]  1540 BP(!): 93/48 Will give IVF [HN]  1635 CT ABDOMEN PELVIS W CONTRAST 1. Poorly defined infiltrative proximal pancreatic mass with vascular involvement as was seen on prior exams and corresponding to the history of malignancy. 2. Decreased size of previously noted thick walled cystic lesion in the right hepatic lobe near the porta hepatis however interim development of multiple cystic appearing lesions in the posterior right hepatic lobe, largest measuring up to 2.6 cm. Findings could be secondary to intra hepatic abscess versus metastatic disease. Somewhat wedge-shaped area of  hypoattenuation adjacent to the multi cystic changes in the right hepatic lobe which may be due to altered perfusion. 3. Indwelling common duct stent with some gas in the stent at the porta hepatis but mostly opacified appearance of the stent compared to the prior CT from May. Some pneumobilia is present. 4. Abnormal masslike thickening of the pylorus and duodenal bulb, this could be inflammatory but infiltrative process/neoplastic involvement is also a consideration. 5. Fluid-filled loops of small bowel with mucosal enhancement but no obstructive features. Colon wall thickening, mucosal enhancement and indistinct appearance of the ascending, transverse, descending and proximal sigmoid colon. Findings are suspicious for enterocolitis. 6. Moderate volume ascites within the abdomen and pelvis. Generalized subcutaneous edema. 7. Splenomegaly. 8. Aortic atherosclerosis.   [HN]  1656 CT done at [facility] on 10/15: Liver: Similar size of hypoattenuating lesion in the central liver  measuring 2.3 x 1.9 cm (10, 87), previously 2.3 x 2 cm. No new suspicious  hepatic lesions.  There is persistent low attenuation soft tissue versus  fluid surrounding the central portal veins. Redemonstrated is severe  narrowing of the extrahepatic portal vein and SMV as it traverses this area  of soft tissue.   [TY]  1659 Evaluated patient, had a fairly large blood pressure cuff on, but a smaller 1 on she is 105/71 currently.  Husband notes that that is typically what her blood pressure has been the past several weeks.  She continues to have pain seemingly more so in her left flank than abdominal pain.  Her abdomen is soft, nontender.  However given her complex past medical history her severe hypokalemia and transaminitis and persistent pain despite IV narcotics will admit for further pain control and hydration. [TY]    Clinical Course User Index [HN] Franklyn Sid SAILOR, MD [TY] Neysa Caron PARAS, DO   Received signout, disposition pending reevaluation.  See ED course.    Neysa Caron PARAS, DO 03/16/24 0010

## 2024-03-17 ENCOUNTER — Inpatient Hospital Stay (HOSPITAL_COMMUNITY)

## 2024-03-17 DIAGNOSIS — R932 Abnormal findings on diagnostic imaging of liver and biliary tract: Secondary | ICD-10-CM | POA: Diagnosis not present

## 2024-03-17 DIAGNOSIS — K759 Inflammatory liver disease, unspecified: Secondary | ICD-10-CM | POA: Diagnosis not present

## 2024-03-17 DIAGNOSIS — K766 Portal hypertension: Secondary | ICD-10-CM | POA: Diagnosis not present

## 2024-03-17 DIAGNOSIS — R180 Malignant ascites: Secondary | ICD-10-CM | POA: Diagnosis not present

## 2024-03-17 DIAGNOSIS — R161 Splenomegaly, not elsewhere classified: Secondary | ICD-10-CM | POA: Diagnosis not present

## 2024-03-17 LAB — CBC
HCT: 23.8 % — ABNORMAL LOW (ref 36.0–46.0)
Hemoglobin: 7.4 g/dL — ABNORMAL LOW (ref 12.0–15.0)
MCH: 27.2 pg (ref 26.0–34.0)
MCHC: 31.1 g/dL (ref 30.0–36.0)
MCV: 87.5 fL (ref 80.0–100.0)
Platelets: 68 K/uL — ABNORMAL LOW (ref 150–400)
RBC: 2.72 MIL/uL — ABNORMAL LOW (ref 3.87–5.11)
RDW: 18.1 % — ABNORMAL HIGH (ref 11.5–15.5)
WBC: 3.1 K/uL — ABNORMAL LOW (ref 4.0–10.5)
nRBC: 0 % (ref 0.0–0.2)

## 2024-03-17 LAB — COMPREHENSIVE METABOLIC PANEL WITH GFR
ALT: 79 U/L — ABNORMAL HIGH (ref 0–44)
AST: 58 U/L — ABNORMAL HIGH (ref 15–41)
Albumin: 2.5 g/dL — ABNORMAL LOW (ref 3.5–5.0)
Alkaline Phosphatase: 219 U/L — ABNORMAL HIGH (ref 38–126)
Anion gap: 7 (ref 5–15)
BUN: 21 mg/dL (ref 8–23)
CO2: 24 mmol/L (ref 22–32)
Calcium: 7.8 mg/dL — ABNORMAL LOW (ref 8.9–10.3)
Chloride: 101 mmol/L (ref 98–111)
Creatinine, Ser: 1.02 mg/dL — ABNORMAL HIGH (ref 0.44–1.00)
GFR, Estimated: >60 mL/min (ref >60)
Glucose, Bld: 134 mg/dL — ABNORMAL HIGH (ref 70–99)
Potassium: 3.7 mmol/L (ref 3.5–5.1)
Sodium: 132 mmol/L — ABNORMAL LOW (ref 135–145)
Total Bilirubin: 0.8 mg/dL (ref 0.0–1.2)
Total Protein: 4.9 g/dL — ABNORMAL LOW (ref 6.5–8.1)

## 2024-03-17 LAB — MAGNESIUM: Magnesium: 2 mg/dL (ref 1.7–2.4)

## 2024-03-17 LAB — HEMOGLOBIN AND HEMATOCRIT, BLOOD
HCT: 26.7 % — ABNORMAL LOW (ref 36.0–46.0)
Hemoglobin: 8.4 g/dL — ABNORMAL LOW (ref 12.0–15.0)

## 2024-03-17 LAB — PREPARE RBC (CROSSMATCH)

## 2024-03-17 MED ORDER — MORPHINE SULFATE (PF) 2 MG/ML IV SOLN
2.0000 mg | INTRAVENOUS | Status: DC | PRN
  Administered 2024-03-17 – 2024-03-18 (×2): 2 mg via INTRAVENOUS
  Filled 2024-03-17 (×2): qty 1

## 2024-03-17 MED ORDER — SODIUM CHLORIDE 0.9% IV SOLUTION: Freq: Once | INTRAVENOUS | Status: AC

## 2024-03-17 MED ORDER — OXYCODONE HCL 5 MG PO TABS
5.0000 mg | ORAL_TABLET | ORAL | Status: DC | PRN
  Filled 2024-03-17: qty 2

## 2024-03-17 MED ORDER — GADOBUTROL 1 MMOL/ML IV SOLN
6.0000 mL | Freq: Once | INTRAVENOUS | Status: AC | PRN
Start: 2024-03-17 — End: 2024-03-17
  Administered 2024-03-17: 6 mL via INTRAVENOUS

## 2024-03-17 MED ORDER — LIDOCAINE 5 % EX PTCH
1.0000 | MEDICATED_PATCH | CUTANEOUS | Status: DC
  Administered 2024-03-17 – 2024-03-18 (×2): 1 via TRANSDERMAL
  Filled 2024-03-17 (×3): qty 1

## 2024-03-17 MED ORDER — SODIUM CHLORIDE 0.9 % IV SOLN: 2.0000 g | Freq: Two times a day (BID) | INTRAVENOUS | Status: DC

## 2024-03-17 NOTE — Progress Notes (Signed)
 PROGRESS NOTE    Nicole Johnson  FMW:[id number] DOB: 1960/11/29 DOA: 03/15/2024 PCP: Randeen Laine DELENA, MD    Brief Narrative:   Nicole Johnson is a 63 y.o. female with past medical history significant for metastatic pancreatic adenocarcinoma on chemotherapy (follows with [facility]), pancytopenia, cirrhosis with ascites, GERD who presented to [facility] ED on 03/15/2024 with complaints of generalized weakness, back pain.  Patient reports day prior paracentesis with 3 L of fluid removed from her abdomen by interventional radiology; reported tolerated well.  But thereafter she started having worsening right posterior flank pain with generalized weakness and thought that she was becoming anemic as she has had  the symptoms in the past.  Denies any fevers, no anterior abdominal pain, no nausea/vomiting.  In the ED, temperature 99.1 F, HR 113, RR 18, BP 98/70, SpO2 100% on room air.  WBC 3.1, hemoglobin 8.9, platelet count 78.  Sodium 132, potassium 2.6, chloride 99, CO2 24, glucose 108, BUN 15, creatinine 1.04.  Lipase less than 10.  AST 144, ALT 115, total bilirubin 1.0.  Lactic acid 1.2.  COVID/influenza/RSV PCR negative.  Chest x-ray with no acute cardiopulmonary disease process.  CT abdomen/pelvis with poorly defined infiltrative proximal pancreatic mass with vascular involvement as seen previously, decreased size thick walled cystic lesion right hepatic lobe near porta hepatis, interim development multiple cystic appearing lesions posterior right hepatic lobe largest measuring 2.6 cm concerning for abscess versus metastatic disease, indwelling, duct stent with gas in the stent at the porta hepatis but mostly opacified appearance compared to prior study with some pneumobilia, abnormal masslike thickening pylorus and duodenal bulb inflammatory versus infiltrated/neoplastic involvement, colon wall thickening ascending/transverse/descending and proximal sigmoid colon suspicious for enterocolitis, moderate volume ascites,  generalized subcutaneous edema, splenomegaly.  [facility] was consulted for admission.   Assessment & Plan:   Klebsiella aerogenes bacteremia Hepatic abscess versus metastatic lesion Patient presenting with abdominal pain after recent paracentesis day prior by interventional radiology.  Patient with elevated temperature 99.1 F, tachycardic, hypotensive.  WBC count 3.1, although pancytopenic secondary to chemotherapy.  Chest x-ray negative.  CT abdomen/pelvis with findings concerning for new cystic-appearing lesions to the right hepatic lobe largest measuring 2.6 cm concerning for abscess versus metastatic disease. -- Gastroenterology following, appreciate assistance -- MR liver with and without contrast for further evaluation of hepatic lesions -- Blood Cultures: 3/4 + Klebsiella aerogenes; awaiting susceptibilities -- Cefepime 2 g IV every 8 hours -- CBC in a.m.  Hypotension Patient currently on Aldactone  and furosemide  for management of her ascites.  Patient was notably hypotensive on admission.  Received IV albumin. -- Continue to hold home albumin/furosemide  for now -- Monitor BP closely -- Repeat IV albumin as needed  History of malignant pancreatic adenocarcinoma Hepatic abscess versus metastatic lesion Cirrhosis with ascites Transaminitis: Improving Patient follows with medical oncology at [facility] and currently undergoing chemotherapy, has also followed with medical oncology locally with Dr. Lanny.  Also follows with GI, Dr. Rollin.  CT imaging notable for poorly defined proximal pancreatic mass with new findings of additional cystic hepatic lesions that are concerning for abscess versus metastasis. -- AST 144>88>58 -- ALT 115>89>79 -- Tbili 1.0>0.6>0.8 -- CMP in a.m. -- MRI liver as above for further evaluation of hepatic lesions to rule out abscess versus progressive malignancy  Pancytopenia Transfused 1 unit PRBC on 10/24 -- Hgb 8.9>7.0>7.4 -- Transfuse 1 unit PRBC today -- Repeat  H&H following transfusion, CBC in a.m.  GERD -- Omeprazole  20 mg p.o. twice daily  DVT  prophylaxis: SCDs Start: 03/15/24 1742    Code Status: Full Code Family Communication: Updated spouse present at bedside this morning  Disposition Plan:  Level of care: Progressive Status is: Inpatient Remains inpatient appropriate because: IV antibiotics, MR liver; repeat transfusion today    Consultants:  Gastroenterology, Dr. Rollin  Procedures:  None  Antimicrobials:  Cefepime 10/23>>   Subjective: Patient seen examined bedside, lying in bed.  Spouse present at bedside.  Patient reports acute episode of pain overnight towards her back.  Overall feels ill.  Discussed will obtain further imaging of her liver lesions to further delineate whether these are abscesses versus additional malignant sites.  Remains on IV antibiotics, awaiting culture susceptibilities.  Also will transfuse additional unit PRBC today.  No other specific complaints or concerns at this time.  Denies headache, no dizziness, no chest pain, no palpitations, no shortness of breath, no fever/chills/night sweats, no nausea/vomiting/diarrhea, no focal weakness, no fatigue, no paresthesia.  No acute events overnight per nurse staff.  Objective: Vitals:   03/17/24 0451 03/17/24 1300 03/17/24 1349 03/17/24 1420  BP: (!) 92/56 102/77 (!) 83/61 (!) 80/66  Pulse:  80 69 67  Resp: 18 20 16 16   Temp: 99.2 F (37.3 C) 97.8 F (36.6 C) 97.7 F (36.5 C) 97.6 F (36.4 C)  TempSrc: Oral Oral Oral Oral  SpO2: 97% 97% 98% 98%  Weight:      Height:        Intake/Output Summary (Last 24 hours) at 03/17/2024 1508 Last data filed at 03/17/2024 0617 Gross per 24 hour  Intake 783 ml  Output --  Net 783 ml   Filed Weights   03/15/24 2257  Weight: 64.7 kg    Examination:  Physical Exam: GEN: NAD, alert and oriented x 3, chronically ill appearance HEENT: NCAT, PERRL, EOMI, sclera clear, MMM PULM: CTAB w/o wheezes/crackles,  normal respiratory effort, on room air CV: RRR w/o M/G/R GI: abd soft, NTND, + BS MSK: no peripheral edema, moves all extremity independently NEURO: No focal neurological deficit PSYCH: normal mood/affect Integumentary: No concerning rashes/lesions/wounds none exposed and surfaces    Data Reviewed: I have personally reviewed following labs and imaging studies  CBC: Recent Labs  Lab 03/15/24 1338 03/16/24 0557 03/17/24 0110  WBC 3.1* 3.2* 3.1*  NEUTROABS 2.7  --   --   HGB 8.9* 7.0* 7.4*  HCT 29.7* 23.0* 23.8*  MCV 87.6 88.5 87.5  PLT 78* 82* 68*   Basic Metabolic Panel: Recent Labs  Lab 03/15/24 1338 03/16/24 0557 03/17/24 0110  NA 132* 131* 132*  K 2.6* 3.6 3.7  CL 98 99 101  CO2 24 24 24   GLUCOSE 108* 107* 134*  BUN 15 19 21   CREATININE 1.04* 0.87 1.02*  CALCIUM 8.0* 7.7* 7.8*  MG 1.7  --  2.0   GFR: Estimated Creatinine Clearance: 53.5 mL/min (A) (by C-G formula based on SCr of 1.02 mg/dL (H)). Liver Function Tests: Recent Labs  Lab 03/15/24 1338 03/16/24 0557 03/17/24 0110  AST 144* 88* 58*  ALT 115* 99* 79*  ALKPHOS 274* 225* 219*  BILITOT 1.0 0.6 0.8  PROT 5.2* 4.9* 4.9*  ALBUMIN 2.5* 2.3* 2.5*   Recent Labs  Lab 03/15/24 1338  LIPASE <10*   No results for input(s): AMMONIA in the last 168 hours. Coagulation Profile: No results for input(s): INR, PROTIME in the last 168 hours. Cardiac Enzymes: No results for input(s): CKTOTAL, CKMB, CKMBINDEX, TROPONINI in the last 168 hours. BNP (last 3 results) Recent  Labs    04/08/23 0928  PROBNP 93.0   HbA1C: No results for input(s): HGBA1C in the last 72 hours. CBG: No results for input(s): GLUCAP in the last 168 hours. Lipid Profile: No results for input(s): CHOL, HDL, LDLCALC, TRIG, CHOLHDL, LDLDIRECT in the last 72 hours. Thyroid  Function Tests: No results for input(s): TSH, T4TOTAL, FREET4, T3FREE, THYROIDAB in the last 72 hours. Anemia  Panel: Recent Labs    03/16/24 1134  VITAMINB12 2,845*  FOLATE 9.9  FERRITIN 885*  TIBC 161*  IRON 11*  RETICCTPCT 0.9   Sepsis Labs: Recent Labs  Lab 03/15/24 1350  LATICACIDVEN 1.2    Recent Results (from the past 240 hours)  Blood culture (routine x 2)     Status: Abnormal (Preliminary result)   Collection Time: 03/15/24  1:38 PM   Specimen: BLOOD  Result Value Ref Range Status   Specimen Description   Final    BLOOD PORTA CATH Performed at [facility], [address] [place], [place] [address]    Special Requests   Final    BOTTLES DRAWN AEROBIC AND ANAEROBIC Blood Culture adequate volume Performed at [facility], [address], [place], [place] [address]    Culture  Setup Time   Final    GRAM NEGATIVE RODS IN BOTH AEROBIC AND ANAEROBIC BOTTLES L POINTDEXTER PHARM D 03/16/2024 @0237  BY DD    Culture (A)  Final    KLEBSIELLA AEROGENES SUSCEPTIBILITIES TO FOLLOW Performed at [facility], [address] [place], [place] [address]    Report Status PENDING  Incomplete  Resp panel by RT-PCR (RSV, Flu A&B, Covid) Anterior Nasal Swab     Status: None   Collection Time: 03/15/24  1:38 PM   Specimen: Anterior Nasal Swab  Result Value Ref Range Status   SARS Coronavirus 2 by RT PCR NEGATIVE NEGATIVE Final    Comment: (NOTE) SARS-CoV-2 target nucleic acids are NOT DETECTED.  The SARS-CoV-2 RNA is generally detectable in upper respiratory specimens during the acute phase of infection. The lowest concentration of SARS-CoV-2 viral copies this assay can detect is 138 copies/mL. A negative result does not preclude SARS-Cov-2 infection and should not be used as the sole basis for treatment or other patient management decisions. A negative result may occur with  improper specimen collection/handling, submission of specimen other than nasopharyngeal swab, presence of viral mutation(s) within the areas targeted by this assay,  and inadequate number of viral copies(<138 copies/mL). A negative result must be combined with clinical observations, patient history, and epidemiological information. The expected result is Negative.  Fact Sheet for Patients:  bloggercourse.com  Fact Sheet for Healthcare Providers:  seriousbroker.it  This test is no t yet approved or cleared by the [place]  FDA and  has been authorized for detection and/or diagnosis of SARS-CoV-2 by FDA under an Emergency Use Authorization (EUA). This EUA will remain  in effect (meaning this test can be used) for the duration of the COVID-19 declaration under Section 564(b)(1) of the Act, 21 U.S.C.section 360bbb-3(b)(1), unless the authorization is terminated  or revoked sooner.       Influenza A by PCR NEGATIVE NEGATIVE Final   Influenza B by PCR NEGATIVE NEGATIVE Final    Comment: (NOTE) The Xpert Xpress SARS-CoV-2/FLU/RSV plus assay is intended as an aid in the diagnosis of influenza from Nasopharyngeal swab specimens and should not be used as a sole basis for treatment. Nasal washings and aspirates are unacceptable for Xpert Xpress  SARS-CoV-2/FLU/RSV testing.  Fact Sheet for Patients: bloggercourse.com  Fact Sheet for Healthcare Providers: seriousbroker.it  This test is not yet approved or cleared by the [place]  FDA and has been authorized for detection and/or diagnosis of SARS-CoV-2 by FDA under an Emergency Use Authorization (EUA). This EUA will remain in effect (meaning this test can be used) for the duration of the COVID-19 declaration under Section 564(b)(1) of the Act, 21 U.S.C. section 360bbb-3(b)(1), unless the authorization is terminated or revoked.     Resp Syncytial Virus by PCR NEGATIVE NEGATIVE Final    Comment: (NOTE) Fact Sheet for Patients: bloggercourse.com  Fact Sheet for  Healthcare Providers: seriousbroker.it  This test is not yet approved or cleared by the [place]  FDA and has been authorized for detection and/or diagnosis of SARS-CoV-2 by FDA under an Emergency Use Authorization (EUA). This EUA will remain in effect (meaning this test can be used) for the duration of the COVID-19 declaration under Section 564(b)(1) of the Act, 21 U.S.C. section 360bbb-3(b)(1), unless the authorization is terminated or revoked.  Performed at [facility], [address], [place], [place] [address]   Blood Culture ID Panel (Reflexed)     Status: Abnormal   Collection Time: 03/15/24  1:38 PM  Result Value Ref Range Status   Enterococcus faecalis NOT DETECTED NOT DETECTED Final   Enterococcus Faecium NOT DETECTED NOT DETECTED Final   Listeria monocytogenes NOT DETECTED NOT DETECTED Final   Staphylococcus species NOT DETECTED NOT DETECTED Final   Staphylococcus aureus (BCID) NOT DETECTED NOT DETECTED Final   Staphylococcus epidermidis NOT DETECTED NOT DETECTED Final   Staphylococcus lugdunensis NOT DETECTED NOT DETECTED Final   Streptococcus species NOT DETECTED NOT DETECTED Final   Streptococcus agalactiae NOT DETECTED NOT DETECTED Final   Streptococcus pneumoniae NOT DETECTED NOT DETECTED Final   Streptococcus pyogenes NOT DETECTED NOT DETECTED Final   A.calcoaceticus-baumannii NOT DETECTED NOT DETECTED Final   Bacteroides fragilis NOT DETECTED NOT DETECTED Final   Enterobacterales DETECTED (A) NOT DETECTED Final    Comment: Enterobacterales represent a large order of gram negative bacteria, not a single organism. CRITICAL RESULT CALLED TO, READ BACK BY AND VERIFIED WITH: L POINTDEXTER PHARM D 03/16/2024 @0237  BY DD    Enterobacter cloacae complex NOT DETECTED NOT DETECTED Final   Escherichia coli NOT DETECTED NOT DETECTED Final   Klebsiella aerogenes DETECTED (A) NOT DETECTED Final    Comment: CRITICAL RESULT  CALLED TO, READ BACK BY AND VERIFIED WITH: L POINTDEXTER PHARM D 03/16/2024 @0237  BY DD    Klebsiella oxytoca NOT DETECTED NOT DETECTED Final   Klebsiella pneumoniae NOT DETECTED NOT DETECTED Final   Proteus species NOT DETECTED NOT DETECTED Final   Salmonella species NOT DETECTED NOT DETECTED Final   Serratia marcescens NOT DETECTED NOT DETECTED Final   Haemophilus influenzae NOT DETECTED NOT DETECTED Final   Neisseria meningitidis NOT DETECTED NOT DETECTED Final   Pseudomonas aeruginosa NOT DETECTED NOT DETECTED Final   Stenotrophomonas maltophilia NOT DETECTED NOT DETECTED Final   Candida albicans NOT DETECTED NOT DETECTED Final   Candida auris NOT DETECTED NOT DETECTED Final   Candida glabrata NOT DETECTED NOT DETECTED Final   Candida krusei NOT DETECTED NOT DETECTED Final   Candida parapsilosis NOT DETECTED NOT DETECTED Final   Candida tropicalis NOT DETECTED NOT DETECTED Final   Cryptococcus neoformans/gattii NOT DETECTED NOT DETECTED Final   CTX-M ESBL NOT DETECTED NOT DETECTED Final   Carbapenem resistance IMP NOT DETECTED NOT DETECTED Final  Carbapenem resistance KPC NOT DETECTED NOT DETECTED Final   Carbapenem resistance NDM NOT DETECTED NOT DETECTED Final   Carbapenem resist OXA 48 LIKE NOT DETECTED NOT DETECTED Final   Carbapenem resistance VIM NOT DETECTED NOT DETECTED Final    Comment: Performed at [facility], [address], [place], [place] [address]  Blood culture (routine x 2)     Status: Abnormal (Preliminary result)   Collection Time: 03/15/24  2:55 PM   Specimen: BLOOD  Result Value Ref Range Status   Specimen Description   Final    BLOOD BLOOD RIGHT ARM Performed at [facility], [address] [place], [place] [address]    Special Requests   Final    Blood Culture adequate volume BOTTLES DRAWN AEROBIC AND ANAEROBIC Performed at [facility], [address] [place], [place] [address]    Culture  Setup Time    Final    GRAM NEGATIVE RODS ANAEROBIC BOTTLE ONLY CRITICAL VALUE NOTED.  VALUE IS CONSISTENT WITH PREVIOUSLY REPORTED AND CALLED VALUE. Performed at [facility], [address], [place], [place] [address]    Culture KLEBSIELLA AEROGENES (A)  Final   Report Status PENDING  Incomplete         Radiology Studies: US  ABDOMEN LIMITED WITH LIVER DOPPLER Result Date: 03/15/2024 CLINICAL DATA:  Pancreatic head mass EXAM: DUPLEX ULTRASOUND OF LIVER TECHNIQUE: Color and duplex Doppler ultrasound was performed to evaluate the hepatic in-flow and out-flow vessels. COMPARISON:  CT from earlier in the same day. FINDINGS: Liver: The known mass lesion adjacent to the gallbladder fossa is not well appreciated on this exam. Main Portal Vein size: 1.3 cm Portal Vein Velocities Main Prox:  65 cm/sec Right: 25 cm/sec Left: 18 cm/sec Hepatic Vein Velocities Right:  20 cm/sec Middle:  16 cm/sec Left:  28 cm/sec IVC: Present and patent with normal respiratory phasicity. Hepatic Artery Velocity:  51.9 cm/sec Splenic Vein Velocity:  14.7 cm/sec Spleen: 17.7 cm x 0.0 cm x 6.1 cm with a total volume of 395.9 cm^3 (411 cm^3 is upper limit normal) Portal Vein Occlusion/Thrombus: Absent Splenic Vein Occlusion/Thrombus: Absent Ascites: Present Varices: None IMPRESSION: No portal/hepatic venous occlusion is noted. No arterial abnormality is seen. The known pancreatic head mass is not as well appreciated as on recent CT. Known liver metastatic lesion is not well appreciated on this exam. Ascites is seen similar to that noted on prior CT. Spleen is at the upper limits of normal in size. Electronically Signed   By: Oneil Devonshire M.D.   On: 03/15/2024 19:51   CT ABDOMEN PELVIS W CONTRAST Result Date: 03/15/2024 CLINICAL DATA:  Recent paracentesis history of pancreas cancer worsening abdominal pain EXAM: CT ABDOMEN AND PELVIS WITH CONTRAST TECHNIQUE: Multidetector CT imaging of the abdomen and pelvis was performed using the  standard protocol following bolus administration of intravenous contrast. RADIATION DOSE REDUCTION: This exam was performed according to the departmental dose-optimization program which includes automated exposure control, adjustment of the mA and/or kV according to patient size and/or use of iterative reconstruction technique. CONTRAST:  OMNIPAQUE  IOHEXOL  300 MG/ML  SOLN COMPARISON:  CT 10/08/2023, 04/02/2023, 01/11/2023, MRI 12/16/2022 FINDINGS: Lower chest: Lung bases demonstrate no acute airspace disease. Vascular catheter tip at the cavoatrial junction. Hepatobiliary: Trace pneumobilia. Indwelling common duct stent with some gas in the stent at the porta hepatis but mostly opacified appearance of the stent compared to the prior CT from May. Slight interval decrease in size of  a hypodense low-density mass near the gallbladder fossa, today measuring 19 mm on series 2, image 24, previously 4.5 cm. Contracted gallbladder without calcified stone. Interim development of multiple cystic appearing lesions in the posterior right hepatic lobe, largest measuring up to 2.6 cm on series 2, image 19. Focal wedge-shaped hypo density within the posterior right hepatic lobe extending to the subcapsular surface, series 2, image 17. Pancreas: Poorly defined infiltrative proximal pancreatic mass as was seen on prior exams, difficult to measure given infiltrative appearance. Vascular involvement as was seen on prior, involving the proximal SMA, celiac bifurcation and portal splenic confluence. Intra hepatic portal vessels are patent. Spleen: Enlarged, craniocaudal measurement of 19 cm. Adrenals/Urinary Tract: Adrenal glands are normal. Kidneys show no significant hydronephrosis. The bladder is distended Stomach/Bowel: Abnormal masslike thickening of the pylorus and duodenal bulb, series 2, image 25 through 27. Fluid-filled loops of small bowel with mucosal enhancement but no obstructive features. Colon wall thickening,  mucosal enhancement and indistinct appearance of the ascending, transverse, descending and proximal sigmoid colon. Moderate stool in the rectosigmoid colon. Vascular/Lymphatic: Mild atherosclerosis.  No aneurysm. Reproductive: Uterus unremarkable.  No adnexal mass Other: No free air. Moderate volume ascites within the abdomen and pelvis. Generalized subcutaneous edema. Musculoskeletal: Multilevel degenerative change. No acute osseous abnormality. IMPRESSION: 1. Poorly defined infiltrative proximal pancreatic mass with vascular involvement as was seen on prior exams and corresponding to the history of malignancy. 2. Decreased size of previously noted thick walled cystic lesion in the right hepatic lobe near the porta hepatis however interim development of multiple cystic appearing lesions in the posterior right hepatic lobe, largest measuring up to 2.6 cm. Findings could be secondary to intra hepatic abscess versus metastatic disease. Somewhat wedge-shaped area of hypoattenuation adjacent to the multi cystic changes in the right hepatic lobe which may be due to altered perfusion. 3. Indwelling common duct stent with some gas in the stent at the porta hepatis but mostly opacified appearance of the stent compared to the prior CT from May. Some pneumobilia is present. 4. Abnormal masslike thickening of the pylorus and duodenal bulb, this could be inflammatory but infiltrative process/neoplastic involvement is also a consideration. 5. Fluid-filled loops of small bowel with mucosal enhancement but no obstructive features. Colon wall thickening, mucosal enhancement and indistinct appearance of the ascending, transverse, descending and proximal sigmoid colon. Findings are suspicious for enterocolitis. 6. Moderate volume ascites within the abdomen and pelvis. Generalized subcutaneous edema. 7. Splenomegaly. 8. Aortic atherosclerosis. Aortic Atherosclerosis (ICD10-I70.0). Electronically Signed   By: Luke Bun M.D.   On:  03/15/2024 16:24        Scheduled Meds:  Chlorhexidine  Gluconate Cloth  6 each Topical Daily   lidocaine   1 patch Transdermal Q24H   omeprazole   20 mg Oral BID AC   sodium chloride  flush  10-40 mL Intracatheter Q12H   Continuous Infusions:  [START ON 03/18/2024] ceFEPime (MAXIPIME) IV       LOS: 1 day    Time spent: 53 minutes spent on 03/17/2024 caring for this patient face-to-face including chart review, ordering labs/tests, documenting, discussion with nursing staff, consultants, updating family and interview/physical exam    Camellia PARAS Bettymae Yott, DO [facility] Available via Epic secure chat 7am-7pm After these hours, please refer to coverage provider listed on [email or web address] 03/17/2024, 3:08 PM

## 2024-03-18 DIAGNOSIS — K759 Inflammatory liver disease, unspecified: Secondary | ICD-10-CM | POA: Diagnosis not present

## 2024-03-18 LAB — CBC
HCT: 26.2 % — ABNORMAL LOW (ref 36.0–46.0)
Hemoglobin: 8.2 g/dL — ABNORMAL LOW (ref 12.0–15.0)
MCH: 27.6 pg (ref 26.0–34.0)
MCHC: 31.3 g/dL (ref 30.0–36.0)
MCV: 88.2 fL (ref 80.0–100.0)
Platelets: 49 K/uL — ABNORMAL LOW (ref 150–400)
RBC: 2.97 MIL/uL — ABNORMAL LOW (ref 3.87–5.11)
RDW: 18.1 % — ABNORMAL HIGH (ref 11.5–15.5)
WBC: 3.9 K/uL — ABNORMAL LOW (ref 4.0–10.5)
nRBC: 0 % (ref 0.0–0.2)

## 2024-03-18 LAB — CULTURE, BLOOD (ROUTINE X 2)
Special Requests: ADEQUATE
Special Requests: ADEQUATE

## 2024-03-18 LAB — COMPREHENSIVE METABOLIC PANEL WITH GFR
ALT: 58 U/L — ABNORMAL HIGH (ref 0–44)
AST: 35 U/L (ref 15–41)
Albumin: 2.3 g/dL — ABNORMAL LOW (ref 3.5–5.0)
Alkaline Phosphatase: 163 U/L — ABNORMAL HIGH (ref 38–126)
Anion gap: 7 (ref 5–15)
BUN: 26 mg/dL — ABNORMAL HIGH (ref 8–23)
CO2: 24 mmol/L (ref 22–32)
Calcium: 7.8 mg/dL — ABNORMAL LOW (ref 8.9–10.3)
Chloride: 101 mmol/L (ref 98–111)
Creatinine, Ser: 1.23 mg/dL — ABNORMAL HIGH (ref 0.44–1.00)
GFR, Estimated: 49 mL/min — ABNORMAL LOW (ref >60)
Glucose, Bld: 101 mg/dL — ABNORMAL HIGH (ref 70–99)
Potassium: 3.7 mmol/L (ref 3.5–5.1)
Sodium: 132 mmol/L — ABNORMAL LOW (ref 135–145)
Total Bilirubin: 1 mg/dL (ref 0.0–1.2)
Total Protein: 4.5 g/dL — ABNORMAL LOW (ref 6.5–8.1)

## 2024-03-18 MED ORDER — SODIUM CHLORIDE 0.9 % IV SOLN: 2.0000 g | INTRAVENOUS | Status: DC

## 2024-03-18 MED ORDER — POLYETHYLENE GLYCOL 3350 17 G PO PACK
17.0000 g | PACK | Freq: Two times a day (BID) | ORAL | Status: DC | PRN
  Administered 2024-03-18 (×2): 17 g via ORAL
  Filled 2024-03-18 (×2): qty 1

## 2024-03-18 MED ORDER — MIDODRINE HCL 5 MG PO TABS
5.0000 mg | ORAL_TABLET | Freq: Three times a day (TID) | ORAL | Status: DC
  Administered 2024-03-18 (×2): 5 mg via ORAL
  Filled 2024-03-18 (×2): qty 1

## 2024-03-18 MED ORDER — CIPROFLOXACIN IN D5W 400 MG/200ML IV SOLN
400.0000 mg | Freq: Two times a day (BID) | INTRAVENOUS | Status: DC
  Administered 2024-03-18 – 2024-03-19 (×2): 400 mg via INTRAVENOUS
  Filled 2024-03-18 (×2): qty 200

## 2024-03-18 NOTE — Progress Notes (Addendum)
 PROGRESS NOTE    Nicole Johnson  FMW:[id number] DOB: [birth year] DOA: 03/15/2024 PCP: Randeen Laine DELENA, MD    Brief Narrative:   Nicole Johnson is a 63 y.o. female with past medical history significant for metastatic pancreatic adenocarcinoma on chemotherapy (follows with [facility]), pancytopenia, cirrhosis with ascites, GERD who presented to [facility] ED on 03/15/2024 with complaints of generalized weakness, back pain.  Patient reports day prior paracentesis with 3 L of fluid removed from her abdomen by interventional radiology; reported tolerated well.  But thereafter she started having worsening right posterior flank pain with generalized weakness and thought that she was becoming anemic as she has had  the symptoms in the past.  Denies any fevers, no anterior abdominal pain, no nausea/vomiting.  In the ED, temperature 99.1 F, HR 113, RR 18, BP 98/70, SpO2 100% on room air.  WBC 3.1, hemoglobin 8.9, platelet count 78.  Sodium 132, potassium 2.6, chloride 99, CO2 24, glucose 108, BUN 15, creatinine 1.04.  Lipase less than 10.  AST 144, ALT 115, total bilirubin 1.0.  Lactic acid 1.2.  COVID/influenza/RSV PCR negative.  Chest x-ray with no acute cardiopulmonary disease process.  CT abdomen/pelvis with poorly defined infiltrative proximal pancreatic mass with vascular involvement as seen previously, decreased size thick walled cystic lesion right hepatic lobe near porta hepatis, interim development multiple cystic appearing lesions posterior right hepatic lobe largest measuring 2.6 cm concerning for abscess versus metastatic disease, indwelling, duct stent with gas in the stent at the porta hepatis but mostly opacified appearance compared to prior study with some pneumobilia, abnormal masslike thickening pylorus and duodenal bulb inflammatory versus infiltrated/neoplastic involvement, colon wall thickening ascending/transverse/descending and proximal sigmoid colon suspicious for enterocolitis, moderate volume ascites,  generalized subcutaneous edema, splenomegaly.  [facility] was consulted for admission.   Assessment & Plan:   Klebsiella aerogenes bacteremia Hepatic abscess versus metastatic lesion Patient presenting with abdominal pain after recent paracentesis day prior by interventional radiology.  Patient with elevated temperature 99.1 F, tachycardic, hypotensive.  WBC count 3.1, although pancytopenic secondary to chemotherapy.  Chest x-ray negative.  CT abdomen/pelvis with findings concerning for new cystic-appearing lesions to the right hepatic lobe largest measuring 2.6 cm concerning for abscess versus metastatic disease.  MRI liver with and without contrast with cluster of multiple cystic lesions posterior right hepatic lobe favored necrotic versus cystic in the setting of known malignancy, peripherally enhancing lesion in segment 4A suspicious for new liver metastasis, cystic lesion in segment 4B decreased in size compared to prior, poorly defined infiltrative mass pancreatic head with similar soft tissue encasement of the celiac artery, superior mesenteric artery and portal venous confluence, near complete occlusion of the portal venous confluence with distal reconstitution of the SMV/splenic vein, intrahepatic portal vein and portal vein proximal to the confluence remain patent, signs of portal venous hypertension with ascites, splenomegaly increased abdominal varicosities. -- Gastroenterology following, appreciate assistance -- Blood culture sensitivities completed, transition cefepime to Cipro  -- CBC in a.m.  Hypotension Patient currently on Aldactone  and furosemide  for management of her ascites.  Patient was notably hypotensive on admission.  Received IV albumin. -- Continue to hold home albumin/furosemide  for now -- start midodrine 5mg  PO TID -- Monitor BP closely -- Repeat IV albumin as needed  History of malignant pancreatic adenocarcinoma Hepatic abscess versus metastatic lesion Cirrhosis with  ascites Transaminitis: Improving Patient follows with medical oncology at [facility] and currently undergoing chemotherapy, has also followed with medical oncology locally with Dr. Lanny.  Also follows with GI,  Dr. Rollin.  CT imaging notable for poorly defined proximal pancreatic mass with new findings of additional cystic hepatic lesions that are concerning for abscess versus metastasis.  MR liver with and without contrast with new finding of enhancing lesion in segment 4A suspicious for new liver metastasis. -- AST 144>88>58>35 -- ALT 115>89>79>58 -- Tbili 1.0>0.6>0.8 -- CMP in a.m. -- Outpatient follow-up with oncology  Pancytopenia Transfused 1 unit PRBC on 10/24, and 1 u pRBC on 10/25 -- Hgb 8.9>7.0>7.4>8.3 -- Transfuse 1 unit PRBC today -- Repeat H&H following transfusion, CBC in a.m.  GERD -- Omeprazole  20 mg p.o. twice daily  DVT prophylaxis: SCDs Start: 03/15/24 1742    Code Status: Full Code Family Communication: Updated spouse present at bedside this morning  Disposition Plan:  Level of care: Progressive Status is: Inpatient Remains inpatient appropriate because: IV antibiotics    Consultants:  Gastroenterology, Dr. Rollin  Procedures:  None  Antimicrobials:  Cefepime 10/23 - 10/26 Ceftriaxone 10/26>>   Subjective: Patient seen examined bedside, lying in bed.  Spouse present at bedside.  Updated patient regarding liver MRI with findings likely of necrotic metastasis as well as concern for new metastatic lesion, no abscesses appreciated.  Blood culture susceptibilities have returned, will transition cefepime to ceftriaxone given thrombocytopenia.  Hemoglobin stable 8.3 today.  Reports pain improved.  Concerned about her blood pressure will start midodrine today..  Denies headache, no dizziness, no chest pain, no palpitations, no shortness of breath, no fever/chills/night sweats, no nausea/vomiting/diarrhea, no focal weakness, no fatigue, no paresthesia.  No acute events  overnight per nursing staff.  Objective: Vitals:   03/17/24 1657 03/17/24 2008 03/18/24 0423 03/18/24 1247  BP: (!) 86/60 (!) 82/58 (!) 86/59 98/72  Pulse: 64 68 65 68  Resp: 16 12 16 16   Temp: (!) 97.4 F (36.3 C) 97.6 F (36.4 C) (!) 97.5 F (36.4 C) 97.6 F (36.4 C)  TempSrc: Oral Oral Oral Oral  SpO2: 97% 98% 98% 100%  Weight:      Height:        Intake/Output Summary (Last 24 hours) at 03/18/2024 1310 Last data filed at 03/18/2024 1045 Gross per 24 hour  Intake 653.5 ml  Output --  Net 653.5 ml   Filed Weights   03/15/24 2257  Weight: 64.7 kg    Examination:  Physical Exam: GEN: NAD, alert and oriented x 3, chronically ill appearance HEENT: NCAT, PERRL, EOMI, sclera clear, MMM PULM: CTAB w/o wheezes/crackles, normal respiratory effort, on room air CV: RRR w/o M/G/R GI: abd soft, NTND, + BS MSK: no peripheral edema, moves all extremity independently NEURO: No focal neurological deficit PSYCH: normal mood/affect Integumentary: No concerning rashes/lesions/wounds noted on exposed and surfaces    Data Reviewed: I have personally reviewed following labs and imaging studies  CBC: Recent Labs  Lab 03/15/24 1338 03/16/24 0557 03/17/24 0110 03/17/24 1909 03/18/24 0347  WBC 3.1* 3.2* 3.1*  --  3.9*  NEUTROABS 2.7  --   --   --   --   HGB 8.9* 7.0* 7.4* 8.4* 8.2*  HCT 29.7* 23.0* 23.8* 26.7* 26.2*  MCV 87.6 88.5 87.5  --  88.2  PLT 78* 82* 68*  --  49*   Basic Metabolic Panel: Recent Labs  Lab 03/15/24 1338 03/16/24 0557 03/17/24 0110 03/18/24 0347  NA 132* 131* 132* 132*  K 2.6* 3.6 3.7 3.7  CL 98 99 101 101  CO2 24 24 24 24   GLUCOSE 108* 107* 134* 101*  BUN 15 19  21 26*  CREATININE 1.04* 0.87 1.02* 1.23*  CALCIUM 8.0* 7.7* 7.8* 7.8*  MG 1.7  --  2.0  --    GFR: Estimated Creatinine Clearance: 44.4 mL/min (A) (by C-G formula based on SCr of 1.23 mg/dL (H)). Liver Function Tests: Recent Labs  Lab 03/15/24 1338 03/16/24 0557  03/17/24 0110 03/18/24 0347  AST 144* 88* 58* 35  ALT 115* 99* 79* 58*  ALKPHOS 274* 225* 219* 163*  BILITOT 1.0 0.6 0.8 1.0  PROT 5.2* 4.9* 4.9* 4.5*  ALBUMIN 2.5* 2.3* 2.5* 2.3*   Recent Labs  Lab 03/15/24 1338  LIPASE <10*   No results for input(s): AMMONIA in the last 168 hours. Coagulation Profile: No results for input(s): INR, PROTIME in the last 168 hours. Cardiac Enzymes: No results for input(s): CKTOTAL, CKMB, CKMBINDEX, TROPONINI in the last 168 hours. BNP (last 3 results) Recent Labs    04/08/23 0928  PROBNP 93.0   HbA1C: No results for input(s): HGBA1C in the last 72 hours. CBG: No results for input(s): GLUCAP in the last 168 hours. Lipid Profile: No results for input(s): CHOL, HDL, LDLCALC, TRIG, CHOLHDL, LDLDIRECT in the last 72 hours. Thyroid  Function Tests: No results for input(s): TSH, T4TOTAL, FREET4, T3FREE, THYROIDAB in the last 72 hours. Anemia Panel: Recent Labs    03/16/24 1134  VITAMINB12 2,845*  FOLATE 9.9  FERRITIN 885*  TIBC 161*  IRON 11*  RETICCTPCT 0.9   Sepsis Labs: Recent Labs  Lab 03/15/24 1350  LATICACIDVEN 1.2    Recent Results (from the past 240 hours)  Blood culture (routine x 2)     Status: Abnormal   Collection Time: 03/15/24  1:38 PM   Specimen: BLOOD  Result Value Ref Range Status   Specimen Description   Final    BLOOD PORTA CATH Performed at [facility], [address] [place], [place] [address]    Special Requests   Final    BOTTLES DRAWN AEROBIC AND ANAEROBIC Blood Culture adequate volume Performed at [facility], [address] [place], [place] [address]    Culture  Setup Time   Final    GRAM NEGATIVE RODS IN BOTH AEROBIC AND ANAEROBIC BOTTLES L POINTDEXTER PHARM D 03/16/2024 @0237  BY DD Performed at [facility], [address], [place], [place] [address]    Culture KLEBSIELLA AEROGENES (A)  Final   Report Status  03/18/2024 FINAL  Final   Organism ID, Bacteria KLEBSIELLA AEROGENES  Final      Susceptibility   Klebsiella aerogenes - MIC*    CEFEPIME <=0.12 SENSITIVE Sensitive     ERTAPENEM <=0.12 SENSITIVE Sensitive     CEFTRIAXONE <=0.25 SENSITIVE Sensitive     CIPROFLOXACIN  <=0.06 SENSITIVE Sensitive     GENTAMICIN <=1 SENSITIVE Sensitive     MEROPENEM <=0.25 SENSITIVE Sensitive     TRIMETH/SULFA <=20 SENSITIVE Sensitive     PIP/TAZO Value in next row Sensitive      <=4 SENSITIVEThis is a modified FDA-approved test that has been validated and its performance characteristics determined by the reporting laboratory.  This laboratory is certified under the Clinical Laboratory Improvement Amendments CLIA as qualified to perform high complexity clinical laboratory testing.    * KLEBSIELLA AEROGENES  Resp panel by RT-PCR (RSV, Flu A&B, Covid) Anterior Nasal Swab     Status: None   Collection Time: 03/15/24  1:38 PM   Specimen: Anterior Nasal Swab  Result Value Ref Range Status   SARS Coronavirus 2 by  RT PCR NEGATIVE NEGATIVE Final    Comment: (NOTE) SARS-CoV-2 target nucleic acids are NOT DETECTED.  The SARS-CoV-2 RNA is generally detectable in upper respiratory specimens during the acute phase of infection. The lowest concentration of SARS-CoV-2 viral copies this assay can detect is 138 copies/mL. A negative result does not preclude SARS-Cov-2 infection and should not be used as the sole basis for treatment or other patient management decisions. A negative result may occur with  improper specimen collection/handling, submission of specimen other than nasopharyngeal swab, presence of viral mutation(s) within the areas targeted by this assay, and inadequate number of viral copies(<138 copies/mL). A negative result must be combined with clinical observations, patient history, and epidemiological information. The expected result is Negative.  Fact Sheet for Patients:   bloggercourse.com  Fact Sheet for Healthcare Providers:  seriousbroker.it  This test is no t yet approved or cleared by the [place]  FDA and  has been authorized for detection and/or diagnosis of SARS-CoV-2 by FDA under an Emergency Use Authorization (EUA). This EUA will remain  in effect (meaning this test can be used) for the duration of the COVID-19 declaration under Section 564(b)(1) of the Act, 21 U.S.C.section 360bbb-3(b)(1), unless the authorization is terminated  or revoked sooner.       Influenza A by PCR NEGATIVE NEGATIVE Final   Influenza B by PCR NEGATIVE NEGATIVE Final    Comment: (NOTE) The Xpert Xpress SARS-CoV-2/FLU/RSV plus assay is intended as an aid in the diagnosis of influenza from Nasopharyngeal swab specimens and should not be used as a sole basis for treatment. Nasal washings and aspirates are unacceptable for Xpert Xpress SARS-CoV-2/FLU/RSV testing.  Fact Sheet for Patients: bloggercourse.com  Fact Sheet for Healthcare Providers: seriousbroker.it  This test is not yet approved or cleared by the [place]  FDA and has been authorized for detection and/or diagnosis of SARS-CoV-2 by FDA under an Emergency Use Authorization (EUA). This EUA will remain in effect (meaning this test can be used) for the duration of the COVID-19 declaration under Section 564(b)(1) of the Act, 21 U.S.C. section 360bbb-3(b)(1), unless the authorization is terminated or revoked.     Resp Syncytial Virus by PCR NEGATIVE NEGATIVE Final    Comment: (NOTE) Fact Sheet for Patients: bloggercourse.com  Fact Sheet for Healthcare Providers: seriousbroker.it  This test is not yet approved or cleared by the [place]  FDA and has been authorized for detection and/or diagnosis of SARS-CoV-2 by FDA under an Emergency Use  Authorization (EUA). This EUA will remain in effect (meaning this test can be used) for the duration of the COVID-19 declaration under Section 564(b)(1) of the Act, 21 U.S.C. section 360bbb-3(b)(1), unless the authorization is terminated or revoked.  Performed at [facility], [address] [place], [place] [address]   Blood Culture ID Panel (Reflexed)     Status: Abnormal   Collection Time: 03/15/24  1:38 PM  Result Value Ref Range Status   Enterococcus faecalis NOT DETECTED NOT DETECTED Final   Enterococcus Faecium NOT DETECTED NOT DETECTED Final   Listeria monocytogenes NOT DETECTED NOT DETECTED Final   Staphylococcus species NOT DETECTED NOT DETECTED Final   Staphylococcus aureus (BCID) NOT DETECTED NOT DETECTED Final   Staphylococcus epidermidis NOT DETECTED NOT DETECTED Final   Staphylococcus lugdunensis NOT DETECTED NOT DETECTED Final   Streptococcus species NOT DETECTED NOT DETECTED Final   Streptococcus agalactiae NOT DETECTED NOT DETECTED Final   Streptococcus pneumoniae NOT DETECTED NOT DETECTED Final   Streptococcus pyogenes NOT DETECTED  NOT DETECTED Final   A.calcoaceticus-baumannii NOT DETECTED NOT DETECTED Final   Bacteroides fragilis NOT DETECTED NOT DETECTED Final   Enterobacterales DETECTED (A) NOT DETECTED Final    Comment: Enterobacterales represent a large order of gram negative bacteria, not a single organism. CRITICAL RESULT CALLED TO, READ BACK BY AND VERIFIED WITH: L POINTDEXTER PHARM D 03/16/2024 @0237  BY DD    Enterobacter cloacae complex NOT DETECTED NOT DETECTED Final   Escherichia coli NOT DETECTED NOT DETECTED Final   Klebsiella aerogenes DETECTED (A) NOT DETECTED Final    Comment: CRITICAL RESULT CALLED TO, READ BACK BY AND VERIFIED WITH: L POINTDEXTER PHARM D 03/16/2024 @0237  BY DD    Klebsiella oxytoca NOT DETECTED NOT DETECTED Final   Klebsiella pneumoniae NOT DETECTED NOT DETECTED Final   Proteus species NOT DETECTED NOT  DETECTED Final   Salmonella species NOT DETECTED NOT DETECTED Final   Serratia marcescens NOT DETECTED NOT DETECTED Final   Haemophilus influenzae NOT DETECTED NOT DETECTED Final   Neisseria meningitidis NOT DETECTED NOT DETECTED Final   Pseudomonas aeruginosa NOT DETECTED NOT DETECTED Final   Stenotrophomonas maltophilia NOT DETECTED NOT DETECTED Final   Candida albicans NOT DETECTED NOT DETECTED Final   Candida auris NOT DETECTED NOT DETECTED Final   Candida glabrata NOT DETECTED NOT DETECTED Final   Candida krusei NOT DETECTED NOT DETECTED Final   Candida parapsilosis NOT DETECTED NOT DETECTED Final   Candida tropicalis NOT DETECTED NOT DETECTED Final   Cryptococcus neoformans/gattii NOT DETECTED NOT DETECTED Final   CTX-M ESBL NOT DETECTED NOT DETECTED Final   Carbapenem resistance IMP NOT DETECTED NOT DETECTED Final   Carbapenem resistance KPC NOT DETECTED NOT DETECTED Final   Carbapenem resistance NDM NOT DETECTED NOT DETECTED Final   Carbapenem resist OXA 48 LIKE NOT DETECTED NOT DETECTED Final   Carbapenem resistance VIM NOT DETECTED NOT DETECTED Final    Comment: Performed at [facility], [address], [place], [place] [address]  Blood culture (routine x 2)     Status: Abnormal   Collection Time: 03/15/24  2:55 PM   Specimen: BLOOD  Result Value Ref Range Status   Specimen Description   Final    BLOOD BLOOD RIGHT ARM Performed at [facility], [address] [place], [place] [address]    Special Requests   Final    Blood Culture adequate volume BOTTLES DRAWN AEROBIC AND ANAEROBIC Performed at [facility], [address] [place], [place] [address]    Culture  Setup Time   Final    GRAM NEGATIVE RODS ANAEROBIC BOTTLE ONLY CRITICAL VALUE NOTED.  VALUE IS CONSISTENT WITH PREVIOUSLY REPORTED AND CALLED VALUE.    Culture (A)  Final    KLEBSIELLA AEROGENES SUSCEPTIBILITIES PERFORMED ON PREVIOUS CULTURE WITHIN THE LAST 5  DAYS. Performed at [facility], [address], [place], [place] [address]    Report Status 03/18/2024 FINAL  Final         Radiology Studies: MR LIVER W WO CONTRAST Result Date: 03/18/2024 EXAM: MRI ABDOMEN 03/17/2024 05:49:47 PM TECHNIQUE: Multiplanar multisequence MRI of the abdomen was performed with and without the administration of intravenous contrast. COMPARISON: 03/15/2024 and 10/08/2023. CLINICAL HISTORY: Liver lesion, > 1cm. History of metastatic pancreas adenocarcinoma with malignant ascites and new right lobe of liver lesions. FINDINGS: LIVER: Within the posterior right lobe of the liver there is a cluster of multiple cystic lesions which appear new compared to 10/08/2023. The largest of these measures  2.8 x 2.3 cm. These are all T2 hyperintense with peripheral rim enhancement. On the adc map these all exhibit central increased signal with peripheral low signal. On the diffusion-weighted sequences there is peripheral restricted diffusion . In the setting of a known malignancy, these are favored to represent necrotic or cystic metastases. Within segment 4a there is a peripherally enhancing lesion measuring 1.5 x 0.8 cm with this shows restricted diffusion and is concerning for metastatic disease. Within segment 4b, there is a cystic lesion which measures 2.4 x 1.6 cm (image 44/13). This shows mild peripheral rim enhancement with central restricted diffusion. On the previous exam, this measured 3.3 x 2.8 cm. Also concerning for metastatic disease. SABRA GALLBLADDER AND BILIARY SYSTEM: The gallbladder appears surgically absent. Status post cholecystectomy. Common bile duct stent is in place. No significant intrahepatic bile duct dilatation. SPLEEN: Unchanged splenomegaly. PANCREAS: The poorly defined infiltrative mass involving the head of the pancreas is difficult to measure given the infiltrative appearance. Similar appearance of soft tissue encasement of the celiac artery, superior  mesenteric artery and the portal vein / portal venous confluence. ADRENAL GLANDS: Normal size and morphology bilaterally. No nodule, thickening, or hemorrhage. No periadrenal stranding. KIDNEYS: Unremarkable. LYMPH NODES: No lymphadenopathy. VASCULATURE: Near complete occlusion of the portal venous confluence with distal reconstitution of the SMV and splenic vein. Intrahepatic portal vein remains patent as well as the portal vein proximal to the confluence. Increased upper abdominal varicosities are noted, likely reflecting portal venous hypertension. PERITONEUM: Moderate to large volume of ascites similar to 03/15/2024. BOWEL: Moderate stool burden noted throughout the colon. No dilated large or small bowel loops. Similar appearance of thickening of the antropyloric junction. ABDOMINAL WALL: No acute abnormality. BONES: No acute abnormality. LUNGS: Trace right pleural effusion. IMPRESSION: 1. Cluster of multiple cystic lesions in the posterior right hepatic lobe, favored necrotic or cystic metastases in the setting of known malignancy. 2. Peripherally enhancing lesion in segment 4a, suspicious for new liver metastasis. 3. Cystic lesion in segment 4b, decreased in size compared to prior exam. 4. Poorly defined infiltrative mass involving the pancreatic head with similar soft tissue encasement of the celiac artery, superior mesenteric artery, and portal venous confluence. 5. Near-complete occlusion of the portal venous confluence with distal reconstitution of the SMV and splenic vein; intrahepatic portal vein and portal vein proximal to the confluence remain patent. 6. Signs of portal venous hypertension with ascites, splenomegaly and increased upper abdominal varicosities. Electronically signed by: Waddell Calk MD 03/18/2024 05:22 AM EDT RP Workstation: [id number]        Scheduled Meds:  Chlorhexidine  Gluconate Cloth  6 each Topical Daily   lidocaine   1 patch Transdermal Q24H   midodrine  5 mg Oral TID WC    omeprazole   20 mg Oral BID AC   sodium chloride  flush  10-40 mL Intracatheter Q12H   Continuous Infusions:  ceFEPime (MAXIPIME) IV       LOS: 2 days    Time spent: 53 minutes spent on 03/18/2024 caring for this patient face-to-face including chart review, ordering labs/tests, documenting, discussion with nursing staff, consultants, updating family and interview/physical exam    Nicole Johnson PARAS Mathieu Schloemer, DO [facility] Available via Epic secure chat 7am-7pm After these hours, please refer to coverage provider listed on [email or web address] 03/18/2024, 1:10 PM

## 2024-03-19 ENCOUNTER — Other Ambulatory Visit (HOSPITAL_COMMUNITY): Payer: Self-pay

## 2024-03-19 DIAGNOSIS — K759 Inflammatory liver disease, unspecified: Secondary | ICD-10-CM | POA: Diagnosis not present

## 2024-03-19 LAB — CBC
HCT: 31.1 % — ABNORMAL LOW (ref 36.0–46.0)
Hemoglobin: 9.6 g/dL — ABNORMAL LOW (ref 12.0–15.0)
MCH: 27.4 pg (ref 26.0–34.0)
MCHC: 30.9 g/dL (ref 30.0–36.0)
MCV: 88.6 fL (ref 80.0–100.0)
Platelets: 47 K/uL — ABNORMAL LOW (ref 150–400)
RBC: 3.51 MIL/uL — ABNORMAL LOW (ref 3.87–5.11)
RDW: 18.7 % — ABNORMAL HIGH (ref 11.5–15.5)
WBC: 2.7 K/uL — ABNORMAL LOW (ref 4.0–10.5)
nRBC: 0 % (ref 0.0–0.2)

## 2024-03-19 LAB — TYPE AND SCREEN
ABO/RH(D): O POS
Antibody Screen: NEGATIVE
Unit division: 0
Unit division: 0

## 2024-03-19 LAB — BPAM RBC
Blood Product Expiration Date: 202511212359
Blood Product Expiration Date: 202511242359
ISSUE DATE / TIME: 202510241335
ISSUE DATE / TIME: 202510251400
Unit Type and Rh: 5100
Unit Type and Rh: 5100

## 2024-03-19 LAB — COMPREHENSIVE METABOLIC PANEL WITH GFR
ALT: 62 U/L — ABNORMAL HIGH (ref 0–44)
AST: 42 U/L — ABNORMAL HIGH (ref 15–41)
Albumin: 2.4 g/dL — ABNORMAL LOW (ref 3.5–5.0)
Alkaline Phosphatase: 229 U/L — ABNORMAL HIGH (ref 38–126)
Anion gap: 7 (ref 5–15)
BUN: 24 mg/dL — ABNORMAL HIGH (ref 8–23)
CO2: 24 mmol/L (ref 22–32)
Calcium: 7.9 mg/dL — ABNORMAL LOW (ref 8.9–10.3)
Chloride: 101 mmol/L (ref 98–111)
Creatinine, Ser: 1.01 mg/dL — ABNORMAL HIGH (ref 0.44–1.00)
GFR, Estimated: >60 mL/min (ref >60)
Glucose, Bld: 123 mg/dL — ABNORMAL HIGH (ref 70–99)
Potassium: 4 mmol/L (ref 3.5–5.1)
Sodium: 132 mmol/L — ABNORMAL LOW (ref 135–145)
Total Bilirubin: 0.9 mg/dL (ref 0.0–1.2)
Total Protein: 4.8 g/dL — ABNORMAL LOW (ref 6.5–8.1)

## 2024-03-19 MED ORDER — HEPARIN SOD (PORK) LOCK FLUSH 100 UNIT/ML IV SOLN
500.0000 [IU] | INTRAVENOUS | Status: AC | PRN
  Administered 2024-03-19: 500 [IU]

## 2024-03-19 MED ORDER — CIPROFLOXACIN HCL 500 MG PO TABS
500.0000 mg | ORAL_TABLET | Freq: Two times a day (BID) | ORAL | 0 refills | Status: AC
  Filled 2024-03-19: qty 28, 14d supply, fill #0

## 2024-03-19 MED ORDER — MIDODRINE HCL 5 MG PO TABS
10.0000 mg | ORAL_TABLET | Freq: Three times a day (TID) | ORAL | Status: DC
  Administered 2024-03-19 (×2): 10 mg via ORAL
  Filled 2024-03-19 (×2): qty 2

## 2024-03-19 MED ORDER — MIDODRINE HCL 10 MG PO TABS
10.0000 mg | ORAL_TABLET | Freq: Three times a day (TID) | ORAL | 0 refills | Status: DC
  Filled 2024-03-19: qty 90, 30d supply, fill #0

## 2024-03-19 NOTE — Progress Notes (Signed)
 Discharge medications delivered to patient at the bedside in a secure bag.

## 2024-03-19 NOTE — Discharge Summary (Signed)
 Physician Discharge Summary  Nicole Johnson FMW:[id number] DOB: 10-26-1960 DOA: 03/15/2024  PCP: Randeen Laine DELENA, MD  Admit date: 03/15/2024 Discharge date: 03/19/2024  Admitted From: Home Disposition: Home  Recommendations for Outpatient Follow-up:  Follow up with PCP in 1-2 weeks Follow-up with gastroenterology, Dr. Rollin 2-3 weeks Continue ciprofloxacin  to complete 2-week course for Klebsiella bacteremia Started on midodrine for hypotension related to infection versus cirrhosis Recommend holding Lasix  and spironolactone  x 1 week and may restart if blood pressure improves Follow-up with medical oncology as scheduled Please obtain CMP/CBC, repeat blood cultures in 2 weeks after completes antibiotic course Recommend goals of care discussion outpatient with patient's established physicians/care team given metastatic pancreatic cancer with noted progression and with new liver lesion noted on MRI while inpatient.  Home Health: No Equipment/Devices: None  Discharge Condition: Stable CODE STATUS: Full code Diet recommendation: Heart healthy diet  History of present illness:  Nicole Johnson is a 63 y.o. female with past medical history significant for metastatic pancreatic adenocarcinoma on chemotherapy (follows with [facility]), pancytopenia, cirrhosis with ascites, GERD who presented to [facility] ED on 03/15/2024 with complaints of generalized weakness, back pain.  Patient reports day prior paracentesis with 3 L of fluid removed from her abdomen by interventional radiology; reported tolerated well.  But thereafter she started having worsening right posterior flank pain with generalized weakness and thought that she was becoming anemic as she has had  the symptoms in the past.  Denies any fevers, no anterior abdominal pain, no nausea/vomiting.   In the ED, temperature 99.1 F, HR 113, RR 18, BP 98/70, SpO2 100% on room air.  WBC 3.1, hemoglobin 8.9, platelet count 78.  Sodium 132, potassium 2.6, chloride  99, CO2 24, glucose 108, BUN 15, creatinine 1.04.  Lipase less than 10.  AST 144, ALT 115, total bilirubin 1.0.  Lactic acid 1.2.  COVID/influenza/RSV PCR negative.  Chest x-ray with no acute cardiopulmonary disease process.  CT abdomen/pelvis with poorly defined infiltrative proximal pancreatic mass with vascular involvement as seen previously, decreased size thick walled cystic lesion right hepatic lobe near porta hepatis, interim development multiple cystic appearing lesions posterior right hepatic lobe largest measuring 2.6 cm concerning for abscess versus metastatic disease, indwelling, duct stent with gas in the stent at the porta hepatis but mostly opacified appearance compared to prior study with some pneumobilia, abnormal masslike thickening pylorus and duodenal bulb inflammatory versus infiltrated/neoplastic involvement, colon wall thickening ascending/transverse/descending and proximal sigmoid colon suspicious for enterocolitis, moderate volume ascites, generalized subcutaneous edema, splenomegaly.  [facility] was consulted for admission.  Hospital course:  Klebsiella aerogenes bacteremia Patient presenting with abdominal pain after recent paracentesis day prior by interventional radiology.  Patient with elevated temperature 99.1 F, tachycardic, hypotensive.  WBC count 3.1, although pancytopenic secondary to chemotherapy.  Chest x-ray negative.  CT abdomen/pelvis with findings concerning for new cystic-appearing lesions to the right hepatic lobe largest measuring 2.6 cm concerning for abscess versus metastatic disease.  MRI liver with and without contrast with cluster of multiple cystic lesions posterior right hepatic lobe favored necrotic versus cystic in the setting of known malignancy, peripherally enhancing lesion in segment 4A suspicious for new liver metastasis, cystic lesion in segment 4B decreased in size compared to prior, poorly defined infiltrative mass pancreatic head with similar soft tissue  encasement of the celiac artery, superior mesenteric artery and portal venous confluence, near complete occlusion of the portal venous confluence with distal reconstitution of the SMV/splenic vein, intrahepatic portal vein and portal vein proximal  to the confluence remain patent, signs of portal venous hypertension with ascites, splenomegaly increased abdominal varicosities.  Patient was initially started on cefepime and transition to Cipro .  Per ID pharmacist recommend 2-week antibiotic course with recommended repeat blood cultures thereafter.  Outpatient follow-up with PCP, GI, oncology.   Hypotension Patient currently on Aldactone  and furosemide  for management of her ascites.  Patient was notably hypotensive on admission.  Received IV albumin.  Holding home spironolactone  and furosemide .  Started on midodrine.  Recommend hold spironolactone  and furosemide  x 1 week and may resume if blood pressure improves.  Outpatient follow-up with PCP, oncology, GI.   History of malignant pancreatic adenocarcinoma Concern for progressive metastasis Cirrhosis with ascites Transaminitis Patient follows with medical oncology at [facility] and currently undergoing chemotherapy, has also followed with medical oncology locally with Dr. Lanny.  Also follows with GI, Dr. Rollin.  CT imaging notable for poorly defined proximal pancreatic mass with new findings of additional cystic hepatic lesions that are concerning for abscess versus metastasis.  MR liver with and without contrast with new finding of enhancing lesion in segment 4A suspicious for new liver metastasis.  Recommend close outpatient follow-up with oncology.  Likely needs further goals of care discussion due to progressive metastasis with new liver lesion noted on MRI.    Pancytopenia Transfused 2 unit PRBCs during hospitalization, hemoglobin 9.3 at time of discharge.  Recommend repeat CBC 2 weeks   GERD Continue PPI  Discharge Diagnoses:  Principal Problem:    Hepatitis    Discharge Instructions  Discharge Instructions     Call MD for:  difficulty breathing, headache or visual disturbances   Complete by: As directed    Call MD for:  extreme fatigue   Complete by: As directed    Call MD for:  persistant dizziness or light-headedness   Complete by: As directed    Call MD for:  persistant nausea and vomiting   Complete by: As directed    Call MD for:  severe uncontrolled pain   Complete by: As directed    Call MD for:  temperature >100.4   Complete by: As directed    Diet - low sodium heart healthy   Complete by: As directed    Increase activity slowly   Complete by: As directed       Allergies as of 03/19/2024       Reactions   Other Nausea Only, Other (See Comments)   Certain types of Anesthesia cause SEVERE NAUSEA   Codeine Nausea Only   Hydrocodone -acetaminophen  Nausea Only, Other (See Comments)   Reaction unconfirmed,ed/might be nausea   Meloxicam Nausea Only   Tramadol Nausea Only        Medication List     PAUSE taking these medications    furosemide  40 MG tablet Wait to take this until: March 26, 2024 Commonly known as: LASIX  Take 40 mg by mouth in the morning.   spironolactone  100 MG tablet Wait to take this until: March 26, 2024 Commonly known as: ALDACTONE  Take 100 mg by mouth daily.       STOP taking these medications    omeprazole  20 MG capsule Commonly known as: PRILOSEC       TAKE these medications    Advil 200 MG Caps Generic drug: Ibuprofen Take 400 mg by mouth every 6 (six) hours as needed (for pain- rotate with 1,300 mg of Tylenol ).   ciprofloxacin  500 MG tablet Commonly known as: Cipro  Take 1 tablet (500 mg total) by  mouth 2 (two) times daily for 14 days.   Fulphila 6 MG/0.6ML injection Generic drug: pegfilgrastim-jmdb Inject 12 mg into the skin See admin instructions. Inject 12 mg into the skin every other Wednesday   MAGNESIUM PO Take 2 tablets by mouth daily.    midodrine 10 MG tablet Commonly known as: PROAMATINE Take 1 tablet (10 mg total) by mouth 3 (three) times daily with meals.   ondansetron  8 MG tablet Commonly known as: ZOFRAN  Take 8 mg by mouth 2 (two) times daily as needed for nausea or vomiting.   PriLOSEC OTC 20 MG tablet Generic drug: omeprazole  Take 20 mg by mouth 2 (two) times daily before a meal.   prochlorperazine  10 MG tablet Commonly known as: COMPAZINE  Take 10 mg by mouth every 6 (six) hours as needed for nausea or vomiting.   Tylenol  8 Hour Arthritis Pain 650 MG CR tablet Generic drug: acetaminophen  Take 1,300 mg by mouth every 8 (eight) hours as needed for pain (rotate with 400 mg of Advil).   VITAMIN B-12 PO Place 1 tablet under the tongue daily.        Follow-up Information     Tower, Laine LABOR, MD. Schedule an appointment as soon as possible for a visit in 1 week(s).   Specialties: Family Medicine, Radiology Contact information: [address] [place] [address] [phone]         Rollin Dover, MD. Schedule an appointment as soon as possible for a visit.   Specialty: Gastroenterology Contact information: [address] [place] [address] [phone]         Lanny Callander, MD. Schedule an appointment as soon as possible for a visit.   Specialties: Hematology, Oncology Why: Will need repeat CBC and repeat blood cultures in 2 weeks after antibiotics completed. Contact information: [address] [place] [address] [phone]                Allergies  Allergen Reactions   Other Nausea Only and Other (See Comments)    Certain types of Anesthesia cause SEVERE NAUSEA   Codeine Nausea Only   Hydrocodone -Acetaminophen  Nausea Only and Other (See Comments)    Reaction unconfirmed,ed/might be nausea   Meloxicam Nausea Only   Tramadol Nausea Only    Consultations: GI, Dr. Rollin   Procedures/Studies: MR LIVER W WO CONTRAST Result Date:  03/18/2024 EXAM: MRI ABDOMEN 03/17/2024 05:49:47 PM TECHNIQUE: Multiplanar multisequence MRI of the abdomen was performed with and without the administration of intravenous contrast. COMPARISON: 03/15/2024 and 10/08/2023. CLINICAL HISTORY: Liver lesion, > 1cm. History of metastatic pancreas adenocarcinoma with malignant ascites and new right lobe of liver lesions. FINDINGS: LIVER: Within the posterior right lobe of the liver there is a cluster of multiple cystic lesions which appear new compared to 10/08/2023. The largest of these measures 2.8 x 2.3 cm. These are all T2 hyperintense with peripheral rim enhancement. On the adc map these all exhibit central increased signal with peripheral low signal. On the diffusion-weighted sequences there is peripheral restricted diffusion . In the setting of a known malignancy, these are favored to represent necrotic or cystic metastases. Within segment 4a there is a peripherally enhancing lesion measuring 1.5 x 0.8 cm with this shows restricted diffusion and is concerning for metastatic disease. Within segment 4b, there is a cystic lesion which measures 2.4 x 1.6 cm (image 44/13). This shows mild peripheral rim enhancement with central restricted diffusion. On the previous exam, this measured 3.3 x 2.8 cm. Also  concerning for metastatic disease. SABRA GALLBLADDER AND BILIARY SYSTEM: The gallbladder appears surgically absent. Status post cholecystectomy. Common bile duct stent is in place. No significant intrahepatic bile duct dilatation. SPLEEN: Unchanged splenomegaly. PANCREAS: The poorly defined infiltrative mass involving the head of the pancreas is difficult to measure given the infiltrative appearance. Similar appearance of soft tissue encasement of the celiac artery, superior mesenteric artery and the portal vein / portal venous confluence. ADRENAL GLANDS: Normal size and morphology bilaterally. No nodule, thickening, or hemorrhage. No periadrenal stranding. KIDNEYS:  Unremarkable. LYMPH NODES: No lymphadenopathy. VASCULATURE: Near complete occlusion of the portal venous confluence with distal reconstitution of the SMV and splenic vein. Intrahepatic portal vein remains patent as well as the portal vein proximal to the confluence. Increased upper abdominal varicosities are noted, likely reflecting portal venous hypertension. PERITONEUM: Moderate to large volume of ascites similar to 03/15/2024. BOWEL: Moderate stool burden noted throughout the colon. No dilated large or small bowel loops. Similar appearance of thickening of the antropyloric junction. ABDOMINAL WALL: No acute abnormality. BONES: No acute abnormality. LUNGS: Trace right pleural effusion. IMPRESSION: 1. Cluster of multiple cystic lesions in the posterior right hepatic lobe, favored necrotic or cystic metastases in the setting of known malignancy. 2. Peripherally enhancing lesion in segment 4a, suspicious for new liver metastasis. 3. Cystic lesion in segment 4b, decreased in size compared to prior exam. 4. Poorly defined infiltrative mass involving the pancreatic head with similar soft tissue encasement of the celiac artery, superior mesenteric artery, and portal venous confluence. 5. Near-complete occlusion of the portal venous confluence with distal reconstitution of the SMV and splenic vein; intrahepatic portal vein and portal vein proximal to the confluence remain patent. 6. Signs of portal venous hypertension with ascites, splenomegaly and increased upper abdominal varicosities. Electronically signed by: Waddell Calk MD 03/18/2024 05:22 AM EDT RP Workstation: [id number]   US  ABDOMEN LIMITED WITH LIVER DOPPLER Result Date: 03/15/2024 CLINICAL DATA:  Pancreatic head mass EXAM: DUPLEX ULTRASOUND OF LIVER TECHNIQUE: Color and duplex Doppler ultrasound was performed to evaluate the hepatic in-flow and out-flow vessels. COMPARISON:  CT from earlier in the same day. FINDINGS: Liver: The known mass lesion adjacent  to the gallbladder fossa is not well appreciated on this exam. Main Portal Vein size: 1.3 cm Portal Vein Velocities Main Prox:  65 cm/sec Right: 25 cm/sec Left: 18 cm/sec Hepatic Vein Velocities Right:  20 cm/sec Middle:  16 cm/sec Left:  28 cm/sec IVC: Present and patent with normal respiratory phasicity. Hepatic Artery Velocity:  51.9 cm/sec Splenic Vein Velocity:  14.7 cm/sec Spleen: 17.7 cm x 0.0 cm x 6.1 cm with a total volume of 395.9 cm^3 (411 cm^3 is upper limit normal) Portal Vein Occlusion/Thrombus: Absent Splenic Vein Occlusion/Thrombus: Absent Ascites: Present Varices: None IMPRESSION: No portal/hepatic venous occlusion is noted. No arterial abnormality is seen. The known pancreatic head mass is not as well appreciated as on recent CT. Known liver metastatic lesion is not well appreciated on this exam. Ascites is seen similar to that noted on prior CT. Spleen is at the upper limits of normal in size. Electronically Signed   By: Oneil Devonshire M.D.   On: 03/15/2024 19:51   CT ABDOMEN PELVIS W CONTRAST Result Date: 03/15/2024 CLINICAL DATA:  Recent paracentesis history of pancreas cancer worsening abdominal pain EXAM: CT ABDOMEN AND PELVIS WITH CONTRAST TECHNIQUE: Multidetector CT imaging of the abdomen and pelvis was performed using the standard protocol following bolus administration of intravenous contrast. RADIATION DOSE REDUCTION: This exam was performed  according to the departmental dose-optimization program which includes automated exposure control, adjustment of the mA and/or kV according to patient size and/or use of iterative reconstruction technique. CONTRAST:  OMNIPAQUE  IOHEXOL  300 MG/ML  SOLN COMPARISON:  CT 10/08/2023, 04/02/2023, 01/11/2023, MRI 12/16/2022 FINDINGS: Lower chest: Lung bases demonstrate no acute airspace disease. Vascular catheter tip at the cavoatrial junction. Hepatobiliary: Trace pneumobilia. Indwelling common duct stent with some gas in the stent at the porta  hepatis but mostly opacified appearance of the stent compared to the prior CT from May. Slight interval decrease in size of a hypodense low-density mass near the gallbladder fossa, today measuring 19 mm on series 2, image 24, previously 4.5 cm. Contracted gallbladder without calcified stone. Interim development of multiple cystic appearing lesions in the posterior right hepatic lobe, largest measuring up to 2.6 cm on series 2, image 19. Focal wedge-shaped hypo density within the posterior right hepatic lobe extending to the subcapsular surface, series 2, image 17. Pancreas: Poorly defined infiltrative proximal pancreatic mass as was seen on prior exams, difficult to measure given infiltrative appearance. Vascular involvement as was seen on prior, involving the proximal SMA, celiac bifurcation and portal splenic confluence. Intra hepatic portal vessels are patent. Spleen: Enlarged, craniocaudal measurement of 19 cm. Adrenals/Urinary Tract: Adrenal glands are normal. Kidneys show no significant hydronephrosis. The bladder is distended Stomach/Bowel: Abnormal masslike thickening of the pylorus and duodenal bulb, series 2, image 25 through 27. Fluid-filled loops of small bowel with mucosal enhancement but no obstructive features. Colon wall thickening, mucosal enhancement and indistinct appearance of the ascending, transverse, descending and proximal sigmoid colon. Moderate stool in the rectosigmoid colon. Vascular/Lymphatic: Mild atherosclerosis.  No aneurysm. Reproductive: Uterus unremarkable.  No adnexal mass Other: No free air. Moderate volume ascites within the abdomen and pelvis. Generalized subcutaneous edema. Musculoskeletal: Multilevel degenerative change. No acute osseous abnormality. IMPRESSION: 1. Poorly defined infiltrative proximal pancreatic mass with vascular involvement as was seen on prior exams and corresponding to the history of malignancy. 2. Decreased size of previously noted thick walled cystic  lesion in the right hepatic lobe near the porta hepatis however interim development of multiple cystic appearing lesions in the posterior right hepatic lobe, largest measuring up to 2.6 cm. Findings could be secondary to intra hepatic abscess versus metastatic disease. Somewhat wedge-shaped area of hypoattenuation adjacent to the multi cystic changes in the right hepatic lobe which may be due to altered perfusion. 3. Indwelling common duct stent with some gas in the stent at the porta hepatis but mostly opacified appearance of the stent compared to the prior CT from May. Some pneumobilia is present. 4. Abnormal masslike thickening of the pylorus and duodenal bulb, this could be inflammatory but infiltrative process/neoplastic involvement is also a consideration. 5. Fluid-filled loops of small bowel with mucosal enhancement but no obstructive features. Colon wall thickening, mucosal enhancement and indistinct appearance of the ascending, transverse, descending and proximal sigmoid colon. Findings are suspicious for enterocolitis. 6. Moderate volume ascites within the abdomen and pelvis. Generalized subcutaneous edema. 7. Splenomegaly. 8. Aortic atherosclerosis. Aortic Atherosclerosis (ICD10-I70.0). Electronically Signed   By: Luke Bun M.D.   On: 03/15/2024 16:24   DG Chest Portable 1 View Result Date: 03/15/2024 EXAM: 1 VIEW(S) XRAY OF THE CHEST 03/15/2024 01:40:46 PM COMPARISON: CT chest 01/11/2023 CLINICAL HISTORY: gen weakness, h/o cancer. General weakness, hx cancer FINDINGS: LINES, TUBES AND DEVICES: Right CT Port-A-Cath in place with tip at cavoatrial junction. LUNGS AND PLEURA: Low lung volumes. Elevated right hemidiaphragm. No focal  pulmonary opacity. No pulmonary edema. No pleural effusion. No pneumothorax. HEART AND MEDIASTINUM: No acute abnormality of the cardiac and mediastinal silhouettes. BONES AND SOFT TISSUES: No acute osseous abnormality. IMPRESSION: 1. No acute cardiopulmonary findings. 2.  Low lung volumes with mildly elevated right hemidiaphragm. Electronically signed by: Waddell Calk MD 03/15/2024 02:17 PM EDT RP Workstation: [id number]   US  Paracentesis Result Date: 03/14/2024 INDICATION: ASCITES Patient with history of metastatic pancreatic cancer with recurrent malignant ascites. Request received for therapeutic paracentesis. EXAM: ULTRASOUND GUIDED THERAPEUTIC PARACENTESIS MEDICATIONS: 8 mL 1% lidocaine  with epinephrine  to skin/subcutaneous tissue COMPLICATIONS: None immediate. PROCEDURE: Informed written consent was obtained from the patient after a discussion of the risks, benefits and alternatives to treatment. A timeout was performed prior to the initiation of the procedure. Initial ultrasound scanning demonstrates a moderate amount of ascites within the left mid to lower abdominal quadrant. The left mid to lower abdomen was prepped and draped in the usual sterile fashion. 1% lidocaine  was used for local anesthesia. Following this, a 19 gauge, 10-cm, Yueh catheter was introduced. An ultrasound image was saved for documentation purposes. The paracentesis was performed. The catheter was removed and a dressing was applied. The patient tolerated the procedure well without immediate post procedural complication. FINDINGS: A total of approximately 3.0 L of yellow fluid was removed. IMPRESSION: Successful ultrasound-guided therapeutic paracentesis yielding 3.0 L of peritoneal fluid. Performed by: Franky Rakers , PA-C under direct supervision of Thom Hall, MD Electronically Signed   By: Thom Hall M.D.   On: 03/14/2024 16:39     Subjective: Patient seen examined bedside, sitting on edge of bed.  No complaints.  Ready for discharge home.  Discussed treatment plan including 2-week treatment with antibiotics for bacteremia and need for outpatient follow-up for repeat labs, blood cultures.  Also discussed continue to hold home spironolactone  and furosemide  given hypotension and now started  on midodrine.  Patient with no other complaints or concerns at this time.  Denies headache, no visual changes, no chest pain, no palpitations, no dizziness, no shortness of breath, no abdominal pain, no fever/chills/night sweats, no nausea/vomitus or diarrhea, no focal weakness, no fatigue, no paresthesias.  No acute events overnight per nurse staff.  Discharge Exam: Vitals:   03/19/24 0505 03/19/24 1243  BP: (!) 96/59 91/60  Pulse: 66 69  Resp: 19 16  Temp: 98.3 F (36.8 C) 97.9 F (36.6 C)  SpO2: 99% 100%   Vitals:   03/18/24 2117 03/18/24 2118 03/19/24 0505 03/19/24 1243  BP: (!) 89/60 (!) 89/60 (!) 96/59 91/60  Pulse:  70 66 69  Resp:  17 19 16   Temp:  97.8 F (36.6 C) 98.3 F (36.8 C) 97.9 F (36.6 C)  TempSrc:  Oral Oral Oral  SpO2: 100% 100% 99% 100%  Weight:      Height:        Physical Exam: GEN: NAD, alert and oriented x 3, chronically ill appearance HEENT: NCAT, PERRL, EOMI, sclera clear, MMM PULM: CTAB w/o wheezes/crackles, normal respiratory effort, on room air CV: RRR w/o M/G/R GI: abd soft, NTND, + BS MSK: no peripheral edema, moves all extremity independently NEURO: No focal neurological deficit PSYCH: normal mood/affect Integumentary: No concerning rashes/lesions/wounds noted on exposed and surfaces    The results of significant diagnostics from this hospitalization (including imaging, microbiology, ancillary and laboratory) are listed below for reference.     Microbiology: Recent Results (from the past 240 hours)  Blood culture (routine x 2)     Status:  Abnormal   Collection Time: 03/15/24  1:38 PM   Specimen: BLOOD  Result Value Ref Range Status   Specimen Description   Final    BLOOD PORTA CATH Performed at [facility], [address], [place], [place] [address]    Special Requests   Final    BOTTLES DRAWN AEROBIC AND ANAEROBIC Blood Culture adequate volume Performed at [facility], [address], [place], [place] [address]    Culture  Setup Time   Final    GRAM NEGATIVE RODS IN BOTH AEROBIC AND ANAEROBIC BOTTLES L POINTDEXTER PHARM D 03/16/2024 @0237  BY DD Performed at [facility], [address], [place], [place] [address]    Culture KLEBSIELLA AEROGENES (A)  Final   Report Status 03/18/2024 FINAL  Final   Organism ID, Bacteria KLEBSIELLA AEROGENES  Final      Susceptibility   Klebsiella aerogenes - MIC*    CEFEPIME <=0.12 SENSITIVE Sensitive     ERTAPENEM <=0.12 SENSITIVE Sensitive     CEFTRIAXONE <=0.25 SENSITIVE Sensitive     CIPROFLOXACIN  <=0.06 SENSITIVE Sensitive     GENTAMICIN <=1 SENSITIVE Sensitive     MEROPENEM <=0.25 SENSITIVE Sensitive     TRIMETH/SULFA <=20 SENSITIVE Sensitive     PIP/TAZO Value in next row Sensitive      <=4 SENSITIVEThis is a modified FDA-approved test that has been validated and its performance characteristics determined by the reporting laboratory.  This laboratory is certified under the Clinical Laboratory Improvement Amendments CLIA as qualified to perform high complexity clinical laboratory testing.    * KLEBSIELLA AEROGENES  Resp panel by RT-PCR (RSV, Flu A&B, Covid) Anterior Nasal Swab     Status: None   Collection Time: 03/15/24  1:38 PM   Specimen: Anterior Nasal Swab  Result Value Ref Range Status   SARS Coronavirus 2 by RT PCR NEGATIVE NEGATIVE Final    Comment: (NOTE) SARS-CoV-2 target nucleic acids are NOT DETECTED.  The SARS-CoV-2 RNA is generally detectable in upper respiratory specimens during the acute phase of infection. The lowest concentration of SARS-CoV-2 viral copies this assay can detect is 138 copies/mL. A negative result does not preclude SARS-Cov-2 infection and should not be used as the sole basis for treatment or other patient management decisions. A negative result may occur with  improper specimen collection/handling, submission of specimen other than nasopharyngeal swab, presence of viral mutation(s)  within the areas targeted by this assay, and inadequate number of viral copies(<138 copies/mL). A negative result must be combined with clinical observations, patient history, and epidemiological information. The expected result is Negative.  Fact Sheet for Patients:  bloggercourse.com  Fact Sheet for Healthcare Providers:  seriousbroker.it  This test is no t yet approved or cleared by the [place]  FDA and  has been authorized for detection and/or diagnosis of SARS-CoV-2 by FDA under an Emergency Use Authorization (EUA). This EUA will remain  in effect (meaning this test can be used) for the duration of the COVID-19 declaration under Section 564(b)(1) of the Act, 21 U.S.C.section 360bbb-3(b)(1), unless the authorization is terminated  or revoked sooner.       Influenza A by PCR NEGATIVE NEGATIVE Final   Influenza B by PCR NEGATIVE NEGATIVE Final    Comment: (NOTE) The Xpert Xpress SARS-CoV-2/FLU/RSV plus assay is intended as an aid in the diagnosis of influenza from Nasopharyngeal swab specimens and should not be used as a sole basis for treatment. Nasal washings and aspirates are unacceptable for  Xpert Xpress SARS-CoV-2/FLU/RSV testing.  Fact Sheet for Patients: bloggercourse.com  Fact Sheet for Healthcare Providers: seriousbroker.it  This test is not yet approved or cleared by the [place]  FDA and has been authorized for detection and/or diagnosis of SARS-CoV-2 by FDA under an Emergency Use Authorization (EUA). This EUA will remain in effect (meaning this test can be used) for the duration of the COVID-19 declaration under Section 564(b)(1) of the Act, 21 U.S.C. section 360bbb-3(b)(1), unless the authorization is terminated or revoked.     Resp Syncytial Virus by PCR NEGATIVE NEGATIVE Final    Comment: (NOTE) Fact Sheet for  Patients: bloggercourse.com  Fact Sheet for Healthcare Providers: seriousbroker.it  This test is not yet approved or cleared by the [place]  FDA and has been authorized for detection and/or diagnosis of SARS-CoV-2 by FDA under an Emergency Use Authorization (EUA). This EUA will remain in effect (meaning this test can be used) for the duration of the COVID-19 declaration under Section 564(b)(1) of the Act, 21 U.S.C. section 360bbb-3(b)(1), unless the authorization is terminated or revoked.  Performed at [facility], [address], [place], [place] [address]   Blood Culture ID Panel (Reflexed)     Status: Abnormal   Collection Time: 03/15/24  1:38 PM  Result Value Ref Range Status   Enterococcus faecalis NOT DETECTED NOT DETECTED Final   Enterococcus Faecium NOT DETECTED NOT DETECTED Final   Listeria monocytogenes NOT DETECTED NOT DETECTED Final   Staphylococcus species NOT DETECTED NOT DETECTED Final   Staphylococcus aureus (BCID) NOT DETECTED NOT DETECTED Final   Staphylococcus epidermidis NOT DETECTED NOT DETECTED Final   Staphylococcus lugdunensis NOT DETECTED NOT DETECTED Final   Streptococcus species NOT DETECTED NOT DETECTED Final   Streptococcus agalactiae NOT DETECTED NOT DETECTED Final   Streptococcus pneumoniae NOT DETECTED NOT DETECTED Final   Streptococcus pyogenes NOT DETECTED NOT DETECTED Final   A.calcoaceticus-baumannii NOT DETECTED NOT DETECTED Final   Bacteroides fragilis NOT DETECTED NOT DETECTED Final   Enterobacterales DETECTED (A) NOT DETECTED Final    Comment: Enterobacterales represent a large order of gram negative bacteria, not a single organism. CRITICAL RESULT CALLED TO, READ BACK BY AND VERIFIED WITH: L POINTDEXTER PHARM D 03/16/2024 @0237  BY DD    Enterobacter cloacae complex NOT DETECTED NOT DETECTED Final   Escherichia coli NOT DETECTED NOT DETECTED Final   Klebsiella  aerogenes DETECTED (A) NOT DETECTED Final    Comment: CRITICAL RESULT CALLED TO, READ BACK BY AND VERIFIED WITH: L POINTDEXTER PHARM D 03/16/2024 @0237  BY DD    Klebsiella oxytoca NOT DETECTED NOT DETECTED Final   Klebsiella pneumoniae NOT DETECTED NOT DETECTED Final   Proteus species NOT DETECTED NOT DETECTED Final   Salmonella species NOT DETECTED NOT DETECTED Final   Serratia marcescens NOT DETECTED NOT DETECTED Final   Haemophilus influenzae NOT DETECTED NOT DETECTED Final   Neisseria meningitidis NOT DETECTED NOT DETECTED Final   Pseudomonas aeruginosa NOT DETECTED NOT DETECTED Final   Stenotrophomonas maltophilia NOT DETECTED NOT DETECTED Final   Candida albicans NOT DETECTED NOT DETECTED Final   Candida auris NOT DETECTED NOT DETECTED Final   Candida glabrata NOT DETECTED NOT DETECTED Final   Candida krusei NOT DETECTED NOT DETECTED Final   Candida parapsilosis NOT DETECTED NOT DETECTED Final   Candida tropicalis NOT DETECTED NOT DETECTED Final   Cryptococcus neoformans/gattii NOT DETECTED NOT DETECTED Final   CTX-M ESBL NOT DETECTED NOT DETECTED Final   Carbapenem resistance IMP NOT DETECTED NOT DETECTED Final  Carbapenem resistance KPC NOT DETECTED NOT DETECTED Final   Carbapenem resistance NDM NOT DETECTED NOT DETECTED Final   Carbapenem resist OXA 48 LIKE NOT DETECTED NOT DETECTED Final   Carbapenem resistance VIM NOT DETECTED NOT DETECTED Final    Comment: Performed at [facility], [address], [place], [place] [address]  Blood culture (routine x 2)     Status: Abnormal   Collection Time: 03/15/24  2:55 PM   Specimen: BLOOD  Result Value Ref Range Status   Specimen Description   Final    BLOOD BLOOD RIGHT ARM Performed at [facility], [address] [place], [place] [address]    Special Requests   Final    Blood Culture adequate volume BOTTLES DRAWN AEROBIC AND ANAEROBIC Performed at [facility], [address], [place], [place] [address]    Culture  Setup Time   Final    GRAM NEGATIVE RODS ANAEROBIC BOTTLE ONLY CRITICAL VALUE NOTED.  VALUE IS CONSISTENT WITH PREVIOUSLY REPORTED AND CALLED VALUE.    Culture (A)  Final    KLEBSIELLA AEROGENES SUSCEPTIBILITIES PERFORMED ON PREVIOUS CULTURE WITHIN THE LAST 5 DAYS. Performed at [facility], [address] [place], [place] [address]    Report Status 03/18/2024 FINAL  Final     Labs: BNP (last 3 results) No results for input(s): BNP in the last 8760 hours. Basic Metabolic Panel: Recent Labs  Lab 03/15/24 1338 03/16/24 0557 03/17/24 0110 03/18/24 0347 03/19/24 0355  NA 132* 131* 132* 132* 132*  K 2.6* 3.6 3.7 3.7 4.0  CL 98 99 101 101 101  CO2 24 24 24 24 24   GLUCOSE 108* 107* 134* 101* 123*  BUN 15 19 21  26* 24*  CREATININE 1.04* 0.87 1.02* 1.23* 1.01*  CALCIUM 8.0* 7.7* 7.8* 7.8* 7.9*  MG 1.7  --  2.0  --   --    Liver Function Tests: Recent Labs  Lab 03/15/24 1338 03/16/24 0557 03/17/24 0110 03/18/24 0347 03/19/24 0355  AST 144* 88* 58* 35 42*  ALT 115* 99* 79* 58* 62*  ALKPHOS 274* 225* 219* 163* 229*  BILITOT 1.0 0.6 0.8 1.0 0.9  PROT 5.2* 4.9* 4.9* 4.5* 4.8*  ALBUMIN 2.5* 2.3* 2.5* 2.3* 2.4*   Recent Labs  Lab 03/15/24 1338  LIPASE <10*   No results for input(s): AMMONIA in the last 168 hours. CBC: Recent Labs  Lab 03/15/24 1338 03/16/24 0557 03/17/24 0110 03/17/24 1909 03/18/24 0347 03/19/24 0856  WBC 3.1* 3.2* 3.1*  --  3.9* 2.7*  NEUTROABS 2.7  --   --   --   --   --   HGB 8.9* 7.0* 7.4* 8.4* 8.2* 9.6*  HCT 29.7* 23.0* 23.8* 26.7* 26.2* 31.1*  MCV 87.6 88.5 87.5  --  88.2 88.6  PLT 78* 82* 68*  --  49* 47*   Cardiac Enzymes: No results for input(s): CKTOTAL, CKMB, CKMBINDEX, TROPONINI in the last 168 hours. BNP: Invalid input(s): POCBNP CBG: No results for input(s): GLUCAP in the last 168 hours. D-Dimer No results for input(s): DDIMER in the last 72 hours. Hgb  A1c No results for input(s): HGBA1C in the last 72 hours. Lipid Profile No results for input(s): CHOL, HDL, LDLCALC, TRIG, CHOLHDL, LDLDIRECT in the last 72 hours. Thyroid  function studies No results for input(s): TSH, T4TOTAL, T3FREE, THYROIDAB in the last 72 hours.  Invalid input(s): FREET3 Anemia work up No results for input(s): VITAMINB12, FOLATE, FERRITIN, TIBC, IRON, RETICCTPCT  in the last 72 hours. Urinalysis    Component Value Date/Time   COLORURINE AMBER (A) 04/02/2023 1605   APPEARANCEUR HAZY (A) 04/02/2023 1605   LABSPEC 1.028 04/02/2023 1605   PHURINE 5.0 04/02/2023 1605   GLUCOSEU NEGATIVE 04/02/2023 1605   HGBUR NEGATIVE 04/02/2023 1605   BILIRUBINUR NEGATIVE 04/02/2023 1605   KETONESUR 20 (A) 04/02/2023 1605   PROTEINUR 30 (A) 04/02/2023 1605   NITRITE NEGATIVE 04/02/2023 1605   LEUKOCYTESUR TRACE (A) 04/02/2023 1605   Sepsis Labs Recent Labs  Lab 03/16/24 0557 03/17/24 0110 03/18/24 0347 03/19/24 0856  WBC 3.2* 3.1* 3.9* 2.7*   Microbiology Recent Results (from the past 240 hours)  Blood culture (routine x 2)     Status: Abnormal   Collection Time: 03/15/24  1:38 PM   Specimen: BLOOD  Result Value Ref Range Status   Specimen Description   Final    BLOOD PORTA CATH Performed at [facility], [address], [place], [place] [address]    Special Requests   Final    BOTTLES DRAWN AEROBIC AND ANAEROBIC Blood Culture adequate volume Performed at [facility], [address], [place], [place] [address]    Culture  Setup Time   Final    GRAM NEGATIVE RODS IN BOTH AEROBIC AND ANAEROBIC BOTTLES L POINTDEXTER PHARM D 03/16/2024 @0237  BY DD Performed at [facility], [address], [place], [place] [address]    Culture KLEBSIELLA AEROGENES (A)  Final   Report Status 03/18/2024 FINAL  Final   Organism ID, Bacteria KLEBSIELLA AEROGENES  Final      Susceptibility   Klebsiella  aerogenes - MIC*    CEFEPIME <=0.12 SENSITIVE Sensitive     ERTAPENEM <=0.12 SENSITIVE Sensitive     CEFTRIAXONE <=0.25 SENSITIVE Sensitive     CIPROFLOXACIN  <=0.06 SENSITIVE Sensitive     GENTAMICIN <=1 SENSITIVE Sensitive     MEROPENEM <=0.25 SENSITIVE Sensitive     TRIMETH/SULFA <=20 SENSITIVE Sensitive     PIP/TAZO Value in next row Sensitive      <=4 SENSITIVEThis is a modified FDA-approved test that has been validated and its performance characteristics determined by the reporting laboratory.  This laboratory is certified under the Clinical Laboratory Improvement Amendments CLIA as qualified to perform high complexity clinical laboratory testing.    * KLEBSIELLA AEROGENES  Resp panel by RT-PCR (RSV, Flu A&B, Covid) Anterior Nasal Swab     Status: None   Collection Time: 03/15/24  1:38 PM   Specimen: Anterior Nasal Swab  Result Value Ref Range Status   SARS Coronavirus 2 by RT PCR NEGATIVE NEGATIVE Final    Comment: (NOTE) SARS-CoV-2 target nucleic acids are NOT DETECTED.  The SARS-CoV-2 RNA is generally detectable in upper respiratory specimens during the acute phase of infection. The lowest concentration of SARS-CoV-2 viral copies this assay can detect is 138 copies/mL. A negative result does not preclude SARS-Cov-2 infection and should not be used as the sole basis for treatment or other patient management decisions. A negative result may occur with  improper specimen collection/handling, submission of specimen other than nasopharyngeal swab, presence of viral mutation(s) within the areas targeted by this assay, and inadequate number of viral copies(<138 copies/mL). A negative result must be combined with clinical observations, patient history, and epidemiological information. The expected result is Negative.  Fact Sheet for Patients:  bloggercourse.com  Fact Sheet for Healthcare Providers:  seriousbroker.it  This test is  no t yet approved or cleared by  the [place]  FDA and  has been authorized for detection and/or diagnosis of SARS-CoV-2 by FDA under an Emergency Use Authorization (EUA). This EUA will remain  in effect (meaning this test can be used) for the duration of the COVID-19 declaration under Section 564(b)(1) of the Act, 21 U.S.C.section 360bbb-3(b)(1), unless the authorization is terminated  or revoked sooner.       Influenza A by PCR NEGATIVE NEGATIVE Final   Influenza B by PCR NEGATIVE NEGATIVE Final    Comment: (NOTE) The Xpert Xpress SARS-CoV-2/FLU/RSV plus assay is intended as an aid in the diagnosis of influenza from Nasopharyngeal swab specimens and should not be used as a sole basis for treatment. Nasal washings and aspirates are unacceptable for Xpert Xpress SARS-CoV-2/FLU/RSV testing.  Fact Sheet for Patients: bloggercourse.com  Fact Sheet for Healthcare Providers: seriousbroker.it  This test is not yet approved or cleared by the [place]  FDA and has been authorized for detection and/or diagnosis of SARS-CoV-2 by FDA under an Emergency Use Authorization (EUA). This EUA will remain in effect (meaning this test can be used) for the duration of the COVID-19 declaration under Section 564(b)(1) of the Act, 21 U.S.C. section 360bbb-3(b)(1), unless the authorization is terminated or revoked.     Resp Syncytial Virus by PCR NEGATIVE NEGATIVE Final    Comment: (NOTE) Fact Sheet for Patients: bloggercourse.com  Fact Sheet for Healthcare Providers: seriousbroker.it  This test is not yet approved or cleared by the [place]  FDA and has been authorized for detection and/or diagnosis of SARS-CoV-2 by FDA under an Emergency Use Authorization (EUA). This EUA will remain in effect (meaning this test can be used) for the duration of the COVID-19 declaration under Section  564(b)(1) of the Act, 21 U.S.C. section 360bbb-3(b)(1), unless the authorization is terminated or revoked.  Performed at [facility], [address], [place], [place] [address]   Blood Culture ID Panel (Reflexed)     Status: Abnormal   Collection Time: 03/15/24  1:38 PM  Result Value Ref Range Status   Enterococcus faecalis NOT DETECTED NOT DETECTED Final   Enterococcus Faecium NOT DETECTED NOT DETECTED Final   Listeria monocytogenes NOT DETECTED NOT DETECTED Final   Staphylococcus species NOT DETECTED NOT DETECTED Final   Staphylococcus aureus (BCID) NOT DETECTED NOT DETECTED Final   Staphylococcus epidermidis NOT DETECTED NOT DETECTED Final   Staphylococcus lugdunensis NOT DETECTED NOT DETECTED Final   Streptococcus species NOT DETECTED NOT DETECTED Final   Streptococcus agalactiae NOT DETECTED NOT DETECTED Final   Streptococcus pneumoniae NOT DETECTED NOT DETECTED Final   Streptococcus pyogenes NOT DETECTED NOT DETECTED Final   A.calcoaceticus-baumannii NOT DETECTED NOT DETECTED Final   Bacteroides fragilis NOT DETECTED NOT DETECTED Final   Enterobacterales DETECTED (A) NOT DETECTED Final    Comment: Enterobacterales represent a large order of gram negative bacteria, not a single organism. CRITICAL RESULT CALLED TO, READ BACK BY AND VERIFIED WITH: L POINTDEXTER PHARM D 03/16/2024 @0237  BY DD    Enterobacter cloacae complex NOT DETECTED NOT DETECTED Final   Escherichia coli NOT DETECTED NOT DETECTED Final   Klebsiella aerogenes DETECTED (A) NOT DETECTED Final    Comment: CRITICAL RESULT CALLED TO, READ BACK BY AND VERIFIED WITH: L POINTDEXTER PHARM D 03/16/2024 @0237  BY DD    Klebsiella oxytoca NOT DETECTED NOT DETECTED Final   Klebsiella pneumoniae NOT DETECTED NOT DETECTED Final   Proteus species NOT DETECTED NOT DETECTED Final   Salmonella species NOT DETECTED NOT DETECTED Final  Serratia marcescens NOT DETECTED NOT DETECTED Final   Haemophilus  influenzae NOT DETECTED NOT DETECTED Final   Neisseria meningitidis NOT DETECTED NOT DETECTED Final   Pseudomonas aeruginosa NOT DETECTED NOT DETECTED Final   Stenotrophomonas maltophilia NOT DETECTED NOT DETECTED Final   Candida albicans NOT DETECTED NOT DETECTED Final   Candida auris NOT DETECTED NOT DETECTED Final   Candida glabrata NOT DETECTED NOT DETECTED Final   Candida krusei NOT DETECTED NOT DETECTED Final   Candida parapsilosis NOT DETECTED NOT DETECTED Final   Candida tropicalis NOT DETECTED NOT DETECTED Final   Cryptococcus neoformans/gattii NOT DETECTED NOT DETECTED Final   CTX-M ESBL NOT DETECTED NOT DETECTED Final   Carbapenem resistance IMP NOT DETECTED NOT DETECTED Final   Carbapenem resistance KPC NOT DETECTED NOT DETECTED Final   Carbapenem resistance NDM NOT DETECTED NOT DETECTED Final   Carbapenem resist OXA 48 LIKE NOT DETECTED NOT DETECTED Final   Carbapenem resistance VIM NOT DETECTED NOT DETECTED Final    Comment: Performed at [facility], [address], [place], [place] [address]  Blood culture (routine x 2)     Status: Abnormal   Collection Time: 03/15/24  2:55 PM   Specimen: BLOOD  Result Value Ref Range Status   Specimen Description   Final    BLOOD BLOOD RIGHT ARM Performed at [facility], [address], [place], [place] [address]    Special Requests   Final    Blood Culture adequate volume BOTTLES DRAWN AEROBIC AND ANAEROBIC Performed at [facility], [address] [place], [place] [address]    Culture  Setup Time   Final    GRAM NEGATIVE RODS ANAEROBIC BOTTLE ONLY CRITICAL VALUE NOTED.  VALUE IS CONSISTENT WITH PREVIOUSLY REPORTED AND CALLED VALUE.    Culture (A)  Final    KLEBSIELLA AEROGENES SUSCEPTIBILITIES PERFORMED ON PREVIOUS CULTURE WITHIN THE LAST 5 DAYS. Performed at [facility], [address], [place], [place] [address]    Report Status 03/18/2024 FINAL  Final     Time  coordinating discharge: Over 30 minutes  SIGNED:   Camellia PARAS Bliss Tsang, DO  [facility] 03/19/2024, 1:48 PM

## 2024-03-20 ENCOUNTER — Telehealth: Payer: Self-pay

## 2024-03-20 NOTE — Transitions of Care (Post Inpatient/ED Visit) (Unsigned)
   03/20/2024  Name: Nicole Johnson MRN: [id number] DOB: [birth year]  Today's TOC FU Call Status: Today's TOC FU Call Status:: Unsuccessful Call (1st Attempt) Unsuccessful Call (1st Attempt) Date: 03/20/24  Attempted to reach the patient regarding the most recent Inpatient/ED visit.  Follow Up Plan: Additional outreach attempts will be made to reach the patient to complete the Transitions of Care (Post Inpatient/ED visit) call.   Signature Julian Lemmings, LPN [facility] Nurse Health Advisor Direct Dial [phone]

## 2024-03-21 DIAGNOSIS — D630 Anemia in neoplastic disease: Secondary | ICD-10-CM | POA: Diagnosis not present

## 2024-03-21 DIAGNOSIS — C250 Malignant neoplasm of head of pancreas: Secondary | ICD-10-CM | POA: Diagnosis not present

## 2024-03-21 DIAGNOSIS — R7881 Bacteremia: Secondary | ICD-10-CM | POA: Diagnosis not present

## 2024-03-21 DIAGNOSIS — Z923 Personal history of irradiation: Secondary | ICD-10-CM | POA: Diagnosis not present

## 2024-03-21 DIAGNOSIS — Z9221 Personal history of antineoplastic chemotherapy: Secondary | ICD-10-CM | POA: Diagnosis not present

## 2024-03-21 DIAGNOSIS — D6859 Other primary thrombophilia: Secondary | ICD-10-CM | POA: Diagnosis not present

## 2024-03-21 DIAGNOSIS — Z87891 Personal history of nicotine dependence: Secondary | ICD-10-CM | POA: Diagnosis not present

## 2024-03-21 DIAGNOSIS — R932 Abnormal findings on diagnostic imaging of liver and biliary tract: Secondary | ICD-10-CM | POA: Diagnosis not present

## 2024-03-21 DIAGNOSIS — D709 Neutropenia, unspecified: Secondary | ICD-10-CM | POA: Diagnosis not present

## 2024-03-21 DIAGNOSIS — D61818 Other pancytopenia: Secondary | ICD-10-CM | POA: Diagnosis not present

## 2024-03-21 DIAGNOSIS — K8681 Exocrine pancreatic insufficiency: Secondary | ICD-10-CM | POA: Diagnosis not present

## 2024-03-21 NOTE — Transitions of Care (Post Inpatient/ED Visit) (Unsigned)
   03/21/2024  Name: LATORI BEGGS MRN: [id number] DOB: Jan 18, 1961  Today's TOC FU Call Status: Today's TOC FU Call Status:: Unsuccessful Call (2nd Attempt) Unsuccessful Call (1st Attempt) Date: 03/20/24 Unsuccessful Call (2nd Attempt) Date: 03/21/24  Attempted to reach the patient regarding the most recent Inpatient/ED visit.  Follow Up Plan: Additional outreach attempts will be made to reach the patient to complete the Transitions of Care (Post Inpatient/ED visit) call.   Signature Julian Lemmings, LPN [facility] Nurse Health Advisor Direct Dial [phone]

## 2024-03-22 DIAGNOSIS — I81 Portal vein thrombosis: Secondary | ICD-10-CM | POA: Diagnosis not present

## 2024-03-22 DIAGNOSIS — C250 Malignant neoplasm of head of pancreas: Secondary | ICD-10-CM | POA: Diagnosis not present

## 2024-03-22 DIAGNOSIS — C787 Secondary malignant neoplasm of liver and intrahepatic bile duct: Secondary | ICD-10-CM | POA: Diagnosis not present

## 2024-03-22 DIAGNOSIS — R188 Other ascites: Secondary | ICD-10-CM | POA: Diagnosis not present

## 2024-03-22 NOTE — Transitions of Care (Post Inpatient/ED Visit) (Signed)
 03/22/2024  Name: Nicole Johnson MRN: [id number] DOB: [birth year]  Today's TOC FU Call Status: Today's TOC FU Call Status:: Successful TOC FU Call Completed Unsuccessful Call (1st Attempt) Date: 03/20/24 Unsuccessful Call (2nd Attempt) Date: 03/21/24 [facility] FU Call Complete Date: 03/22/24 Patient's Name and Date of Birth confirmed.  Transition Care Management Follow-up Telephone Call Date of Discharge: 03/19/24 Discharge Facility: [facility]) Type of Discharge: Inpatient Admission Primary Inpatient Discharge Diagnosis:: gastroenteritis How have you been since you were released from the hospital?: Better Any questions or concerns?: No  Items Reviewed: Did you receive and understand the discharge instructions provided?: Yes Medications obtained,verified, and reconciled?: Yes (Medications Reviewed) Any new allergies since your discharge?: No Dietary orders reviewed?: Yes Do you have support at home?: Yes People in Home [RPT]: spouse  Medications Reviewed Today: Medications Reviewed Today     Reviewed by Emmitt Pan, LPN (Licensed Practical Nurse) on 03/22/24 at 1031  Med List Status: <None>   Medication Order Taking? Sig Documenting Provider Last Dose Status Informant  ADVIL 200 MG CAPS [id number] Yes Take 400 mg by mouth every 6 (six) hours as needed (for pain- rotate with 1,300 mg of Tylenol ). [provider]  Active Self  ciprofloxacin  (CIPRO ) 500 MG tablet [id number] Yes Take 1 tablet (500 mg total) by mouth 2 (two) times daily for 14 days. [place], Camellia PARAS, DO  Active   Cyanocobalamin  (VITAMIN B-12 PO) [id number] Yes Place 1 tablet under the tongue daily. [provider]  Active Self           Med Note MARISA, NATHANEL SAILOR   Thu Mar 15, 2024  4:53 PM) Strength not recalled  FULPHILA 6 MG/0.6ML injection [id number] Yes Inject 12 mg into the skin See admin instructions. Inject 12 mg into the skin every other Wednesday [provider]  Active Self   furosemide  (LASIX ) 40 MG tablet [id number]  Take 40 mg by mouth in the morning.  Patient not taking: Reported on 03/22/2024   [provider]  Active Self  MAGNESIUM PO [id number] Yes Take 2 tablets by mouth daily. [provider]  Active Self           Med Note MARISA, NATHANEL SAILOR   Thu Mar 15, 2024  5:24 PM) Strength not recalled  midodrine (PROAMATINE) 10 MG tablet [id number] Yes Take 1 tablet (10 mg total) by mouth 3 (three) times daily with meals. [place], Camellia PARAS, DO  Active   ondansetron  (ZOFRAN ) 8 MG tablet [id number] Yes Take 8 mg by mouth 2 (two) times daily as needed for nausea or vomiting. [provider]  Active Self  PRILOSEC OTC 20 MG tablet [id number] Yes Take 20 mg by mouth 2 (two) times daily before a meal. [provider]  Active Self  prochlorperazine  (COMPAZINE ) 10 MG tablet [id number] Yes Take 10 mg by mouth every 6 (six) hours as needed for nausea or vomiting. [provider]  Active Self  spironolactone  (ALDACTONE ) 100 MG tablet [id number]  Take 100 mg by mouth daily.  Patient not taking: Reported on 03/22/2024   [provider]  Active Self  TYLENOL  8 HOUR ARTHRITIS PAIN 650 MG CR tablet [id number] Yes Take 1,300 mg by mouth every 8 (eight) hours as needed for pain (rotate with 400 mg of Advil). [provider]  Active Self            Home Care and Equipment/Supplies: Were Home Health Services Ordered?: NA Any new equipment or medical supplies  ordered?: NA  Functional Questionnaire: Do you need assistance with bathing/showering or dressing?: No Do you need assistance with meal preparation?: No Do you need assistance with eating?: No Do you have difficulty maintaining continence: No Do you need assistance with getting out of bed/getting out of a chair/moving?: No  Follow up appointments reviewed: PCP Follow-up appointment confirmed?: Yes Date of PCP follow-up appointment?: 03/23/24 Follow-up Provider:  tower Specialist [facility] Follow-up appointment confirmed?: Yes Date of Specialist follow-up appointment?: 03/21/24 Follow-Up Specialty Provider:: onco Do you need transportation to your follow-up appointment?: No Do you understand care options if your condition(s) worsen?: Yes-patient verbalized understanding    SIGNATURE Julian Lemmings, LPN [facility] Nurse Health Advisor Direct Dial [phone]

## 2024-03-23 ENCOUNTER — Encounter: Payer: Self-pay | Admitting: Family Medicine

## 2024-03-23 ENCOUNTER — Ambulatory Visit: Admitting: Family Medicine

## 2024-03-23 VITALS — BP 89/55 | HR 62 | Temp 97.4°F | Ht 66.0 in | Wt 151.4 lb

## 2024-03-23 DIAGNOSIS — D61818 Other pancytopenia: Secondary | ICD-10-CM

## 2024-03-23 DIAGNOSIS — R600 Localized edema: Secondary | ICD-10-CM

## 2024-03-23 DIAGNOSIS — I959 Hypotension, unspecified: Secondary | ICD-10-CM

## 2024-03-23 DIAGNOSIS — C250 Malignant neoplasm of head of pancreas: Secondary | ICD-10-CM

## 2024-03-23 DIAGNOSIS — E871 Hypo-osmolality and hyponatremia: Secondary | ICD-10-CM

## 2024-03-23 DIAGNOSIS — K7460 Unspecified cirrhosis of liver: Secondary | ICD-10-CM

## 2024-03-23 DIAGNOSIS — R7881 Bacteremia: Secondary | ICD-10-CM

## 2024-03-23 DIAGNOSIS — R188 Other ascites: Secondary | ICD-10-CM | POA: Diagnosis not present

## 2024-03-23 DIAGNOSIS — R110 Nausea: Secondary | ICD-10-CM

## 2024-03-23 DIAGNOSIS — D696 Thrombocytopenia, unspecified: Secondary | ICD-10-CM

## 2024-03-23 NOTE — Progress Notes (Signed)
 Subjective:    Patient ID: Nicole Johnson, female    DOB: 1961-05-20, 63 y.o.   MRN: [id number]  HPI  Wt Readings from Last 3 Encounters:  03/23/24 151 lb 6 oz (68.7 kg)  03/15/24 142 lb 11.2 oz (64.7 kg)  01/18/24 138 lb 4 oz (62.7 kg)   24.43 kg/m  Vitals:   03/23/24 1155 03/23/24 1240  BP: (!) 88/48 (!) 89/55  Pulse: 62   Temp: (!) 97.4 F (36.3 C)   SpO2: 100%     Pt presents for hospital follow up / general weakness   Klebsiella bacteremia (after paracentesis) Cefepime, transitioned to cipro   Saw ED  2 week course    Hypotension  Recommended IV albumin Also minodrine  Holding home spironolactone  and furosemide  - instructed for 1 week   Pancreatic adenocarcinoma  Concern for progressive mets  Cirrhosis with ascites and transaminitis  New cystic hepatic lesions  ? Liver mets Close onc follow up  Pancytopenia  Lab Results  Component Value Date   WBC 2.7 (L) 03/19/2024   HGB 9.6 (L) 03/19/2024   HCT 31.1 (L) 03/19/2024   MCV 88.6 03/19/2024   PLT 47 (L) 03/19/2024   Was trasfused 2 u prbc    Lab Results  Component Value Date   NA 132 (L) 03/19/2024   K 4.0 03/19/2024   CO2 24 03/19/2024   GLUCOSE 123 (H) 03/19/2024   BUN 24 (H) 03/19/2024   CREATININE 1.01 (H) 03/19/2024   CALCIUM 7.9 (L) 03/19/2024   GFR 66.88 01/18/2024   GFRNONAA >60 03/19/2024   Lab Results  Component Value Date   ALT 62 (H) 03/19/2024   AST 42 (H) 03/19/2024   ALKPHOS 229 (H) 03/19/2024   BILITOT 0.9 03/19/2024    Per discharge summary  Recommendations for Outpatient Follow-up:  Follow up with PCP in 1-2 weeks Follow-up with gastroenterology, Dr. Rollin 2-3 weeks Continue ciprofloxacin  to complete 2-week course for Klebsiella bacteremia Started on midodrine for hypotension related to infection versus cirrhosis Recommend holding Lasix  and spironolactone  x 1 week and may restart if blood pressure improves Follow-up with medical oncology as scheduled Please obtain  CMP/CBC, repeat blood cultures in 2 weeks after completes antibiotic course Recommend goals of care discussion outpatient with patient's established physicians/care team given metastatic pancreatic cancer with noted progression and with new liver lesion noted on MRI while inpatient.     Onc visit [facility] 10/29 ASSESSMENT & PLAN  Metastatic Pancreatic Cancer: Initially treated for LAPC with FOLFOX with addition of GCSF, but c/b TCP so FOLFOX stopped and now s/p chemoradiation completed 05/17/23 with metastatic recurrence 12/2023. Started gemcitabine/nab-pacli, c/b now recent bacteremia, so tx on hold.   PLAN: - Hold gemcitabine and add reduced dose nab-paclitaxel (50%). - Will hold off on her (home) administration of GCSF - Hematologic support as below  Biliary Sludge: LFTs normal now s/p ERCP 01/2024.  Perforated Cholecystitis: Resolved. S/p interval percutaneous cholecystostomy tube. Liver fluid collection decreased in size.   Pancytopenia with anemia, thrombocytopenia, and neutropenia secondary to malignancy: Improved plts. Still anemic but she lives in Hgb 7 range. Hold off on tfusion 03/07/24.  - S/p iron 02/22/24 - Administer platelet boosting injection at hematologist's office. - Start oral vitamin B12 supplementation.    10/29 Na 131 K of 3.8  Ca 7.8 Alk phos 199 Alb 2.3 Protein total 5.5  Ast 25 Alt 43  Ca 19-9 267  Hb 9.5 Wbc 4.6   Today Is nauseated  Holding off  chemo   Back hurts more today   More fluid in her legs (gets this way from antibiotics)  Was uncomfortable Props feet up   Will likely see GI next week   Started cipro  on Monday -for 14 days  Tries to take with food  No appetite   Has not checked temp   Has zofran   Taking minodrine tid  Has not had her mid day dose yet  Not too bad /light headed    Sees onc in 2 weeks  Also hematology next Friday   Patient Active Problem List   Diagnosis Date Noted   Bacteremia 03/24/2024   Nausea  03/24/2024   Cirrhosis of liver (HCC) 03/24/2024   Hepatitis 03/15/2024   Ascites 01/11/2024   Portal hypertension (HCC) 10/24/2023   Hypotension 10/24/2023   Protein-calorie malnutrition, severe 10/11/2023   Hypothyroidism 10/08/2023   Moderate protein malnutrition 10/08/2023   Pancytopenia (HCC) 10/08/2023   Symptomatic anemia 10/08/2023   Pain of left thumb 08/25/2023   Bilateral lower extremity edema 04/08/2023   Thrombocytopenia 04/03/2023   Hyponatremia 04/03/2023   Genetic testing 01/28/2023   Pancreatic cancer (HCC) 01/06/2023   Jaundice 12/16/2022   Allergic rhinitis 09/09/2022   Routine general medical examination at a health care facility 01/01/2021   Muscle cramps 07/10/2020   Current use of proton pump inhibitor 07/10/2020   B12 deficiency 07/10/2020   Prediabetes 12/26/2019   Gastroesophageal reflux disease 12/02/2017   Stress incontinence of urine 07/26/2016   Past Medical History:  Diagnosis Date   Arthritis    Cancer (HCC)    pancreatic   GERD (gastroesophageal reflux disease)    PONV (postoperative nausea and vomiting)    Prediabetes    Past Surgical History:  Procedure Laterality Date   BILIARY BRUSHING  12/17/2022   Procedure: BILIARY BRUSHING;  Surgeon: Rollin Dover, MD;  Location: [facility];  Service: Gastroenterology;;   BILIARY STENT PLACEMENT N/A 12/17/2022   Procedure: BILIARY STENT PLACEMENT;  Surgeon: Rollin Dover, MD;  Location: [facility];  Service: Gastroenterology;  Laterality: N/A;   BILIARY STENT PLACEMENT N/A 04/05/2023   Procedure: BILIARY STENT PLACEMENT;  Surgeon: Rollin Dover, MD;  Location: [facility];  Service: Gastroenterology;  Laterality: N/A;   COLONOSCOPY N/A 10/11/2023   Procedure: COLONOSCOPY;  Surgeon: Rollin Dover, MD;  Location: [facility];  Service: Gastroenterology;  Laterality: N/A;   ERCP N/A 12/17/2022   Procedure: ENDOSCOPIC RETROGRADE CHOLANGIOPANCREATOGRAPHY (ERCP);  Surgeon: Rollin Dover, MD;   Location: [facility];  Service: Gastroenterology;  Laterality: N/A;   ERCP N/A 04/05/2023   Procedure: ENDOSCOPIC RETROGRADE CHOLANGIOPANCREATOGRAPHY (ERCP);  Surgeon: Rollin Dover, MD;  Location: [facility];  Service: Gastroenterology;  Laterality: N/A;   ESOPHAGOGASTRODUODENOSCOPY N/A 10/11/2023   Procedure: EGD (ESOPHAGOGASTRODUODENOSCOPY);  Surgeon: Rollin Dover, MD;  Location: [facility];  Service: Gastroenterology;  Laterality: N/A;   ESOPHAGOGASTRODUODENOSCOPY (EGD) WITH PROPOFOL  N/A 12/18/2022   Procedure: ESOPHAGOGASTRODUODENOSCOPY (EGD) WITH PROPOFOL ;  Surgeon: Rollin Dover, MD;  Location: [facility];  Service: Gastroenterology;  Laterality: N/A;   ESOPHAGOGASTRODUODENOSCOPY (EGD) WITH PROPOFOL  N/A 12/30/2022   Procedure: ESOPHAGOGASTRODUODENOSCOPY (EGD) WITH PROPOFOL ;  Surgeon: Rollin Dover, MD;  Location[facility];  Service: Gastroenterology;  Laterality: N/A;   EUS N/A 12/18/2022   Procedure: UPPER ENDOSCOPIC ULTRASOUND (EUS) LINEAR;  Surgeon: Rollin Dover, MD;  Location: [facility];  Service: Gastroenterology;  Laterality: N/A;   FINE NEEDLE ASPIRATION N/A 12/18/2022   Procedure: FINE NEEDLE ASPIRATION (FNA) LINEAR;  Surgeon: Rollin Dover, MD;  Location: [facility];  Service: Gastroenterology;  Laterality: N/A;   FINE NEEDLE ASPIRATION  12/30/2022   Procedure: FINE NEEDLE ASPIRATION (FNA) LINEAR;  Surgeon: Rollin Dover, MD;  Location[facility];  Service: Gastroenterology;;   IR PARACENTESIS  01/17/2024   IR PARACENTESIS  02/15/2024   LAPAROSCOPIC ABDOMINAL EXPLORATION     SPHINCTEROTOMY  12/17/2022   Procedure: ANNETT;  Surgeon: Rollin Dover, MD;  Location: [facility];  Service: Gastroenterology;;   CLEDA REMOVAL  04/05/2023   Procedure: STENT REMOVAL;  Surgeon: Rollin Dover, MD;  Location: [facility];  Service: Gastroenterology;;   UPPER ESOPHAGEAL ENDOSCOPIC ULTRASOUND (EUS) N/A 12/30/2022   Procedure: UPPER ESOPHAGEAL ENDOSCOPIC ULTRASOUND (EUS);   Surgeon: Rollin Dover, MD;  Location[facility] ENDOSCOPY;  Service: Gastroenterology;  Laterality: N/A;   Social History   Tobacco Use   Smoking status: Never   Smokeless tobacco: Never  Vaping Use   Vaping status: Never Used  Substance Use Topics   Alcohol  use: Not Currently    Comment: occ   Drug use: Never   Family History  Problem Relation Age of Onset   Diabetes Mother    Stroke Mother    Lung cancer Father 45       smoked   Diabetes Father    Diabetes Sister    Breast cancer Sister 32   Diabetes Brother    Cancer Brother 60       neck cancer   Heart disease Maternal Grandmother    Breast cancer Maternal Grandmother        dx. <50, double mastectomy   Heart disease Maternal Grandfather    Allergies  Allergen Reactions   Other Nausea Only and Other (See Comments)    Certain types of Anesthesia cause SEVERE NAUSEA   Codeine Nausea Only   Hydrocodone -Acetaminophen  Nausea Only and Other (See Comments)    Reaction unconfirmed,ed/might be nausea   Meloxicam Nausea Only   Tramadol Nausea Only   Current Outpatient Medications on File Prior to Visit  Medication Sig Dispense Refill   ADVIL 200 MG CAPS Take 400 mg by mouth every 6 (six) hours as needed (for pain- rotate with 1,300 mg of Tylenol ).     ciprofloxacin  (CIPRO ) 500 MG tablet Take 1 tablet (500 mg total) by mouth 2 (two) times daily for 14 days. 28 tablet 0   Cyanocobalamin  (VITAMIN B-12 PO) Place 1 tablet under the tongue daily.     FULPHILA 6 MG/0.6ML injection Inject 12 mg into the skin See admin instructions. Inject 12 mg into the skin every other Wednesday     MAGNESIUM PO Take 2 tablets by mouth daily.     midodrine (PROAMATINE) 10 MG tablet Take 1 tablet (10 mg total) by mouth 3 (three) times daily with meals. 270 tablet 0   ondansetron  (ZOFRAN ) 8 MG tablet Take 8 mg by mouth 2 (two) times daily as needed for nausea or vomiting.     PRILOSEC OTC 20 MG tablet Take 20 mg by mouth 2 (two) times daily before a  meal.     prochlorperazine  (COMPAZINE ) 10 MG tablet Take 10 mg by mouth every 6 (six) hours as needed for nausea or vomiting.     [Paused] furosemide  (LASIX ) 40 MG tablet Take 40 mg by mouth in the morning. (Patient not taking: Reported on 03/23/2024)     [Paused] spironolactone  (ALDACTONE ) 100 MG tablet Take 100 mg by mouth daily. (Patient not taking: Reported on 03/23/2024)     TYLENOL  8 HOUR ARTHRITIS PAIN 650 MG CR tablet Take 1,300 mg  by mouth every 8 (eight) hours as needed for pain (rotate with 400 mg of Advil).     No current facility-administered medications on file prior to visit.    Review of Systems  Constitutional:  Positive for activity change, appetite change and fatigue. Negative for diaphoresis, fever and unexpected weight change.  HENT:  Negative for congestion, ear pain, rhinorrhea, sinus pressure and sore throat.   Eyes:  Negative for pain, redness and visual disturbance.  Respiratory:  Negative for cough, shortness of breath and wheezing.   Cardiovascular:  Positive for leg swelling. Negative for chest pain and palpitations.  Gastrointestinal:  Positive for diarrhea and nausea. Negative for abdominal pain, blood in stool, constipation and vomiting.       Ascites   Endocrine: Negative for polydipsia and polyuria.  Genitourinary:  Negative for dysuria, frequency and urgency.  Musculoskeletal:  Positive for back pain. Negative for arthralgias and myalgias.       Back pain -not as bad as when hospitalized   Skin:  Negative for pallor and rash.  Allergic/Immunologic: Negative for environmental allergies.  Neurological:  Negative for dizziness, syncope and headaches.  Hematological:  Negative for adenopathy. Does not bruise/bleed easily.  Psychiatric/Behavioral:  Negative for decreased concentration and dysphoric mood. The patient is not nervous/anxious.        Objective:   Physical Exam Constitutional:      General: She is not in acute distress.    Appearance: She is  well-developed. She is not diaphoretic.     Comments: Frail appearing    HENT:     Head: Normocephalic and atraumatic.     Mouth/Throat:     Mouth: Mucous membranes are moist.     Pharynx: Oropharynx is clear.  Eyes:     General: No scleral icterus.       Right eye: No discharge.        Left eye: No discharge.     Conjunctiva/sclera: Conjunctivae normal.     Pupils: Pupils are equal, round, and reactive to light.  Neck:     Thyroid : No thyromegaly.     Vascular: No carotid bruit or JVD.  Cardiovascular:     Rate and Rhythm: Normal rate and regular rhythm.     Heart sounds: Normal heart sounds.     No gallop.  Pulmonary:     Effort: Pulmonary effort is normal. No respiratory distress.     Breath sounds: Normal breath sounds. No stridor. No wheezing, rhonchi or rales.  Abdominal:     General: There is no distension or abdominal bruit.     Palpations: Abdomen is soft.     Tenderness: There is no guarding or rebound.     Comments: Mild to moderate ascites No tenderness  Musculoskeletal:     Cervical back: Normal range of motion and neck supple.     Right lower leg: Edema present.     Left lower leg: Edema present.  Lymphadenopathy:     Cervical: No cervical adenopathy.  Skin:    General: Skin is warm and dry.     Coloration: Skin is not pale.     Findings: No bruising or rash.  Neurological:     Mental Status: She is alert.     Cranial Nerves: No cranial nerve deficit.     Coordination: Coordination normal.     Deep Tendon Reflexes: Reflexes are normal and symmetric. Reflexes normal.  Psychiatric:        Mood and Affect: Mood normal.  Assessment & Plan:   Problem List Items Addressed This Visit       Cardiovascular and Mediastinum   Hypotension - Primary   Worsened with bacteremia  Reviewed hospital records, lab results and studies in detail  Currently holding lasix  and spironolactone  due to low blood pressure Also taking minodrine 10 mg tid  BP:  (!) 89/55  Pt tolerating this low blood pressure  well Fatigue but not light headed Discussed need to change position slowly and gradually         Digestive   Pancreatic cancer [facility])   Oncology care at [facility] treatment following bacteremia  Holdiong on GCSF Seeing hematology for pancytopenia       Cirrhosis of liver (HCC)   With ascites and transaminitis  In setting of pancreatic cancer Under GI and onc and heme care  ? Liver mets recently  Reviewed hospital records, lab results and studies in detail   Had klebsiella bacteremia following paracentesis  Ascites present/not as severe today  Holding diuretics (lasix /aldactone ) due to low blood pressure         Hematopoietic and Hemostatic   Thrombocytopenia   In setting of pancytopenia  Due to malignancy Seeing onc Also hematology-had platelet boosting injection  Plt 83 on 10/29 at [facility]      Pancytopenia [facility])   Continues hematology care  Pancytopenia due to malignancy Did have platelet boosting injection  Also taking B12          Other   Nausea   Suspect due to cipro  for klebsiella bacteremia  Reviewed hospital records, lab results and studies in detail   Has to stay on this per ID consult  Encouraged to eat with it if possible Watching for other side effects Cancer treatment is currently on hold   Has zofran  for prn use /encouraged to use it  Also has compazine  on med list if needed   Continue to monitor         Hyponatremia   Currently holding diuretics for low blood pressure  Na of 132 on 10/27 131 on 10/29 at [facility]       Bilateral lower extremity edema   Holding lasix  and spironolactone  (for this with ascites) for low blood pressure following sepsis  Reviewed hospital records, lab results and studies in detail        Bacteremia   Klebsiella bacteremia following paracentesis  Reviewed hospital records, lab results and studies in detail  Took cefepime Now oral cipro  (since Monday)  for 14 days  Currently nauseated- possibly from antibiotic Taking zofran  Encouraged strongly to take with food as tolerated   Planning GI follow up soon       Ascites   Currently holding her lasix  and spironolactone  due to low blood pressure  Tolerating ascites  Last paracentesis followed by bacteremia   Planning GI follow up soon

## 2024-03-23 NOTE — Patient Instructions (Addendum)
 Try and take the cipro  with a little food (at least)   If nausea worsens - let GI doctor know   Call and get your appointment with GI   Continue the zofran  for nausea   Continue to hold the spironolactone  and the lasix  for now -until your blood pressure comes up   When you sit, elevate feet  When not sitting- slow walking is ok if not light headed   Watch your blood pressure   Let us  know when your GI appointment is and we will make a follow up plan

## 2024-03-24 ENCOUNTER — Encounter: Payer: Self-pay | Admitting: Family Medicine

## 2024-03-24 DIAGNOSIS — K7460 Unspecified cirrhosis of liver: Secondary | ICD-10-CM | POA: Insufficient documentation

## 2024-03-24 DIAGNOSIS — R7881 Bacteremia: Secondary | ICD-10-CM | POA: Insufficient documentation

## 2024-03-24 DIAGNOSIS — R110 Nausea: Secondary | ICD-10-CM | POA: Insufficient documentation

## 2024-03-24 NOTE — Assessment & Plan Note (Addendum)
 Worsened with bacteremia  Reviewed hospital records, lab results and studies in detail  Currently holding lasix  and spironolactone  due to low blood pressure Also taking minodrine 10 mg tid  BP: (!) 89/55  Pt tolerating this low blood pressure  well Fatigue but not light headed Discussed need to change position slowly and gradually

## 2024-03-24 NOTE — Assessment & Plan Note (Signed)
 Currently holding her lasix  and spironolactone  due to low blood pressure  Tolerating ascites  Last paracentesis followed by bacteremia   Planning GI follow up soon

## 2024-03-24 NOTE — Assessment & Plan Note (Signed)
 Currently holding diuretics for low blood pressure  Na of 132 on 10/27 131 on 10/29 at [facility]

## 2024-03-24 NOTE — Assessment & Plan Note (Signed)
 Continues hematology care  Pancytopenia due to malignancy Did have platelet boosting injection  Also taking B12

## 2024-03-24 NOTE — Assessment & Plan Note (Signed)
 Oncology care at [facility] treatment following bacteremia  Holdiong on GCSF Seeing hematology for pancytopenia

## 2024-03-24 NOTE — Assessment & Plan Note (Signed)
 With ascites and transaminitis  In setting of pancreatic cancer Under GI and onc and heme care  ? Liver mets recently  Reviewed hospital records, lab results and studies in detail   Had klebsiella bacteremia following paracentesis  Ascites present/not as severe today  Holding diuretics (lasix /aldactone ) due to low blood pressure

## 2024-03-24 NOTE — Assessment & Plan Note (Signed)
 Klebsiella bacteremia following paracentesis  Reviewed hospital records, lab results and studies in detail  Took cefepime Now oral cipro  (since Monday) for 14 days  Currently nauseated- possibly from antibiotic Taking zofran  Encouraged strongly to take with food as tolerated   Planning GI follow up soon

## 2024-03-24 NOTE — Assessment & Plan Note (Addendum)
 Suspect due to cipro  for klebsiella bacteremia  Reviewed hospital records, lab results and studies in detail   Has to stay on this per ID consult  Encouraged to eat with it if possible Watching for other side effects Cancer treatment is currently on hold   Has zofran  for prn use /encouraged to use it  Also has compazine  on med list if needed   Continue to monitor

## 2024-03-24 NOTE — Assessment & Plan Note (Signed)
 Holding lasix  and spironolactone  (for this with ascites) for low blood pressure following sepsis  Reviewed hospital records, lab results and studies in detail

## 2024-03-24 NOTE — Assessment & Plan Note (Signed)
 In setting of pancytopenia  Due to malignancy Seeing onc Also hematology-had platelet boosting injection  Plt 83 on 10/29 at [facility]

## 2024-03-26 DIAGNOSIS — R188 Other ascites: Secondary | ICD-10-CM | POA: Diagnosis not present

## 2024-03-26 DIAGNOSIS — E43 Unspecified severe protein-calorie malnutrition: Secondary | ICD-10-CM | POA: Diagnosis not present

## 2024-03-26 DIAGNOSIS — C259 Malignant neoplasm of pancreas, unspecified: Secondary | ICD-10-CM | POA: Diagnosis not present

## 2024-03-26 DIAGNOSIS — K766 Portal hypertension: Secondary | ICD-10-CM | POA: Diagnosis not present

## 2024-03-28 ENCOUNTER — Other Ambulatory Visit (HOSPITAL_COMMUNITY): Payer: Self-pay | Admitting: Gastroenterology

## 2024-03-28 DIAGNOSIS — D696 Thrombocytopenia, unspecified: Secondary | ICD-10-CM | POA: Diagnosis not present

## 2024-03-28 DIAGNOSIS — R188 Other ascites: Secondary | ICD-10-CM

## 2024-03-28 DIAGNOSIS — C250 Malignant neoplasm of head of pancreas: Secondary | ICD-10-CM | POA: Diagnosis not present

## 2024-03-29 ENCOUNTER — Ambulatory Visit (HOSPITAL_COMMUNITY)
Admission: RE | Admit: 2024-03-29 | Discharge: 2024-03-29 | Disposition: A | Discharge status: Home / Self Care | Source: Ambulatory Visit | Attending: Gastroenterology | Admitting: Gastroenterology

## 2024-03-29 DIAGNOSIS — R188 Other ascites: Secondary | ICD-10-CM | POA: Diagnosis not present

## 2024-03-29 HISTORY — PX: IR PARACENTESIS: IMG2679

## 2024-03-29 MED ORDER — HEPARIN SOD (PORK) LOCK FLUSH 100 UNIT/ML IV SOLN
500.0000 [IU] | Freq: Once | INTRAVENOUS | Status: AC
  Administered 2024-03-29: 500 [IU] via INTRAVENOUS

## 2024-03-29 MED ORDER — ALBUMIN HUMAN 25 % IV SOLN
INTRAVENOUS | Status: AC
  Filled 2024-03-29: qty 100

## 2024-03-29 MED ORDER — HEPARIN SOD (PORK) LOCK FLUSH 100 UNIT/ML IV SOLN
INTRAVENOUS | Status: AC
  Filled 2024-03-29: qty 5

## 2024-03-29 MED ORDER — ALBUMIN HUMAN 25 % IV SOLN
25.0000 g | Freq: Once | INTRAVENOUS | Status: AC
  Administered 2024-03-29: 25 g via INTRAVENOUS

## 2024-03-29 MED ORDER — LIDOCAINE-EPINEPHRINE (PF) 1 %-1:200000 IJ SOLN
10.0000 mL | Freq: Once | INTRAMUSCULAR | Status: AC
  Administered 2024-03-29: 10 mL

## 2024-03-29 MED ORDER — LIDOCAINE-EPINEPHRINE 1 %-1:100000 IJ SOLN
INTRAMUSCULAR | Status: AC
  Filled 2024-03-29: qty 1

## 2024-03-29 NOTE — Procedures (Signed)
 PROCEDURE SUMMARY:  Successful US  guided paracentesis from left lower quadrant.  Yielded 4 L of clear yellow fluid.  No immediate complications.  Pt tolerated well.   Specimen not sent for labs.  EBL < 2 mL  Warren JONELLE Dais, NP 03/29/2024 9:41 AM

## 2024-03-30 DIAGNOSIS — C250 Malignant neoplasm of head of pancreas: Secondary | ICD-10-CM | POA: Diagnosis not present

## 2024-03-30 DIAGNOSIS — D696 Thrombocytopenia, unspecified: Secondary | ICD-10-CM | POA: Diagnosis not present

## 2024-03-30 DIAGNOSIS — D61818 Other pancytopenia: Secondary | ICD-10-CM | POA: Diagnosis not present

## 2024-03-30 DIAGNOSIS — I81 Portal vein thrombosis: Secondary | ICD-10-CM | POA: Diagnosis not present

## 2024-04-02 NOTE — Progress Notes (Signed)
 PATIENT IDENTIFICATION  Nicole Johnson is a 63 y.o.  female from [place], [place] with metastatic pancreatic cancer.  ASSESSMENT & PLAN  Metastatic Pancreatic Cancer: Initially treated for LAPC with FOLFOX with addition of GCSF, but c/b TCP so FOLFOX stopped and now s/p chemoradiation completed 05/17/23 with metastatic recurrence 12/2023. Started gemcitabine/nab-pacli was on hold due to bacteremia. Now off abx.   PLAN: - Resume gemcitabine and add reduced dose nab-paclitaxel (50%). - Will hold off on her (home) administration of GCSF - Hematologic support as below  Perforated Cholecystitis: Resolved. S/p interval percutaneous cholecystostomy tube. Liver fluid collection decreased in size.   Pancytopenia with anemia, thrombocytopenia, and neutropenia secondary to malignancy: Improved plts. Still anemic at Hgb 8.7 s/p iron 02/22/24 - Administer platelet boosting injection at hematologist's office. - Start oral vitamin B12 supplementation.  EPI: Was on Creon, but feels better off of it. No EPI symptoms worsened as a result of coming off of it.  H/o Bacteremia: Klebsiella s/p cipro .   Ascites: Gets periodic paras locally. Diuretics on hold due to lower BP.  PHYSICIANS  PCP: Tower, Laine Caldron, MD Gastroenterology: Dr. Rollin [facility]) Medical oncologist: Dr. Dr. Onita Mattock Surgical oncologist: Dr. Maude Dawn  ONCOLOGY HISTORY    Cancer Staging <redacted file path>  Malignant neoplasm of head of pancreas (CMS/HHS-HCC) Staging form: Exocrine Pancreas, AJCC 8th Edition - Clinical: cT2 - Unsigned  12/16/22 MRI showing infiltrative mass of pancreatic had causing biliary obstruction. Galllbladder significantly distended and moderate splenomegaly seen. Pancreas tumor involves celiac artery bifurcation, SMA, and narrows SMV. Splenic vein also narrowed and PVT with possible thrombus.  12/17/22 CA 19-9 153 in the setting of biliary obstruction with bilirubin 11/7  12/17/22 ERCP. CBD dilated to 10 mm, lower  third bile duct brushings obtained. Plastic stent placed. Brushings were nehative without malignant cells.  12/18/22 EUS 32 x 38 mm mass in pancreatic head. Biopsy was negative without malignant cells.  12/30/22 EUS diagnostic of adenocarcinoma.  01/11/23 CT CAP ([facility]) with 4.5 x 2.9 cm pancreatic neck mass encasing the celiac trunk and proximal SMA, Mass encases SMV confluence, splenic vein, and main portal vein which has moderate to high grade narrowing. There is a 6 mm indeterminate segment 4a liver lesion not well seen on July MRI and indeterminate lung nodules up to 4 mm in size. Again splenomgaly seen as well as trace ascites.  Port placement 02/11/23 Cycle 1 FOLFOX - 80% oxaliplatin  02/25/23  Add GCSF 03/11/23 Hold FOLFOX due to platelets 65 03/18/23 Hold FOLFOX due to platelets 74 03/25/23 No chemo 04/02/23 Cholecystitis, treated with antibiotics 04/13/23 - 05/17/23 Chemoradiation (capecitabine) 06/02/23 CT C/A/P New findings of perforated cholecystitis with a dilated and thickened gallbladder with irregular wall and adjacent intrahepatic collections. Biliary stent may be occluding the cystic duct. Correlate with stent function. Similar pancreatic head mass with extensive vascular involvement as discussed above. New areas of wedge-shaped hypoattenuation in the right kidney, concerning for renal infarcts versus pyelonephritis. Recommend correlation with urinalysis. Ascites with minimal peritoneal enhancement and thickening most notable in the pelvis which may suggest peritonitis or early carcinomatosis. Attention on follow up is recommended. Other findings related to portal hypertension including splenomegaly and esophageal/gastric varices. 06/02/23 IR Cholecystostomy 06/03/23 U/s LE bilateral no DVT 07/06/23 CT A/P Interval placement of percutaneous cholecystostomy tube with decreased size of gallbladder. Interval decrease in biliary dilation status post wallstent. Previously seen hepatic cystic lesions in  segment 5 adjacent to the gallbladder fossa is decreased in size. Additional cystic  lesion in segment 5 4B superior to the gallbladder fossa with enhancing rim is similar in size and may represent bile lake or fluid collection. Superimposed infection is not excluded. Pancreatic head mass is similar to decreased in size compared to prior study though difficult to measure. Continued encasement of the celiac axis, hepatic artery, and superior mesenteric arteries as well as encasement of the portal splenic confluence and narrowing and distortion of the superior mesenteric vein. Increased ascites associated with peritoneal thickening and nodularity in the pelvis. 07/06/23 CT chest PE protocol. No PE.  08/12/23 CT C/A/P Colitis. Interval percutaneous cholecystostomy tube removal with tethering of the fundus to the right anterior chest wall/abdominal wall musculature. Similar right hepatic lobe cystic collection, suspicious for an infectious etiology. Similar ill-defined pancreatic head mass with vascular involvement as above. Findings concerning for worsening peritoneal carcinomatosis. 09/28/23 CT C/A/P Similar cystic lesion in the liver, favored biloma or abscess. Similar locally advanced pancreas cancer. Similar sequela of portal hypertension. Diffuse colonic thickening may relate in part to portal colopathy versus colitis. Unchanged dependent peritoneal nodule, suspicious for carcinomatosis. 11/23/23 CT C/A/P Persistent mild intrahepatic biliary ductal dilatation with note that the ducts are now entirely fluid-filled. There is new associated debris in the common bile duct stent. Correlate with LFTs for signs of stent dysfunction. Decreased size of a thick-walled hypoattenuating lesion in the liver, which may reflect resolving biloma or abscess. No appreciable interval change in locally advanced pancreatic cancer. Stable pelvic peritoneal nodule suspicious for carcinomatosis. 01/05/24 CT A/P Cirrhotic liver morphology  with portal hypertension including worsening ascites.Marked gastric wall and colonic thickening, favored to reflect worsening portal gastropathy and colopathy, though gastritis and colitis could have similar appearances. Unchanged appearance of locally advanced pancreatic head lesion. The biliary stent is patent and there has been interval decreased size of rim-enhancing lesion within the liver, favored resolving biloma/abscess. 02/22/24 Gemcitabine with TPO support and GCSF support 03/07/24 Added nab-paclitaxel 03/15/24 Klebsiella bacteremia 03/21/24 Hold chemo 03/29/24 Paracentesis 4L 04/04/24 Gemcitabine alone  The patient's disease status is up-to-date as described in the oncology history above.  MOLECULAR DIAGNOSTICS AND MARKERS  Microsatelite unknown Foundation Medicine: N/A Guardant 360: N/A  INTERVAL HISTORY   History of Present Illness ZAKARA PARKEY is a 63 year old female with metastatic pancreatic cancer who presents for chemotherapy management.  She recently discontinued antibiotics prescribed for a blood infection due to severe nausea and leg swelling, stopping them two days early because of significant discomfort and inability to eat.  She continues to experience low energy levels and persistent nausea, although there is some improvement. She is currently using medication to manage her nausea.  She has been started on Eliquis. Her anemia has shown improvement, with hemoglobin levels rising to 8.7 g/dL from a previous lower value.  Last para 4L last week on Thurs.   Her CA 19-9 levels have decreased to 197.  Using protein shakes.    REVIEW OF SYSTEMS  12 point review of systems reviewed and pertinent positives and negatives are as noted in the Interval History.  HISTORY AND MEDICATIONS   Past Medical History:  Diagnosis Date  . Colitis with complication   . Known health problems: none   . Pancreatic cancer (CMS/HHS-HCC)   . PONV (postoperative nausea and vomiting)      Past Surgical History:  Procedure Laterality Date  . ENDOSCOPIC RETROGRADE CHOLANGIO-PANCREATOGRAPHY W/BILIARY TUBE/STENT N/A 03/30/2023   Procedure: ERCP - Endoscopic Retrograde Cholangiopancreatography;  Surgeon: Burbridge, Asberry Caldron, MD;  Location: [facility];  Service: Gastroenterology;  Laterality: N/A;  . ENDOSCOPY OF BILIARY DUCT  03/30/2023   Procedure: ENDOSCOPIC CATHETERIZATION OF THE BILIARY DUCTAL SYSTEM, RADIOLOGICAL SUPERVISION AND INTERPRETATION;  Surgeon: Burbridge, Asberry Caldron, MD;  Location: [facility];  Service: Gastroenterology;;  . ENDOSCOPIC RETROGRADE CHOLANGIO-PANCREATOGRAPHY W/EXTRACTION STONE N/A 03/30/2023   Procedure: ENDOSCOPIC RETROGRADE CHOLANGIOPANCREATOGRAPHY (ERCP); WITH REMOVAL OF CALCULI/DEBRIS FROM BILIARY/PANCREATIC DUCT(S);  Surgeon: Burbridge, Asberry Caldron, MD;  Location: [facility];  Service: Gastroenterology;  Laterality: N/A;  . ENDOSCOPIC RETROGRADE CHOLANGIO-PANCREATOGRAPHY W/BILIARY TUBE/STENT N/A 06/06/2023   Procedure: ERCP; WITH REMOVAL AND EXCHANGE OF STENT, BILIARY / PANCREATIC DUCT, INCLUDING PRE- AND POST-DILATION AND GUIDE WIRE PASSAGE,  INCLUDING SPHINCTEROTOMY, EACH STENT EXCHANGE;  Surgeon: Jama Fonda Ruse, MD;  Location: [facility];  Service: Gastroenterology;  Laterality: N/A;  . ENDOSCOPY OF BILIARY DUCT  06/06/2023   Procedure: ENDOSCOPIC CATHETERIZATION OF THE BILIARY DUCTAL SYSTEM, RADIOLOGICAL SUPERVISION AND INTERPRETATION;  Surgeon: Jama Fonda Ruse, MD;  Location: [facility];  Service: Gastroenterology;;  . ENDOSCOPIC RETROGRADE CHOLANGIO-PANCREATOGRAPHY W/EXTRACTION STONE N/A 06/06/2023   Procedure: ENDOSCOPIC RETROGRADE CHOLANGIOPANCREATOGRAPHY (ERCP); WITH REMOVAL OF CALCULI/DEBRIS FROM BILIARY/PANCREATIC DUCT(S);  Surgeon: Jama Fonda Ruse, MD;  Location: [facility];  Service: Gastroenterology;  Laterality: N/A;  . ENDOSCOPIC RETROGRADE  CHOLANGIO-PANCREATOGRAPHY W/BILIARY TUBE/STENT N/A 11/30/2023   Procedure: ERCP; WITH REMOVAL AND EXCHANGE OF STENT, BILIARY / PANCREATIC DUCT, INCLUDING PRE- AND POST-DILATION AND GUIDE WIRE PASSAGE,  INCLUDING SPHINCTEROTOMY, EACH STENT EXCHANGE;  Surgeon: Alvan Gwendlyn Bars, MD;  Location: [facility];  Service: Gastroenterology;  Laterality: N/A;  . ENDOSCOPY OF BILIARY DUCT  11/30/2023   Procedure: ENDOSCOPIC CATHETERIZATION OF THE BILIARY DUCTAL SYSTEM, RADIOLOGICAL SUPERVISION AND INTERPRETATION;  Surgeon: Alvan Gwendlyn Bars, MD;  Location: [facility];  Service: Gastroenterology;;  . ENDOSCOPIC RETROGRADE CHOLANGIO-PANCREATOGRAPHY W/EXTRACTION STONE N/A 11/30/2023   Procedure: ENDOSCOPIC RETROGRADE CHOLANGIOPANCREATOGRAPHY (ERCP); WITH REMOVAL OF CALCULI/DEBRIS FROM BILIARY/PANCREATIC DUCT(S);  Surgeon: Alvan Gwendlyn Bars, MD;  Location: [facility];  Service: Gastroenterology;  Laterality: N/A;  . tubular pregnancy      Family History  Problem Relation Age of Onset  . PONV Sister   . PONV Brother     Social History   Socioeconomic History  . Marital status: Married  Tobacco Use  . Smoking status: Never  . Smokeless tobacco: Never  Vaping Use  . Vaping status: Former  Substance and Sexual Activity  . Alcohol  use: Not Currently    Alcohol /week: 2.0 standard drinks of alcohol     Types: 2 Shots of liquor per week  . Drug use: Never    Comment: Hemp oil   Social Drivers of Corporate Investment Banker Strain: Low Risk  (06/08/2023)   Overall Financial Resource Strain (CARDIA)   . Difficulty of Paying Living Expenses: Not hard at all  Food Insecurity: No Food Insecurity (03/15/2024)   [facility]   Hunger Vital Sign   . Within the past 12 months, you worried that your food would run out before you got the money to buy more.: Never true   . Within the past 12 months, the food you bought just didn't last and you didn't  have money to get more.: Never true  Transportation Needs: No Transportation Needs (03/15/2024)   Received from [facility] - Transportation   . In the past 12 months, has lack of transportation kept you from medical appointments or from getting medications?: No   . In the past 12 months,  has lack of transportation kept you from meetings, work, or from getting things needed for daily living?: No  Social Connections: Moderately Isolated (06/08/2023)   Received from [facility]   Social Connection and Isolation Panel   . In a typical week, how many times do you talk on the phone with family, friends, or neighbors?: More than three times a week   . How often do you get together with friends or relatives?: More than three times a week   . How often do you attend church or religious services?: Never   . Do you belong to any clubs or organizations such as church groups, unions, fraternal or athletic groups, or school groups?: No   . How often do you attend meetings of the clubs or organizations you belong to?: Never   . Are you married, widowed, divorced, separated, never married, or living with a partner?: Married  Housing Stability: Unknown (06/08/2023)   [facility] Stability Vital Sign   . Unable to Pay for Housing in the Last Year: No   . Homeless in the Last Year: No    Allergies  Allergen Reactions  . Other Nausea and Other (See Comments)    Certain types of Anesthesia cause SEVERE NAUSEA  . Codeine Nausea  . Meloxicam Nausea  . Tramadol Nausea  . Vicodin [Hydrocodone -Acetaminophen ] Hallucination    Outpatient Encounter Medications as of 04/04/2024  Medication Sig Dispense Refill  . apixaban (ELIQUIS) 2.5 mg tablet Take 1 tablet (2.5 mg total) by mouth every 12 (twelve) hours for 14 days    . cetirizine HCl (ZYRTEC ORAL) Take by mouth once daily    . CREON 24,000-76,000 -120,000 unit DR capsule Take 3 capsules by mouth before each meal, and take 1  capsule before snacks. 330 capsule 11  . cyanocobalamin , vitamin B-12, (VITAMIN B-12 ORAL) Take 1 tablet by mouth once daily    . dexAMETHasone  (DECADRON ) 4 MG tablet Take 8mg  (2 x 4mg  tablets) by mouth in the morning for 2 days after the first day of each cycle, then as directed. 30 tablet 2  . midodrine (PROAMATINE) 10 MG tablet Take 10 mg by mouth 3 (three) times daily    . OLANZapine (ZYPREXA) 5 MG tablet Take 0.5 tablets (2.5 mg total) by mouth at bedtime    . omeprazole  (PRILOSEC) 20 MG DR capsule Take 20 mg by mouth 2 (two) times daily before meals    . ondansetron  (ZOFRAN ) 8 MG tablet Take 1 tablet (8 mg total) by mouth every 8 (eight) hours as needed for Nausea or Vomiting 30 tablet 2  . ondansetron  (ZOFRAN ) 8 MG tablet Take 1 tablet (8 mg total) by mouth every 12 (twelve) hours as needed for Nausea or Vomiting 60 tablet 2  . pegfilgrastim-jmdb (FULPHILA) 6 mg/0.6 mL injection Inject 0.6 mLs (6 mg total) subcutaneously every 14 (fourteen) days On the day after chemo 1.2 mL 5  . prochlorperazine  (COMPAZINE ) 10 MG tablet Take 1 tablet (10 mg total) by mouth every 6 (six) hours as needed for Nausea 60 tablet 2  . spironolactone  (ALDACTONE ) 100 MG tablet Take 50 mg by mouth once daily    . [EXPIRED] ciprofloxacin  HCl (CIPRO ) 500 MG tablet Take 500 mg by mouth 2 (two) times daily     No facility-administered encounter medications on file as of 04/04/2024.    PHYSICAL EXAMINATION   BP 92/63 (BP Location: Right upper arm, Patient Position: Sitting, BP Cuff Size: Adult)  Pulse 65   Temp 36.3 C (97.3 F) (Oral)   Resp 21   Wt 67.1 kg (147 lb 14.9 oz)   SpO2 100%   BMI 24.65 kg/m  Wt Readings from Last 3 Encounters:  04/04/24 67.1 kg (147 lb 14.9 oz)  04/04/24 67.1 kg (147 lb 14.9 oz)  03/30/24 66.3 kg (146 lb 2.6 oz)    ECOG: 1 Strenuous physical activity restricted; fully ambulatory and able to carry out light work  Constitutional: NAD, thin, comfortable HEENT: NCAT. EOM full.  PER. Cardiovascular: RRR no MRG Respiratory: CTAB Abdomen: Soft, distended, quite firm.  Extremities: no lower extremity edema Skin: no icterus, normal turgor, no suspicious skin lesions.  Neurological: AAO x 4, normal speech, grossly normal motor and sensory exam, normal muscle tone, no tremors  LABS AND STUDIES   Results for orders placed or performed in visit on 04/04/24  Complete Blood Count (CBC) with Differential  Result Value Ref Range   WBC (White Blood Cell Count) 3.8 3.2 - 9.8 x10^9/L   Hemoglobin 8.7 (L) 11.7 - 15.5 g/dL   Hematocrit 72.5 (L) 64.9 - 45.0 %   Platelets 187 150 - 450 x10^9/L   MCV (Mean Corpuscular Volume) 88 80 - 98 fL   MCH (Mean Corpuscular Hemoglobin) 28.1 26.5 - 34.0 pg   MCHC (Mean Corpuscular Hemoglobin Concentration) 31.8 31.0 - 36.0 %   RBC (Red Blood Cell Count) 3.10 (L) 3.77 - 5.16 x10^12/L   RDW-CV (Red Cell Distribution Width) 20.3 (H) 11.5 - 14.5 %   NRBC (Nucleated Red Blood Cell Count) 0.00 0 x10^9/L   NRBC % (Nucleated Red Blood Cell %) 0.0 %   MPV (Mean Platelet Volume) 11.6 7.2 - 11.7 fL   Neutrophil Count 3.1 2.0 - 8.6 x10^9/L   Neutrophil % 82.0 (H) 37 - 80 %   Lymphocyte Count 0.3 (L) 0.6 - 4.2 x10^9/L   Lymphocyte % 6.6 (L) 10 - 50 %   Monocyte Count 0.3 0 - 0.9 x10^9/L   Monocyte % 9.0 0 - 12 %   Eosinophil Count 0.06 0 - 0.70 x10^9/L   Eosinophil % 1.6 0 - 7 %   Basophil Count 0.02 0 - 0.20 x10^9/L   Basophil % 0.5 0 - 2 %   Immature Granulocyte Count 0.01 <=0.06 x10^9/L   Immature Granulocyte % 0.3 <=0.7 %  Comprehensive Metabolic Panel (CMP)  Result Value Ref Range   Sodium 130 (L) 135 - 145 mmol/L   Potassium 4.1 3.5 - 5.0 mmol/L   Chloride 101 98 - 108 mmol/L   Carbon Dioxide (CO2) 24 21 - 30 mmol/L   Urea Nitrogen (BUN) 25 (H) 7 - 20 mg/dL   Creatinine 1.2 (H) 0.4 - 1.0 mg/dL   Glucose 837 (H) 70 - 140 mg/dL   Calcium 7.9 (L) 8.7 - 10.2 mg/dL   AST (Aspartate Aminotransferase) 28 15 - 41 U/L   ALT (Alanine  Aminotransferase) 26 10 - 39 U/L   Bilirubin, Total 0.4 0.4 - 1.5 mg/dL   Alk Phos (Alkaline Phosphatase) 194 (H) 24 - 110 U/L   Albumin 2.3 (L) 3.5 - 4.8 g/dL   Protein, Total 5.6 (L) 6.2 - 8.1 g/dL   Anion Gap 5 3 - 12 mmol/L   BUN/CREA Ratio 21 6 - 27   Glomerular Filtration Rate (eGFR)  51 mL/min/1.73sq m  Cancer Antigen 19-9 (CA 19-9)  Result Value Ref Range   Cancer Antigen 19-9 (CA 19-9) 197 (H) <=35 U/mL   Narrative  The manufacturer and instrument used is the [employer] DxI 800 Access Immunoassay System. The Access GI Monitor (CA 19-9 Ag) assay is a two-site immunoenzymatic ("sandwich") assay.  Values determined on patient samples by differing platforms and methodologies cannot be directly compared with one another, and could be cause of erroneous medical interpretation.    Results for orders placed during the hospital encounter of 03/07/24  CT DUAL PANCREAS INCL CT ABD PEL W AND CT CHEST W MIPS  Narrative Procedure: CT Chest with IV Contrast Procedure: CT Abdomen and Pelvis with IV Contrast  Comparison:  CT chest abdomen pelvis 02/21/2024.  Indication:  Pancreatic cancer, monitor, pancreas cancer, C25.0 Malignant neoplasm of head of pancreas (CMS/HHS-HCC).  Technique:  CT imaging of the chest, abdomen, and pelvis was performed with IV contrast. A pancreatic protocol CT was performed, with pancreatic arterial phase imaging of the abdomen and portal venous phase imaging of the chest, abdomen, and pelvis.   Iodinated contrast was used due to the indications for the examination, to improve disease detection and to further define anatomy. Coronal and sagittal reformatted images were generated and reviewed. 3-D maximal intensity projection (MIP) reconstructions of the chest were created and reviewed to potentially increase study sensitivity.  Findings: Chest: - Chest wall and Thoracic Inlet: Right chest wall port terminates in the right atrium. Unremarkable  thyroid  and visualized neck base.  - Mediastinum and Hila: No masses or lymphadenopathy. Nondistended esophagus.  - Thoracic Vessels: Normal caliber of the thoracic aorta and main pulmonary artery.  - Heart and Pericardium: Normal heart size.  No pericardial effusion. No significant coronary atherosclerotic calcifications.  - Lungs and Airways: Persistent groundglass opacity in the left lower lobe. No suspicious new or increasing suspicious pulmonary nodules/opacities. Scattered punctate pulmonary nodules are unchanged with reference to millimeter right middle lobe nodule (11, 240). Central airways are patent.  - Pleura: No pleural effusions.   Abdomen and pelvis:  - Liver: Similar size of hypoattenuating lesion in the central liver measuring 2.3 x 1.9 cm (10, 87), previously 2.3 x 2 cm. No new suspicious hepatic lesions. There is persistent low attenuation soft tissue versus fluid surrounding the central portal veins. Redemonstrated is severe narrowing of the extrahepatic portal vein and SMV as it traverses this area of soft tissue.  - Biliary and Gallbladder: No significant intrahepatic or extrahepatic biliary ductal dilatation. Nonvisualized gallbladder. Persistent predominantly left hepatic pneumobilia with unchanged position of covered biliary stent.  - Spleen: Splenomegaly. Dense glandular compression of the splenic vein with severe narrowing] confluence.  - Pancreas: Similar appearance of infiltrative and confluent soft tissue mass arising off the pancreatic neck with encasement of the celiac artery, common hepatic artery, and SMA.  - Adrenal Glands: Normal in appearance.  - Kidneys: Kidneys are symmetric in size and enhancement. No suspicious renal lesions. No hydronephrosis.  - Abdominal and Pelvic Vasculature: No abdominal aortic aneurysm. No significant atherosclerotic plaque. Duplicated IVC. Upper abdominal varices.  - Gastrointestinal Tract: No abnormal  bowel dilatation. Mildly thick walled and decompressed cecum, presumed secondary to portal entero colopathy.  - Peritoneum/Mesentery/Retroperitoneum: Increased now large volume ascites. No free intraperitoneal air.  Previously described peritoneal nodule in the right lower quadrant is difficult to appreciate on today's exam.  - Lymph Nodes: No retroperitoneal or mesenteric lymphadenopathy.  - Bladder: Normal in appearance.  - Pelvic Organs: Prominent parametrial vessels again seen..  - Body Wall: Mild anasarca.  - Musculoskeletal:  No aggressive appearing osseous lesions.  Impression: 1.  Overall similar  appearance of locally invasive pancreatic head/neck mass with vascular involvement described above. 2.  Indeterminate central right hepatic lesion is unchanged in size. 3.  Previously seen right peritoneal nodule is not definitively visualized on this exam. Increasing large volume ascites is nonspecific and may be secondary to worsening portal hypertension versus peritoneal disease.  Electronically Reviewed by:  Elston Mcbride, MD, [facility] Electronically Reviewed on:  03/07/2024 2:54 PM  I have reviewed the images and concur with the above findings.  Electronically Signed by:  Morene Fishman, MD, [facility] Electronically Signed on:  03/07/2024 3:33 PM    All studies completed in the past week were reviewed personally in the clinic.  CARE TEAM  Patient Care Team: Tower, Laine Caldron, MD as PCP - General (Family Medicine) Lanny Callander, MD (Hematology and Oncology) Lenon Channing Campi, PA as Physician Assistant (Hematology and Oncology) Mettu, Buzzy Pitter, MD as Consulting Provider (Oncology)  FUTURE APPOINTMENTS   Future Appointments     Date/Time Provider Department Center Visit Type   04/04/2024 1:15 PM (Arrive by 12:45 PM) OTC [facility] Oncology Treatment [facility] ONCOLOGY [facility]HR   04/11/2024 9:00 AM (Arrive by 8:30 AM) LAB(PHLEBOTOMY)- [facility]   04/11/2024 10:00 AM (Arrive by 9:30 AM) Nurse, [facility] Hem [facility] NURSE VISIT   04/13/2024 11:00 AM (Arrive by 10:45 AM) PORT NURSE [facility] Lab Draw [facility] LAB   04/13/2024 12:30 PM (Arrive by 12:15 PM) Mettu, Buzzy Pitter, MD [facility] RETURN VISIT   04/13/2024 1:15 PM (Arrive by 12:45 PM) OTC [facility] Oncology Treatment [facility] ONCOLOGY [place] 3HR   04/17/2024 9:30 AM (Arrive by 9:00 AM) LAB(PHLEBOTOMY)- [facility]D [facility]   04/17/2024 10:30 AM (Arrive by 10:00 AM) Nurse, [facility] Hem [facility] NURSE VISIT   04/26/2024 11:00 AM (Arrive by 10:45 AM) Mardy Rexene Hun, PA [facility] NEW PATIENT   04/27/2024 10:40 AM (Arrive by 10:25 AM) PORT NURSE [facility] Lab [facility]   04/27/2024 12:15 PM (Arrive by 12:00 PM) Mettu, Buzzy Pitter, MD [facility] RETURN VISIT   04/27/2024 1:15 PM (Arrive by 12:45 PM) OTC [facility] ONCOLOGY TX 3HR   05/11/2024 9:00 AM (Arrive by 8:45 AM) PORT NURSE [facility]   05/11/2024 10:30 AM (Arrive by 10:15 AM) Mettu, Buzzy Pitter, MD [facility] RETURN VISIT   05/11/2024 11:15 AM (Arrive by 10:45 AM) OTC [facility] ONCOLOGY TX 3HR   07/06/2024 3:30 PM (Arrive by 3:00 PM) Rosy Prentice Sharper, DO [facility]       I spent a total of 40 minutes in both face-to-face and non-face-to-face activities for this visit on the date of this encounter. The patient understood the treatment plan and there were no barriers to understanding. All the patient's questions were answered to his or her satisfaction.  The patient knows to call with any questions or concerns in the  meantime.   Attestation Statement:   I personally performed the service, non-incident to. [facility])   Buzzy Pitter Curet, MD

## 2024-04-04 DIAGNOSIS — R188 Other ascites: Secondary | ICD-10-CM | POA: Diagnosis not present

## 2024-04-04 DIAGNOSIS — Z5111 Encounter for antineoplastic chemotherapy: Secondary | ICD-10-CM | POA: Diagnosis not present

## 2024-04-04 DIAGNOSIS — Z8619 Personal history of other infectious and parasitic diseases: Secondary | ICD-10-CM | POA: Diagnosis not present

## 2024-04-04 DIAGNOSIS — Z79899 Other long term (current) drug therapy: Secondary | ICD-10-CM | POA: Diagnosis not present

## 2024-04-04 DIAGNOSIS — Z923 Personal history of irradiation: Secondary | ICD-10-CM | POA: Diagnosis not present

## 2024-04-04 DIAGNOSIS — D709 Neutropenia, unspecified: Secondary | ICD-10-CM | POA: Diagnosis not present

## 2024-04-04 DIAGNOSIS — D649 Anemia, unspecified: Secondary | ICD-10-CM | POA: Diagnosis not present

## 2024-04-04 DIAGNOSIS — Z87891 Personal history of nicotine dependence: Secondary | ICD-10-CM | POA: Diagnosis not present

## 2024-04-04 DIAGNOSIS — K8681 Exocrine pancreatic insufficiency: Secondary | ICD-10-CM | POA: Diagnosis not present

## 2024-04-04 DIAGNOSIS — D61818 Other pancytopenia: Secondary | ICD-10-CM | POA: Diagnosis not present

## 2024-04-04 DIAGNOSIS — C250 Malignant neoplasm of head of pancreas: Secondary | ICD-10-CM | POA: Diagnosis not present

## 2024-04-04 NOTE — Progress Notes (Signed)
 Per Amelia Silversmith [facility], patient is financially cleared for treatment today.

## 2024-04-04 NOTE — Progress Notes (Addendum)
 C2 D1 of Gemcitabine  Nursing Assessment Neurological- alert and oriented x4 Cardiopulmonary- no issues Genitourinary- no issues Gastrointestinal- Patient reports increased nausea recently and constipation potentially r/t increased zofran  use.  Skin- no new rashes, wounds, or areas of concern Psychosocial- no issues Nutritional- no issues Fatigue level- 3/10 Oral mucositis- no issues Pain score- no issues Supportive Care-Nplate administered. IV Compazine  given for nausea.   See flowsheet for falls assessment, IV assessment, and vital signs. Access - Port PTA - Patient arrived accessed. Dressing and site clean, dry and intact. Blood return noted prior to and post infusion. Patient de-accessed per protocol  Patient has additional infusion therapy plan for Nplate Patient has no orders for later Patient has upcoming filgrastim shot, patient instructed to begin GCSF injection at home after 3:40pm tomorrow (24 Hrs).  Patient tolerated treatment well and was discharged from treatment room.

## 2024-04-11 ENCOUNTER — Inpatient Hospital Stay (HOSPITAL_COMMUNITY)
Admission: EM | Admit: 2024-04-11 | Discharge: 2024-04-27 | DRG: 870 | Disposition: A | Attending: Family Medicine | Admitting: Family Medicine

## 2024-04-11 ENCOUNTER — Emergency Department (HOSPITAL_COMMUNITY)

## 2024-04-11 ENCOUNTER — Encounter (HOSPITAL_COMMUNITY): Payer: Self-pay | Admitting: Radiology

## 2024-04-11 ENCOUNTER — Observation Stay (HOSPITAL_COMMUNITY)

## 2024-04-11 DIAGNOSIS — Z79899 Other long term (current) drug therapy: Secondary | ICD-10-CM

## 2024-04-11 DIAGNOSIS — K659 Peritonitis, unspecified: Secondary | ICD-10-CM | POA: Diagnosis present

## 2024-04-11 DIAGNOSIS — F419 Anxiety disorder, unspecified: Secondary | ICD-10-CM | POA: Diagnosis present

## 2024-04-11 DIAGNOSIS — R109 Unspecified abdominal pain: Secondary | ICD-10-CM

## 2024-04-11 DIAGNOSIS — D696 Thrombocytopenia, unspecified: Principal | ICD-10-CM

## 2024-04-11 DIAGNOSIS — I81 Portal vein thrombosis: Secondary | ICD-10-CM | POA: Diagnosis present

## 2024-04-11 DIAGNOSIS — B49 Unspecified mycosis: Secondary | ICD-10-CM | POA: Diagnosis present

## 2024-04-11 DIAGNOSIS — Z66 Do not resuscitate: Secondary | ICD-10-CM | POA: Diagnosis present

## 2024-04-11 DIAGNOSIS — L89626 Pressure-induced deep tissue damage of left heel: Secondary | ICD-10-CM | POA: Diagnosis present

## 2024-04-11 DIAGNOSIS — Z7189 Other specified counseling: Secondary | ICD-10-CM

## 2024-04-11 DIAGNOSIS — K7460 Unspecified cirrhosis of liver: Secondary | ICD-10-CM | POA: Diagnosis present

## 2024-04-11 DIAGNOSIS — R180 Malignant ascites: Secondary | ICD-10-CM | POA: Diagnosis not present

## 2024-04-11 DIAGNOSIS — E860 Dehydration: Secondary | ICD-10-CM | POA: Diagnosis present

## 2024-04-11 DIAGNOSIS — R54 Age-related physical debility: Secondary | ICD-10-CM | POA: Diagnosis present

## 2024-04-11 DIAGNOSIS — E878 Other disorders of electrolyte and fluid balance, not elsewhere classified: Secondary | ICD-10-CM | POA: Diagnosis present

## 2024-04-11 DIAGNOSIS — D709 Neutropenia, unspecified: Secondary | ICD-10-CM | POA: Diagnosis present

## 2024-04-11 DIAGNOSIS — R451 Restlessness and agitation: Secondary | ICD-10-CM | POA: Diagnosis not present

## 2024-04-11 DIAGNOSIS — L89616 Pressure-induced deep tissue damage of right heel: Secondary | ICD-10-CM | POA: Diagnosis present

## 2024-04-11 DIAGNOSIS — I471 Supraventricular tachycardia, unspecified: Secondary | ICD-10-CM | POA: Diagnosis not present

## 2024-04-11 DIAGNOSIS — R4589 Other symptoms and signs involving emotional state: Secondary | ICD-10-CM

## 2024-04-11 DIAGNOSIS — G893 Neoplasm related pain (acute) (chronic): Secondary | ICD-10-CM | POA: Diagnosis present

## 2024-04-11 DIAGNOSIS — N179 Acute kidney failure, unspecified: Secondary | ICD-10-CM

## 2024-04-11 DIAGNOSIS — R188 Other ascites: Secondary | ICD-10-CM | POA: Diagnosis not present

## 2024-04-11 DIAGNOSIS — D611 Drug-induced aplastic anemia: Secondary | ICD-10-CM

## 2024-04-11 DIAGNOSIS — Z8249 Family history of ischemic heart disease and other diseases of the circulatory system: Secondary | ICD-10-CM

## 2024-04-11 DIAGNOSIS — R570 Cardiogenic shock: Secondary | ICD-10-CM | POA: Diagnosis not present

## 2024-04-11 DIAGNOSIS — E871 Hypo-osmolality and hyponatremia: Secondary | ICD-10-CM | POA: Diagnosis present

## 2024-04-11 DIAGNOSIS — A499 Bacterial infection, unspecified: Secondary | ICD-10-CM | POA: Diagnosis present

## 2024-04-11 DIAGNOSIS — N170 Acute kidney failure with tubular necrosis: Secondary | ICD-10-CM | POA: Diagnosis not present

## 2024-04-11 DIAGNOSIS — B952 Enterococcus as the cause of diseases classified elsewhere: Secondary | ICD-10-CM | POA: Diagnosis present

## 2024-04-11 DIAGNOSIS — K529 Noninfective gastroenteritis and colitis, unspecified: Secondary | ICD-10-CM | POA: Diagnosis present

## 2024-04-11 DIAGNOSIS — B961 Klebsiella pneumoniae [K. pneumoniae] as the cause of diseases classified elsewhere: Secondary | ICD-10-CM | POA: Diagnosis not present

## 2024-04-11 DIAGNOSIS — E870 Hyperosmolality and hypernatremia: Secondary | ICD-10-CM | POA: Diagnosis not present

## 2024-04-11 DIAGNOSIS — R64 Cachexia: Secondary | ICD-10-CM | POA: Diagnosis present

## 2024-04-11 DIAGNOSIS — T451X5A Adverse effect of antineoplastic and immunosuppressive drugs, initial encounter: Secondary | ICD-10-CM | POA: Diagnosis present

## 2024-04-11 DIAGNOSIS — Z885 Allergy status to narcotic agent status: Secondary | ICD-10-CM

## 2024-04-11 DIAGNOSIS — C787 Secondary malignant neoplasm of liver and intrahepatic bile duct: Secondary | ICD-10-CM | POA: Diagnosis present

## 2024-04-11 DIAGNOSIS — Z888 Allergy status to other drugs, medicaments and biological substances status: Secondary | ICD-10-CM

## 2024-04-11 DIAGNOSIS — K8689 Other specified diseases of pancreas: Secondary | ICD-10-CM | POA: Diagnosis not present

## 2024-04-11 DIAGNOSIS — E8770 Fluid overload, unspecified: Secondary | ICD-10-CM | POA: Diagnosis not present

## 2024-04-11 DIAGNOSIS — L8990 Pressure ulcer of unspecified site, unspecified stage: Secondary | ICD-10-CM | POA: Insufficient documentation

## 2024-04-11 DIAGNOSIS — R6521 Severe sepsis with septic shock: Secondary | ICD-10-CM | POA: Diagnosis not present

## 2024-04-11 DIAGNOSIS — C259 Malignant neoplasm of pancreas, unspecified: Secondary | ICD-10-CM | POA: Diagnosis not present

## 2024-04-11 DIAGNOSIS — D649 Anemia, unspecified: Secondary | ICD-10-CM

## 2024-04-11 DIAGNOSIS — K219 Gastro-esophageal reflux disease without esophagitis: Secondary | ICD-10-CM | POA: Diagnosis present

## 2024-04-11 DIAGNOSIS — R197 Diarrhea, unspecified: Secondary | ICD-10-CM

## 2024-04-11 DIAGNOSIS — E8809 Other disorders of plasma-protein metabolism, not elsewhere classified: Secondary | ICD-10-CM | POA: Diagnosis present

## 2024-04-11 DIAGNOSIS — R161 Splenomegaly, not elsewhere classified: Secondary | ICD-10-CM | POA: Diagnosis not present

## 2024-04-11 DIAGNOSIS — L89156 Pressure-induced deep tissue damage of sacral region: Secondary | ICD-10-CM | POA: Diagnosis present

## 2024-04-11 DIAGNOSIS — K567 Ileus, unspecified: Secondary | ICD-10-CM | POA: Diagnosis not present

## 2024-04-11 DIAGNOSIS — J9601 Acute respiratory failure with hypoxia: Secondary | ICD-10-CM

## 2024-04-11 DIAGNOSIS — R58 Hemorrhage, not elsewhere classified: Secondary | ICD-10-CM | POA: Diagnosis not present

## 2024-04-11 DIAGNOSIS — I4891 Unspecified atrial fibrillation: Secondary | ICD-10-CM | POA: Diagnosis not present

## 2024-04-11 DIAGNOSIS — D6481 Anemia due to antineoplastic chemotherapy: Secondary | ICD-10-CM | POA: Diagnosis present

## 2024-04-11 DIAGNOSIS — Z466 Encounter for fitting and adjustment of urinary device: Secondary | ICD-10-CM

## 2024-04-11 DIAGNOSIS — E876 Hypokalemia: Secondary | ICD-10-CM | POA: Diagnosis not present

## 2024-04-11 DIAGNOSIS — B379 Candidiasis, unspecified: Secondary | ICD-10-CM | POA: Diagnosis present

## 2024-04-11 DIAGNOSIS — Z923 Personal history of irradiation: Secondary | ICD-10-CM

## 2024-04-11 DIAGNOSIS — D61818 Other pancytopenia: Secondary | ICD-10-CM | POA: Diagnosis present

## 2024-04-11 DIAGNOSIS — E440 Moderate protein-calorie malnutrition: Secondary | ICD-10-CM | POA: Diagnosis present

## 2024-04-11 DIAGNOSIS — Z6823 Body mass index (BMI) 23.0-23.9, adult: Secondary | ICD-10-CM

## 2024-04-11 DIAGNOSIS — D689 Coagulation defect, unspecified: Secondary | ICD-10-CM | POA: Diagnosis present

## 2024-04-11 DIAGNOSIS — A4159 Other Gram-negative sepsis: Principal | ICD-10-CM | POA: Diagnosis present

## 2024-04-11 DIAGNOSIS — D509 Iron deficiency anemia, unspecified: Secondary | ICD-10-CM | POA: Diagnosis present

## 2024-04-11 DIAGNOSIS — Z7901 Long term (current) use of anticoagulants: Secondary | ICD-10-CM

## 2024-04-11 DIAGNOSIS — Z515 Encounter for palliative care: Secondary | ICD-10-CM

## 2024-04-11 LAB — COMPREHENSIVE METABOLIC PANEL WITH GFR
ALT: 27 U/L (ref 0–44)
AST: 24 U/L (ref 15–41)
Albumin: 2.3 g/dL — ABNORMAL LOW (ref 3.5–5.0)
Alkaline Phosphatase: 245 U/L — ABNORMAL HIGH (ref 38–126)
Anion gap: 8 (ref 5–15)
BUN: 39 mg/dL — ABNORMAL HIGH (ref 8–23)
CO2: 21 mmol/L — ABNORMAL LOW (ref 22–32)
Calcium: 8.1 mg/dL — ABNORMAL LOW (ref 8.9–10.3)
Chloride: 99 mmol/L (ref 98–111)
Creatinine, Ser: 1.04 mg/dL — ABNORMAL HIGH (ref 0.44–1.00)
GFR, Estimated: >60 mL/min (ref >60)
Glucose, Bld: 132 mg/dL — ABNORMAL HIGH (ref 70–99)
Potassium: 4.2 mmol/L (ref 3.5–5.1)
Sodium: 128 mmol/L — ABNORMAL LOW (ref 135–145)
Total Bilirubin: 1.5 mg/dL — ABNORMAL HIGH (ref 0.0–1.2)
Total Protein: 5.1 g/dL — ABNORMAL LOW (ref 6.5–8.1)

## 2024-04-11 LAB — BODY FLUID CELL COUNT WITH DIFFERENTIAL
Eos, Fluid: 0 %
Lymphs, Fluid: 2 %
Monocyte-Macrophage-Serous Fluid: 14 % — ABNORMAL LOW (ref 50–90)
Neutrophil Count, Fluid: 84 % — ABNORMAL HIGH (ref 0–25)
Total Nucleated Cell Count, Fluid: 1610 uL — ABNORMAL HIGH (ref 0–1000)

## 2024-04-11 LAB — CBC
HCT: 25.2 % — ABNORMAL LOW (ref 36.0–46.0)
Hemoglobin: 8 g/dL — ABNORMAL LOW (ref 12.0–15.0)
MCH: 27.7 pg (ref 26.0–34.0)
MCHC: 31.7 g/dL (ref 30.0–36.0)
MCV: 87.2 fL (ref 80.0–100.0)
Platelets: 25 K/uL — CL (ref 150–400)
RBC: 2.89 MIL/uL — ABNORMAL LOW (ref 3.87–5.11)
RDW: 21.5 % — ABNORMAL HIGH (ref 11.5–15.5)
WBC: 9.8 K/uL (ref 4.0–10.5)
nRBC: 0 % (ref 0.0–0.2)

## 2024-04-11 LAB — GRAM STAIN

## 2024-04-11 LAB — PROTIME-INR
INR: 1.6 — ABNORMAL HIGH (ref 0.8–1.2)
Prothrombin Time: 20.3 s — ABNORMAL HIGH (ref 11.4–15.2)

## 2024-04-11 LAB — RETICULOCYTES
Immature Retic Fract: 6.5 % (ref 2.3–15.9)
RBC.: 2.84 MIL/uL — ABNORMAL LOW (ref 3.87–5.11)
Retic Count, Absolute: 3.1 K/uL — ABNORMAL LOW (ref 19.0–186.0)
Retic Ct Pct: <0.4 % — ABNORMAL LOW (ref 0.4–3.1)

## 2024-04-11 LAB — LIPASE, BLOOD: Lipase: <10 U/L — ABNORMAL LOW (ref 11–51)

## 2024-04-11 MED ORDER — PROMETHAZINE HCL 25 MG PO TABS
12.5000 mg | ORAL_TABLET | Freq: Four times a day (QID) | ORAL | Status: DC | PRN
Start: 2024-04-11 — End: 2024-04-21
  Administered 2024-04-12: 12.5 mg via ORAL
  Filled 2024-04-11 (×2): qty 1

## 2024-04-11 MED ORDER — HYDROMORPHONE HCL 1 MG/ML IJ SOLN: 0.5000 mg | INTRAMUSCULAR | Status: DC | PRN

## 2024-04-11 MED ORDER — MIDODRINE HCL 5 MG PO TABS
10.0000 mg | ORAL_TABLET | Freq: Three times a day (TID) | ORAL | Status: DC
  Administered 2024-04-11 – 2024-04-17 (×18): 10 mg via ORAL
  Filled 2024-04-11 (×17): qty 2

## 2024-04-11 MED ORDER — ACETAMINOPHEN 650 MG RE SUPP: 650.0000 mg | Freq: Four times a day (QID) | RECTAL | Status: DC | PRN

## 2024-04-11 MED ORDER — OMEPRAZOLE 20 MG PO CPDR
20.0000 mg | DELAYED_RELEASE_CAPSULE | Freq: Two times a day (BID) | ORAL | Status: DC
  Administered 2024-04-11 – 2024-04-17 (×12): 20 mg via ORAL
  Filled 2024-04-11 (×12): qty 1

## 2024-04-11 MED ORDER — IOHEXOL 300 MG/ML  SOLN
100.0000 mL | Freq: Once | INTRAMUSCULAR | Status: AC | PRN
  Administered 2024-04-11: 100 mL via INTRAVENOUS

## 2024-04-11 MED ORDER — ONDANSETRON HCL 4 MG/2ML IJ SOLN
4.0000 mg | Freq: Once | INTRAMUSCULAR | Status: AC
  Administered 2024-04-11: 4 mg via INTRAVENOUS
  Filled 2024-04-11: qty 2

## 2024-04-11 MED ORDER — APIXABAN 2.5 MG PO TABS: 2.5000 mg | ORAL_TABLET | Freq: Two times a day (BID) | ORAL | Status: DC

## 2024-04-11 MED ORDER — CHLORHEXIDINE GLUCONATE CLOTH 2 % EX PADS
6.0000 | MEDICATED_PAD | Freq: Every day | CUTANEOUS | Status: DC
  Administered 2024-04-11 – 2024-04-26 (×15): 6 via TOPICAL

## 2024-04-11 MED ORDER — SODIUM CHLORIDE 0.9 % IV SOLN
10.0000 mL/h | Freq: Once | INTRAVENOUS | Status: AC
  Administered 2024-04-11: 10 mL/h via INTRAVENOUS

## 2024-04-11 MED ORDER — PHYTONADIONE 5 MG PO TABS
5.0000 mg | ORAL_TABLET | Freq: Once | ORAL | Status: AC
  Administered 2024-04-11: 5 mg via ORAL
  Filled 2024-04-11: qty 1

## 2024-04-11 MED ORDER — SODIUM CHLORIDE 0.9 % IV BOLUS: 500.0000 mL | Freq: Once | INTRAVENOUS | Status: DC

## 2024-04-11 MED ORDER — MORPHINE SULFATE (PF) 4 MG/ML IV SOLN
4.0000 mg | Freq: Once | INTRAVENOUS | Status: AC
  Administered 2024-04-11: 4 mg via INTRAVENOUS
  Filled 2024-04-11: qty 1

## 2024-04-11 MED ORDER — ALBUMIN HUMAN 25 % IV SOLN
25.0000 g | Freq: Four times a day (QID) | INTRAVENOUS | Status: AC
  Administered 2024-04-11: 25 g via INTRAVENOUS
  Administered 2024-04-11: 12.5 g via INTRAVENOUS
  Administered 2024-04-12 (×2): 25 g via INTRAVENOUS
  Filled 2024-04-11 (×4): qty 100

## 2024-04-11 MED ORDER — MORPHINE SULFATE (PF) 4 MG/ML IV SOLN
4.0000 mg | INTRAVENOUS | Status: DC | PRN
  Administered 2024-04-11 – 2024-04-13 (×6): 4 mg via INTRAVENOUS
  Filled 2024-04-11 (×7): qty 1

## 2024-04-11 MED ORDER — LIDOCAINE-EPINEPHRINE (PF) 2 %-1:200000 IJ SOLN
INTRAMUSCULAR | Status: AC
  Filled 2024-04-11: qty 20

## 2024-04-11 MED ORDER — ACETAMINOPHEN 325 MG PO TABS
650.0000 mg | ORAL_TABLET | Freq: Four times a day (QID) | ORAL | Status: DC | PRN
  Administered 2024-04-12: 650 mg via ORAL
  Filled 2024-04-11 (×3): qty 2

## 2024-04-11 NOTE — ED Notes (Signed)
 Lab called stating fluid in bottle sent was not labeled but tubes were.

## 2024-04-11 NOTE — ED Notes (Signed)
 Pt to Ultrasound

## 2024-04-11 NOTE — ED Triage Notes (Signed)
 PT arrives via POV. PT c/o abdominal pain, nausea, vomiting, and diarrhea since last night. Reports she is currently undergoing chemo at [facility] for pancreatic cancer. She is AxOx4.

## 2024-04-11 NOTE — ED Provider Notes (Addendum)
 [place] EMERGENCY DEPARTMENT AT [facility] Provider Note   CSN: [id number] Arrival date & time: 04/11/24  0845     Patient presents with: Abdominal Pain, Emesis, and Diarrhea   Nicole Johnson is a 63 y.o. female.    Abdominal Pain Associated symptoms: diarrhea and vomiting   Emesis Associated symptoms: abdominal pain and diarrhea   Diarrhea Associated symptoms: abdominal pain and vomiting    Patient is a 63 year old female with a past medical history significant for her pancreatic cancer, metastatic recurrence 8/25  Follows with [facility] for metastatic pancreatic cancer.  Patient states that since last night she has been having nausea vomiting and diarrhea and some abdominal pain she states that is crampy and diffuse abdominal pain.  No blood in her emesis.  No fevers at home no chest pain difficulty breathing.       Prior to Admission medications   Medication Sig Start Date End Date Taking? Authorizing Provider  apixaban  (ELIQUIS ) 2.5 MG TABS tablet Take 2.5 mg by mouth. 03/30/24 04/13/24 Yes [provider]  OLANZapine (ZYPREXA) 5 MG tablet Take 5 mg by mouth at bedtime. 03/27/24  Yes [provider]  ADVIL  200 MG CAPS Take 400 mg by mouth every 6 (six) hours as needed (for pain- rotate with 1,300 mg of Tylenol ).    [provider]  Cyanocobalamin  (VITAMIN B-12 PO) Place 1 tablet under the tongue daily.    [provider]  FULPHILA 6 MG/0.6ML injection Inject 12 mg into the skin See admin instructions. Inject 12 mg into the skin every other Wednesday    [provider]  furosemide  (LASIX ) 40 MG tablet Take 40 mg by mouth in the morning. Patient not taking: Reported on 03/23/2024    [provider]  MAGNESIUM  PO Take 2 tablets by mouth daily.    [provider]  midodrine  (PROAMATINE ) 10 MG tablet Take 1 tablet (10 mg total) by mouth 3 (three) times daily with meals. 03/19/24 06/17/24  [place], Camellia PARAS, DO   ondansetron  (ZOFRAN ) 8 MG tablet Take 8 mg by mouth 2 (two) times daily as needed for nausea or vomiting.    [provider]  PRILOSEC OTC 20 MG tablet Take 20 mg by mouth 2 (two) times daily before a meal.    [provider]  prochlorperazine  (COMPAZINE ) 10 MG tablet Take 10 mg by mouth every 6 (six) hours as needed for nausea or vomiting.    [provider]  spironolactone  (ALDACTONE ) 100 MG tablet Take 100 mg by mouth daily. Patient not taking: Reported on 03/23/2024    [provider]  TYLENOL  8 HOUR ARTHRITIS PAIN 650 MG CR tablet Take 1,300 mg by mouth every 8 (eight) hours as needed for pain (rotate with 400 mg of Advil ).    [provider]    Allergies: Other, Codeine, Hydrocodone -acetaminophen , Meloxicam, and Tramadol    Review of Systems  Gastrointestinal:  Positive for abdominal pain, diarrhea and vomiting.    Updated Vital Signs BP 100/63 (BP Location: Left Arm)   Pulse 80   Temp 97.7 F (36.5 C) (Oral)   Resp 15   LMP 01/11/2011   SpO2 99%   Physical Exam Vitals and nursing note reviewed.  Constitutional:      Comments: Uncomfortable appearing 63 year old female pale   HENT:     Head: Normocephalic and atraumatic.     Nose: Nose normal.  Eyes:     General: No scleral icterus. Cardiovascular:  Rate and Rhythm: Normal rate and regular rhythm.     Pulses: Normal pulses.     Heart sounds: Normal heart sounds.  Pulmonary:     Effort: Pulmonary effort is normal. No respiratory distress.     Breath sounds: No wheezing.  Abdominal:     Palpations: Abdomen is soft.     Tenderness: There is abdominal tenderness.  Musculoskeletal:     Cervical back: Normal range of motion.     Right lower leg: No edema.     Left lower leg: No edema.  Skin:    General: Skin is warm and dry.     Capillary Refill: Capillary refill takes less than 2 seconds.  Neurological:     Mental Status: She is alert. Mental status is at baseline.   Psychiatric:        Mood and Affect: Mood normal.        Behavior: Behavior normal.     (all labs ordered are listed, but only abnormal results are displayed) Labs Reviewed  PROTIME-INR - Abnormal; Notable for the following components:      Result Value   Prothrombin Time 20.3 (*)    INR 1.6 (*)    All other components within normal limits  RETICULOCYTES - Abnormal; Notable for the following components:   Retic Ct Pct <0.4 (*)    RBC. 2.84 (*)    Retic Count, Absolute 3.1 (*)    All other components within normal limits  COMPREHENSIVE METABOLIC PANEL WITH GFR - Abnormal; Notable for the following components:   Sodium 128 (*)    CO2 21 (*)    Glucose, Bld 132 (*)    BUN 39 (*)    Creatinine, Ser 1.04 (*)    Calcium  8.1 (*)    Total Protein 5.1 (*)    Albumin  2.3 (*)    Alkaline Phosphatase 245 (*)    Total Bilirubin 1.5 (*)    All other components within normal limits  CBC - Abnormal; Notable for the following components:   RBC 2.89 (*)    Hemoglobin 8.0 (*)    HCT 25.2 (*)    RDW 21.5 (*)    Platelets 25 (*)    All other components within normal limits  LIPASE, BLOOD - Abnormal; Notable for the following components:   Lipase <10 (*)    All other components within normal limits  URINALYSIS, ROUTINE W REFLEX MICROSCOPIC  TYPE AND SCREEN    EKG: EKG Interpretation Date/Time:  Wednesday April 11 2024 09:25:19 EST Ventricular Rate:  90 PR Interval:  172 QRS Duration:  120 QT Interval:  375 QTC Calculation: 449 R Axis:   65  Text Interpretation: Sinus rhythm Atrial premature complex Nonspecific intraventricular conduction delay Nonspecific repol abnormality, diffuse leads Confirmed by Mannie Pac [phone]) on 04/11/2024 11:08:09 AM  Radiology: CT ABDOMEN PELVIS W CONTRAST Result Date: 04/11/2024 EXAM: CT ABDOMEN AND PELVIS WITH CONTRAST 04/11/2024 11:35:03 AM TECHNIQUE: CT of the abdomen and pelvis was performed with the administration of 100 mL of iohexol   (OMNIPAQUE ) 300 MG/ML solution. Multiplanar reformatted images are provided for review. Automated exposure control, iterative reconstruction, and/or weight-based adjustment of the mA/kV was utilized to reduce the radiation dose to as low as reasonably achievable. COMPARISON: 03/15/2024 CLINICAL HISTORY: Abdominal pain, acute, nonlocalized. FINDINGS: LOWER CHEST: No acute abnormality. LIVER: Multiple rounded low densities are noted in the posterior segment of the right hepatic lobe which were not significantly changed compared to prior exam consistent with metastatic disease.  Stable left hepatic lobe density is also noted concerning for metastatic disease. Left hepatic pneumobilia is again noted. GALLBLADDER AND BILE DUCTS: Gallbladder is unremarkable. No biliary ductal dilatation. SPLEEN: Mild splenomegaly is noted. PANCREAS: Continued presence of infiltrative pancreatic head mass superior mesenteric and portal vein and probably celiac axis as noted on prior exam. ADRENAL GLANDS: No acute abnormality. KIDNEYS, URETERS AND BLADDER: No stones in the kidneys or ureters. No hydronephrosis. No perinephric or periureteral stranding. Urinary bladder is unremarkable. GI AND BOWEL: Stomach demonstrates no acute abnormality. Wall thickening of the right and transverse colon is noted which may represent edema or possibly infectious or inflammatory colitis. There is no bowel obstruction. PERITONEUM AND RETROPERITONEUM: Moderate ascites is noted. No free air. VASCULATURE: Aorta is normal in caliber. LYMPH NODES: No lymphadenopathy. REPRODUCTIVE ORGANS: No acute abnormality. BONES AND SOFT TISSUES: No acute osseous abnormality. No focal soft tissue abnormality. IMPRESSION: 1. Stable hepatic metastases in the right posterior segment and left hepatic lobe density consistent with metastasis. 2. Infiltrative pancreatic head mass with involvement of the superior mesenteric vein, portal vein, and probable celiac axis, unchanged. 3.  Right and transverse colonic wall thickening, which may represent edema or infectious/inflammatory colitis. 4. Mild splenomegaly and moderate ascites. Electronically signed by: Lynwood Seip MD 04/11/2024 12:05 PM EST RP Workstation: [id number]     .Critical Care  Performed by: Neldon Hamp RAMAN, PA Authorized by: Neldon Hamp RAMAN, PA   Critical care provider statement:    Critical care time (minutes):  35   Critical care time was exclusive of:  Separately billable procedures and treating other patients and teaching time   Critical care was necessary to treat or prevent imminent or life-threatening deterioration of the following conditions: blood products required.   Critical care was time spent personally by me on the following activities:  Development of treatment plan with patient or surrogate, review of old charts, re-evaluation of patient's condition, pulse oximetry, ordering and review of radiographic studies, ordering and review of laboratory studies, ordering and performing treatments and interventions, obtaining history from patient or surrogate, examination of patient and evaluation of patient's response to treatment   Care discussed with: admitting provider      Medications Ordered in the ED  0.9 %  sodium chloride  infusion (has no administration in time range)  morphine  (PF) 4 MG/ML injection 4 mg (4 mg Intravenous Given 04/11/24 0946)  ondansetron  (ZOFRAN ) injection 4 mg (4 mg Intravenous Given 04/11/24 0945)  iohexol  (OMNIPAQUE ) 300 MG/ML solution 100 mL (100 mLs Intravenous Contrast Given 04/11/24 1116)    Clinical Course as of 04/11/24 1237  Wed Apr 11, 2024  1148 On Wednesday - Resume gemcitabine and add reduced dose nab-paclitaxel (50%). [WF]  1220 IMPRESSION: 1. Stable hepatic metastases in the right posterior segment and left hepatic lobe density consistent with metastasis. 2. Infiltrative pancreatic head mass with involvement of the superior mesenteric vein, portal  vein, and probable celiac axis, unchanged. 3. Right and transverse colonic wall thickening, which may represent edema or infectious/inflammatory colitis. 4. Mild splenomegaly and moderate ascites.  Electronically signed by: Lynwood Seip MD 04/11/2024 12:05 PM EST RP Workstation: [id number]   [WF]    Clinical Course User Index [WF] Neldon Hamp RAMAN, PA                                 Medical Decision Making Amount and/or Complexity of Data Reviewed Labs: ordered. Radiology: ordered. ECG/medicine  tests: ordered.  Risk Prescription drug management. Decision regarding hospitalization.   This patient presents to the ED for concern of abd pain diarrhea, this involves a number of treatment options, and is a complaint that carries with it a moderate risk of complications and morbidity. A differential diagnosis was considered for the patient's symptoms which is discussed below:   The causes of generalized abdominal pain include but are not limited to AAA, mesenteric ischemia, appendicitis, diverticulitis, DKA, gastritis, gastroenteritis, AMI, nephrolithiasis, pancreatitis, peritonitis, adrenal insufficiency,lead poisoning, iron toxicity, intestinal ischemia, constipation, UTI,SBO/LBO, splenic rupture, biliary disease, IBD, IBS, PUD, or hepatitis. Ectopic pregnancy, ovarian torsion, PID.    Co morbidities: Discussed in HPI   Brief History:  Patient is a 63 year old female with a past medical history significant for her pancreatic cancer, metastatic recurrence 8/25  Follows with [facility] for metastatic pancreatic cancer.  Patient states that since last night she has been having nausea vomiting and diarrhea and some abdominal pain she states that is crampy and diffuse abdominal pain.  No blood in her emesis.  No fevers at home no chest pain difficulty breathing.    EMR reviewed including pt PMHx, past surgical history and past visits to ER.   See HPI for more details   Lab  Tests:   I ordered and independently interpreted labs. Labs notable for Worsening thrombocytopenia of 25, hemoglobin downtrending to 8, reticulocyte count undetectably low concerning for aplastic anemia possibly drug-induced in setting of chemotherapy  CMP with mild hyponatremia of 128 creatinine at baseline BUN slightly elevated will give IV fluids alk phos elevated likely secondary to cancer.  Imaging Studies:  Abnormal findings. I personally reviewed all imaging studies. Imaging notable for IMPRESSION: 1. Stable hepatic metastases in the right posterior segment and left hepatic lobe density consistent with metastasis. 2. Infiltrative pancreatic head mass with involvement of the superior mesenteric vein, portal vein, and probable celiac axis, unchanged. 3. Right and transverse colonic wall thickening, which may represent edema or infectious/inflammatory colitis. 4. Mild splenomegaly and moderate ascites.  Electronically signed by: Lynwood Seip MD 04/11/2024 12:05 PM EST RP Workstation: [id number]     Cardiac Monitoring:  The patient was maintained on a cardiac monitor.  I personally viewed and interpreted the cardiac monitored which showed an underlying rhythm of: NSR EKG non-ischemic   Medicines ordered:  I ordered medication including morphine , Zofran , crystalloid infusion for hydration, pain and nausea  Reevaluation of the patient after these medicines showed that the patient improved I have reviewed the patients home medicines and have made adjustments as needed   Critical Interventions:     Consults/Attending Physician   I discussed this case with my attending physician who cosigned this note including patient's presenting symptoms, physical exam, and planned diagnostics and interventions. Attending physician stated agreement with plan or made changes to plan which were implemented.  Discussed with case with oncology who will evaluate patient and make  recommendations.  Reevaluation:  After the interventions noted above I re-evaluated patient and found that they have :improved   Social Determinants of Health:      Problem List / ED Course:  Patient is a 63 year old female with history of metastatic pancreatic cancer here with nausea diarrhea fatigue weakness found to have worsening anemia and thrombocytopenia.  This is likely all secondary to chemotherapy.  Will require admission for observation and continued nausea and pain control.   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

## 2024-04-11 NOTE — Procedures (Signed)
 Ultrasound-guided diagnostic and therapeutic paracentesis performed yielding 4.2 liters of slightly hazy, yellow  fluid. No immediate complications. A portion of the fluid was sent to the lab for preordered studies. EBL none.

## 2024-04-11 NOTE — H&P (Addendum)
 History and Physical    Nicole Johnson FMW:[id number] DOB: 1961/05/17 DOA: 04/11/2024  PCP: Randeen Laine DELENA, MD (Confirm with patient/family/NH records and if not entered, this has to be entered at [facility] point of entry) Patient coming from: Home  I have personally briefly reviewed patient's old medical records in [facility]  Chief Complaint: Feeling nauseous, vomiting, diarrhea, abdominal pain  HPI: Nicole Johnson is a 63 y.o. female with medical history significant of metastatic pancreatic adenocarcinoma to liver, pancytopenia, cirrhosis with ascites, GERD, presented with worsening of symptoms nausea, vomiting abdominal pain.  Patient had her routine chemotherapy last Wednesday and she has been feeling fine afterwards.  Friday, patient and her husband ate a roasted chicken for lunch and both became sick in the evening, their symptoms including abdominal pain nauseous vomiting, they attributed to possible food toxin from the chicken.  Husband however recovered the next day and the patient on the other hand continue to feel sick, she had 1 day of diarrhea and continued to have nauseous and vomiting and decreased appetite for last 3 to 4 days, she could only tolerate some Ensure, and became very dehydrated.  She denied any hematemesis or hematochezia.  Her abdominal pain mainly located on lower abdomen bilateral, constant since yesterday.  She denied any fever or chills.  She told me that she is scheduled to have platelet transfusion at [facility] today.  ED Course: Afebrile, borderline tachycardia blood pressure 92/60, O2 saturation 100% on room air.  CT abdomen pelvis showed no acute findings, moderate ascites.  Blood work showed INR 1.6, sodium 128 BUN 39 creatinine 1.0 glucose 132 albumin 2.3, WBC 9.8 hemoglobin 8.0 compared to baseline 8.2-9.6, platelet 25.  Review of Systems: As per HPI otherwise 14 point review of systems negative.    Past Medical History:  Diagnosis Date    Arthritis    Cancer (HCC)    pancreatic   GERD (gastroesophageal reflux disease)    PONV (postoperative nausea and vomiting)    Prediabetes     Past Surgical History:  Procedure Laterality Date   BILIARY BRUSHING  12/17/2022   Procedure: BILIARY BRUSHING;  Surgeon: Rollin Dover, MD;  Location: [facility];  Service: Gastroenterology;;   BILIARY STENT PLACEMENT N/A 12/17/2022   Procedure: BILIARY STENT PLACEMENT;  Surgeon: Rollin Dover, MD;  Location: [facility];  Service: Gastroenterology;  Laterality: N/A;   BILIARY STENT PLACEMENT N/A 04/05/2023   Procedure: BILIARY STENT PLACEMENT;  Surgeon: Rollin Dover, MD;  Location: [facility];  Service: Gastroenterology;  Laterality: N/A;   COLONOSCOPY N/A 10/11/2023   Procedure: COLONOSCOPY;  Surgeon: Rollin Dover, MD;  Location: [facility];  Service: Gastroenterology;  Laterality: N/A;   ERCP N/A 12/17/2022   Procedure: ENDOSCOPIC RETROGRADE CHOLANGIOPANCREATOGRAPHY (ERCP);  Surgeon: Rollin Dover, MD;  Location: [facility];  Service: Gastroenterology;  Laterality: N/A;   ERCP N/A 04/05/2023   Procedure: ENDOSCOPIC RETROGRADE CHOLANGIOPANCREATOGRAPHY (ERCP);  Surgeon: Rollin Dover, MD;  Location: [facility];  Service: Gastroenterology;  Laterality: N/A;   ESOPHAGOGASTRODUODENOSCOPY N/A 10/11/2023   Procedure: EGD (ESOPHAGOGASTRODUODENOSCOPY);  Surgeon: Rollin Dover, MD;  Location: [facility];  Service: Gastroenterology;  Laterality: N/A;   ESOPHAGOGASTRODUODENOSCOPY (EGD) WITH PROPOFOL  N/A 12/18/2022   Procedure: ESOPHAGOGASTRODUODENOSCOPY (EGD) WITH PROPOFOL ;  Surgeon: Rollin Dover, MD;  Location: [facility];  Service: Gastroenterology;  Laterality: N/A;   ESOPHAGOGASTRODUODENOSCOPY (EGD) WITH PROPOFOL  N/A 12/30/2022   Procedure: ESOPHAGOGASTRODUODENOSCOPY (EGD) WITH PROPOFOL ;  Surgeon: Rollin Dover, MD;  Location[facility];  Service: Gastroenterology;  Laterality:  N/A;   EUS N/A 12/18/2022   Procedure: UPPER ENDOSCOPIC  ULTRASOUND (EUS) LINEAR;  Surgeon: Rollin Dover, MD;  Location: [facility];  Service: Gastroenterology;  Laterality: N/A;   FINE NEEDLE ASPIRATION N/A 12/18/2022   Procedure: FINE NEEDLE ASPIRATION (FNA) LINEAR;  Surgeon: Rollin Dover, MD;  Location: [facility];  Service: Gastroenterology;  Laterality: N/A;   FINE NEEDLE ASPIRATION  12/30/2022   Procedure: FINE NEEDLE ASPIRATION (FNA) LINEAR;  Surgeon: Rollin Dover, MD;  Location: [facility];  Service: Gastroenterology;;   IR PARACENTESIS  01/17/2024   IR PARACENTESIS  02/15/2024   IR PARACENTESIS  03/29/2024   LAPAROSCOPIC ABDOMINAL EXPLORATION     SPHINCTEROTOMY  12/17/2022   Procedure: ANNETT;  Surgeon: Rollin Dover, MD;  Location: [facility];  Service: Gastroenterology;;   CLEDA REMOVAL  04/05/2023   Procedure: STENT REMOVAL;  Surgeon: Rollin Dover, MD;  Location: [facility];  Service: Gastroenterology;;   UPPER ESOPHAGEAL ENDOSCOPIC ULTRASOUND (EUS) N/A 12/30/2022   Procedure: UPPER ESOPHAGEAL ENDOSCOPIC ULTRASOUND (EUS);  Surgeon: Rollin Dover, MD;  Location: [facility];  Service: Gastroenterology;  Laterality: N/A;     reports that she has never smoked. She has never used smokeless tobacco. She reports that she does not currently use alcohol . She reports that she does not use drugs.  Allergies  Allergen Reactions   Other Nausea Only and Other (See Comments)    Certain types of Anesthesia cause SEVERE NAUSEA   Codeine Nausea Only   Hydrocodone -Acetaminophen  Nausea Only and Other (See Comments)    Reaction unconfirmed,ed/might be nausea   Meloxicam Nausea Only   Tramadol Nausea Only    Family History  Problem Relation Age of Onset   Diabetes Mother    Stroke Mother    Lung cancer Father 32       smoked   Diabetes Father    Diabetes Sister    Breast cancer Sister 26   Diabetes Brother    Cancer Brother 60       neck cancer   Heart disease Maternal Grandmother    Breast cancer Maternal Grandmother         dx. <50, double mastectomy   Heart disease Maternal Grandfather      Prior to Admission medications   Medication Sig Start Date End Date Taking? Authorizing Provider  apixaban (ELIQUIS) 2.5 MG TABS tablet Take 2.5 mg by mouth. 03/30/24 04/13/24 Yes [provider]  OLANZapine (ZYPREXA) 5 MG tablet Take 5 mg by mouth at bedtime. 03/27/24  Yes [provider]  ADVIL 200 MG CAPS Take 400 mg by mouth every 6 (six) hours as needed (for pain- rotate with 1,300 mg of Tylenol ).    [provider]  Cyanocobalamin  (VITAMIN B-12 PO) Place 1 tablet under the tongue daily.    [provider]  FULPHILA 6 MG/0.6ML injection Inject 12 mg into the skin See admin instructions. Inject 12 mg into the skin every other Wednesday    [provider]  furosemide  (LASIX ) 40 MG tablet Take 40 mg by mouth in the morning. Patient not taking: Reported on 03/23/2024    [provider]  MAGNESIUM PO Take 2 tablets by mouth daily.    [provider]  midodrine (PROAMATINE) 10 MG tablet Take 1 tablet (10 mg total) by mouth 3 (three) times daily with meals. 03/19/24 06/17/24  [place], Eric J, DO  ondansetron  (ZOFRAN ) 8 MG tablet Take 8 mg by mouth 2 (two) times daily as needed for nausea or vomiting.  [provider]  PRILOSEC OTC 20 MG tablet Take 20 mg by mouth 2 (two) times daily before a meal.    [provider]  prochlorperazine  (COMPAZINE ) 10 MG tablet Take 10 mg by mouth every 6 (six) hours as needed for nausea or vomiting.    [provider]  spironolactone  (ALDACTONE ) 100 MG tablet Take 100 mg by mouth daily. Patient not taking: Reported on 03/23/2024    [provider]  TYLENOL  8 HOUR ARTHRITIS PAIN 650 MG CR tablet Take 1,300 mg by mouth every 8 (eight) hours as needed for pain (rotate with 400 mg of Advil).    [provider]    Physical Exam: Vitals:   04/11/24 0851 04/11/24 1000 04/11/24 1159  BP: 94/67  92/60 100/63  Pulse: (!) 106 85 80  Resp: 18 14 15   Temp: 98 F (36.7 C)  97.7 F (36.5 C)  TempSrc:   Oral  SpO2: 100% 99% 99%    Constitutional: NAD, calm, comfortable Vitals:   04/11/24 0851 04/11/24 1000 04/11/24 1159  BP: 94/67 92/60 100/63  Pulse: (!) 106 85 80  Resp: 18 14 15   Temp: 98 F (36.7 C)  97.7 F (36.5 C)  TempSrc:   Oral  SpO2: 100% 99% 99%   Eyes: PERRL, lids and conjunctivae normal ENMT: Mucous membranes are dry. Posterior pharynx clear of any exudate or lesions.Normal dentition.  Neck: normal, supple, no masses, no thyromegaly Respiratory: clear to auscultation bilaterally, no wheezing, no crackles. Normal respiratory effort. No accessory muscle use.  Cardiovascular: Regular rate and rhythm, no murmurs / rubs / gallops. No extremity edema. 2+ pedal pulses. No carotid bruits.  Abdomen: Mild tenderness on lower abdomen, positive ascites signs, no rebound no guarding, no masses palpated. No hepatosplenomegaly. Bowel sounds positive.  Musculoskeletal: no clubbing / cyanosis. No joint deformity upper and lower extremities. Good ROM, no contractures. Normal muscle tone.  Skin: no rashes, lesions, ulcers. No induration Neurologic: CN 2-12 grossly intact. Sensation intact, DTR normal. Strength 5/5 in all 4.  Psychiatric: Normal judgment and insight. Alert and oriented x 3. Normal mood.     Labs on Admission: I have personally reviewed following labs and imaging studies  CBC: Recent Labs  Lab 04/11/24 0924  WBC 9.8  HGB 8.0*  HCT 25.2*  MCV 87.2  PLT 25*   Basic Metabolic Panel: Recent Labs  Lab 04/11/24 0924  NA 128*  K 4.2  CL 99  CO2 21*  GLUCOSE 132*  BUN 39*  CREATININE 1.04*  CALCIUM 8.1*   GFR: CrCl cannot be calculated (Unknown ideal weight.). Liver Function Tests: Recent Labs  Lab 04/11/24 0924  AST 24  ALT 27  ALKPHOS 245*  BILITOT 1.5*  PROT 5.1*  ALBUMIN 2.3*   Recent Labs  Lab 04/11/24 0924  LIPASE <10*   No  results for input(s): AMMONIA in the last 168 hours. Coagulation Profile: Recent Labs  Lab 04/11/24 0924  INR 1.6*   Cardiac Enzymes: No results for input(s): CKTOTAL, CKMB, CKMBINDEX, TROPONINI in the last 168 hours. BNP (last 3 results) No results for input(s): PROBNP in the last 8760 hours. HbA1C: No results for input(s): HGBA1C in the last 72 hours. CBG: No results for input(s): GLUCAP in the last 168 hours. Lipid Profile: No results for input(s): CHOL, HDL, LDLCALC, TRIG, CHOLHDL, LDLDIRECT in the last 72 hours. Thyroid  Function Tests: No results for input(s): TSH, T4TOTAL, FREET4, T3FREE, THYROIDAB in the last 72 hours. Anemia Panel: Recent Labs  04/11/24 0924  RETICCTPCT <0.4*   Urine analysis:    Component Value Date/Time   COLORURINE AMBER (A) 04/02/2023 1605   APPEARANCEUR HAZY (A) 04/02/2023 1605   LABSPEC 1.028 04/02/2023 1605   PHURINE 5.0 04/02/2023 1605   GLUCOSEU NEGATIVE 04/02/2023 1605   HGBUR NEGATIVE 04/02/2023 1605   BILIRUBINUR NEGATIVE 04/02/2023 1605   KETONESUR 20 (A) 04/02/2023 1605   PROTEINUR 30 (A) 04/02/2023 1605   NITRITE NEGATIVE 04/02/2023 1605   LEUKOCYTESUR TRACE (A) 04/02/2023 1605    Radiological Exams on Admission: CT ABDOMEN PELVIS W CONTRAST Result Date: 04/11/2024 EXAM: CT ABDOMEN AND PELVIS WITH CONTRAST 04/11/2024 11:35:03 AM TECHNIQUE: CT of the abdomen and pelvis was performed with the administration of 100 mL of iohexol  (OMNIPAQUE ) 300 MG/ML solution. Multiplanar reformatted images are provided for review. Automated exposure control, iterative reconstruction, and/or weight-based adjustment of the mA/kV was utilized to reduce the radiation dose to as low as reasonably achievable. COMPARISON: 03/15/2024 CLINICAL HISTORY: Abdominal pain, acute, nonlocalized. FINDINGS: LOWER CHEST: No acute abnormality. LIVER: Multiple rounded low densities are noted in the posterior segment of the right  hepatic lobe which were not significantly changed compared to prior exam consistent with metastatic disease. Stable left hepatic lobe density is also noted concerning for metastatic disease. Left hepatic pneumobilia is again noted. GALLBLADDER AND BILE DUCTS: Gallbladder is unremarkable. No biliary ductal dilatation. SPLEEN: Mild splenomegaly is noted. PANCREAS: Continued presence of infiltrative pancreatic head mass superior mesenteric and portal vein and probably celiac axis as noted on prior exam. ADRENAL GLANDS: No acute abnormality. KIDNEYS, URETERS AND BLADDER: No stones in the kidneys or ureters. No hydronephrosis. No perinephric or periureteral stranding. Urinary bladder is unremarkable. GI AND BOWEL: Stomach demonstrates no acute abnormality. Wall thickening of the right and transverse colon is noted which may represent edema or possibly infectious or inflammatory colitis. There is no bowel obstruction. PERITONEUM AND RETROPERITONEUM: Moderate ascites is noted. No free air. VASCULATURE: Aorta is normal in caliber. LYMPH NODES: No lymphadenopathy. REPRODUCTIVE ORGANS: No acute abnormality. BONES AND SOFT TISSUES: No acute osseous abnormality. No focal soft tissue abnormality. IMPRESSION: 1. Stable hepatic metastases in the right posterior segment and left hepatic lobe density consistent with metastasis. 2. Infiltrative pancreatic head mass with involvement of the superior mesenteric vein, portal vein, and probable celiac axis, unchanged. 3. Right and transverse colonic wall thickening, which may represent edema or infectious/inflammatory colitis. 4. Mild splenomegaly and moderate ascites. Electronically signed by: Lynwood Seip MD 04/11/2024 12:05 PM EST RP Workstation: [id number]    EKG: Independently reviewed.  Sinus rhythm, no acute ST changes.  Assessment/Plan Principal Problem:   Gastroenteritis  (please populate well all problems here in Problem List. (For example, if patient is on BP meds at  home and you resume or decide to hold them, it is a problem that needs to be her. Same for CAD, COPD, HLD and so on)  Intractable nausea vomiting abdominal pain  Possible gastroenteritis - Continue supportive care - Given her history of cirrhosis and severe hypoalbuminemia, we will use albumin to hydrate ordered albumin every 6 hours x 4 doses. -Patient reported 1 day of diarrhea over the weekend, will check GI pathogen panel if patient continued to have diarrhea. - Other DDx, rule out SBP, IR paracentesis ordered.  No fever, hold off antibiotics for now  Worsening of baseline pancytopenia - Oncology consulted - Denied any overt GI bleed, recheck H&H this evening.  Currently there is no indication for transfusion.  Liver cirrhosis -  Paracentesis as above - Check ammonia level - Resume Lasix  once dehydration improved  Hx of recently found portal vein thrombosis -Was started on low dose of Eliquis by her oncology 6-7 days ago, last dose was yesterday morning. Given her worsening of thrombocytopenia and coagulopathy, will have to hold off Eliquis. Patient and family aware.  Chronic coagulopathy Chronic hypoalbuminemia - Vitamin K p.o. x 1 given recheck INR tomorrow - Albumin infusion every 6 hours x 4 for hydration as above.  History of metastatic pancreatic adenocarcinoma - Followed by [facility] oncology  DVT prophylaxis: TED hose Code Status: Full code Family Communication: Husband at bedside Disposition Plan: Expect less than 2 midnight hospital stay Consults called: Oncology Admission status: Telemetry observation   Cort ONEIDA Mana MD [facility] Pager [phone]  04/11/2024, 1:02 PM

## 2024-04-11 NOTE — Plan of Care (Signed)
  Problem: Education: Goal: Knowledge of General Education information will improve Description: Including pain rating scale, medication(s)/side effects and non-pharmacologic comfort measures Outcome: Progressing   Problem: Health Behavior/Discharge Planning: Goal: Ability to manage health-related needs will improve Outcome: Progressing   Problem: Activity: Goal: Risk for activity intolerance will decrease Outcome: Progressing   Problem: Nutrition: Goal: Adequate nutrition will be maintained Outcome: Progressing   Problem: Coping: Goal: Level of anxiety will decrease Outcome: Progressing   Problem: Elimination: Goal: Will not experience complications related to bowel motility Outcome: Progressing Goal: Will not experience complications related to urinary retention Outcome: Progressing   Problem: Pain Managment: Goal: General experience of comfort will improve and/or be controlled Outcome: Progressing

## 2024-04-12 ENCOUNTER — Encounter (HOSPITAL_COMMUNITY): Payer: Self-pay | Admitting: Internal Medicine

## 2024-04-12 ENCOUNTER — Other Ambulatory Visit: Payer: Self-pay

## 2024-04-12 DIAGNOSIS — B961 Klebsiella pneumoniae [K. pneumoniae] as the cause of diseases classified elsewhere: Secondary | ICD-10-CM | POA: Diagnosis not present

## 2024-04-12 DIAGNOSIS — B49 Unspecified mycosis: Secondary | ICD-10-CM | POA: Diagnosis not present

## 2024-04-12 DIAGNOSIS — K529 Noninfective gastroenteritis and colitis, unspecified: Secondary | ICD-10-CM | POA: Diagnosis present

## 2024-04-12 DIAGNOSIS — A4159 Other Gram-negative sepsis: Secondary | ICD-10-CM | POA: Diagnosis present

## 2024-04-12 DIAGNOSIS — N170 Acute kidney failure with tubular necrosis: Secondary | ICD-10-CM | POA: Diagnosis not present

## 2024-04-12 DIAGNOSIS — T451X5A Adverse effect of antineoplastic and immunosuppressive drugs, initial encounter: Secondary | ICD-10-CM | POA: Diagnosis not present

## 2024-04-12 DIAGNOSIS — K567 Ileus, unspecified: Secondary | ICD-10-CM | POA: Diagnosis not present

## 2024-04-12 DIAGNOSIS — Z7189 Other specified counseling: Secondary | ICD-10-CM | POA: Diagnosis not present

## 2024-04-12 DIAGNOSIS — R918 Other nonspecific abnormal finding of lung field: Secondary | ICD-10-CM | POA: Diagnosis not present

## 2024-04-12 DIAGNOSIS — E871 Hypo-osmolality and hyponatremia: Secondary | ICD-10-CM | POA: Diagnosis present

## 2024-04-12 DIAGNOSIS — R64 Cachexia: Secondary | ICD-10-CM | POA: Diagnosis present

## 2024-04-12 DIAGNOSIS — K8689 Other specified diseases of pancreas: Secondary | ICD-10-CM | POA: Diagnosis not present

## 2024-04-12 DIAGNOSIS — D709 Neutropenia, unspecified: Secondary | ICD-10-CM

## 2024-04-12 DIAGNOSIS — R188 Other ascites: Secondary | ICD-10-CM | POA: Diagnosis not present

## 2024-04-12 DIAGNOSIS — I517 Cardiomegaly: Secondary | ICD-10-CM | POA: Diagnosis not present

## 2024-04-12 DIAGNOSIS — R579 Shock, unspecified: Secondary | ICD-10-CM | POA: Diagnosis not present

## 2024-04-12 DIAGNOSIS — R180 Malignant ascites: Secondary | ICD-10-CM | POA: Diagnosis present

## 2024-04-12 DIAGNOSIS — D61818 Other pancytopenia: Secondary | ICD-10-CM | POA: Diagnosis present

## 2024-04-12 DIAGNOSIS — Z66 Do not resuscitate: Secondary | ICD-10-CM | POA: Diagnosis present

## 2024-04-12 DIAGNOSIS — E440 Moderate protein-calorie malnutrition: Secondary | ICD-10-CM | POA: Diagnosis present

## 2024-04-12 DIAGNOSIS — J9601 Acute respiratory failure with hypoxia: Secondary | ICD-10-CM | POA: Diagnosis present

## 2024-04-12 DIAGNOSIS — D696 Thrombocytopenia, unspecified: Secondary | ICD-10-CM

## 2024-04-12 DIAGNOSIS — C787 Secondary malignant neoplasm of liver and intrahepatic bile duct: Secondary | ICD-10-CM | POA: Diagnosis present

## 2024-04-12 DIAGNOSIS — A419 Sepsis, unspecified organism: Secondary | ICD-10-CM | POA: Diagnosis not present

## 2024-04-12 DIAGNOSIS — N179 Acute kidney failure, unspecified: Secondary | ICD-10-CM | POA: Diagnosis not present

## 2024-04-12 DIAGNOSIS — D649 Anemia, unspecified: Secondary | ICD-10-CM | POA: Diagnosis not present

## 2024-04-12 DIAGNOSIS — D509 Iron deficiency anemia, unspecified: Secondary | ICD-10-CM | POA: Diagnosis present

## 2024-04-12 DIAGNOSIS — E611 Iron deficiency: Secondary | ICD-10-CM | POA: Diagnosis not present

## 2024-04-12 DIAGNOSIS — C259 Malignant neoplasm of pancreas, unspecified: Secondary | ICD-10-CM | POA: Diagnosis present

## 2024-04-12 DIAGNOSIS — K659 Peritonitis, unspecified: Secondary | ICD-10-CM | POA: Diagnosis present

## 2024-04-12 DIAGNOSIS — K7460 Unspecified cirrhosis of liver: Secondary | ICD-10-CM | POA: Diagnosis present

## 2024-04-12 DIAGNOSIS — R569 Unspecified convulsions: Secondary | ICD-10-CM | POA: Diagnosis not present

## 2024-04-12 DIAGNOSIS — R570 Cardiogenic shock: Secondary | ICD-10-CM | POA: Diagnosis not present

## 2024-04-12 DIAGNOSIS — C251 Malignant neoplasm of body of pancreas: Secondary | ICD-10-CM | POA: Diagnosis not present

## 2024-04-12 DIAGNOSIS — J9600 Acute respiratory failure, unspecified whether with hypoxia or hypercapnia: Secondary | ICD-10-CM | POA: Diagnosis not present

## 2024-04-12 DIAGNOSIS — C801 Malignant (primary) neoplasm, unspecified: Secondary | ICD-10-CM | POA: Diagnosis not present

## 2024-04-12 DIAGNOSIS — B379 Candidiasis, unspecified: Secondary | ICD-10-CM | POA: Diagnosis not present

## 2024-04-12 DIAGNOSIS — A499 Bacterial infection, unspecified: Secondary | ICD-10-CM | POA: Diagnosis not present

## 2024-04-12 DIAGNOSIS — R578 Other shock: Secondary | ICD-10-CM | POA: Diagnosis not present

## 2024-04-12 DIAGNOSIS — D61811 Other drug-induced pancytopenia: Secondary | ICD-10-CM | POA: Diagnosis not present

## 2024-04-12 DIAGNOSIS — Z515 Encounter for palliative care: Secondary | ICD-10-CM | POA: Diagnosis not present

## 2024-04-12 DIAGNOSIS — E870 Hyperosmolality and hypernatremia: Secondary | ICD-10-CM | POA: Diagnosis not present

## 2024-04-12 DIAGNOSIS — R140 Abdominal distension (gaseous): Secondary | ICD-10-CM | POA: Diagnosis not present

## 2024-04-12 DIAGNOSIS — D611 Drug-induced aplastic anemia: Secondary | ICD-10-CM | POA: Diagnosis not present

## 2024-04-12 DIAGNOSIS — R109 Unspecified abdominal pain: Secondary | ICD-10-CM | POA: Diagnosis not present

## 2024-04-12 DIAGNOSIS — I81 Portal vein thrombosis: Secondary | ICD-10-CM | POA: Diagnosis present

## 2024-04-12 DIAGNOSIS — R4589 Other symptoms and signs involving emotional state: Secondary | ICD-10-CM | POA: Diagnosis not present

## 2024-04-12 DIAGNOSIS — C250 Malignant neoplasm of head of pancreas: Secondary | ICD-10-CM

## 2024-04-12 DIAGNOSIS — Z4682 Encounter for fitting and adjustment of non-vascular catheter: Secondary | ICD-10-CM | POA: Diagnosis not present

## 2024-04-12 DIAGNOSIS — Z452 Encounter for adjustment and management of vascular access device: Secondary | ICD-10-CM | POA: Diagnosis not present

## 2024-04-12 DIAGNOSIS — R4182 Altered mental status, unspecified: Secondary | ICD-10-CM | POA: Diagnosis not present

## 2024-04-12 DIAGNOSIS — I4891 Unspecified atrial fibrillation: Secondary | ICD-10-CM | POA: Diagnosis not present

## 2024-04-12 DIAGNOSIS — R6521 Severe sepsis with septic shock: Secondary | ICD-10-CM | POA: Diagnosis not present

## 2024-04-12 DIAGNOSIS — Z7901 Long term (current) use of anticoagulants: Secondary | ICD-10-CM | POA: Diagnosis not present

## 2024-04-12 DIAGNOSIS — D689 Coagulation defect, unspecified: Secondary | ICD-10-CM | POA: Diagnosis present

## 2024-04-12 DIAGNOSIS — D6481 Anemia due to antineoplastic chemotherapy: Secondary | ICD-10-CM

## 2024-04-12 DIAGNOSIS — B952 Enterococcus as the cause of diseases classified elsewhere: Secondary | ICD-10-CM | POA: Diagnosis present

## 2024-04-12 DIAGNOSIS — R7881 Bacteremia: Secondary | ICD-10-CM | POA: Diagnosis not present

## 2024-04-12 DIAGNOSIS — J984 Other disorders of lung: Secondary | ICD-10-CM | POA: Diagnosis not present

## 2024-04-12 LAB — LACTATE DEHYDROGENASE, PLEURAL OR PERITONEAL FLUID: LD, Fluid: 78 U/L — ABNORMAL HIGH (ref 3–23)

## 2024-04-12 LAB — GLUCOSE, PLEURAL OR PERITONEAL FLUID: Glucose, Fluid: 77 mg/dL

## 2024-04-12 LAB — URINALYSIS, ROUTINE W REFLEX MICROSCOPIC
Bilirubin Urine: NEGATIVE
Glucose, UA: NEGATIVE mg/dL
Ketones, ur: NEGATIVE mg/dL
Leukocytes,Ua: NEGATIVE
Nitrite: NEGATIVE
Protein, ur: NEGATIVE mg/dL
Specific Gravity, Urine: 1.038 — ABNORMAL HIGH (ref 1.005–1.030)
pH: 5 (ref 5.0–8.0)

## 2024-04-12 LAB — CBC
HCT: 18.6 % — ABNORMAL LOW (ref 36.0–46.0)
Hemoglobin: 6.1 g/dL — CL (ref 12.0–15.0)
MCH: 28.9 pg (ref 26.0–34.0)
MCHC: 32.8 g/dL (ref 30.0–36.0)
MCV: 88.2 fL (ref 80.0–100.0)
Platelets: 16 K/uL — CL (ref 150–400)
RBC: 2.11 MIL/uL — ABNORMAL LOW (ref 3.87–5.11)
RDW: 21.8 % — ABNORMAL HIGH (ref 11.5–15.5)
WBC: 5.2 K/uL (ref 4.0–10.5)
nRBC: 0 % (ref 0.0–0.2)

## 2024-04-12 LAB — COMPREHENSIVE METABOLIC PANEL WITH GFR
ALT: 19 U/L (ref 0–44)
AST: 22 U/L (ref 15–41)
Albumin: 2.9 g/dL — ABNORMAL LOW (ref 3.5–5.0)
Alkaline Phosphatase: 147 U/L — ABNORMAL HIGH (ref 38–126)
Anion gap: 8 (ref 5–15)
BUN: 36 mg/dL — ABNORMAL HIGH (ref 8–23)
CO2: 22 mmol/L (ref 22–32)
Calcium: 8.4 mg/dL — ABNORMAL LOW (ref 8.9–10.3)
Chloride: 100 mmol/L (ref 98–111)
Creatinine, Ser: 1.12 mg/dL — ABNORMAL HIGH (ref 0.44–1.00)
GFR, Estimated: 55 mL/min — ABNORMAL LOW (ref >60)
Glucose, Bld: 96 mg/dL (ref 70–99)
Potassium: 4.6 mmol/L (ref 3.5–5.1)
Sodium: 131 mmol/L — ABNORMAL LOW (ref 135–145)
Total Bilirubin: 1.8 mg/dL — ABNORMAL HIGH (ref 0.0–1.2)
Total Protein: 4.9 g/dL — ABNORMAL LOW (ref 6.5–8.1)

## 2024-04-12 LAB — AMMONIA: Ammonia: 31 umol/L (ref 9–35)

## 2024-04-12 LAB — HIV ANTIBODY (ROUTINE TESTING W REFLEX): HIV Screen 4th Generation wRfx: NONREACTIVE

## 2024-04-12 LAB — PH, BODY FLUID: pH, Body Fluid: 7.3

## 2024-04-12 LAB — PREPARE RBC (CROSSMATCH)

## 2024-04-12 LAB — PROTIME-INR
INR: 1.9 — ABNORMAL HIGH (ref 0.8–1.2)
Prothrombin Time: 22.6 s — ABNORMAL HIGH (ref 11.4–15.2)

## 2024-04-12 MED ORDER — POLYETHYLENE GLYCOL 3350 17 G PO PACK
17.0000 g | PACK | Freq: Two times a day (BID) | ORAL | Status: AC
  Administered 2024-04-12 (×2): 17 g via ORAL
  Filled 2024-04-12 (×2): qty 1

## 2024-04-12 MED ORDER — SODIUM CHLORIDE 0.9% FLUSH: 10.0000 mL | INTRAVENOUS | Status: DC | PRN

## 2024-04-12 MED ORDER — SODIUM CHLORIDE 0.9% IV SOLUTION: Freq: Once | INTRAVENOUS | Status: AC

## 2024-04-12 MED ORDER — SODIUM CHLORIDE 0.9% FLUSH
10.0000 mL | Freq: Two times a day (BID) | INTRAVENOUS | Status: DC
  Administered 2024-04-12 – 2024-04-15 (×8): 10 mL
  Administered 2024-04-16: 30 mL
  Administered 2024-04-16 – 2024-04-17 (×2): 10 mL
  Administered 2024-04-17: 20 mL
  Administered 2024-04-18 – 2024-04-19 (×4): 10 mL
  Administered 2024-04-20: 40 mL
  Administered 2024-04-20: 10 mL
  Administered 2024-04-21: 30 mL
  Administered 2024-04-21: 10 mL
  Administered 2024-04-22: 30 mL
  Administered 2024-04-22 – 2024-04-23 (×2): 10 mL
  Administered 2024-04-23: 20 mL
  Administered 2024-04-24 – 2024-04-25 (×3): 10 mL
  Administered 2024-04-25: 30 mL
  Administered 2024-04-26 – 2024-04-27 (×2): 10 mL

## 2024-04-12 NOTE — TOC Initial Note (Addendum)
 Transition of Care [facility]) - Initial/Assessment Note    Patient Details  Name: Nicole Johnson MRN: [id number] Date of Birth: [birth year]  Transition of Care [facility]) CM/SW Contact:    Sonda Manuella Quill, RN Phone Number: 04/12/2024, 6:08 PM  Clinical Narrative:                 [place] w/ pt and spouse Loann Chahal [phone]) in room; pt lives at home; he plans to return at d/c w/ his support; Mr Hendel will provide transportation; her verified insurance/PCP verified; he denied pt is experiencing SDOH risks; pt's spouse said she does not have DME, HH services, or home oxygen; IP CM is following.  Expected Discharge Plan: Home/Self Care Barriers to Discharge: Continued Medical Work up   Patient Goals and CMS Choice Patient states their goals for this hospitalization and ongoing recovery are:: home     [place] ownership interest in [facility].provided to:: Patient    Expected Discharge Plan and Services   Discharge Planning Services: CM Consult   Living arrangements for the past 2 months: Single Family Home                 DME Arranged: N/A DME Agency: NA       HH Arranged: NA HH Agency: NA        Prior Living Arrangements/Services Living arrangements for the past 2 months: Single Family Home Lives with:: Spouse Patient language and need for interpreter reviewed:: Yes Do you feel safe going back to the place where you live?: Yes      Need for Family Participation in Patient Care: Yes (Comment) Care giver support system in place?: Yes (comment) Current home services:  (n/a) Criminal Activity/Legal Involvement Pertinent to Current Situation/Hospitalization: No - Comment as needed  Activities of Daily Living   ADL Screening (condition at time of admission) Independently performs ADLs?: Yes (appropriate for developmental age) Is the patient deaf or have difficulty hearing?: No Does the patient have difficulty seeing, even when wearing glasses/contacts?:  No Does the patient have difficulty concentrating, remembering, or making decisions?: No  Permission Sought/Granted Permission sought to share information with : Case Manager Permission granted to share information with : Yes, Verbal Permission Granted  Share Information with NAME: Case Manager     Permission granted to share info w Relationship: Yobana Culliton (spouse) [phone]     Emotional Assessment Appearance:: Appears stated age Attitude/Demeanor/Rapport: Gracious Affect (typically observed): Accepting Orientation: : Oriented to Self, Oriented to Place, Oriented to  Time, Oriented to Situation Alcohol  / Substance Use: Not Applicable Psych Involvement: No (comment)  Admission diagnosis:  Gastroenteritis [K52.9] 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] Thrombocytopenia [D69.6] Other ascites [R18.8] Abdominal pain, unspecified abdominal location [R10.9] Diarrhea, unspecified type [R19.7] Patient Active Problem List   Diagnosis Date Noted   Antineoplastic chemotherapy induced anemia 04/12/2024   Gastroenteritis 04/11/2024   Bacteremia 03/24/2024   Nausea 03/24/2024   Cirrhosis of liver (HCC) 03/24/2024   Hepatitis 03/15/2024   Ascites 01/11/2024   Portal hypertension (HCC) 10/24/2023   Hypotension 10/24/2023   Protein-calorie malnutrition, severe 10/11/2023   Hypothyroidism 10/08/2023   Moderate protein malnutrition 10/08/2023   Pancytopenia (HCC) 10/08/2023   Symptomatic anemia 10/08/2023   Pain of left thumb 08/25/2023   Bilateral lower extremity edema 04/08/2023   Thrombocytopenia 04/03/2023   Hyponatremia 04/03/2023   Genetic testing 01/28/2023   Pancreatic cancer (HCC) 01/06/2023   Jaundice 12/16/2022   Allergic rhinitis 09/09/2022   Routine general medical  examination at a health care facility 01/01/2021   Muscle cramps 07/10/2020   Current use of proton pump inhibitor 07/10/2020   B12 deficiency 07/10/2020   Prediabetes 12/26/2019   Gastroesophageal reflux  disease 12/02/2017   Stress incontinence of urine 07/26/2016   PCP:  Randeen Laine LABOR, MD Pharmacy:   [id number] [address], [place] - [address] AT [facility] OF [address] [place] [address] Phone: [phone] Fax: [phone]     Social Drivers of Health (SDOH) Social History: SDOH Screenings   Food Insecurity: No Food Insecurity (04/12/2024)  Housing: Low Risk  (04/12/2024)  Transportation Needs: No Transportation Needs (04/12/2024)  Utilities: Not At Risk (04/12/2024)  Depression (PHQ2-9): Low Risk  (08/25/2023)  Financial Resource Strain: Low Risk  (06/08/2023)   Received from [facility]  Social Connections: Moderately Isolated (06/08/2023)  Tobacco Use: Low Risk  (04/12/2024)   SDOH Interventions: Food Insecurity Interventions: Intervention Not Indicated, Inpatient TOC Housing Interventions: Intervention Not Indicated, Inpatient TOC Transportation Interventions: Intervention Not Indicated, Inpatient TOC Utilities Interventions: Intervention Not Indicated, Inpatient TOC   Readmission Risk Interventions    04/12/2024    6:06 PM 04/04/2023   10:55 AM 12/17/2022   12:58 PM  Readmission Risk Prevention Plan  Post Dischage Appt  Complete Complete  Medication Screening  Complete Complete  Transportation Screening Complete Complete Complete  PCP or Specialist Appt within 3-5 Days Complete    HRI or Home Care Consult Complete    Social Work Consult for Recovery Care Planning/Counseling Complete    Palliative Care Screening Not Applicable    Medication Review Oceanographer) Complete

## 2024-04-12 NOTE — Progress Notes (Addendum)
  [facility] Progress Note  Patient: Nicole Johnson     FMW:[id number]  DOA: 04/11/2024   PCP: Randeen Laine DELENA, MD       Brief hospital course: 63 y.o. female with medical history significant of metastatic pancreatic adenocarcinoma to liver, pancytopenia, cirrhosis with ascites, GERD, presented with worsening of abdominal pain.  Subjective:  Abdominal pain has resolved after paracentesis.  Assessment and Plan: Principal Problem: Abdominal pain, large ascites - The patient states that her main complaint was lower abdominal pain-she does not admit to any nausea vomiting or diarrhea - Abdominal pain has improved after her paracentesis-4 L of fluid removed - The patient states that she does get regular paracentesis performed as outpatient -Ascitic fluid shows white blood cell count of 1610-84% of which are neutrophils-Gram stain is negative for organisms  - She states her oncologist feels that ascites is secondary to underlying cancer  Anemia and thrombocytopenia - Hemoglobin dropped from 8.0-6.1 today and platelets dropped from 25-16 - No signs of bleeding - She regularly receives platelet transfusions as outpatient and occasionally receives PRBCs - Typically her dropping blood counts are secondary to chemotherapy - She will receive 1 unit of packed red blood cells and 1 unit of platelets today  Pancreatic adenocarcinoma - She receives chemotherapy-is managed at [facility]  Elevated INR - INR 1.9 - Vitamin K given-follow-up in a.m.  Fever - Temp of 101.1 noted at 1301 today - Follow- may need further w/u  Cirrhosis of the liver    Code Status: Full Code Total time on patient care: 35 minutes DVT prophylaxis:  Place TED hose Start: 04/11/24 1301     Objective:   Vitals:   04/12/24 0110 04/12/24 0536 04/12/24 0537 04/12/24 0539  BP: (!) 109/55 (!) 90/53 (!) 100/51   Pulse: (!) 106 99 (!) 101   Resp: 16 17  15   Temp: 99.9 F (37.7 C) 99.7 F (37.6 C)    TempSrc:  Oral Oral    SpO2: 96% 91% 91%   Weight:      Height:       Filed Weights   04/11/24 1744  Weight: 62.7 kg   Exam: General exam: Appears comfortable  HEENT: oral mucosa moist Respiratory system: Clear to auscultation.  Cardiovascular system: S1 & S2 heard  Gastrointestinal system: Abdomen soft, non-tender, nondistended. Normal bowel sounds   Extremities: No cyanosis, clubbing-mild bilateral edema Psychiatry:  Mood & affect appropriate.      CBC: Recent Labs  Lab 04/11/24 0924  WBC 9.8  HGB 8.0*  HCT 25.2*  MCV 87.2  PLT 25*   Basic Metabolic Panel: Recent Labs  Lab 04/11/24 0924  NA 128*  K 4.2  CL 99  CO2 21*  GLUCOSE 132*  BUN 39*  CREATININE 1.04*  CALCIUM 8.1*     Scheduled Meds:  Chlorhexidine  Gluconate Cloth  6 each Topical Q0600   midodrine   10 mg Oral TID WC   omeprazole   20 mg Oral BID AC    Imaging and lab data personally reviewed   Author: Hermen Mario  04/12/2024 7:54 AM  To contact [facility]>   Check the care team in [facility] and look for the attending/consulting [facility] provider listed  Log into [email or web address] and use [facility] universal password   Go to> [facility]  and find provider  If you still have difficulty reaching the provider, please page the [facility] (Director on Call) for the Hospitalists listed on amion

## 2024-04-12 NOTE — Consult Note (Addendum)
 ADDENDUM:  Patient was personally and independently interviewed, examined and relevant elements of the history of present illness were reviewed in details and an assessment and plan was created. All elements of the patient's history of present illness, assessment and plan were discussed in detail with Nicole JINNY Brunner, NP. The above documentation reflects our combined findings assessment and plan.   Briefly, 63 year old lady with pancreatic cancer with liver mets, currently being followed at [facility] by Dr. Donato, receiving chemotherapy with gemcitabine and Abraxane, last received gemcitabine alone on 04/04/2024.  She got admitted to the hospital yesterday after she presented with abdominal pain, nausea, vomiting, diarrhea and was found to have severe anemia and thrombocytopenia.  No neutropenia.  Apparently she has been getting platelet transfusions/platelet boosting injection once a week at [facility] for chronic thrombocytopenia.  Continue to monitor CBCD daily and transfuse as needed to maintain platelet count above 20,000 and hemoglobin above 7 at least.  She will continue to follow-up with Dr. Donato at [facility], once discharged.  Please call with any questions.  No additional recommendations from our standpoint at this time.  [facility] CONSULT NOTE  Patient Care Team: Tower, Laine LABOR, MD as PCP - General (Family Medicine) Lanny Callander, MD as Consulting Physician (Hematology and Oncology)  CHIEF COMPLAINTS/PURPOSE OF CONSULTATION:  Metastatic pancreatic cancer  REFERRING PHYSICIAN: Dr. Earley  HISTORY OF PRESENTING ILLNESS:  Nicole Johnson 63 y.o. female with history significant for pancreatic cancer which is treated at [facility].  Patient was admitted 04/11/2024 from home due to complaints of abdominal pain, nausea, vomiting, and diarrhea.  Stated that she had her chemotherapy last week Wednesday and was feeling fine.  Thinks that she and her husband ate some bad  chicken and they both had abdominal pain, food poisoning?  States that her husband recovered very quickly however she continued to feel bad.  Due to persistent patient's history of malignancy, oncology evaluation has been requested. Patient is seen awake and alert and oriented x 4 laying in bed.  She is slightly jaundiced and cachectic appearing.  She is a good historian and along with her husband details her medical history. Medical history as stated is significant for pancreatic cancer with mets to liver, pancytopenia, cirrhosis, ascites. Surgical history includes biliary stent placement that she said was done at [facility]. Family oncologic history significant for father with lung cancer and a brother with throat cancer. Social history is noncontributory, denies tobacco use, denies alcohol  use, denies illicit or recreational drug use.  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     I have reviewed her chart and materials related to her cancer extensively and collaborated history with the patient. Summary of oncologic history is as follows: Oncology History Overview Note   Cancer Staging  Pancreatic cancer [facility]) Staging form: Exocrine Pancreas, AJCC 8th Edition - Clinical: Stage IB (cT2, cN0, cM0) - Signed by Lanny Callander, MD on 01/06/2023 Total positive nodes: 0     Pancreatic cancer (HCC)  12/16/2022 Imaging   MR Abdomen MRCP W WO CONTAST   IMPRESSION: 1. Poorly defined infiltrative mass in the head of the pancreas with vascular involvement, and occlusion of the bile ducts in the region of the porta hepatis, as detailed above. Findings are highly concerning for primary pancreatic malignancy. Further evaluation with endoscopic ultrasound and tissue sampling should be considered. 2. Severe dilatation  of the gallbladder. Probable obstruction of the cystic duct in the region of the  porta hepatis. Gallbladder wall does not appear thickened or edematous, and there is no surrounding pericholecystic fluid or overt surrounding inflammatory changes to clearly indicate an acute cholecystitis at this time.     12/30/2022 Pathology Results    FINAL MICROSCOPIC DIAGNOSIS:  A. ESOPHAGEAL, PANCREATIC HEAD MASS, FINE NEEDLE ASPIRATION:  - Adenocarcinoma    01/06/2023 Initial Diagnosis   Pancreatic cancer (HCC)   01/06/2023 Cancer Staging   Staging form: Exocrine Pancreas, AJCC 8th Edition - Clinical: Stage IB (cT2, cN0, cM0) - Signed by Lanny Callander, MD on 01/06/2023 Total positive nodes: 0   01/26/2023 - 01/26/2023 Chemotherapy   Patient is on Treatment Plan : PANCREAS NALIRIFOX D1, 15 Q28D      Genetic Testing   [facility] CancerNext-Expanded Panel+RNA was Negative. Report date is 01/21/2023.   The CancerNext-Expanded gene panel offered by [facility] and includes sequencing, rearrangement, and RNA analysis for the following 71 genes: AIP, ALK, APC, ATM, AXIN2, BAP1, BARD1, BMPR1A, BRCA1, BRCA2, BRIP1, CDC73, CDH1, CDK4, CDKN1B, CDKN2A, CHEK2, CTNNA1, DICER1, FH, FLCN, KIF1B, LZTR1, MAX, MEN1, MET, MLH1, MSH2, MSH3, MSH6, MUTYH, NF1, NF2, NTHL1, PALB2, PHOX2B, PMS2, POT1, PRKAR1A, PTCH1, PTEN, RAD51C, RAD51D, RB1, RET, SDHA, SDHAF2, SDHB, SDHC, SDHD, SMAD4, SMARCA4, SMARCB1, SMARCE1, STK11, SUFU, TMEM127, TP53, TSC1, TSC2, and VHL (sequencing and deletion/duplication); EGFR, EGLN1, HOXB13, KIT, MITF, PDGFRA, POLD1, and POLE (sequencing only); EPCAM and GREM1 (deletion/duplication only).      ASSESSMENT & PLAN:  Pancreatic adenocarcinoma with liver mets - Initially diagnosed 01/06/2023 as a stage Ib. - Patient was seen at [facility] by Dr. Lanny however transferred her care to [facility] on 01/27/2023.  Mass was deemed unresectable therefore she started chemotherapy.  Status post FOLFOX chemotherapy regimen.  Due to cytopenias treatment plan was changed to radiation with Xeloda.   -- More recently was  been started on gemcitabine/nab-paclitaxel.  She received gemcitabine alone on 04/04/2024. - CA 19-9 levels being trended by primary oncologist, last level 197. - Patient currently is treated at [facility] oncologist Dr. Donato.   -- Medical oncology/Dr. Marshal Eskew following during this hospitalization and will make further recommendations.  Pancytopenia - Secondary to recent oncologic therapy + malignancy Leukopenia/neutropenia - WBC low - States that she receives G CSF and most recently received last week after chemotherapy treatment.  However noted on [facility] documentation 04/04/2024 that G-CSF was held. Anemia - Hemoglobin 6.1 - Recommend PRBC transfusion for hemoglobin <7.0.  1 unit PRBC ordered for transfusion today - Status post IV iron given at [facility] hematologist office 02/22/2024. Thrombocytopenia - Platelets 16K - Recommend platelet transfusion for counts <20 K or <50 K with bleeding.  1 unit platelets ordered for transfusion today - Previously prescribed Promacta  75 mg p.o. daily - States that she has been on injection every week for her platelets.  This is confirmed in [facility] documentation 04/04/2024. - Continue to monitor CBC with differential closely  Ascites - Secondary to malignancy - Status post paracentesis 03/29/2024 with 4 L removed. Repeat para done 11/19 with 4.2 liters slightly hazy yellow fluid removed.    MEDICAL HISTORY:  Past Medical History:  Diagnosis Date   Arthritis    Cancer (HCC)    pancreatic   GERD (gastroesophageal reflux disease)    PONV (postoperative nausea and vomiting)    Prediabetes     SURGICAL HISTORY: Past Surgical History:  Procedure Laterality Date   BILIARY BRUSHING  12/17/2022   Procedure:  BILIARY BRUSHING;  Surgeon: Rollin Dover, MD;  Location: [facility];  Service: Gastroenterology;;   BILIARY STENT PLACEMENT N/A 12/17/2022   Procedure: BILIARY STENT PLACEMENT;  Surgeon: Rollin Dover, MD;  Location: [facility];  Service:  Gastroenterology;  Laterality: N/A;   BILIARY STENT PLACEMENT N/A 04/05/2023   Procedure: BILIARY STENT PLACEMENT;  Surgeon: Rollin Dover, MD;  Location: [facility];  Service: Gastroenterology;  Laterality: N/A;   COLONOSCOPY N/A 10/11/2023   Procedure: COLONOSCOPY;  Surgeon: Rollin Dover, MD;  Location: [facility];  Service: Gastroenterology;  Laterality: N/A;   ERCP N/A 12/17/2022   Procedure: ENDOSCOPIC RETROGRADE CHOLANGIOPANCREATOGRAPHY (ERCP);  Surgeon: Rollin Dover, MD;  Location: [facility];  Service: Gastroenterology;  Laterality: N/A;   ERCP N/A 04/05/2023   Procedure: ENDOSCOPIC RETROGRADE CHOLANGIOPANCREATOGRAPHY (ERCP);  Surgeon: Rollin Dover, MD;  Location: [facility];  Service: Gastroenterology;  Laterality: N/A;   ESOPHAGOGASTRODUODENOSCOPY N/A 10/11/2023   Procedure: EGD (ESOPHAGOGASTRODUODENOSCOPY);  Surgeon: Rollin Dover, MD;  Location: [facility];  Service: Gastroenterology;  Laterality: N/A;   ESOPHAGOGASTRODUODENOSCOPY (EGD) WITH PROPOFOL  N/A 12/18/2022   Procedure: ESOPHAGOGASTRODUODENOSCOPY (EGD) WITH PROPOFOL ;  Surgeon: Rollin Dover, MD;  Location: [facility];  Service: Gastroenterology;  Laterality: N/A;   ESOPHAGOGASTRODUODENOSCOPY (EGD) WITH PROPOFOL  N/A 12/30/2022   Procedure: ESOPHAGOGASTRODUODENOSCOPY (EGD) WITH PROPOFOL ;  Surgeon: Rollin Dover, MD;  Location[facility];  Service: Gastroenterology;  Laterality: N/A;   EUS N/A 12/18/2022   Procedure: UPPER ENDOSCOPIC ULTRASOUND (EUS) LINEAR;  Surgeon: Rollin Dover, MD;  Location: [facility];  Service: Gastroenterology;  Laterality: N/A;   FINE NEEDLE ASPIRATION N/A 12/18/2022   Procedure: FINE NEEDLE ASPIRATION (FNA) LINEAR;  Surgeon: Rollin Dover, MD;  Location: [facility];  Service: Gastroenterology;  Laterality: N/A;   FINE NEEDLE ASPIRATION  12/30/2022   Procedure: FINE NEEDLE ASPIRATION (FNA) LINEAR;  Surgeon: Rollin Dover, MD;  Location: [facility] ENDOSCOPY;  Service: Gastroenterology;;   IR  PARACENTESIS  01/17/2024   IR PARACENTESIS  02/15/2024   IR PARACENTESIS  03/29/2024   LAPAROSCOPIC ABDOMINAL EXPLORATION     SPHINCTEROTOMY  12/17/2022   Procedure: ANNETT;  Surgeon: Rollin Dover, MD;  Location: [facility];  Service: Gastroenterology;;   CLEDA REMOVAL  04/05/2023   Procedure: STENT REMOVAL;  Surgeon: Rollin Dover, MD;  Location: [facility];  Service: Gastroenterology;;   UPPER ESOPHAGEAL ENDOSCOPIC ULTRASOUND (EUS) N/A 12/30/2022   Procedure: UPPER ESOPHAGEAL ENDOSCOPIC ULTRASOUND (EUS);  Surgeon: Rollin Dover, MD;  Location: [facility] ENDOSCOPY;  Service: Gastroenterology;  Laterality: N/A;    SOCIAL HISTORY: Social History   Socioeconomic History   Marital status: Married    Spouse name: Not on file   Number of children: 1   Years of education: Not on file   Highest education level: Not on file  Occupational History   Not on file  Tobacco Use   Smoking status: Never   Smokeless tobacco: Never  Vaping Use   Vaping status: Never Used  Substance and Sexual Activity   Alcohol  use: Not Currently    Comment: occ   Drug use: Never   Sexual activity: Yes    Partners: Male  Other Topics Concern   Not on file  Social History Narrative   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    Son is 61 yo and lives with them     Social Drivers of Health   Financial Resource Strain: Low Risk  (06/08/2023)   Received from [facility]   Overall Financial Resource Strain (CARDIA)    Difficulty of Paying Living  Expenses: Not hard at all  Food Insecurity: No Food Insecurity (04/12/2024)   Hunger Vital Sign    Worried About Running Out of Food in the Last Year: Never true    Ran Out of Food in the Last Year: Never true  Transportation Needs: No Transportation Needs (04/12/2024)   PRAPARE - Administrator, Civil Service (Medical): No    Lack of Transportation (Non-Medical): No  Physical Activity: Not on file  Stress: Not on file   Social Connections: Moderately Isolated (06/08/2023)   Social Connection and Isolation Panel    Frequency of Communication with Friends and Family: More than three times a week    Frequency of Social Gatherings with Friends and Family: More than three times a week    Attends Religious Services: Never    Database Administrator or Organizations: No    Attends Banker Meetings: Never    Marital Status: Married  Catering Manager Violence: Not At Risk (04/12/2024)   Humiliation, Afraid, Rape, and Kick questionnaire    Fear of Current or Ex-Partner: No    Emotionally Abused: No    Physically Abused: No    Sexually Abused: No    FAMILY HISTORY: Family History  Problem Relation Age of Onset   Diabetes Mother    Stroke Mother    Lung cancer Father 29       smoked   Diabetes Father    Diabetes Sister    Breast cancer Sister 40   Diabetes Brother    Cancer Brother 60       neck cancer   Heart disease Maternal Grandmother    Breast cancer Maternal Grandmother        dx. <50, double mastectomy   Heart disease Maternal Grandfather      PHYSICAL EXAMINATION: ECOG PERFORMANCE STATUS: 3 - Symptomatic, >50% confined to bed  Vitals:   04/12/24 0537 04/12/24 0539  BP: (!) 100/51   Pulse: (!) 101   Resp:  15  Temp:    SpO2: 91%    Filed Weights   04/11/24 1744  Weight: 138 lb 3.7 oz (62.7 kg)    GENERAL: alert, no distress and comfortable SKIN: +jaundiced skin color, texture, turgor are normal, no rashes or significant lesions EYES: +scleral icterus OROPHARYNX: no exudate, no erythema and lips, buccal mucosa, and tongue normal  NECK: supple, thyroid  normal size, non-tender, without nodularity LYMPH: no palpable lymphadenopathy in the cervical, axillary or inguinal LUNGS: clear to auscultation and percussion with normal breathing effort HEART: regular rate & rhythm and no murmurs and no lower extremity edema ABDOMEN: abdomen soft, non-tender and normal bowel  sounds MUSCULOSKELETAL: no cyanosis of digits and no clubbing  PSYCH: alert & oriented x 3 with fluent speech NEURO: no focal motor/sensory deficits   ALLERGIES:  is allergic to other, codeine, hydrocodone -acetaminophen , meloxicam, olanzapine, oxycodone , and tramadol.  MEDICATIONS:  Current Facility-Administered Medications  Medication Dose Route Frequency Provider Last Rate Last Admin   acetaminophen  (TYLENOL ) tablet 650 mg  650 mg Oral Q6H PRN Laurita Manor T, MD       Or   acetaminophen  (TYLENOL ) suppository 650 mg  650 mg Rectal Q6H PRN Laurita Manor DASEN, MD       Chlorhexidine  Gluconate Cloth 2 % PADS 6 each  6 each Topical Q0600 Laurita Manor DASEN, MD   6 each at 04/11/24 1745   midodrine (PROAMATINE) tablet 10 mg  10 mg Oral TID WC Laurita Manor DASEN, MD  10 mg at 04/12/24 0925   morphine  (PF) 4 MG/ML injection 4 mg  4 mg Intravenous Q4H PRN Laurita Manor T, MD   4 mg at 04/12/24 0115   omeprazole  (PRILOSEC) capsule 20 mg  20 mg Oral BID AC Zhang, Ping T, MD   20 mg at 04/12/24 0925   promethazine (PHENERGAN) tablet 12.5 mg  12.5 mg Oral Q6H PRN Laurita Manor T, MD   12.5 mg at 04/12/24 9264   sodium chloride  flush (NS) 0.9 % injection 10-40 mL  10-40 mL Intracatheter Q12H Earley Saucer, MD   10 mL at 04/12/24 9077   sodium chloride  flush (NS) 0.9 % injection 10-40 mL  10-40 mL Intracatheter PRN Earley Saucer, MD         LABORATORY DATA:  I have reviewed the data as listed Lab Results  Component Value Date   WBC 5.2 04/12/2024   HGB 6.1 (LL) 04/12/2024   HCT 18.6 (L) 04/12/2024   MCV 88.2 04/12/2024   PLT 16 (LL) 04/12/2024   Recent Labs    01/11/24 1112 01/18/24 1056 03/19/24 0355 04/11/24 0924 04/12/24 0813  NA 136   < > 132* 128* 131*  K 3.9   < > 4.0 4.2 4.6  CL 101   < > 101 99 100  CO2 31   < > 24 21* 22  GLUCOSE 124*   < > 123* 132* 96  BUN 12   < > 24* 39* 36*  CREATININE 0.81   < > 1.01* 1.04* 1.12*  CALCIUM 8.4   < > 7.9* 8.1* 8.4*  GFRNONAA  --    < > >60 >60 55*   PROT 6.0   < > 4.8* 5.1* 4.9*  ALBUMIN 3.0*   < > 2.4* 2.3* 2.9*  AST 36   < > 42* 24 22  ALT 55*   < > 62* 27 19  ALKPHOS 303*   < > 229* 245* 147*  BILITOT 0.3   < > 0.9 1.5* 1.8*  BILIDIR 0.1  --   --   --   --    < > = values in this interval not displayed.    RADIOGRAPHIC STUDIES: I have personally reviewed the radiological images as listed and agreed with the findings in the report. US  Paracentesis Result Date: 04/12/2024 INDICATION: Patient with history of pancreatic cancer with recurrent malignant ascites; request received for diagnostic and therapeutic paracentesis. EXAM: ULTRASOUND GUIDED DIAGNOSTIC AND THERAPEUTIC PARACENTESIS MEDICATIONS: 8 mL 1% lidocaine  with epinephrine  to skin/subcutaneous tissue COMPLICATIONS: None immediate. PROCEDURE: Informed written consent was obtained from the patient after a discussion of the risks, benefits and alternatives to treatment. A timeout was performed prior to the initiation of the procedure. Initial ultrasound scanning demonstrates a large amount of ascites within the right lower abdominal quadrant. The right lower abdomen was prepped and draped in the usual sterile fashion. 1% lidocaine  with epinephrine  was used for local anesthesia. Following this, a 19 gauge, 10-cm, Yueh catheter was introduced. An ultrasound image was saved for documentation purposes. The paracentesis was performed. The catheter was removed and a dressing was applied. The patient tolerated the procedure well without immediate post procedural complication. FINDINGS: A total of approximately 4.2 liters of slightly hazy, yellow fluid was removed. Samples were sent to the laboratory as requested by the clinical team. IMPRESSION: Successful ultrasound-guided diagnostic and therapeutic paracentesis yielding 4.2 liters of peritoneal fluid. Performed by: Franky Rakers, PA-C Electronically Signed   By: JONETTA Johann HERO.D.  On: 04/12/2024 00:13   CT ABDOMEN PELVIS W CONTRAST Result Date:  04/11/2024 EXAM: CT ABDOMEN AND PELVIS WITH CONTRAST 04/11/2024 11:35:03 AM TECHNIQUE: CT of the abdomen and pelvis was performed with the administration of 100 mL of iohexol  (OMNIPAQUE ) 300 MG/ML solution. Multiplanar reformatted images are provided for review. Automated exposure control, iterative reconstruction, and/or weight-based adjustment of the mA/kV was utilized to reduce the radiation dose to as low as reasonably achievable. COMPARISON: 03/15/2024 CLINICAL HISTORY: Abdominal pain, acute, nonlocalized. FINDINGS: LOWER CHEST: No acute abnormality. LIVER: Multiple rounded low densities are noted in the posterior segment of the right hepatic lobe which were not significantly changed compared to prior exam consistent with metastatic disease. Stable left hepatic lobe density is also noted concerning for metastatic disease. Left hepatic pneumobilia is again noted. GALLBLADDER AND BILE DUCTS: Gallbladder is unremarkable. No biliary ductal dilatation. SPLEEN: Mild splenomegaly is noted. PANCREAS: Continued presence of infiltrative pancreatic head mass superior mesenteric and portal vein and probably celiac axis as noted on prior exam. ADRENAL GLANDS: No acute abnormality. KIDNEYS, URETERS AND BLADDER: No stones in the kidneys or ureters. No hydronephrosis. No perinephric or periureteral stranding. Urinary bladder is unremarkable. GI AND BOWEL: Stomach demonstrates no acute abnormality. Wall thickening of the right and transverse colon is noted which may represent edema or possibly infectious or inflammatory colitis. There is no bowel obstruction. PERITONEUM AND RETROPERITONEUM: Moderate ascites is noted. No free air. VASCULATURE: Aorta is normal in caliber. LYMPH NODES: No lymphadenopathy. REPRODUCTIVE ORGANS: No acute abnormality. BONES AND SOFT TISSUES: No acute osseous abnormality. No focal soft tissue abnormality. IMPRESSION: 1. Stable hepatic metastases in the right posterior segment and left hepatic lobe  density consistent with metastasis. 2. Infiltrative pancreatic head mass with involvement of the superior mesenteric vein, portal vein, and probable celiac axis, unchanged. 3. Right and transverse colonic wall thickening, which may represent edema or infectious/inflammatory colitis. 4. Mild splenomegaly and moderate ascites. Electronically signed by: Lynwood Seip MD 04/11/2024 12:05 PM EST RP Workstation: [id number]   IR Paracentesis Result Date: 03/29/2024 INDICATION: Patient with a history of pancreatic cancer with recurrent ascites. Interventional Radiology asked to perform a therapeutic paracentesis with 4 L max EXAM: ULTRASOUND GUIDED PARACENTESIS MEDICATIONS: 1% lidocaine  10 ml COMPLICATIONS: None immediate. PROCEDURE: Informed written consent was obtained from the patient after a discussion of the risks, benefits and alternatives to treatment. A timeout was performed prior to the initiation of the procedure. Initial ultrasound scanning demonstrates a large amount of ascites within the left lower abdominal quadrant. The left lower abdomen was prepped and draped in the usual sterile fashion. 1% lidocaine  was used for local anesthesia. Following this, a 19 gauge, 7-cm, Yueh catheter was introduced. An ultrasound image was saved for documentation purposes. The paracentesis was performed. The catheter was removed and a dressing was applied. The patient tolerated the procedure well without immediate post procedural complication. Patient received post-procedure intravenous albumin; see nursing notes for details. FINDINGS: A total of approximately 4 L of clear yellow fluid was removed. IMPRESSION: Successful ultrasound-guided paracentesis yielding 4 liters of peritoneal fluid. Procedure performed by Warren Dais, NP Electronically Signed   By: Ester Sides M.D.   On: 03/29/2024 09:52   MR LIVER W WO CONTRAST Result Date: 03/18/2024 EXAM: MRI ABDOMEN 03/17/2024 05:49:47 PM TECHNIQUE: Multiplanar  multisequence MRI of the abdomen was performed with and without the administration of intravenous contrast. COMPARISON: 03/15/2024 and 10/08/2023. CLINICAL HISTORY: Liver lesion, > 1cm. History of metastatic pancreas adenocarcinoma with malignant ascites and  new right lobe of liver lesions. FINDINGS: LIVER: Within the posterior right lobe of the liver there is a cluster of multiple cystic lesions which appear new compared to 10/08/2023. The largest of these measures 2.8 x 2.3 cm. These are all T2 hyperintense with peripheral rim enhancement. On the adc map these all exhibit central increased signal with peripheral low signal. On the diffusion-weighted sequences there is peripheral restricted diffusion . In the setting of a known malignancy, these are favored to represent necrotic or cystic metastases. Within segment 4a there is a peripherally enhancing lesion measuring 1.5 x 0.8 cm with this shows restricted diffusion and is concerning for metastatic disease. Within segment 4b, there is a cystic lesion which measures 2.4 x 1.6 cm (image 44/13). This shows mild peripheral rim enhancement with central restricted diffusion. On the previous exam, this measured 3.3 x 2.8 cm. Also concerning for metastatic disease. SABRA GALLBLADDER AND BILIARY SYSTEM: The gallbladder appears surgically absent. Status post cholecystectomy. Common bile duct stent is in place. No significant intrahepatic bile duct dilatation. SPLEEN: Unchanged splenomegaly. PANCREAS: The poorly defined infiltrative mass involving the head of the pancreas is difficult to measure given the infiltrative appearance. Similar appearance of soft tissue encasement of the celiac artery, superior mesenteric artery and the portal vein / portal venous confluence. ADRENAL GLANDS: Normal size and morphology bilaterally. No nodule, thickening, or hemorrhage. No periadrenal stranding. KIDNEYS: Unremarkable. LYMPH NODES: No lymphadenopathy. VASCULATURE: Near complete occlusion  of the portal venous confluence with distal reconstitution of the SMV and splenic vein. Intrahepatic portal vein remains patent as well as the portal vein proximal to the confluence. Increased upper abdominal varicosities are noted, likely reflecting portal venous hypertension. PERITONEUM: Moderate to large volume of ascites similar to 03/15/2024. BOWEL: Moderate stool burden noted throughout the colon. No dilated large or small bowel loops. Similar appearance of thickening of the antropyloric junction. ABDOMINAL WALL: No acute abnormality. BONES: No acute abnormality. LUNGS: Trace right pleural effusion. IMPRESSION: 1. Cluster of multiple cystic lesions in the posterior right hepatic lobe, favored necrotic or cystic metastases in the setting of known malignancy. 2. Peripherally enhancing lesion in segment 4a, suspicious for new liver metastasis. 3. Cystic lesion in segment 4b, decreased in size compared to prior exam. 4. Poorly defined infiltrative mass involving the pancreatic head with similar soft tissue encasement of the celiac artery, superior mesenteric artery, and portal venous confluence. 5. Near-complete occlusion of the portal venous confluence with distal reconstitution of the SMV and splenic vein; intrahepatic portal vein and portal vein proximal to the confluence remain patent. 6. Signs of portal venous hypertension with ascites, splenomegaly and increased upper abdominal varicosities. Electronically signed by: Waddell Calk MD 03/18/2024 05:22 AM EDT RP Workstation: [id number]   US  ABDOMEN LIMITED WITH LIVER DOPPLER Result Date: 03/15/2024 CLINICAL DATA:  Pancreatic head mass EXAM: DUPLEX ULTRASOUND OF LIVER TECHNIQUE: Color and duplex Doppler ultrasound was performed to evaluate the hepatic in-flow and out-flow vessels. COMPARISON:  CT from earlier in the same day. FINDINGS: Liver: The known mass lesion adjacent to the gallbladder fossa is not well appreciated on this exam. Main Portal Vein size:  1.3 cm Portal Vein Velocities Main Prox:  65 cm/sec Right: 25 cm/sec Left: 18 cm/sec Hepatic Vein Velocities Right:  20 cm/sec Middle:  16 cm/sec Left:  28 cm/sec IVC: Present and patent with normal respiratory phasicity. Hepatic Artery Velocity:  51.9 cm/sec Splenic Vein Velocity:  14.7 cm/sec Spleen: 17.7 cm x 0.0 cm x 6.1 cm with a total  volume of 395.9 cm^3 (411 cm^3 is upper limit normal) Portal Vein Occlusion/Thrombus: Absent Splenic Vein Occlusion/Thrombus: Absent Ascites: Present Varices: None IMPRESSION: No portal/hepatic venous occlusion is noted. No arterial abnormality is seen. The known pancreatic head mass is not as well appreciated as on recent CT. Known liver metastatic lesion is not well appreciated on this exam. Ascites is seen similar to that noted on prior CT. Spleen is at the upper limits of normal in size. Electronically Signed   By: Oneil Devonshire M.D.   On: 03/15/2024 19:51   CT ABDOMEN PELVIS W CONTRAST Result Date: 03/15/2024 CLINICAL DATA:  Recent paracentesis history of pancreas cancer worsening abdominal pain EXAM: CT ABDOMEN AND PELVIS WITH CONTRAST TECHNIQUE: Multidetector CT imaging of the abdomen and pelvis was performed using the standard protocol following bolus administration of intravenous contrast. RADIATION DOSE REDUCTION: This exam was performed according to the departmental dose-optimization program which includes automated exposure control, adjustment of the mA and/or kV according to patient size and/or use of iterative reconstruction technique. CONTRAST:  OMNIPAQUE  IOHEXOL  300 MG/ML  SOLN COMPARISON:  CT 10/08/2023, 04/02/2023, 01/11/2023, MRI 12/16/2022 FINDINGS: Lower chest: Lung bases demonstrate no acute airspace disease. Vascular catheter tip at the cavoatrial junction. Hepatobiliary: Trace pneumobilia. Indwelling common duct stent with some gas in the stent at the porta hepatis but mostly opacified appearance of the stent compared to the prior CT from May.  Slight interval decrease in size of a hypodense low-density mass near the gallbladder fossa, today measuring 19 mm on series 2, image 24, previously 4.5 cm. Contracted gallbladder without calcified stone. Interim development of multiple cystic appearing lesions in the posterior right hepatic lobe, largest measuring up to 2.6 cm on series 2, image 19. Focal wedge-shaped hypo density within the posterior right hepatic lobe extending to the subcapsular surface, series 2, image 17. Pancreas: Poorly defined infiltrative proximal pancreatic mass as was seen on prior exams, difficult to measure given infiltrative appearance. Vascular involvement as was seen on prior, involving the proximal SMA, celiac bifurcation and portal splenic confluence. Intra hepatic portal vessels are patent. Spleen: Enlarged, craniocaudal measurement of 19 cm. Adrenals/Urinary Tract: Adrenal glands are normal. Kidneys show no significant hydronephrosis. The bladder is distended Stomach/Bowel: Abnormal masslike thickening of the pylorus and duodenal bulb, series 2, image 25 through 27. Fluid-filled loops of small bowel with mucosal enhancement but no obstructive features. Colon wall thickening, mucosal enhancement and indistinct appearance of the ascending, transverse, descending and proximal sigmoid colon. Moderate stool in the rectosigmoid colon. Vascular/Lymphatic: Mild atherosclerosis.  No aneurysm. Reproductive: Uterus unremarkable.  No adnexal mass Other: No free air. Moderate volume ascites within the abdomen and pelvis. Generalized subcutaneous edema. Musculoskeletal: Multilevel degenerative change. No acute osseous abnormality. IMPRESSION: 1. Poorly defined infiltrative proximal pancreatic mass with vascular involvement as was seen on prior exams and corresponding to the history of malignancy. 2. Decreased size of previously noted thick walled cystic lesion in the right hepatic lobe near the porta hepatis however interim development of  multiple cystic appearing lesions in the posterior right hepatic lobe, largest measuring up to 2.6 cm. Findings could be secondary to intra hepatic abscess versus metastatic disease. Somewhat wedge-shaped area of hypoattenuation adjacent to the multi cystic changes in the right hepatic lobe which may be due to altered perfusion. 3. Indwelling common duct stent with some gas in the stent at the porta hepatis but mostly opacified appearance of the stent compared to the prior CT from May. Some pneumobilia is present. 4.  Abnormal masslike thickening of the pylorus and duodenal bulb, this could be inflammatory but infiltrative process/neoplastic involvement is also a consideration. 5. Fluid-filled loops of small bowel with mucosal enhancement but no obstructive features. Colon wall thickening, mucosal enhancement and indistinct appearance of the ascending, transverse, descending and proximal sigmoid colon. Findings are suspicious for enterocolitis. 6. Moderate volume ascites within the abdomen and pelvis. Generalized subcutaneous edema. 7. Splenomegaly. 8. Aortic atherosclerosis. Aortic Atherosclerosis (ICD10-I70.0). Electronically Signed   By: Luke Bun M.D.   On: 03/15/2024 16:24   DG Chest Portable 1 View Result Date: 03/15/2024 EXAM: 1 VIEW(S) XRAY OF THE CHEST 03/15/2024 01:40:46 PM COMPARISON: CT chest 01/11/2023 CLINICAL HISTORY: gen weakness, h/o cancer. General weakness, hx cancer FINDINGS: LINES, TUBES AND DEVICES: Right CT Port-A-Cath in place with tip at cavoatrial junction. LUNGS AND PLEURA: Low lung volumes. Elevated right hemidiaphragm. No focal pulmonary opacity. No pulmonary edema. No pleural effusion. No pneumothorax. HEART AND MEDIASTINUM: No acute abnormality of the cardiac and mediastinal silhouettes. BONES AND SOFT TISSUES: No acute osseous abnormality. IMPRESSION: 1. No acute cardiopulmonary findings. 2. Low lung volumes with mildly elevated right hemidiaphragm. Electronically signed by:  Waddell Calk MD 03/15/2024 02:17 PM EDT RP Workstation: [id number]   US  Paracentesis Result Date: 03/14/2024 INDICATION: ASCITES Patient with history of metastatic pancreatic cancer with recurrent malignant ascites. Request received for therapeutic paracentesis. EXAM: ULTRASOUND GUIDED THERAPEUTIC PARACENTESIS MEDICATIONS: 8 mL 1% lidocaine  with epinephrine  to skin/subcutaneous tissue COMPLICATIONS: None immediate. PROCEDURE: Informed written consent was obtained from the patient after a discussion of the risks, benefits and alternatives to treatment. A timeout was performed prior to the initiation of the procedure. Initial ultrasound scanning demonstrates a moderate amount of ascites within the left mid to lower abdominal quadrant. The left mid to lower abdomen was prepped and draped in the usual sterile fashion. 1% lidocaine  was used for local anesthesia. Following this, a 19 gauge, 10-cm, Yueh catheter was introduced. An ultrasound image was saved for documentation purposes. The paracentesis was performed. The catheter was removed and a dressing was applied. The patient tolerated the procedure well without immediate post procedural complication. FINDINGS: A total of approximately 3.0 L of yellow fluid was removed. IMPRESSION: Successful ultrasound-guided therapeutic paracentesis yielding 3.0 L of peritoneal fluid. Performed by: Franky Rakers , PA-C under direct supervision of Thom Hall, MD Electronically Signed   By: Thom Hall M.D.   On: 03/14/2024 16:39     The total time spent in the appointment was 55 minutes encounter with patients including review of chart and various tests results, discussions about plan of care and coordination of care plan   All questions were answered. The patient knows to call the clinic with any problems, questions or concerns. No barriers to learning was detected.  Nicole PARAS Rouson, NP 11/20/20259:41 AM

## 2024-04-12 NOTE — Progress Notes (Signed)
   04/12/24 1214  Vitals  Temp 98.2 F (36.8 C)  Temp Source Oral  BP 94/60  MAP (mmHg) 71  BP Location Right Arm  BP Method Automatic  Patient Position (if appropriate) Lying  Pulse Rate (!) 103  Pulse Rate Source Monitor  Resp (!) 24  MEWS COLOR  MEWS Score Color Yellow  Oxygen Therapy  SpO2 93 %  MEWS Score  MEWS Temp 0  MEWS Systolic 1  MEWS Pulse 1  MEWS RR 1  MEWS LOC 0  MEWS Score 3

## 2024-04-13 DIAGNOSIS — C250 Malignant neoplasm of head of pancreas: Secondary | ICD-10-CM | POA: Diagnosis not present

## 2024-04-13 DIAGNOSIS — D61811 Other drug-induced pancytopenia: Secondary | ICD-10-CM | POA: Diagnosis not present

## 2024-04-13 DIAGNOSIS — D709 Neutropenia, unspecified: Secondary | ICD-10-CM | POA: Diagnosis not present

## 2024-04-13 DIAGNOSIS — K529 Noninfective gastroenteritis and colitis, unspecified: Secondary | ICD-10-CM | POA: Diagnosis not present

## 2024-04-13 DIAGNOSIS — D649 Anemia, unspecified: Secondary | ICD-10-CM

## 2024-04-13 DIAGNOSIS — C787 Secondary malignant neoplasm of liver and intrahepatic bile duct: Secondary | ICD-10-CM | POA: Diagnosis not present

## 2024-04-13 LAB — BASIC METABOLIC PANEL WITH GFR
Anion gap: 7 (ref 5–15)
BUN: 35 mg/dL — ABNORMAL HIGH (ref 8–23)
CO2: 22 mmol/L (ref 22–32)
Calcium: 7.7 mg/dL — ABNORMAL LOW (ref 8.9–10.3)
Chloride: 99 mmol/L (ref 98–111)
Creatinine, Ser: 1.14 mg/dL — ABNORMAL HIGH (ref 0.44–1.00)
GFR, Estimated: 54 mL/min — ABNORMAL LOW (ref >60)
Glucose, Bld: 111 mg/dL — ABNORMAL HIGH (ref 70–99)
Potassium: 4.3 mmol/L (ref 3.5–5.1)
Sodium: 129 mmol/L — ABNORMAL LOW (ref 135–145)

## 2024-04-13 LAB — BPAM PLATELET PHERESIS
Blood Product Expiration Date: 202511232359
ISSUE DATE / TIME: 202511201703
Unit Type and Rh: 6200

## 2024-04-13 LAB — TYPE AND SCREEN
ABO/RH(D): O POS
Antibody Screen: NEGATIVE
Unit division: 0
Unit division: 0

## 2024-04-13 LAB — PREPARE PLATELET PHERESIS: Unit division: 0

## 2024-04-13 LAB — CBC
HCT: 22.4 % — ABNORMAL LOW (ref 36.0–46.0)
Hemoglobin: 7.3 g/dL — ABNORMAL LOW (ref 12.0–15.0)
MCH: 28.9 pg (ref 26.0–34.0)
MCHC: 32.6 g/dL (ref 30.0–36.0)
MCV: 88.5 fL (ref 80.0–100.0)
Platelets: 12 K/uL — CL (ref 150–400)
RBC: 2.53 MIL/uL — ABNORMAL LOW (ref 3.87–5.11)
RDW: 20.5 % — ABNORMAL HIGH (ref 11.5–15.5)
WBC: 5.2 K/uL (ref 4.0–10.5)
nRBC: 0 % (ref 0.0–0.2)

## 2024-04-13 LAB — BPAM RBC
Blood Product Expiration Date: 202512222359
Blood Product Expiration Date: 202512222359
ISSUE DATE / TIME: 202511201308
ISSUE DATE / TIME: 202511201452
Unit Type and Rh: 5100
Unit Type and Rh: 5100

## 2024-04-13 LAB — PROTIME-INR
INR: 1.7 — ABNORMAL HIGH (ref 0.8–1.2)
Prothrombin Time: 20.8 s — ABNORMAL HIGH (ref 11.4–15.2)

## 2024-04-13 MED ORDER — SODIUM CHLORIDE 0.9% IV SOLUTION: Freq: Once | INTRAVENOUS | Status: AC

## 2024-04-13 MED ORDER — OXYCODONE HCL 5 MG PO TABS
5.0000 mg | ORAL_TABLET | ORAL | Status: DC | PRN
  Filled 2024-04-13: qty 1

## 2024-04-13 MED ORDER — MORPHINE SULFATE (PF) 2 MG/ML IV SOLN
2.0000 mg | INTRAVENOUS | Status: DC | PRN
  Administered 2024-04-13 – 2024-04-17 (×14): 2 mg via INTRAVENOUS
  Filled 2024-04-13 (×14): qty 1

## 2024-04-13 MED ORDER — ROMIPLOSTIM 125 MCG ~~LOC~~ SOLR
1.0000 ug/kg | Freq: Once | SUBCUTANEOUS | Status: AC
  Administered 2024-04-13: 65 ug via SUBCUTANEOUS
  Filled 2024-04-13: qty 0.13

## 2024-04-13 NOTE — Progress Notes (Signed)
 [facility] Progress Note  Patient: Nicole Johnson     FMW:[id number]  DOA: 04/11/2024   PCP: Nicole Laine DELENA, MD       Brief hospital course: 63 y.o. female with medical history significant of metastatic pancreatic adenocarcinoma to liver, pancytopenia, cirrhosis with ascites, GERD, presented with worsening of abdominal pain.  Subjective:  No complaints-   Assessment and Plan: Principal Problem: Abdominal pain, large ascites - The patient states that her main complaint was lower abdominal pain-she does not admit to any nausea vomiting or diarrhea - Abdominal pain has improved after her paracentesis-4 L of fluid removed - The patient states that she does get regular paracentesis performed as outpatient -Ascitic fluid shows white blood cell count of 1610-84% of which are neutrophils-Gram stain is negative for organisms  - She states her oncologist feels that ascites is secondary to underlying cancer  Fever - Temperature 101.1 yesterday - Continue to follow for fevers and will order further workup if she continues to be febrile - If no fever overnight, hopefully can discharge tomorrow  Anemia and thrombocytopenia - No signs of bleeding - She regularly receives platelet transfusions as outpatient and occasionally receives PRBCs - Typically her dropping blood counts are secondary to chemotherapy - She will receive 1 unit of platelets today for a platelet count of 12 - Have also spoken with her hematologist, Dr. Rosy who is recommended 1 mg /Kg of Nplate  which I have ordered - Hemoglobin was 6.1 yesterday but improved to 7.3 today-will hold off on blood transfusion  Pancreatic adenocarcinoma - She receives chemotherapy-is managed at [facility]  Elevated INR-  - INR 1.9 - Vitamin K 5 mg given on 11/20 - INR 1.7 today   Cirrhosis of the liver    Code Status: Full Code Total time on patient care: 35 minutes DVT prophylaxis:  Place TED hose Start: 04/11/24 1301      Objective:   Vitals:   04/13/24 0959 04/13/24 1014 04/13/24 1014 04/13/24 1334  BP: 96/60 (!) 95/59 (!) 95/59 (!) 99/55  Pulse: 96 95 94 94  Resp: 15 16 16 16   Temp: 98.1 F (36.7 C) 98.3 F (36.8 C) 98.3 F (36.8 C) 98.2 F (36.8 C)  TempSrc: Oral   Oral  SpO2:   94% 97%  Weight:      Height:       Filed Weights   04/11/24 1744  Weight: 62.7 kg   Exam: General exam: Appears comfortable  HEENT: oral mucosa moist Respiratory system: Clear to auscultation.  Cardiovascular system: S1 & S2 heard  Gastrointestinal system: Abdomen soft, non-tender, nondistended. Normal bowel sounds   Extremities: No cyanosis, clubbing-mild bilateral edema Psychiatry:  Mood & affect appropriate.      CBC: Recent Labs  Lab 04/11/24 0924 04/12/24 0813 04/13/24 0020  WBC 9.8 5.2 5.2  HGB 8.0* 6.1* 7.3*  HCT 25.2* 18.6* 22.4*  MCV 87.2 88.2 88.5  PLT 25* 16* 12*   Basic Metabolic Panel: Recent Labs  Lab 04/11/24 0924 04/12/24 0813 04/13/24 0020  NA 128* 131* 129*  K 4.2 4.6 4.3  CL 99 100 99  CO2 21* 22 22  GLUCOSE 132* 96 111*  BUN 39* 36* 35*  CREATININE 1.04* 1.12* 1.14*  CALCIUM 8.1* 8.4* 7.7*     Scheduled Meds:  Chlorhexidine  Gluconate Cloth  6 each Topical Q0600   midodrine   10 mg Oral TID WC   omeprazole   20 mg Oral BID AC   sodium chloride  flush  10-40 mL Intracatheter Q12H    Imaging and lab data personally reviewed   Author: [employer]  04/13/2024 1:55 PM  To contact [facility]>   Check the care team in [facility] and look for the attending/consulting [facility] provider listed  Log into [email or web address] and use [place] universal password   Go to> [facility]  and find provider  If you still have difficulty reaching the provider, please page the [facility] (Director on Call) for the Hospitalists listed on amion

## 2024-04-13 NOTE — Progress Notes (Addendum)
 ADDENDUM:  Patient was personally and independently interviewed, examined and relevant elements of the history of present illness were reviewed in details and an assessment and plan was created. All elements of the patient's history of present illness, assessment and plan were discussed in detail with Olam JINNY Brunner, NP. The above documentation reflects our combined findings assessment and plan.   Briefly, 63 year old lady with pancreatic cancer with liver mets, currently being followed at [facility] by Dr. Donato, receiving chemotherapy with gemcitabine and Abraxane, last received gemcitabine alone on 04/04/2024.  She got admitted to the hospital yesterday after she presented with abdominal pain, nausea, vomiting, diarrhea and was found to have severe anemia and thrombocytopenia.  No neutropenia.   Apparently she has been getting platelet transfusions/Nplate  injection once a week at [facility] for chronic thrombocytopenia.  Platelet count was still low at 12,000 today.  She was given 1 dose of Nplate  today.   Continue to monitor CBCD daily and transfuse as needed to maintain platelet count above 20,000 and hemoglobin above 7 at least.   She will continue to follow-up with Dr. Donato at [facility], once discharged.  Please call with any questions.  No additional recommendations from our standpoint at this time   Nicole Johnson   DOB:1961/04/16   FM#:[id number]      ASSESSMENT & PLAN:  Nicole Johnson 63 y.o. female with history significant for pancreatic cancer which is treated at [facility].  Patient was admitted 04/11/2024 from home due to complaints of abdominal pain, nausea, vomiting, and diarrhea. Medical Oncology following.   Pancreatic adenocarcinoma with liver mets - Initially diagnosed 01/06/2023 as a stage Ib. - Patient was seen at [facility] by Dr. Lanny however transferred her care to [facility] on 01/27/2023.  Mass was deemed unresectable therefore she started chemotherapy.  Status post FOLFOX  chemotherapy regimen.  Due to cytopenias treatment plan was changed to radiation with Xeloda.   -- More recently was started on gemcitabine/nab-paclitaxel.  She received gemcitabine alone on 04/04/2024. - CA 19-9 levels being trended by primary oncologist, last level 197. - Patient currently treated at [facility] oncologist Dr. Donato.   -- Medical oncology/Dr. Kristee Angus following during this hospitalization and will make further recommendations.   Pancytopenia - Secondary to recent oncologic therapy + malignancy Leukopenia/neutropenia - WBC low - States that she receives G CSF and most recently received last week after chemotherapy treatment.  However noted on [facility] documentation 04/04/2024 that G-CSF was held. Anemia - Hemoglobin 7.3 - Recommend PRBC transfusion for hemoglobin <7.0.  One unit PRBC transfused 04/12/24.  - Status post IV iron given at [facility] hematologist office 02/22/2024. Thrombocytopenia - Platelets 12K despite receiving one unit platelets 11/20.  Platelets seen transfusing today during assessment. - Recommend platelet transfusion for counts <20 K or <50 K with bleeding.  One unit platelets ordered for transfusion today - Previously prescribed Promacta  75 mg p.o. daily - States that she has been on injection every week for her platelets.  This is confirmed in [facility] documentation 04/04/2024.  Nplate  65 mcg ordered x1 dose to be given today.  - Continue to monitor CBC with differential closely   Ascites - Secondary to malignancy - Status post paracentesis 03/29/2024 with 4 L removed. Repeat para done 11/19 with 4.2 liters slightly hazy yellow fluid removed.     Code Status Full   Subjective:  Patient awake and alert. Reports feeling a little better although still feeling weak. Platelets transfusing well. States her hematologist at [facility] spoke  with hospital   No new acute symptoms noted.  Objective:   Intake/Output Summary (Last 24 hours) at 04/13/2024 9047 Last data filed  at 04/12/2024 1945 Gross per 24 hour  Intake 684.96 ml  Output --  Net 684.96 ml     PHYSICAL EXAMINATION: ECOG PERFORMANCE STATUS: 2 - Symptomatic, <50% confined to bed  Vitals:   04/13/24 0513 04/13/24 0944  BP: (!) 91/57 96/60  Pulse: 91 96  Resp: 17 15  Temp: 98 F (36.7 C) 98.1 F (36.7 C)  SpO2: 95% 97%   Filed Weights   04/11/24 1744  Weight: 138 lb 3.7 oz (62.7 kg)    GENERAL: alert, no distress and comfortable SKIN: skin color, texture, turgor are normal, no rashes or significant lesions EYES: normal, conjunctiva are pink and non-injected, sclera clear OROPHARYNX: no exudate, no erythema and lips, buccal mucosa, and tongue normal  NECK: supple, thyroid  normal size, non-tender, without nodularity LYMPH: no palpable lymphadenopathy in the cervical, axillary or inguinal LUNGS: clear to auscultation and percussion with normal breathing effort HEART: regular rate & rhythm and no murmurs and no lower extremity edema ABDOMEN: abdomen soft, non-tender and normal bowel sounds MUSCULOSKELETAL: no cyanosis of digits and no clubbing  PSYCH: alert & oriented x 3 with fluent speech NEURO: no focal motor/sensory deficits   All questions were answered. The patient knows to call the clinic with any problems, questions or concerns.   The total time spent in the appointment was 40 minutes encounter with patient including review of chart and various tests results, discussions about plan of care and coordination of care plan  Olam JINNY Brunner, NP 04/13/2024 9:52 AM    Labs Reviewed:  Lab Results  Component Value Date   WBC 5.2 04/13/2024   HGB 7.3 (L) 04/13/2024   HCT 22.4 (L) 04/13/2024   MCV 88.5 04/13/2024   PLT 12 (LL) 04/13/2024   Recent Labs    01/11/24 1112 01/18/24 1056 03/19/24 0355 04/11/24 0924 04/12/24 0813 04/13/24 0020  NA 136   < > 132* 128* 131* 129*  K 3.9   < > 4.0 4.2 4.6 4.3  CL 101   < > 101 99 100 99  CO2 31   < > 24 21* 22 22  GLUCOSE 124*    < > 123* 132* 96 111*  BUN 12   < > 24* 39* 36* 35*  CREATININE 0.81   < > 1.01* 1.04* 1.12* 1.14*  CALCIUM 8.4   < > 7.9* 8.1* 8.4* 7.7*  GFRNONAA  --    < > >60 >60 55* 54*  PROT 6.0   < > 4.8* 5.1* 4.9*  --   ALBUMIN  3.0*   < > 2.4* 2.3* 2.9*  --   AST 36   < > 42* 24 22  --   ALT 55*   < > 62* 27 19  --   ALKPHOS 303*   < > 229* 245* 147*  --   BILITOT 0.3   < > 0.9 1.5* 1.8*  --   BILIDIR 0.1  --   --   --   --   --    < > = values in this interval not displayed.    Studies Reviewed:  US  Paracentesis Result Date: 04/12/2024 INDICATION: Patient with history of pancreatic cancer with recurrent malignant ascites; request received for diagnostic and therapeutic paracentesis. EXAM: ULTRASOUND GUIDED DIAGNOSTIC AND THERAPEUTIC PARACENTESIS MEDICATIONS: 8 mL 1% lidocaine  with epinephrine  to skin/subcutaneous tissue COMPLICATIONS:  None immediate. PROCEDURE: Informed written consent was obtained from the patient after a discussion of the risks, benefits and alternatives to treatment. A timeout was performed prior to the initiation of the procedure. Initial ultrasound scanning demonstrates a large amount of ascites within the right lower abdominal quadrant. The right lower abdomen was prepped and draped in the usual sterile fashion. 1% lidocaine  with epinephrine  was used for local anesthesia. Following this, a 19 gauge, 10-cm, Yueh catheter was introduced. An ultrasound image was saved for documentation purposes. The paracentesis was performed. The catheter was removed and a dressing was applied. The patient tolerated the procedure well without immediate post procedural complication. FINDINGS: A total of approximately 4.2 liters of slightly hazy, yellow fluid was removed. Samples were sent to the laboratory as requested by the clinical team. IMPRESSION: Successful ultrasound-guided diagnostic and therapeutic paracentesis yielding 4.2 liters of peritoneal fluid. Performed by: Franky Rakers, PA-C  Electronically Signed   By: JONETTA Faes M.D.   On: 04/12/2024 00:13   CT ABDOMEN PELVIS W CONTRAST Result Date: 04/11/2024 EXAM: CT ABDOMEN AND PELVIS WITH CONTRAST 04/11/2024 11:35:03 AM TECHNIQUE: CT of the abdomen and pelvis was performed with the administration of 100 mL of iohexol  (OMNIPAQUE ) 300 MG/ML solution. Multiplanar reformatted images are provided for review. Automated exposure control, iterative reconstruction, and/or weight-based adjustment of the mA/kV was utilized to reduce the radiation dose to as low as reasonably achievable. COMPARISON: 03/15/2024 CLINICAL HISTORY: Abdominal pain, acute, nonlocalized. FINDINGS: LOWER CHEST: No acute abnormality. LIVER: Multiple rounded low densities are noted in the posterior segment of the right hepatic lobe which were not significantly changed compared to prior exam consistent with metastatic disease. Stable left hepatic lobe density is also noted concerning for metastatic disease. Left hepatic pneumobilia is again noted. GALLBLADDER AND BILE DUCTS: Gallbladder is unremarkable. No biliary ductal dilatation. SPLEEN: Mild splenomegaly is noted. PANCREAS: Continued presence of infiltrative pancreatic head mass superior mesenteric and portal vein and probably celiac axis as noted on prior exam. ADRENAL GLANDS: No acute abnormality. KIDNEYS, URETERS AND BLADDER: No stones in the kidneys or ureters. No hydronephrosis. No perinephric or periureteral stranding. Urinary bladder is unremarkable. GI AND BOWEL: Stomach demonstrates no acute abnormality. Wall thickening of the right and transverse colon is noted which may represent edema or possibly infectious or inflammatory colitis. There is no bowel obstruction. PERITONEUM AND RETROPERITONEUM: Moderate ascites is noted. No free air. VASCULATURE: Aorta is normal in caliber. LYMPH NODES: No lymphadenopathy. REPRODUCTIVE ORGANS: No acute abnormality. BONES AND SOFT TISSUES: No acute osseous abnormality. No focal soft  tissue abnormality. IMPRESSION: 1. Stable hepatic metastases in the right posterior segment and left hepatic lobe density consistent with metastasis. 2. Infiltrative pancreatic head mass with involvement of the superior mesenteric vein, portal vein, and probable celiac axis, unchanged. 3. Right and transverse colonic wall thickening, which may represent edema or infectious/inflammatory colitis. 4. Mild splenomegaly and moderate ascites. Electronically signed by: Lynwood Seip MD 04/11/2024 12:05 PM EST RP Workstation: [id number]   IR Paracentesis Result Date: 03/29/2024 INDICATION: Patient with a history of pancreatic cancer with recurrent ascites. Interventional Radiology asked to perform a therapeutic paracentesis with 4 L max EXAM: ULTRASOUND GUIDED PARACENTESIS MEDICATIONS: 1% lidocaine  10 ml COMPLICATIONS: None immediate. PROCEDURE: Informed written consent was obtained from the patient after a discussion of the risks, benefits and alternatives to treatment. A timeout was performed prior to the initiation of the procedure. Initial ultrasound scanning demonstrates a large amount of ascites within the left lower abdominal quadrant. The  left lower abdomen was prepped and draped in the usual sterile fashion. 1% lidocaine  was used for local anesthesia. Following this, a 19 gauge, 7-cm, Yueh catheter was introduced. An ultrasound image was saved for documentation purposes. The paracentesis was performed. The catheter was removed and a dressing was applied. The patient tolerated the procedure well without immediate post procedural complication. Patient received post-procedure intravenous albumin ; see nursing notes for details. FINDINGS: A total of approximately 4 L of clear yellow fluid was removed. IMPRESSION: Successful ultrasound-guided paracentesis yielding 4 liters of peritoneal fluid. Procedure performed by Warren Dais, NP Electronically Signed   By: Ester Sides M.D.   On: 03/29/2024 09:52   MR LIVER W  WO CONTRAST Result Date: 03/18/2024 EXAM: MRI ABDOMEN 03/17/2024 05:49:47 PM TECHNIQUE: Multiplanar multisequence MRI of the abdomen was performed with and without the administration of intravenous contrast. COMPARISON: 03/15/2024 and 10/08/2023. CLINICAL HISTORY: Liver lesion, > 1cm. History of metastatic pancreas adenocarcinoma with malignant ascites and new right lobe of liver lesions. FINDINGS: LIVER: Within the posterior right lobe of the liver there is a cluster of multiple cystic lesions which appear new compared to 10/08/2023. The largest of these measures 2.8 x 2.3 cm. These are all T2 hyperintense with peripheral rim enhancement. On the adc map these all exhibit central increased signal with peripheral low signal. On the diffusion-weighted sequences there is peripheral restricted diffusion . In the setting of a known malignancy, these are favored to represent necrotic or cystic metastases. Within segment 4a there is a peripherally enhancing lesion measuring 1.5 x 0.8 cm with this shows restricted diffusion and is concerning for metastatic disease. Within segment 4b, there is a cystic lesion which measures 2.4 x 1.6 cm (image 44/13). This shows mild peripheral rim enhancement with central restricted diffusion. On the previous exam, this measured 3.3 x 2.8 cm. Also concerning for metastatic disease. SABRA GALLBLADDER AND BILIARY SYSTEM: The gallbladder appears surgically absent. Status post cholecystectomy. Common bile duct stent is in place. No significant intrahepatic bile duct dilatation. SPLEEN: Unchanged splenomegaly. PANCREAS: The poorly defined infiltrative mass involving the head of the pancreas is difficult to measure given the infiltrative appearance. Similar appearance of soft tissue encasement of the celiac artery, superior mesenteric artery and the portal vein / portal venous confluence. ADRENAL GLANDS: Normal size and morphology bilaterally. No nodule, thickening, or hemorrhage. No periadrenal  stranding. KIDNEYS: Unremarkable. LYMPH NODES: No lymphadenopathy. VASCULATURE: Near complete occlusion of the portal venous confluence with distal reconstitution of the SMV and splenic vein. Intrahepatic portal vein remains patent as well as the portal vein proximal to the confluence. Increased upper abdominal varicosities are noted, likely reflecting portal venous hypertension. PERITONEUM: Moderate to large volume of ascites similar to 03/15/2024. BOWEL: Moderate stool burden noted throughout the colon. No dilated large or small bowel loops. Similar appearance of thickening of the antropyloric junction. ABDOMINAL WALL: No acute abnormality. BONES: No acute abnormality. LUNGS: Trace right pleural effusion. IMPRESSION: 1. Cluster of multiple cystic lesions in the posterior right hepatic lobe, favored necrotic or cystic metastases in the setting of known malignancy. 2. Peripherally enhancing lesion in segment 4a, suspicious for new liver metastasis. 3. Cystic lesion in segment 4b, decreased in size compared to prior exam. 4. Poorly defined infiltrative mass involving the pancreatic head with similar soft tissue encasement of the celiac artery, superior mesenteric artery, and portal venous confluence. 5. Near-complete occlusion of the portal venous confluence with distal reconstitution of the SMV and splenic vein; intrahepatic portal vein and portal  vein proximal to the confluence remain patent. 6. Signs of portal venous hypertension with ascites, splenomegaly and increased upper abdominal varicosities. Electronically signed by: Waddell Calk MD 03/18/2024 05:22 AM EDT RP Workstation: [id number]   US  ABDOMEN LIMITED WITH LIVER DOPPLER Result Date: 03/15/2024 CLINICAL DATA:  Pancreatic head mass EXAM: DUPLEX ULTRASOUND OF LIVER TECHNIQUE: Color and duplex Doppler ultrasound was performed to evaluate the hepatic in-flow and out-flow vessels. COMPARISON:  CT from earlier in the same day. FINDINGS: Liver: The known  mass lesion adjacent to the gallbladder fossa is not well appreciated on this exam. Main Portal Vein size: 1.3 cm Portal Vein Velocities Main Prox:  65 cm/sec Right: 25 cm/sec Left: 18 cm/sec Hepatic Vein Velocities Right:  20 cm/sec Middle:  16 cm/sec Left:  28 cm/sec IVC: Present and patent with normal respiratory phasicity. Hepatic Artery Velocity:  51.9 cm/sec Splenic Vein Velocity:  14.7 cm/sec Spleen: 17.7 cm x 0.0 cm x 6.1 cm with a total volume of 395.9 cm^3 (411 cm^3 is upper limit normal) Portal Vein Occlusion/Thrombus: Absent Splenic Vein Occlusion/Thrombus: Absent Ascites: Present Varices: None IMPRESSION: No portal/hepatic venous occlusion is noted. No arterial abnormality is seen. The known pancreatic head mass is not as well appreciated as on recent CT. Known liver metastatic lesion is not well appreciated on this exam. Ascites is seen similar to that noted on prior CT. Spleen is at the upper limits of normal in size. Electronically Signed   By: Oneil Devonshire M.D.   On: 03/15/2024 19:51   CT ABDOMEN PELVIS W CONTRAST Result Date: 03/15/2024 CLINICAL DATA:  Recent paracentesis history of pancreas cancer worsening abdominal pain EXAM: CT ABDOMEN AND PELVIS WITH CONTRAST TECHNIQUE: Multidetector CT imaging of the abdomen and pelvis was performed using the standard protocol following bolus administration of intravenous contrast. RADIATION DOSE REDUCTION: This exam was performed according to the departmental dose-optimization program which includes automated exposure control, adjustment of the mA and/or kV according to patient size and/or use of iterative reconstruction technique. CONTRAST:  OMNIPAQUE  IOHEXOL  300 MG/ML  SOLN COMPARISON:  CT 10/08/2023, 04/02/2023, 01/11/2023, MRI 12/16/2022 FINDINGS: Lower chest: Lung bases demonstrate no acute airspace disease. Vascular catheter tip at the cavoatrial junction. Hepatobiliary: Trace pneumobilia. Indwelling common duct stent with some gas in the  stent at the porta hepatis but mostly opacified appearance of the stent compared to the prior CT from May. Slight interval decrease in size of a hypodense low-density mass near the gallbladder fossa, today measuring 19 mm on series 2, image 24, previously 4.5 cm. Contracted gallbladder without calcified stone. Interim development of multiple cystic appearing lesions in the posterior right hepatic lobe, largest measuring up to 2.6 cm on series 2, image 19. Focal wedge-shaped hypo density within the posterior right hepatic lobe extending to the subcapsular surface, series 2, image 17. Pancreas: Poorly defined infiltrative proximal pancreatic mass as was seen on prior exams, difficult to measure given infiltrative appearance. Vascular involvement as was seen on prior, involving the proximal SMA, celiac bifurcation and portal splenic confluence. Intra hepatic portal vessels are patent. Spleen: Enlarged, craniocaudal measurement of 19 cm. Adrenals/Urinary Tract: Adrenal glands are normal. Kidneys show no significant hydronephrosis. The bladder is distended Stomach/Bowel: Abnormal masslike thickening of the pylorus and duodenal bulb, series 2, image 25 through 27. Fluid-filled loops of small bowel with mucosal enhancement but no obstructive features. Colon wall thickening, mucosal enhancement and indistinct appearance of the ascending, transverse, descending and proximal sigmoid colon. Moderate stool in the rectosigmoid colon. Vascular/Lymphatic:  Mild atherosclerosis.  No aneurysm. Reproductive: Uterus unremarkable.  No adnexal mass Other: No free air. Moderate volume ascites within the abdomen and pelvis. Generalized subcutaneous edema. Musculoskeletal: Multilevel degenerative change. No acute osseous abnormality. IMPRESSION: 1. Poorly defined infiltrative proximal pancreatic mass with vascular involvement as was seen on prior exams and corresponding to the history of malignancy. 2. Decreased size of previously noted  thick walled cystic lesion in the right hepatic lobe near the porta hepatis however interim development of multiple cystic appearing lesions in the posterior right hepatic lobe, largest measuring up to 2.6 cm. Findings could be secondary to intra hepatic abscess versus metastatic disease. Somewhat wedge-shaped area of hypoattenuation adjacent to the multi cystic changes in the right hepatic lobe which may be due to altered perfusion. 3. Indwelling common duct stent with some gas in the stent at the porta hepatis but mostly opacified appearance of the stent compared to the prior CT from May. Some pneumobilia is present. 4. Abnormal masslike thickening of the pylorus and duodenal bulb, this could be inflammatory but infiltrative process/neoplastic involvement is also a consideration. 5. Fluid-filled loops of small bowel with mucosal enhancement but no obstructive features. Colon wall thickening, mucosal enhancement and indistinct appearance of the ascending, transverse, descending and proximal sigmoid colon. Findings are suspicious for enterocolitis. 6. Moderate volume ascites within the abdomen and pelvis. Generalized subcutaneous edema. 7. Splenomegaly. 8. Aortic atherosclerosis. Aortic Atherosclerosis (ICD10-I70.0). Electronically Signed   By: Luke Bun M.D.   On: 03/15/2024 16:24   DG Chest Portable 1 View Result Date: 03/15/2024 EXAM: 1 VIEW(S) XRAY OF THE CHEST 03/15/2024 01:40:46 PM COMPARISON: CT chest 01/11/2023 CLINICAL HISTORY: gen weakness, h/o cancer. General weakness, hx cancer FINDINGS: LINES, TUBES AND DEVICES: Right CT Port-A-Cath in place with tip at cavoatrial junction. LUNGS AND PLEURA: Low lung volumes. Elevated right hemidiaphragm. No focal pulmonary opacity. No pulmonary edema. No pleural effusion. No pneumothorax. HEART AND MEDIASTINUM: No acute abnormality of the cardiac and mediastinal silhouettes. BONES AND SOFT TISSUES: No acute osseous abnormality. IMPRESSION: 1. No acute  cardiopulmonary findings. 2. Low lung volumes with mildly elevated right hemidiaphragm. Electronically signed by: Waddell Calk MD 03/15/2024 02:17 PM EDT RP Workstation: [id number]   US  Paracentesis Result Date: 03/14/2024 INDICATION: ASCITES Patient with history of metastatic pancreatic cancer with recurrent malignant ascites. Request received for therapeutic paracentesis. EXAM: ULTRASOUND GUIDED THERAPEUTIC PARACENTESIS MEDICATIONS: 8 mL 1% lidocaine  with epinephrine  to skin/subcutaneous tissue COMPLICATIONS: None immediate. PROCEDURE: Informed written consent was obtained from the patient after a discussion of the risks, benefits and alternatives to treatment. A timeout was performed prior to the initiation of the procedure. Initial ultrasound scanning demonstrates a moderate amount of ascites within the left mid to lower abdominal quadrant. The left mid to lower abdomen was prepped and draped in the usual sterile fashion. 1% lidocaine  was used for local anesthesia. Following this, a 19 gauge, 10-cm, Yueh catheter was introduced. An ultrasound image was saved for documentation purposes. The paracentesis was performed. The catheter was removed and a dressing was applied. The patient tolerated the procedure well without immediate post procedural complication. FINDINGS: A total of approximately 3.0 L of yellow fluid was removed. IMPRESSION: Successful ultrasound-guided therapeutic paracentesis yielding 3.0 L of peritoneal fluid. Performed by: Franky Rakers , PA-C under direct supervision of Thom Hall, MD Electronically Signed   By: Thom Hall M.D.   On: 03/14/2024 16:39

## 2024-04-13 NOTE — Plan of Care (Signed)
  Problem: Education: Goal: Knowledge of General Education information will improve Description: Including pain rating scale, medication(s)/side effects and non-pharmacologic comfort measures Outcome: Progressing   Problem: Clinical Measurements: Goal: Ability to maintain clinical measurements within normal limits will improve Outcome: Progressing   Problem: Activity: Goal: Risk for activity intolerance will decrease Outcome: Progressing   Problem: Nutrition: Goal: Adequate nutrition will be maintained Outcome: Progressing   Problem: Pain Managment: Goal: General experience of comfort will improve and/or be controlled Outcome: Progressing   Problem: Skin Integrity: Goal: Risk for impaired skin integrity will decrease Outcome: Progressing

## 2024-04-13 NOTE — Plan of Care (Signed)

## 2024-04-14 ENCOUNTER — Inpatient Hospital Stay (HOSPITAL_COMMUNITY)

## 2024-04-14 DIAGNOSIS — N179 Acute kidney failure, unspecified: Secondary | ICD-10-CM | POA: Diagnosis not present

## 2024-04-14 DIAGNOSIS — K529 Noninfective gastroenteritis and colitis, unspecified: Secondary | ICD-10-CM | POA: Diagnosis not present

## 2024-04-14 DIAGNOSIS — R570 Cardiogenic shock: Secondary | ICD-10-CM

## 2024-04-14 DIAGNOSIS — I4891 Unspecified atrial fibrillation: Secondary | ICD-10-CM

## 2024-04-14 DIAGNOSIS — D696 Thrombocytopenia, unspecified: Secondary | ICD-10-CM | POA: Diagnosis not present

## 2024-04-14 LAB — COMPREHENSIVE METABOLIC PANEL WITH GFR
ALT: 24 U/L (ref 0–44)
AST: 45 U/L — ABNORMAL HIGH (ref 15–41)
Albumin: 2.3 g/dL — ABNORMAL LOW (ref 3.5–5.0)
Alkaline Phosphatase: 135 U/L — ABNORMAL HIGH (ref 38–126)
Anion gap: 10 (ref 5–15)
BUN: 38 mg/dL — ABNORMAL HIGH (ref 8–23)
CO2: 21 mmol/L — ABNORMAL LOW (ref 22–32)
Calcium: 7.9 mg/dL — ABNORMAL LOW (ref 8.9–10.3)
Chloride: 101 mmol/L (ref 98–111)
Creatinine, Ser: 1.1 mg/dL — ABNORMAL HIGH (ref 0.44–1.00)
GFR, Estimated: 57 mL/min — ABNORMAL LOW
Glucose, Bld: 139 mg/dL — ABNORMAL HIGH (ref 70–99)
Potassium: 4.2 mmol/L (ref 3.5–5.1)
Sodium: 132 mmol/L — ABNORMAL LOW (ref 135–145)
Total Bilirubin: 3.2 mg/dL — ABNORMAL HIGH (ref 0.0–1.2)
Total Protein: 4.8 g/dL — ABNORMAL LOW (ref 6.5–8.1)

## 2024-04-14 LAB — TYPE AND SCREEN
ABO/RH(D): O POS
Antibody Screen: NEGATIVE

## 2024-04-14 LAB — LACTIC ACID, PLASMA
Lactic Acid, Venous: 2.6 mmol/L (ref 0.5–1.9)
Lactic Acid, Venous: 1.8 mmol/L (ref 0.5–1.9)

## 2024-04-14 LAB — CBC
HCT: 25.5 % — ABNORMAL LOW (ref 36.0–46.0)
HCT: 25.1 % — ABNORMAL LOW (ref 36.0–46.0)
Hemoglobin: 8.3 g/dL — ABNORMAL LOW (ref 12.0–15.0)
Hemoglobin: 8.4 g/dL — ABNORMAL LOW (ref 12.0–15.0)
MCH: 28.3 pg (ref 26.0–34.0)
MCH: 29.1 pg (ref 26.0–34.0)
MCHC: 32.5 g/dL (ref 30.0–36.0)
MCHC: 33.5 g/dL (ref 30.0–36.0)
MCV: 87 fL (ref 80.0–100.0)
MCV: 86.9 fL (ref 80.0–100.0)
Platelets: 15 K/uL — CL (ref 150–400)
Platelets: 18 10*3/uL — CL (ref 150–400)
RBC: 2.93 MIL/uL — ABNORMAL LOW (ref 3.87–5.11)
RBC: 2.89 MIL/uL — ABNORMAL LOW (ref 3.87–5.11)
RDW: 20.7 % — ABNORMAL HIGH (ref 11.5–15.5)
RDW: 21.2 % — ABNORMAL HIGH (ref 11.5–15.5)
WBC: 5.1 K/uL (ref 4.0–10.5)
WBC: 7.4 10*3/uL (ref 4.0–10.5)
nRBC: 0 % (ref 0.0–0.2)
nRBC: 0 % (ref 0.0–0.2)

## 2024-04-14 LAB — GLUCOSE, CAPILLARY
Glucose-Capillary: 164 mg/dL — ABNORMAL HIGH (ref 70–99)
Glucose-Capillary: 150 mg/dL — ABNORMAL HIGH (ref 70–99)

## 2024-04-14 MED ORDER — AMIODARONE IV BOLUS ONLY 150 MG/100ML
150.0000 mg | Freq: Once | INTRAVENOUS | Status: AC
  Administered 2024-04-14: 150 mg via INTRAVENOUS

## 2024-04-14 MED ORDER — AMIODARONE HCL IN DEXTROSE 360-4.14 MG/200ML-% IV SOLN
60.0000 mg/h | INTRAVENOUS | Status: AC
  Administered 2024-04-14: 30 mg/h via INTRAVENOUS
  Administered 2024-04-15 – 2024-04-18 (×13): 60 mg/h via INTRAVENOUS
  Filled 2024-04-14 (×15): qty 200

## 2024-04-14 MED ORDER — ROCURONIUM BROMIDE 10 MG/ML (PF) SYRINGE
PREFILLED_SYRINGE | INTRAVENOUS | Status: AC
  Filled 2024-04-14: qty 10

## 2024-04-14 MED ORDER — MIDAZOLAM HCL (PF) 2 MG/2ML IJ SOLN
1.0000 mg | Freq: Once | INTRAMUSCULAR | Status: AC | PRN
  Administered 2024-04-14: 1 mg via INTRAVENOUS
  Filled 2024-04-14: qty 2

## 2024-04-14 MED ORDER — AMIODARONE HCL IN DEXTROSE 360-4.14 MG/200ML-% IV SOLN
60.0000 mg/h | INTRAVENOUS | Status: AC
  Administered 2024-04-14: 60 mg/h via INTRAVENOUS

## 2024-04-14 MED ORDER — PHENYLEPHRINE HCL-NACL 20-0.9 MG/250ML-% IV SOLN
0.0000 ug/min | INTRAVENOUS | Status: DC
  Administered 2024-04-14: 60 ug/min via INTRAVENOUS
  Administered 2024-04-15: 90 ug/min via INTRAVENOUS
  Administered 2024-04-15: 50 ug/min via INTRAVENOUS
  Administered 2024-04-15 (×2): 70 ug/min via INTRAVENOUS
  Filled 2024-04-14 (×5): qty 250

## 2024-04-14 MED ORDER — PHENYLEPHRINE HCL-NACL 20-0.9 MG/250ML-% IV SOLN
INTRAVENOUS | Status: AC
  Administered 2024-04-14: 20 ug/min via INTRAVENOUS
  Filled 2024-04-14: qty 250

## 2024-04-14 MED ORDER — SODIUM CHLORIDE 0.9 % IV SOLN
2.0000 g | INTRAVENOUS | Status: DC
  Administered 2024-04-14 – 2024-04-17 (×4): 2 g via INTRAVENOUS
  Filled 2024-04-14 (×4): qty 20

## 2024-04-14 MED ORDER — PHENYLEPHRINE 80 MCG/ML (10ML) SYRINGE FOR IV PUSH (FOR BLOOD PRESSURE SUPPORT): 80.0000 ug | PREFILLED_SYRINGE | Freq: Once | INTRAVENOUS | Status: AC | PRN

## 2024-04-14 MED ORDER — SODIUM CHLORIDE 0.9% IV SOLUTION: Freq: Once | INTRAVENOUS | Status: AC

## 2024-04-14 MED ORDER — ONDANSETRON HCL 4 MG/2ML IJ SOLN
4.0000 mg | Freq: Once | INTRAMUSCULAR | Status: AC
  Administered 2024-04-14: 4 mg via INTRAVENOUS
  Filled 2024-04-14: qty 2

## 2024-04-14 MED ORDER — ADENOSINE 6 MG/2ML IV SOLN
12.0000 mg | Freq: Once | INTRAVENOUS | Status: AC
  Administered 2024-04-14: 12 mg via INTRAVENOUS
  Filled 2024-04-14: qty 4

## 2024-04-14 MED ORDER — ETOMIDATE 2 MG/ML IV SOLN
INTRAVENOUS | Status: DC
Start: 2024-04-14 — End: 2024-04-14
  Filled 2024-04-14: qty 20

## 2024-04-14 MED ORDER — KETAMINE HCL 50 MG/5ML IJ SOSY
PREFILLED_SYRINGE | INTRAMUSCULAR | Status: AC
  Filled 2024-04-14: qty 10

## 2024-04-14 MED ORDER — AMIODARONE IV BOLUS ONLY 150 MG/100ML
INTRAVENOUS | Status: AC
  Filled 2024-04-14: qty 100

## 2024-04-14 MED ORDER — AMIODARONE HCL IN DEXTROSE 360-4.14 MG/200ML-% IV SOLN
INTRAVENOUS | Status: AC
  Filled 2024-04-14: qty 200

## 2024-04-14 MED ORDER — PHENYLEPHRINE 80 MCG/ML (10ML) SYRINGE FOR IV PUSH (FOR BLOOD PRESSURE SUPPORT)
PREFILLED_SYRINGE | INTRAVENOUS | Status: AC
  Administered 2024-04-14: 200 ug via INTRAVENOUS
  Filled 2024-04-14: qty 10

## 2024-04-14 MED ORDER — FENTANYL CITRATE (PF) 50 MCG/ML IJ SOSY
PREFILLED_SYRINGE | INTRAMUSCULAR | Status: AC
  Filled 2024-04-14: qty 2

## 2024-04-14 MED ORDER — ALBUMIN HUMAN 25 % IV SOLN
50.0000 g | Freq: Two times a day (BID) | INTRAVENOUS | Status: AC
  Administered 2024-04-14 (×2): 50 g via INTRAVENOUS
  Filled 2024-04-14 (×2): qty 200

## 2024-04-14 MED ORDER — ORAL CARE MOUTH RINSE
15.0000 mL | OROMUCOSAL | Status: DC | PRN
Start: 2024-04-14 — End: 2024-04-20

## 2024-04-14 MED ORDER — INSULIN ASPART 100 UNIT/ML IJ SOLN
0.0000 [IU] | INTRAMUSCULAR | Status: DC
  Administered 2024-04-14 – 2024-04-15 (×2): 1 [IU] via SUBCUTANEOUS
  Administered 2024-04-15: 2 [IU] via SUBCUTANEOUS
  Administered 2024-04-15 (×3): 1 [IU] via SUBCUTANEOUS
  Administered 2024-04-16: 2 [IU] via SUBCUTANEOUS
  Administered 2024-04-16 (×2): 1 [IU] via SUBCUTANEOUS
  Administered 2024-04-17: 2 [IU] via SUBCUTANEOUS
  Administered 2024-04-17: 1 [IU] via SUBCUTANEOUS
  Administered 2024-04-17: 2 [IU] via SUBCUTANEOUS
  Administered 2024-04-18 (×3): 1 [IU] via SUBCUTANEOUS
  Administered 2024-04-18 – 2024-04-19 (×6): 2 [IU] via SUBCUTANEOUS
  Administered 2024-04-19: 3 [IU] via SUBCUTANEOUS
  Administered 2024-04-19 – 2024-04-21 (×8): 2 [IU] via SUBCUTANEOUS
  Administered 2024-04-21 – 2024-04-26 (×10): 1 [IU] via SUBCUTANEOUS
  Filled 2024-04-14 (×2): qty 2
  Filled 2024-04-14: qty 1
  Filled 2024-04-14: qty 2
  Filled 2024-04-14 (×3): qty 1
  Filled 2024-04-14: qty 2
  Filled 2024-04-14: qty 1
  Filled 2024-04-14 (×4): qty 2
  Filled 2024-04-14 (×5): qty 1
  Filled 2024-04-14 (×7): qty 2
  Filled 2024-04-14 (×3): qty 1
  Filled 2024-04-14: qty 2
  Filled 2024-04-14: qty 1
  Filled 2024-04-14: qty 2
  Filled 2024-04-14: qty 1
  Filled 2024-04-14 (×3): qty 2
  Filled 2024-04-14: qty 1
  Filled 2024-04-14: qty 2
  Filled 2024-04-14 (×3): qty 1

## 2024-04-14 MED ORDER — FENTANYL CITRATE (PF) 100 MCG/2ML IJ SOLN
INTRAMUSCULAR | Status: AC
  Filled 2024-04-14: qty 2

## 2024-04-14 MED ORDER — ADENOSINE 6 MG/2ML IV SOLN
6.0000 mg | Freq: Once | INTRAVENOUS | Status: AC
  Administered 2024-04-14: 6 mg via INTRAVENOUS

## 2024-04-14 MED ORDER — NOREPINEPHRINE 4 MG/250ML-% IV SOLN
INTRAVENOUS | Status: AC
  Filled 2024-04-14: qty 250

## 2024-04-14 MED ORDER — SODIUM BICARBONATE 8.4 % IV SOLN
INTRAVENOUS | Status: AC
  Filled 2024-04-14: qty 50

## 2024-04-14 MED ORDER — MIDAZOLAM HCL 2 MG/2ML IJ SOLN
INTRAMUSCULAR | Status: AC
  Filled 2024-04-14: qty 2

## 2024-04-14 MED ORDER — FENTANYL CITRATE (PF) 50 MCG/ML IJ SOSY
50.0000 ug | PREFILLED_SYRINGE | Freq: Once | INTRAMUSCULAR | Status: DC | PRN
  Filled 2024-04-14: qty 1

## 2024-04-14 MED ORDER — SUCCINYLCHOLINE CHLORIDE 200 MG/10ML IV SOSY
PREFILLED_SYRINGE | INTRAVENOUS | Status: AC
  Filled 2024-04-14: qty 10

## 2024-04-14 NOTE — Consult Note (Signed)
 NAME:  Nicole Johnson, MRN:  [id number], DOB:  1961-03-04, LOS: 2 ADMISSION DATE:  04/11/2024, CONSULTATION DATE:  @TODAY @ REFERRING MD:  Dr. Earley, CHIEF COMPLAINT:  SVT   History of Present Illness:  62 y.o. female with medical history significant of metastatic pancreatic adenocarcinoma to liver, pancytopenia, cirrhosis with ascites, GERD, presented with worsening of abdominal pain likely due to large ascites (LVP on 11/19, drained 4.2 L, improved abdominal pain)  with hospital course c/b SVT requiring hemodynamic monitoring in ICU.  HPI: Patient had her routine chemotherapy last Wednesday and she has been feeling fine afterwards.  Friday, patient and her husband ate a roasted chicken for lunch and both became sick in the evening, their symptoms including abdominal pain nauseous vomiting, they attributed to possible food toxin from the chicken.  Husband however recovered the next day and the patient on the other hand continue to feel sick, she had 1 day of diarrhea and continued to have nauseous and vomiting and decreased appetite for last 3 to 4 days, she could only tolerate some Ensure, and became very dehydrated.  She denied any hematemesis or hematochezia.  Her abdominal pain mainly located on lower abdomen bilateral, constant since yesterday.  She denied any fever or chills.  She told me that she is scheduled to have platelet transfusion at [facility] today.   ED Course: Afebrile, borderline tachycardia blood pressure 92/60, O2 saturation 100% on room air.  CT abdomen pelvis showed no acute findings, moderate ascites.  Blood work showed INR 1.6, sodium 128 BUN 39 creatinine 1.0 glucose 132 albumin  2.3, WBC 9.8 hemoglobin 8.0 compared to baseline 8.2-9.6, platelet 25.  Pertinent  Medical History   Past Medical History:  Diagnosis Date   Arthritis    Cancer (HCC)    pancreatic   GERD (gastroesophageal reflux disease)    PONV (postoperative nausea and vomiting)    Prediabetes     Significant Hospital Events: Including procedures, antibiotic start and stop dates in addition to other pertinent events   11/19 --> admitted, large volume paracentesis 4.2 L drained 11/22 --> SVT, given adenosine  6 mg and 12 mg, did not abort, came down to ICU for DCCV.  Interim History / Subjective:  No complaints, no palpitations, no light headedness, no chest pain.  Objective    Blood pressure 104/64, pulse (!) 116, temperature 98.7 F (37.1 C), temperature source Oral, resp. rate 18, height 5' 6 (1.676 m), weight 62.7 kg, last menstrual period 01/11/2011, SpO2 94%.        Intake/Output Summary (Last 24 hours) at 04/14/2024 1125 Last data filed at 04/14/2024 9244 Gross per 24 hour  Intake 952 ml  Output 350 ml  Net 602 ml   Filed Weights   04/11/24 1744  Weight: 62.7 kg    Examination: General: thin appearing woman, no distress, pale HENT: anicteric sclera, pale conjunctivae, PERRl Lungs: CTAB, port in R upper chest area c/d/i Cardiovascular: tachycardic Abdomen: distended, + fluid wave, tender to palpation diffusely Extremities: 2+ pitting edema Neuro: Alert, oriented x 3, follows commands GU: Deferred  Resolved problem list   Assessment and Plan   #Shock: likely mixed cardiogenic due to tachyarrhythmia and septic due to SBP. #SVT: #Atrial Fibrillation with RVR: CHADS2VASC is 1. - s/p adenosine  6 mg and 12 mg - s/p DCCV x 2 200 J - Amio bolus 150 mg x 1 - Amiodarone  infusion - Holding off on anticoagulation due to low chads2vasc and severe thrombocytopenia - Phenylephrine  drip -  Albumin  supplementation - Continue midodrine  10 mg TID - FU echo results - Target MAP > 65 mmHg  #SBP vs. Secondary peritonitis: paracentesis yielded 1610 leukocytes, PMN 84% I.e.1352 PMNs. This is c/w with diagnosis of spontaneous bacterial peritonitis versus secondary peritonitis. Patient had recent hx of perforated cholecystitis that was treated with drainage. Per notes,  this has resolved. - Start Ceftriaxone  - FU peritoneal cultures  #Stress Ulcer PPx: continue PPI  #Nutrition: continue regular diet  #AKI: likely pre-renal due to shock state - Supportive care as above - Renally adjust medications - Avoid Nephrotoxins - Strict I/Os - Foley catheter  #Severe Thrombocytopenia #Normocytic Anemia > Likely due to bone marrow suppression from chemoRx. - Transfuse PLT for goal > 10 - Transfuse Hgb for goal > 7 mg/dL  GOC: Discussed goals of care with husband at bedside. I recommended against doing CPR for cardiac arrest or intubation for respiratory failure given patient has metastatic pancreatic cancer. The husband noted that he would like patient to remain full code for now despite my recommendation. Per oncology notes, the patient has pancreatic cancer that is metastatic to the liver and has had a hx of perforated cholecystitis that has resolved. Blood cultures grew Klebsiella, sensitive to Ceftriaxone .  Disposition: ICU appropriate for hemodynamic monitoring and vasopressors.  Labs   CBC: Recent Labs  Lab 04/11/24 0924 04/12/24 0813 04/13/24 0020 04/14/24 0320  WBC 9.8 5.2 5.2 5.1  HGB 8.0* 6.1* 7.3* 8.3*  HCT 25.2* 18.6* 22.4* 25.5*  MCV 87.2 88.2 88.5 87.0  PLT 25* 16* 12* 15*    Basic Metabolic Panel: Recent Labs  Lab 04/11/24 0924 04/12/24 0813 04/13/24 0020  NA 128* 131* 129*  K 4.2 4.6 4.3  CL 99 100 99  CO2 21* 22 22  GLUCOSE 132* 96 111*  BUN 39* 36* 35*  CREATININE 1.04* 1.12* 1.14*  CALCIUM 8.1* 8.4* 7.7*   GFR: Estimated Creatinine Clearance: 47.9 mL/min (A) (by C-G formula based on SCr of 1.14 mg/dL (H)). Recent Labs  Lab 04/11/24 0924 04/12/24 0813 04/13/24 0020 04/14/24 0320  WBC 9.8 5.2 5.2 5.1    Liver Function Tests: Recent Labs  Lab 04/11/24 0924 04/12/24 0813  AST 24 22  ALT 27 19  ALKPHOS 245* 147*  BILITOT 1.5* 1.8*  PROT 5.1* 4.9*  ALBUMIN  2.3* 2.9*   Recent Labs  Lab 04/11/24 0924   LIPASE <10*   Recent Labs  Lab 04/12/24 0813  AMMONIA 31    ABG    Component Value Date/Time   TCO2 23 10/08/2023 1227     Coagulation Profile: Recent Labs  Lab 04/11/24 0924 04/12/24 0813 04/13/24 0020  INR 1.6* 1.9* 1.7*    Cardiac Enzymes: No results for input(s): CKTOTAL, CKMB, CKMBINDEX, TROPONINI in the last 168 hours.  HbA1C: Hgb A1c MFr Bld  Date/Time Value Ref Range Status  03/14/2023 09:09 AM 6.2 4.6 - 6.5 % Final    Comment:    Glycemic Control Guidelines for People with Diabetes:Non Diabetic:  <6%Goal of Therapy: <7%Additional Action Suggested:  >8%   12/07/2022 08:44 AM 6.5 4.6 - 6.5 % Final    Comment:    Glycemic Control Guidelines for People with Diabetes:Non Diabetic:  <6%Goal of Therapy: <7%Additional Action Suggested:  >8%     CBG: No results for input(s): GLUCAP in the last 168 hours.  Review of Systems:   Not obtained  Past Medical History:  She,  has a past medical history of Arthritis, Cancer (HCC), GERD (gastroesophageal reflux  disease), PONV (postoperative nausea and vomiting), and Prediabetes.   Surgical History:   Past Surgical History:  Procedure Laterality Date   BILIARY BRUSHING  12/17/2022   Procedure: BILIARY BRUSHING;  Surgeon: Rollin Dover, MD;  Location: [facility];  Service: Gastroenterology;;   BILIARY STENT PLACEMENT N/A 12/17/2022   Procedure: BILIARY STENT PLACEMENT;  Surgeon: Rollin Dover, MD;  Location: [facility];  Service: Gastroenterology;  Laterality: N/A;   BILIARY STENT PLACEMENT N/A 04/05/2023   Procedure: BILIARY STENT PLACEMENT;  Surgeon: Rollin Dover, MD;  Location: [facility];  Service: Gastroenterology;  Laterality: N/A;   COLONOSCOPY N/A 10/11/2023   Procedure: COLONOSCOPY;  Surgeon: Rollin Dover, MD;  Location: [facility];  Service: Gastroenterology;  Laterality: N/A;   ERCP N/A 12/17/2022   Procedure: ENDOSCOPIC RETROGRADE CHOLANGIOPANCREATOGRAPHY (ERCP);  Surgeon: Rollin Dover,  MD;  Location: [facility];  Service: Gastroenterology;  Laterality: N/A;   ERCP N/A 04/05/2023   Procedure: ENDOSCOPIC RETROGRADE CHOLANGIOPANCREATOGRAPHY (ERCP);  Surgeon: Rollin Dover, MD;  Location: [facility];  Service: Gastroenterology;  Laterality: N/A;   ESOPHAGOGASTRODUODENOSCOPY N/A 10/11/2023   Procedure: EGD (ESOPHAGOGASTRODUODENOSCOPY);  Surgeon: Rollin Dover, MD;  Location: [facility];  Service: Gastroenterology;  Laterality: N/A;   ESOPHAGOGASTRODUODENOSCOPY (EGD) WITH PROPOFOL  N/A 12/18/2022   Procedure: ESOPHAGOGASTRODUODENOSCOPY (EGD) WITH PROPOFOL ;  Surgeon: Rollin Dover, MD;  Location: [facility];  Service: Gastroenterology;  Laterality: N/A;   ESOPHAGOGASTRODUODENOSCOPY (EGD) WITH PROPOFOL  N/A 12/30/2022   Procedure: ESOPHAGOGASTRODUODENOSCOPY (EGD) WITH PROPOFOL ;  Surgeon: Rollin Dover, MD;  Location: [facility];  Service: Gastroenterology;  Laterality: N/A;   EUS N/A 12/18/2022   Procedure: UPPER ENDOSCOPIC ULTRASOUND (EUS) LINEAR;  Surgeon: Rollin Dover, MD;  Location: [facility];  Service: Gastroenterology;  Laterality: N/A;   FINE NEEDLE ASPIRATION N/A 12/18/2022   Procedure: FINE NEEDLE ASPIRATION (FNA) LINEAR;  Surgeon: Rollin Dover, MD;  Location: [facility];  Service: Gastroenterology;  Laterality: N/A;   FINE NEEDLE ASPIRATION  12/30/2022   Procedure: FINE NEEDLE ASPIRATION (FNA) LINEAR;  Surgeon: Rollin Dover, MD;  Location: [facility];  Service: Gastroenterology;;   IR PARACENTESIS  01/17/2024   IR PARACENTESIS  02/15/2024   IR PARACENTESIS  03/29/2024   LAPAROSCOPIC ABDOMINAL EXPLORATION     SPHINCTEROTOMY  12/17/2022   Procedure: ANNETT;  Surgeon: Rollin Dover, MD;  Location: [facility];  Service: Gastroenterology;;   CLEDA REMOVAL  04/05/2023   Procedure: STENT REMOVAL;  Surgeon: Rollin Dover, MD;  Location: [facility];  Service: Gastroenterology;;   UPPER ESOPHAGEAL ENDOSCOPIC ULTRASOUND (EUS) N/A 12/30/2022   Procedure: UPPER  ESOPHAGEAL ENDOSCOPIC ULTRASOUND (EUS);  Surgeon: Rollin Dover, MD;  Location: [facility];  Service: Gastroenterology;  Laterality: N/A;     Social History:   reports that she has never smoked. She has never used smokeless tobacco. She reports that she does not currently use alcohol . She reports that she does not use drugs.   Family History:  Her family history includes Breast cancer in her maternal grandmother; Breast cancer (age of onset: 32) in her sister; Cancer (age of onset: 56) in her brother; Diabetes in her brother, father, mother, and sister; Heart disease in her maternal grandfather and maternal grandmother; Lung cancer (age of onset: 36) in her father; Stroke in her mother.   Allergies Allergies  Allergen Reactions   Other Nausea Only and Other (See Comments)    Certain types of Anesthesia cause SEVERE NAUSEA   Codeine Nausea Only   Hydrocodone -Acetaminophen  Nausea Only and Other (See Comments)    Reaction unconfirmed, ed/might be nausea  Meloxicam Nausea Only   Olanzapine Other (See Comments)    Made me very sleepy, but I could not sleep.   Oxycodone  Other (See Comments)    Made me loopy.   Tramadol Nausea Only     Home Medications  Prior to Admission medications   Medication Sig Start Date End Date Taking? Authorizing Provider  ADVIL  200 MG CAPS Take 400 mg by mouth every 6 (six) hours as needed (for pain).   Yes [provider]  Cyanocobalamin  (VITAMIN B-12 PO) Place 1 tablet under the tongue daily.   Yes [provider]  ELIQUIS  5 MG TABS tablet Take 2.5 mg by mouth 2 (two) times daily.   Yes [provider]  FULPHILA 6 MG/0.6ML injection Inject 12 mg into the skin See admin instructions. Inject 12 mg into the skin every other Thursday- the  day after chemotherapy   Yes [provider]  MAGNESIUM  PO Take 2 tablets by mouth See admin instructions.   Yes [provider]  midodrine  (PROAMATINE ) 10 MG tablet Take 1  tablet (10 mg total) by mouth 3 (three) times daily with meals. 03/19/24 06/17/24 Yes [place], Eric J, DO  ondansetron  (ZOFRAN ) 8 MG tablet Take 8 mg by mouth 2 (two) times daily as needed for nausea or vomiting.   Yes [provider]  PRILOSEC OTC 20 MG tablet Take 20 mg by mouth 2 (two) times daily before a meal.   Yes [provider]  prochlorperazine  (COMPAZINE ) 10 MG tablet Take 10 mg by mouth every 6 (six) hours as needed for nausea or vomiting.   Yes [provider]  spironolactone  (ALDACTONE ) 100 MG tablet Take 50 mg by mouth daily.   Yes [provider]     Due to a high probability of clinically significant, life threatening deterioration, the patient required my highest level of preparedness to intervene emergently and I personally spent this critical care time directly and personally managing the patient. This critical care time included obtaining a history; examining the patient; pulse oximetry; ordering and review of studies; arranging urgent treatment with development of a management plan; evaluation of patient's response to treatment; frequent reassessment; and, discussions with other providers.  This critical care time was performed to assess and manage the high probability of imminent, life-threatening deterioration that could result in multi-organ failure. It was exclusive of separately billable procedures and treating other patients and teaching time. Critical Care Time: 60 minutes  Paula Southerly, MD [facility]

## 2024-04-14 NOTE — Progress Notes (Signed)
 CCMD called this clinical research associate with patient sustaining HR of 200-240 MD made aware with MD at bedside EKG order. Rapid response and Resp. Therapist called.EKG obtained showing SVT Patient connected to ZOLL Patient was given Adenosine  administered by rapid response RN with MD and Resp. At bedside. Patient transferred to ICU

## 2024-04-14 NOTE — Progress Notes (Signed)
   04/14/24 0516  Assess: MEWS Score  Temp 99.2 F (37.3 C)  BP (!) 99/53  MAP (mmHg) 67  Pulse Rate (!) 110  Resp 19  Level of Consciousness Alert  SpO2 94 %  Assess: MEWS Score  MEWS Temp 0  MEWS Systolic 1  MEWS Pulse 1  MEWS RR 0  MEWS LOC 0  MEWS Score 2  MEWS Score Color Yellow  Assess: if the MEWS score is Yellow or Red  Were vital signs accurate and taken at a resting state? Yes  Does the patient meet 2 or more of the SIRS criteria? No  MEWS guidelines implemented  Yes, yellow  Treat  MEWS Interventions Considered administering scheduled or prn medications/treatments as ordered  Take Vital Signs  Increase Vital Sign Frequency  Yellow: Q2hr x1, continue Q4hrs until patient remains green for 12hrs  Escalate  MEWS: Escalate Yellow: Discuss with charge nurse and consider notifying provider and/or RRT  Notify: Charge Nurse/RN  Name of Charge Nurse/RN Notified Chartered Certified Accountant  Assess: SIRS CRITERIA  SIRS Temperature  0  SIRS Respirations  0  SIRS Pulse 1  SIRS WBC 0  SIRS Score Sum  1

## 2024-04-14 NOTE — Progress Notes (Signed)
 RT NOTE:  RT was at bedside during cardioversion. Assisted with BMV post cardioversion.

## 2024-04-14 NOTE — Consult Note (Signed)
 WOC Nurse Consult Note: Reason for Consult: DTPI sacrum  Wound type:Deep tissue Pressure Injury  sacrum purple maroon discoloration, appears to be evolving with some lifting of the epidermis noted  Pressure Injury POA: Yes Measurement: see nursing flowsheet  Wound bed: as above  Drainage (amount, consistency, odor) see nursing flowsheet  Periwound: erythema  Dressing procedure/placement/frequency: Cleanse sacral wound with Vashe, do not rinse.  Apply Xeroform gauze Soila [phone]) to wound bed daily and secure with silicone foam.    Patient should remain on a low air loss mattress when moved out of ICU setting for pressure redistribution and moisture management.   WOC team will not follow.  DEEP TISSUE PRESSURE INJURIES ARE HIGH RISK TO DETERIORATE.  Please reconsult if develops eschar/necrotic tissue.    Thank you,    Powell Bar MSN, RN-BC, [employer]

## 2024-04-14 NOTE — Plan of Care (Signed)
  Problem: Clinical Measurements: Goal: Diagnostic test results will improve Outcome: Progressing   Problem: Pain Managment: Goal: General experience of comfort will improve and/or be controlled Outcome: Progressing   Problem: Skin Integrity: Goal: Risk for impaired skin integrity will decrease Outcome: Progressing

## 2024-04-14 NOTE — Significant Event (Signed)
 Rapid Response Event Note   Reason for Call :  HR 200s  Initial Focused Assessment:  Patient awake and alert, HR 200-220s, BP 86/64. Patient stated she felt fine. ECG confirmed SVT. Dr. Earley at bedside.  Interventions:  ZOLL pads placed on patient and connected to ZOLL monitor.   Per ACLS protocol, 6mg  adenosine  given, followed by 12mg  adenosine  when no change was noted. Rhythm unchanged. BP 89/66 following pushes.  Patient transferred to ICU stepdown. Dr. Catherine at bedside. 50mg  fentanyl  and 1mg  midazolam  ordered and given for pain prior to cardioversion.  Synchronized cardioversion at 1136 and 1138. Rhythm converted to afib RVR.   Amio bolus 150mg  given Amio infusion initiated  Following administration of fentanyl  and versed , patient's respiratory effort decreased and required bagging, however, recovered relatively quickly and was alert and talking within about 15 minutes.   Plan of Care:  Pt transferred and to remain in ICU   Event Summary:   MD Notified: Dr. Earley at bedside Call Time: 11:11 Arrival Time: 11:14 End Time: 1145  Zenobia Kuennen, RN

## 2024-04-14 NOTE — Progress Notes (Signed)
 Attempted Echocardiogram, RN needs time for patient care. Will come back later.

## 2024-04-14 NOTE — Progress Notes (Signed)
 [facility] Progress Note  Patient: Nicole Johnson     FMW:[id number]  DOA: 04/11/2024   PCP: Randeen Laine DELENA, MD       Brief hospital course: 63 y.o. female with medical history significant of metastatic pancreatic adenocarcinoma to liver, pancytopenia, cirrhosis with ascites, GERD, presented with worsening of abdominal pain. Husband had similar symptoms and it occurred after eating chicken. Husband's symptoms improved but hers did not.  Subjective:  Called to bedside for rapid heart rate. The patient had just ambulated to the bathroom and despite HR in the 200s, did not have any symptoms of dyspnea/palpitations/chest pain. Her husband stated that her oral intake had continued to be poor and she had barely eaten or drank.   Assessment and Plan: Principal Problem: Abdominal pain, large ascites - The patient states that her main complaint was lower abdominal pain-she does not admit to any nausea vomiting or diarrhea - Abdominal pain has improved after her paracentesis-4 L of fluid removed - The patient states that she does get regular paracentesis performed as outpatient - Ascitic fluid shows white blood cell count of 1610 - 84% of which are neutrophils-Gram stain is negative for organisms - abdomen non tender- do not feel she has peritonitis  - She states her oncologist feels that ascites is secondary to underlying cancer  Fever- probable gastroenteritis - Temperature 101.1 on 11/20 - no recurrence- Continue to follow for fevers   - oral intake continued to be poor with patient stating that she did not have any appetite- she did have occasional nausea-   SVT - SVT developed this AM- 2 doses of Adenosine  given w/o improvement - transferred to ICU and ICU attending consulted- given 2 shocks at 200 j- HR improved to 150-170- started on Amiodarone - currently being managed by critical care- cardiology consulted - no electrolyte abnormalities noted- no h/o abnormal heart rhythm - 2 D  ECHO pending   Anemia and thrombocytopenia - No signs of bleeding - She regularly receives platelet transfusions as outpatient and occasionally receives PRBCs - Typically her dropping blood counts are secondary to chemotherapy - 11/20- spoke with her hematologist, Dr. Rosy who recommended 1 mg /Kg of Nplate  which was given- 1 U platelets given - 11/22- platelet 15- 1 U platelets given - Hemoglobin was 6.1 but improved to 8.4 w/o transfusion  Pancreatic adenocarcinoma - She receives chemotherapy-is managed at [facility]  Portal vein thrombosis - Eliquis  on hold- discussed with her hematologist who recommends it be held until platelets improve or until she is able to f/u with him in the office  Cirrhosis of the liver    Code Status: Full Code Total time on patient care: 35 minutes DVT prophylaxis:  Place TED hose Start: 04/11/24 1301     Objective:   Vitals:   04/14/24 1310 04/14/24 1315 04/14/24 1320 04/14/24 1325  BP: (!) 80/57 (!) 97/59 90/70 94/60   Pulse:    (!) 154  Resp:    12  Temp:      TempSrc:      SpO2:    100%  Weight:      Height:       Filed Weights   04/11/24 1744  Weight: 62.7 kg   Exam: General exam: Appears comfortable  HEENT: oral mucosa dry Respiratory system: Clear to auscultation.  Cardiovascular system: S1 & S2 heard - tachycardic Gastrointestinal system: Abdomen soft, non-tender, moderately distended. Normal bowel sounds   Extremities: No cyanosis, clubbing-2+ bilateral edema Psychiatry:  Mood & affect  appropriate.      CBC: Recent Labs  Lab 04/11/24 0924 04/12/24 0813 04/13/24 0020 04/14/24 0320 04/14/24 1201  WBC 9.8 5.2 5.2 5.1 7.4  HGB 8.0* 6.1* 7.3* 8.3* 8.4*  HCT 25.2* 18.6* 22.4* 25.5* 25.1*  MCV 87.2 88.2 88.5 87.0 86.9  PLT 25* 16* 12* 15* 18*   Basic Metabolic Panel: Recent Labs  Lab 04/11/24 0924 04/12/24 0813 04/13/24 0020 04/14/24 1201  NA 128* 131* 129* 132*  K 4.2 4.6 4.3 4.2  CL 99 100 99 101  CO2 21* 22  22 21*  GLUCOSE 132* 96 111* 139*  BUN 39* 36* 35* 38*  CREATININE 1.04* 1.12* 1.14* 1.10*  CALCIUM 8.1* 8.4* 7.7* 7.9*     Scheduled Meds:  sodium chloride    Intravenous Once   Chlorhexidine  Gluconate Cloth  6 each Topical Q0600   midodrine   10 mg Oral TID WC   omeprazole   20 mg Oral BID AC   sodium chloride  flush  10-40 mL Intracatheter Q12H    Imaging and lab data personally reviewed   Author: [employer]  04/14/2024 1:33 PM  To contact [facility]>   Check the care team in [facility] and look for the attending/consulting [facility] provider listed  Log into [email or web address] and use [place]'s universal password   Go to> [facility]  and find provider  If you still have difficulty reaching the provider, please page the [facility] (Director on Call) for the Hospitalists listed on amion

## 2024-04-14 NOTE — Progress Notes (Signed)
 At bedside for Adenosine  administration and assisted with transporting to ICU (1232).

## 2024-04-15 ENCOUNTER — Inpatient Hospital Stay (HOSPITAL_COMMUNITY)

## 2024-04-15 DIAGNOSIS — N179 Acute kidney failure, unspecified: Secondary | ICD-10-CM | POA: Diagnosis not present

## 2024-04-15 DIAGNOSIS — I4891 Unspecified atrial fibrillation: Secondary | ICD-10-CM | POA: Diagnosis not present

## 2024-04-15 DIAGNOSIS — R578 Other shock: Secondary | ICD-10-CM | POA: Diagnosis not present

## 2024-04-15 DIAGNOSIS — R570 Cardiogenic shock: Secondary | ICD-10-CM | POA: Diagnosis not present

## 2024-04-15 DIAGNOSIS — D696 Thrombocytopenia, unspecified: Secondary | ICD-10-CM | POA: Diagnosis not present

## 2024-04-15 LAB — CBC WITH DIFFERENTIAL/PLATELET
Abs Immature Granulocytes: 0.12 K/uL — ABNORMAL HIGH (ref 0.00–0.07)
Basophils Absolute: 0 K/uL (ref 0.0–0.1)
Basophils Relative: 0 %
Eosinophils Absolute: 0 K/uL (ref 0.0–0.5)
Eosinophils Relative: 0 %
HCT: 29.2 % — ABNORMAL LOW (ref 36.0–46.0)
Hemoglobin: 9.5 g/dL — ABNORMAL LOW (ref 12.0–15.0)
Immature Granulocytes: 1 %
Lymphocytes Relative: 3 %
Lymphs Abs: 0.3 K/uL — ABNORMAL LOW (ref 0.7–4.0)
MCH: 28.4 pg (ref 26.0–34.0)
MCHC: 32.5 g/dL (ref 30.0–36.0)
MCV: 87.2 fL (ref 80.0–100.0)
Monocytes Absolute: 0.3 K/uL (ref 0.1–1.0)
Monocytes Relative: 4 %
Neutro Abs: 7.7 K/uL (ref 1.7–7.7)
Neutrophils Relative %: 92 %
Platelets: 29 K/uL — CL (ref 150–400)
RBC: 3.35 MIL/uL — ABNORMAL LOW (ref 3.87–5.11)
RDW: 20.1 % — ABNORMAL HIGH (ref 11.5–15.5)
Smear Review: NORMAL
WBC: 8.5 K/uL (ref 4.0–10.5)
nRBC: 0 % (ref 0.0–0.2)

## 2024-04-15 LAB — ECHOCARDIOGRAM COMPLETE
AR max vel: 2.08 cm2
AV Area VTI: 1.99 cm2
AV Area mean vel: 2.18 cm2
AV Mean grad: 5.8 mmHg
AV Peak grad: 12.2 mmHg
Ao pk vel: 1.75 m/s
Area-P 1/2: 8.52 cm2
Height: 65 in
S' Lateral: 2.1 cm
Weight: 2211.65 [oz_av]

## 2024-04-15 LAB — HEMOGLOBIN A1C
Hgb A1c MFr Bld: 6.1 % — ABNORMAL HIGH (ref 4.8–5.6)
Mean Plasma Glucose: 128.37 mg/dL

## 2024-04-15 LAB — COMPREHENSIVE METABOLIC PANEL WITH GFR
ALT: 19 U/L (ref 0–44)
AST: 34 U/L (ref 15–41)
Albumin: 3 g/dL — ABNORMAL LOW (ref 3.5–5.0)
Alkaline Phosphatase: 94 U/L (ref 38–126)
Anion gap: 8 (ref 5–15)
BUN: 38 mg/dL — ABNORMAL HIGH (ref 8–23)
CO2: 23 mmol/L (ref 22–32)
Calcium: 7.8 mg/dL — ABNORMAL LOW (ref 8.9–10.3)
Chloride: 100 mmol/L (ref 98–111)
Creatinine, Ser: 0.98 mg/dL (ref 0.44–1.00)
GFR, Estimated: >60 mL/min (ref >60)
Glucose, Bld: 136 mg/dL — ABNORMAL HIGH (ref 70–99)
Potassium: 3.7 mmol/L (ref 3.5–5.1)
Sodium: 132 mmol/L — ABNORMAL LOW (ref 135–145)
Total Bilirubin: 2.6 mg/dL — ABNORMAL HIGH (ref 0.0–1.2)
Total Protein: 4.8 g/dL — ABNORMAL LOW (ref 6.5–8.1)

## 2024-04-15 LAB — GASTROINTESTINAL PANEL BY PCR, STOOL (REPLACES STOOL CULTURE)

## 2024-04-15 LAB — GLUCOSE, CAPILLARY
Glucose-Capillary: 110 mg/dL — ABNORMAL HIGH (ref 70–99)
Glucose-Capillary: 115 mg/dL — ABNORMAL HIGH (ref 70–99)
Glucose-Capillary: 121 mg/dL — ABNORMAL HIGH (ref 70–99)
Glucose-Capillary: 126 mg/dL — ABNORMAL HIGH (ref 70–99)
Glucose-Capillary: 129 mg/dL — ABNORMAL HIGH (ref 70–99)
Glucose-Capillary: 131 mg/dL — ABNORMAL HIGH (ref 70–99)
Glucose-Capillary: 158 mg/dL — ABNORMAL HIGH (ref 70–99)

## 2024-04-15 LAB — CBC
HCT: 21.3 % — ABNORMAL LOW (ref 36.0–46.0)
Hemoglobin: 6.8 g/dL — CL (ref 12.0–15.0)
MCH: 28.2 pg (ref 26.0–34.0)
MCHC: 31.9 g/dL (ref 30.0–36.0)
MCV: 88.4 fL (ref 80.0–100.0)
Platelets: 22 K/uL — CL (ref 150–400)
RBC: 2.41 MIL/uL — ABNORMAL LOW (ref 3.87–5.11)
RDW: 20.9 % — ABNORMAL HIGH (ref 11.5–15.5)
WBC: 4.6 K/uL (ref 4.0–10.5)
nRBC: 0 % (ref 0.0–0.2)

## 2024-04-15 LAB — MAGNESIUM: Magnesium: 2.1 mg/dL (ref 1.7–2.4)

## 2024-04-15 LAB — PHOSPHORUS: Phosphorus: 2.6 mg/dL (ref 2.5–4.6)

## 2024-04-15 LAB — PREPARE RBC (CROSSMATCH)

## 2024-04-15 MED ORDER — SODIUM CHLORIDE 0.9% IV SOLUTION: Freq: Once | INTRAVENOUS | Status: AC

## 2024-04-15 MED ORDER — PHENYLEPHRINE CONCENTRATED 100MG/250ML (0.4 MG/ML) INFUSION SIMPLE
0.0000 ug/min | INTRAVENOUS | Status: DC
  Administered 2024-04-16 (×2): 90 ug/min via INTRAVENOUS
  Administered 2024-04-17: 220 ug/min via INTRAVENOUS
  Administered 2024-04-17: 130 ug/min via INTRAVENOUS
  Administered 2024-04-18: 140 ug/min via INTRAVENOUS
  Filled 2024-04-15 (×6): qty 250

## 2024-04-15 MED ORDER — POTASSIUM CHLORIDE CRYS ER 20 MEQ PO TBCR
40.0000 meq | EXTENDED_RELEASE_TABLET | Freq: Once | ORAL | Status: AC
  Administered 2024-04-15: 40 meq via ORAL
  Filled 2024-04-15: qty 2

## 2024-04-15 MED ORDER — AMIODARONE LOAD VIA INFUSION
150.0000 mg | Freq: Once | INTRAVENOUS | Status: AC
  Administered 2024-04-15: 150 mg via INTRAVENOUS
  Filled 2024-04-15: qty 83.34

## 2024-04-15 MED ORDER — AMIODARONE IV BOLUS ONLY 150 MG/100ML
150.0000 mg | Freq: Once | INTRAVENOUS | Status: DC
  Filled 2024-04-15: qty 100

## 2024-04-15 NOTE — Evaluation (Signed)
 Clinical/Bedside Swallow Evaluation Patient Details  Name: Nicole Johnson MRN: [id number] Date of Birth: 28-Dec-1960  Today's Date: 04/15/2024 Time: SLP Start Time (ACUTE ONLY): 1617 SLP Stop Time (ACUTE ONLY): 1636 SLP Time Calculation (min) (ACUTE ONLY): 19 min  Past Medical History:  Past Medical History:  Diagnosis Date   Arthritis    Cancer (HCC)    pancreatic   GERD (gastroesophageal reflux disease)    PONV (postoperative nausea and vomiting)    Prediabetes    Past Surgical History:  Past Surgical History:  Procedure Laterality Date   BILIARY BRUSHING  12/17/2022   Procedure: BILIARY BRUSHING;  Surgeon: Rollin Dover, MD;  Location: [facility];  Service: Gastroenterology;;   BILIARY STENT PLACEMENT N/A 12/17/2022   Procedure: BILIARY STENT PLACEMENT;  Surgeon: Rollin Dover, MD;  Location: [facility];  Service: Gastroenterology;  Laterality: N/A;   BILIARY STENT PLACEMENT N/A 04/05/2023   Procedure: BILIARY STENT PLACEMENT;  Surgeon: Rollin Dover, MD;  Location: [facility];  Service: Gastroenterology;  Laterality: N/A;   COLONOSCOPY N/A 10/11/2023   Procedure: COLONOSCOPY;  Surgeon: Rollin Dover, MD;  Location: [facility];  Service: Gastroenterology;  Laterality: N/A;   ERCP N/A 12/17/2022   Procedure: ENDOSCOPIC RETROGRADE CHOLANGIOPANCREATOGRAPHY (ERCP);  Surgeon: Rollin Dover, MD;  Location: [facility];  Service: Gastroenterology;  Laterality: N/A;   ERCP N/A 04/05/2023   Procedure: ENDOSCOPIC RETROGRADE CHOLANGIOPANCREATOGRAPHY (ERCP);  Surgeon: Rollin Dover, MD;  Location: [facility];  Service: Gastroenterology;  Laterality: N/A;   ESOPHAGOGASTRODUODENOSCOPY N/A 10/11/2023   Procedure: EGD (ESOPHAGOGASTRODUODENOSCOPY);  Surgeon: Rollin Dover, MD;  Location: [facility];  Service: Gastroenterology;  Laterality: N/A;   ESOPHAGOGASTRODUODENOSCOPY (EGD) WITH PROPOFOL  N/A 12/18/2022   Procedure: ESOPHAGOGASTRODUODENOSCOPY (EGD) WITH PROPOFOL ;  Surgeon: Rollin Dover, MD;  [facility];  Service: Gastroenterology;  Laterality: N/A;   ESOPHAGOGASTRODUODENOSCOPY (EGD) WITH PROPOFOL  N/A 12/30/2022   Procedure: ESOPHAGOGASTRODUODENOSCOPY (EGD) WITH PROPOFOL ;  Surgeon: Rollin Dover, MD;  Location[facility];  Service: Gastroenterology;  Laterality: N/A;   EUS N/A 12/18/2022   Procedure: UPPER ENDOSCOPIC ULTRASOUND (EUS) LINEAR;  Surgeon: Rollin Dover, MD;  Location: [facility];  Service: Gastroenterology;  Laterality: N/A;   FINE NEEDLE ASPIRATION N/A 12/18/2022   Procedure: FINE NEEDLE ASPIRATION (FNA) LINEAR;  Surgeon: Rollin Dover, MD;  Location: [facility];  Service: Gastroenterology;  Laterality: N/A;   FINE NEEDLE ASPIRATION  12/30/2022   Procedure: FINE NEEDLE ASPIRATION (FNA) LINEAR;  Surgeon: Rollin Dover, MD;  Location: [facility]COPY;  Service: Gastroenterology;;   IR PARACENTESIS  01/17/2024   IR PARACENTESIS  02/15/2024   IR PARACENTESIS  03/29/2024   LAPAROSCOPIC ABDOMINAL EXPLORATION     SPHINCTEROTOMY  12/17/2022   Procedure: ANNETT;  Surgeon: Rollin Dover, MD;  Location: [facility];  Service: Gastroenterology;;   CLEDA REMOVAL  04/05/2023   Procedure: STENT REMOVAL;  Surgeon: Rollin Dover, MD;  Location: [facility];  Service: Gastroenterology;;   UPPER ESOPHAGEAL ENDOSCOPIC ULTRASOUND (EUS) N/A 12/30/2022   Procedure: UPPER ESOPHAGEAL ENDOSCOPIC ULTRASOUND (EUS);  Surgeon: Rollin Dover, MD;  Location: [facility];  Service: Gastroenterology;  Laterality: N/A;   HPI:  JENALYN GIRDNER is a 63 y.o. presented with worsening of symptoms nausea, vomiting abdominal pain diagnosed with gastroenteritis (possible food toxin from chicken. EGD 11/10/2021 3 cm hiatal hernia,multiple gastric polyps. PMH: metastatic pancreatic adenocarcinoma to liver, pancytopenia, cirrhosis with ascites, GERD,    Assessment / Plan / Recommendation  Clinical Impression  Pt presents with suspected primary esophageal dysphagia. She states some pain  with swallow and pharyngeal  globus sensation. Lingual candidias and may have hypophayrngeal as well. Observed swallowing pills with RN (took 3 small at once), water  and graham cracker. No indications of decreased airway protection. Her swallow with pills, solids was effortful and reported felt like it didn't go down. Took sips water  with extra swallows and no longer sensed material in her throat. She grimaced several times admitting to  pain as it progressed, touching stomach with belching x 1. It is possible she could have transient pharyngeal retention due to overall weakness that clears with sips/swallows however suspect source may be esophageal given observations, current gastroenteritis and history of GERD. Recommend continue regular diet and may want to order softer foods and work her way up to more solids if desires. Pills as she feels comfortable (no more than 2 at a time and 1 if large). Slow pace, take sip of liquid after swallow and additional swallow if needed. Sit up with meals and stay up 30-45 min after po's. Requested RN ask MD re: meds for candidias if warranted. No f/u needed at this time. If continues to have difficulty after recovers from this illness, would recommend she see GI. SLP Visit Diagnosis: Dysphagia, unspecified (R13.10)    Aspiration Risk  Mild aspiration risk    Diet Recommendation Regular;Thin liquid    Liquid Administration via: Straw;Cup Medication Administration: Whole meds with liquid (1-2 at a time if small) Supervision: Patient able to self feed Compensations: Slow rate;Small sips/bites;Follow solids with liquid Postural Changes: Seated upright at 90 degrees;Remain upright for at least 30 minutes after po intake    Other  Recommendations Oral Care Recommendations: Oral care BID     Assistance Recommended at Discharge    Functional Status Assessment Patient has not had a recent decline in their functional status  Frequency and Duration             Prognosis        Swallow Study   General Date of Onset: 04/14/24 HPI: SHAKEYA KERKMAN is a 63 y.o. presented with worsening of symptoms nausea, vomiting abdominal pain diagnosed with gastroenteritis (possible food toxin from chicken. EGD 11/10/2021 3 cm hiatal hernia,multiple gastric polyps. PMH: metastatic pancreatic adenocarcinoma to liver, pancytopenia, cirrhosis with ascites, GERD, Type of Study: Bedside Swallow Evaluation Previous Swallow Assessment:  (none) Diet Prior to this Study: Regular;Thin liquids (Level 0) Temperature Spikes Noted: No Respiratory Status: Nasal cannula History of Recent Intubation: No Behavior/Cognition: Alert;Pleasant mood;Cooperative Oral Cavity Assessment: Other (comment) (lingual candidias) Oral Care Completed by SLP: No Oral Cavity - Dentition: Adequate natural dentition Vision: Functional for self-feeding Self-Feeding Abilities: Able to feed self Patient Positioning: Upright in bed Baseline Vocal Quality: Normal Volitional Cough: Strong Volitional Swallow: Able to elicit    Oral/Motor/Sensory Function Overall Oral Motor/Sensory Function: Within functional limits   Ice Chips Ice chips: Not tested   Thin Liquid Thin Liquid: Within functional limits Presentation: Straw    Nectar Thick Nectar Thick Liquid: Not tested   Honey Thick Honey Thick Liquid: Not tested   Puree Puree: Not tested   Solid     Solid: Impaired Oral Phase Impairments:  (none) Oral Phase Functional Implications:  (none) Pharyngeal Phase Impairments: Other (comments) (effortful, globus- suspect esophageal or could have transient pharyngeal residue with weakness that clears with liquid and extra swallow)      Dustin Olam Bull 04/15/2024,5:03 PM

## 2024-04-15 NOTE — Progress Notes (Signed)
 NAME:  TALINA PLEITEZ, MRN:  [id number], DOB:  08-03-1960, LOS: 3 ADMISSION DATE:  04/11/2024, CONSULTATION DATE:  04/15/24 REFERRING MD:  Dr. Earley, CHIEF COMPLAINT:  A. Fib with RVR.   History of Present Illness:  63 y.o. female with medical history significant of metastatic pancreatic adenocarcinoma to liver, pancytopenia, cirrhosis with ascites, GERD, presented with worsening of abdominal pain likely due to large ascites (LVP on 11/19, drained 4.2 L, improved abdominal pain)  with hospital course c/b SVT requiring hemodynamic monitoring in ICU.  HPI: Patient had her routine chemotherapy last Wednesday and she has been feeling fine afterwards.  Friday, patient and her husband ate a roasted chicken for lunch and both became sick in the evening, their symptoms including abdominal pain nauseous vomiting, they attributed to possible food toxin from the chicken.  Husband however recovered the next day and the patient on the other hand continue to feel sick, she had 1 day of diarrhea and continued to have nauseous and vomiting and decreased appetite for last 3 to 4 days, she could only tolerate some Ensure, and became very dehydrated.  She denied any hematemesis or hematochezia.  Her abdominal pain mainly located on lower abdomen bilateral, constant since yesterday.  She denied any fever or chills.  She told me that she is scheduled to have platelet transfusion at [facility] center today.   ED Course: Afebrile, borderline tachycardia blood pressure 92/60, O2 saturation 100% on room air.  CT abdomen pelvis showed no acute findings, moderate ascites.  Blood work showed INR 1.6, sodium 128 BUN 39 creatinine 1.0 glucose 132 albumin  2.3, WBC 9.8 hemoglobin 8.0 compared to baseline 8.2-9.6, platelet 25.  Pertinent  Medical History   Past Medical History:  Diagnosis Date   Arthritis    Cancer (HCC)    pancreatic   GERD (gastroesophageal reflux disease)    PONV (postoperative nausea and vomiting)     Prediabetes    Significant Hospital Events: Including procedures, antibiotic start and stop dates in addition to other pertinent events   11/19 --> admitted, large volume paracentesis 4.2 L drained 11/22 --> SVT, given adenosine  6 mg and 12 mg, did not abort, came down to ICU for DCCV. Given amio bolus and infusion, came out of a. Fib into NSR. 11/23 --> overnight developed another episode of a. Fib with RVR, given another bolus of amio, back to NSR. Hgb down to 6.8, 1 unit of PRBCs ordered.  Interim History / Subjective:  Feeling better today. No chest pain, SOB, or dizziness Afebrile, HR 80s - 130s, hemodynamics supported with 60 of phenylephrine , SPO2 > 90% on 3 L O2 by [place] I/O + 323.7 mL, since admit + 1.7 L Drips: amiodarone  drip, phenylephrine  70 mcg/min Lines/Tubes: R chest port, PIVs, external urinary device, GI stent Objective    Blood pressure (!) 99/59, pulse (!) 129, temperature 98.2 F (36.8 C), temperature source Oral, resp. rate 18, height 5' 5 (1.651 m), weight 62.7 kg, last menstrual period 01/11/2011, SpO2 96%.        Intake/Output Summary (Last 24 hours) at 04/15/2024 1041 Last data filed at 04/15/2024 1040 Gross per 24 hour  Intake 2247.45 ml  Output 2125 ml  Net 122.45 ml   Filed Weights   04/11/24 1744  Weight: 62.7 kg    Examination: General: cachectic, chronically ill appearing Eyes: PERRL, no scleral icterus ENMT: oropharynx clear, good dentition, no oral lesions, mallampati score I Skin: warm, intact, no rashes Neck: JVD elevated, ROM and  lymph node assessment nL CV: RRR, no MRG, nl S1 and S2, 2+ pitting peripheral edema Resp: decreased breath sounds in bases, clear anteriorly Abdom: + ascites, no abdominal pain on palpation. Neuro: Awake alert oriented to person place time and situation  Resolved problem list   Assessment and Plan   #Shock: likely mixed cardiogenic due to tachyarrhythmia and septic in the setting of peritonitis. #Atrial  Fibrillation with RVR: CHADS2VASC is 1.  - Continue amiodarone  infusion, bolus amio 150 as needed for RVR - Holding off on anticoagulation due to low chads2vasc and severe thrombocytopenia - Phenylephrine  drip - Continue midodrine  10 mg TID - FU echo results - Target MAP > 65 mmHg  #SBP vs. Secondary peritonitis: paracentesis yielded 1610 leukocytes, PMN 84% I.e.1352 PMNs. This is c/w with diagnosis of spontaneous bacterial peritonitis versus secondary peritonitis. - Continue Ceftriaxone  - FU peritoneal cultures  #Stress Ulcer PPx: continue PPI  #Nutrition: continue regular diet  #AKI: likely pre-renal due to shock state - Supportive care as above - Renally adjust medications - Avoid Nephrotoxins - Strict I/Os - Foley catheter  #Severe Thrombocytopenia #Normocytic Anemia > Likely due to bone marrow suppression from chemoRx. - Transfuse PLT for goal > 10 - Transfuse Hgb for goal > 7 mg/dL  Disposition: ICU appropriate for hemodynamic monitoring and vasopressors.  Labs   CBC: Recent Labs  Lab 04/12/24 0813 04/13/24 0020 04/14/24 0320 04/14/24 1201 04/15/24 0459  WBC 5.2 5.2 5.1 7.4 4.6  HGB 6.1* 7.3* 8.3* 8.4* 6.8*  HCT 18.6* 22.4* 25.5* 25.1* 21.3*  MCV 88.2 88.5 87.0 86.9 88.4  PLT 16* 12* 15* 18* 22*    Basic Metabolic Panel: Recent Labs  Lab 04/11/24 0924 04/12/24 0813 04/13/24 0020 04/14/24 1201 04/15/24 0459  NA 128* 131* 129* 132* 132*  K 4.2 4.6 4.3 4.2 3.7  CL 99 100 99 101 100  CO2 21* 22 22 21* 23  GLUCOSE 132* 96 111* 139* 136*  BUN 39* 36* 35* 38* 38*  CREATININE 1.04* 1.12* 1.14* 1.10* 0.98  CALCIUM 8.1* 8.4* 7.7* 7.9* 7.8*  MG  --   --   --   --  2.1  PHOS  --   --   --   --  2.6   GFR: Estimated Creatinine Clearance: 53.6 mL/min (by C-G formula based on SCr of 0.98 mg/dL). Recent Labs  Lab 04/13/24 0020 04/14/24 0320 04/14/24 1200 04/14/24 1201 04/14/24 1442 04/15/24 0459  WBC 5.2 5.1  --  7.4  --  4.6  LATICACIDVEN  --   --   2.6*  --  1.8  --     Liver Function Tests: Recent Labs  Lab 04/11/24 0924 04/12/24 0813 04/14/24 1201 04/15/24 0459  AST 24 22 45* 34  ALT 27 19 24 19   ALKPHOS 245* 147* 135* 94  BILITOT 1.5* 1.8* 3.2* 2.6*  PROT 5.1* 4.9* 4.8* 4.8*  ALBUMIN  2.3* 2.9* 2.3* 3.0*   Recent Labs  Lab 04/11/24 0924  LIPASE <10*   Recent Labs  Lab 04/12/24 0813  AMMONIA 31    ABG    Component Value Date/Time   TCO2 23 10/08/2023 1227     Coagulation Profile: Recent Labs  Lab 04/11/24 0924 04/12/24 0813 04/13/24 0020  INR 1.6* 1.9* 1.7*    Cardiac Enzymes: No results for input(s): CKTOTAL, CKMB, CKMBINDEX, TROPONINI in the last 168 hours.  HbA1C: Hgb A1c MFr Bld  Date/Time Value Ref Range Status  04/15/2024 04:59 AM 6.1 (H) 4.8 - 5.6 %  Final    Comment:    (NOTE) Diagnosis of Diabetes The following HbA1c ranges recommended by the American Diabetes Association (ADA) may be used as an aid in the diagnosis of diabetes mellitus.  Hemoglobin             Suggested A1C NGSP%              Diagnosis  <5.7                   Non Diabetic  5.7-6.4                Pre-Diabetic  >6.4                   Diabetic  <7.0                   Glycemic control for                       adults with diabetes.    03/14/2023 09:09 AM 6.2 4.6 - 6.5 % Final    Comment:    Glycemic Control Guidelines for People with Diabetes:Non Diabetic:  <6%Goal of Therapy: <7%Additional Action Suggested:  >8%     CBG: Recent Labs  Lab 04/14/24 1834 04/14/24 2159 04/15/24 0102 04/15/24 0403 04/15/24 0744  GLUCAP 164* 150* 115* 131* 129*    Review of Systems:   Not obtained  Past Medical History:  She,  has a past medical history of Arthritis, Cancer (HCC), GERD (gastroesophageal reflux disease), PONV (postoperative nausea and vomiting), and Prediabetes.   Surgical History:   Past Surgical History:  Procedure Laterality Date   BILIARY BRUSHING  12/17/2022   Procedure: BILIARY  BRUSHING;  Surgeon: Rollin Dover, MD;  Location: [facility];  Service: Gastroenterology;;   BILIARY STENT PLACEMENT N/A 12/17/2022   Procedure: BILIARY STENT PLACEMENT;  Surgeon: Rollin Dover, MD;  Location: [facility];  Service: Gastroenterology;  Laterality: N/A;   BILIARY STENT PLACEMENT N/A 04/05/2023   Procedure: BILIARY STENT PLACEMENT;  Surgeon: Rollin Dover, MD;  Location: [facility];  Service: Gastroenterology;  Laterality: N/A;   COLONOSCOPY N/A 10/11/2023   Procedure: COLONOSCOPY;  Surgeon: Rollin Dover, MD;  Location: [facility];  Service: Gastroenterology;  Laterality: N/A;   ERCP N/A 12/17/2022   Procedure: ENDOSCOPIC RETROGRADE CHOLANGIOPANCREATOGRAPHY (ERCP);  Surgeon: Rollin Dover, MD;  Location: [facility];  Service: Gastroenterology;  Laterality: N/A;   ERCP N/A 04/05/2023   Procedure: ENDOSCOPIC RETROGRADE CHOLANGIOPANCREATOGRAPHY (ERCP);  Surgeon: Rollin Dover, MD;  Location: [facility];  Service: Gastroenterology;  Laterality: N/A;   ESOPHAGOGASTRODUODENOSCOPY N/A 10/11/2023   Procedure: EGD (ESOPHAGOGASTRODUODENOSCOPY);  Surgeon: Rollin Dover, MD;  Location: [facility];  Service: Gastroenterology;  Laterality: N/A;   ESOPHAGOGASTRODUODENOSCOPY (EGD) WITH PROPOFOL  N/A 12/18/2022   Procedure: ESOPHAGOGASTRODUODENOSCOPY (EGD) WITH PROPOFOL ;  Surgeon: Rollin Dover, MD;  Location: [facility];  Service: Gastroenterology;  Laterality: N/A;   ESOPHAGOGASTRODUODENOSCOPY (EGD) WITH PROPOFOL  N/A 12/30/2022   Procedure: ESOPHAGOGASTRODUODENOSCOPY (EGD) WITH PROPOFOL ;  Surgeon: Rollin Dover, MD;  Location: [facility] END[facility]COPY;  Service: Gastroenterology;  Laterality: N/A;   EUS N/A 12/18/2022   Procedure: UPPER ENDOSCOPIC ULTRASOUND (EUS) LINEAR;  Surgeon: Rollin Dover, MD;  Location: [facility];  Service: Gastroenterology;  Laterality: N/A;   FINE NEEDLE ASPIRATION N/A 12/18/2022   Procedure: FINE NEEDLE ASPIRATION (FNA) LINEAR;  Surgeon: Rollin Dover, MD;  Location: [facility];  Service: Gastroenterology;  Laterality: N/A;   FINE NEEDLE ASPIRATION  12/30/2022   Procedure: FINE  NEEDLE ASPIRATION (FNA) LINEAR;  Surgeon: Rollin Dover, MD;  Location: [facility] ENDOSCOPY;  Service: Gastroenterology;;   IR PARACENTESIS  01/17/2024   IR PARACENTESIS  02/15/2024   IR PARACENTESIS  03/29/2024   LAPAROSCOPIC ABDOMINAL EXPLORATION     SPHINCTEROTOMY  12/17/2022   Procedure: ANNETT;  Surgeon: Rollin Dover, MD;  Location: [facility];  Service: Gastroenterology;;   CLEDA REMOVAL  04/05/2023   Procedure: STENT REMOVAL;  Surgeon: Rollin Dover, MD;  Location: [facility];  Service: Gastroenterology;;   UPPER ESOPHAGEAL ENDOSCOPIC ULTRASOUND (EUS) N/A 12/30/2022   Procedure: UPPER ESOPHAGEAL ENDOSCOPIC ULTRASOUND (EUS);  Surgeon: Rollin Dover, MD;  Location[facility] END[facility]COPY;  Service: Gastroenterology;  Laterality: N/A;     Social History:   reports that she has never smoked. She has never used smokeless tobacco. She reports that she does not currently use alcohol . She reports that she does not use drugs.   Family History:  Her family history includes Breast cancer in her maternal grandmother; Breast cancer (age of onset: 27) in her sister; Cancer (age of onset: 8) in her brother; Diabetes in her brother, father, mother, and sister; Heart disease in her maternal grandfather and maternal grandmother; Lung cancer (age of onset: 79) in her father; Stroke in her mother.   Allergies Allergies  Allergen Reactions   Other Nausea Only and Other (See Comments)    Certain types of Anesthesia cause SEVERE NAUSEA   Codeine Nausea Only   Hydrocodone -Acetaminophen  Nausea Only and Other (See Comments)    Reaction unconfirmed, ed/might be nausea   Meloxicam Nausea Only   Olanzapine Other (See Comments)    Made me very sleepy, but I could not sleep.   Oxycodone  Other (See Comments)    Made me loopy.   Tramadol Nausea Only     Home Medications  Prior to Admission medications    Medication Sig Start Date End Date Taking? Authorizing Provider  ADVIL  200 MG CAPS Take 400 mg by mouth every 6 (six) hours as needed (for pain).   Yes [provider]  Cyanocobalamin  (VITAMIN B-12 PO) Place 1 tablet under the tongue daily.   Yes [provider]  ELIQUIS  5 MG TABS tablet Take 2.5 mg by mouth 2 (two) times daily.   Yes [provider]  FULPHILA 6 MG/0.6ML injection Inject 12 mg into the skin See admin instructions. Inject 12 mg into the skin every other Thursday- the  day after chemotherapy   Yes [provider]  MAGNESIUM  PO Take 2 tablets by mouth See admin instructions.   Yes [provider]  midodrine  (PROAMATINE ) 10 MG tablet Take 1 tablet (10 mg total) by mouth 3 (three) times daily with meals. 03/19/24 06/17/24 Yes [place], Eric J, DO  ondansetron  (ZOFRAN ) 8 MG tablet Take 8 mg by mouth 2 (two) times daily as needed for nausea or vomiting.   Yes [provider]  PRILOSEC OTC 20 MG tablet Take 20 mg by mouth 2 (two) times daily before a meal.   Yes [provider]  prochlorperazine  (COMPAZINE ) 10 MG tablet Take 10 mg by mouth every 6 (six) hours as needed for nausea or vomiting.   Yes [provider]  spironolactone  (ALDACTONE ) 100 MG tablet Take 50 mg by mouth daily.   Yes [provider]     Due to a high probability of clinically significant, life threatening deterioration, the patient required my highest level of preparedness to intervene emergently and I personally spent this critical care time directly and  personally managing the patient. This critical care time included obtaining a history; examining the patient; pulse oximetry; ordering and review of studies; arranging urgent treatment with development of a management plan; evaluation of patient's response to treatment; frequent reassessment; and, discussions with other providers.  This critical care time was performed to assess and manage  the high probability of imminent, life-threatening deterioration that could result in multi-organ failure. It was exclusive of separately billable procedures and treating other patients and teaching time. Critical Care Time: 40 minutes  Paula Southerly, MD [facility]

## 2024-04-15 NOTE — Progress Notes (Signed)
 Following amiodarone  bolus and increased infusion rate, persistent tachycardia noted. Patient also c/o abdominal pain, same as previous, morphine  administered per prn order.  Bladder scan revealed >971mL volume; order for I&O cath obtained with 1800cc urine drained from bladder at 0340. HR decreased to 80s-90s following bladder emptying. At 0445 BP declined to 72/42 and phenylephrine  infusion restarted and titrated per order. HR subsequently increased to 147, then remained consistently greater than 120 with rare decreases to 110s. E-Link RN [employer] notified of above.  04/15/2024 5:38 AM Notified e-Link that patient's HR has now declined to 80, sinus rhythm.  Cindy S. Loreli BSN, RN, [facility], CCRN 04/15/2024 5:39 AM

## 2024-04-15 NOTE — Plan of Care (Signed)
  Problem: Education: Goal: Knowledge of General Education information will improve Description: Including pain rating scale, medication(s)/side effects and non-pharmacologic comfort measures Outcome: Progressing   Problem: Health Behavior/Discharge Planning: Goal: Ability to manage health-related needs will improve Outcome: Progressing   Problem: Clinical Measurements: Goal: Ability to maintain clinical measurements within normal limits will improve Outcome: Progressing Goal: Will remain free from infection Outcome: Progressing Goal: Diagnostic test results will improve Outcome: Progressing Goal: Respiratory complications will improve Outcome: Progressing Goal: Cardiovascular complication will be avoided Outcome: Progressing   Problem: Activity: Goal: Risk for activity intolerance will decrease Outcome: Progressing   Problem: Nutrition: Goal: Adequate nutrition will be maintained Outcome: Progressing   Problem: Coping: Goal: Level of anxiety will decrease Outcome: Progressing   Problem: Elimination: Goal: Will not experience complications related to bowel motility Outcome: Progressing Goal: Will not experience complications related to urinary retention Outcome: Progressing   Problem: Pain Managment: Goal: General experience of comfort will improve and/or be controlled Outcome: Progressing   Problem: Safety: Goal: Ability to remain free from injury will improve Outcome: Progressing   Problem: Skin Integrity: Goal: Risk for impaired skin integrity will decrease Outcome: Progressing   Cindy S. Loreli BSN, RN, [facility], CCRN 04/15/2024 3:42 AM

## 2024-04-15 NOTE — Progress Notes (Addendum)
 eLink Physician-Brief Progress Note Patient Name: Nicole Johnson DOB: 01-30-1961 MRN: [id number]   Date of Service  04/15/2024  HPI/Events of Note  Refractory afib 150s-160s despite amio @30   eICU Interventions  Rebolus and increase to 60   0306 -retention protocol and In-N-Out cath as needed for >999cc measurement  0700 -mild hypotension but a.m. labs reviewed, hemoglobin 6.8.  Transfuse 1 unit PRBC  Intervention Category Intermediate Interventions: Arrhythmia - evaluation and management  Nile Dorning 04/15/2024, 2:15 AM

## 2024-04-15 NOTE — Progress Notes (Signed)
  Echocardiogram 2D Echocardiogram has been performed.  Tinnie FORBES Gosling RDCS 04/15/2024, 9:38 AM

## 2024-04-16 DIAGNOSIS — D611 Drug-induced aplastic anemia: Secondary | ICD-10-CM

## 2024-04-16 DIAGNOSIS — Z515 Encounter for palliative care: Secondary | ICD-10-CM

## 2024-04-16 DIAGNOSIS — E611 Iron deficiency: Secondary | ICD-10-CM

## 2024-04-16 DIAGNOSIS — Z7189 Other specified counseling: Secondary | ICD-10-CM | POA: Diagnosis not present

## 2024-04-16 DIAGNOSIS — A419 Sepsis, unspecified organism: Secondary | ICD-10-CM

## 2024-04-16 DIAGNOSIS — D6481 Anemia due to antineoplastic chemotherapy: Secondary | ICD-10-CM

## 2024-04-16 DIAGNOSIS — R109 Unspecified abdominal pain: Secondary | ICD-10-CM

## 2024-04-16 DIAGNOSIS — D696 Thrombocytopenia, unspecified: Secondary | ICD-10-CM | POA: Diagnosis not present

## 2024-04-16 DIAGNOSIS — K529 Noninfective gastroenteritis and colitis, unspecified: Secondary | ICD-10-CM | POA: Diagnosis not present

## 2024-04-16 DIAGNOSIS — R188 Other ascites: Secondary | ICD-10-CM

## 2024-04-16 DIAGNOSIS — R579 Shock, unspecified: Secondary | ICD-10-CM

## 2024-04-16 DIAGNOSIS — R4589 Other symptoms and signs involving emotional state: Secondary | ICD-10-CM

## 2024-04-16 DIAGNOSIS — K659 Peritonitis, unspecified: Secondary | ICD-10-CM

## 2024-04-16 LAB — BPAM PLATELET PHERESIS
Blood Product Expiration Date: 202511242359
Blood Product Expiration Date: 202511242359
Blood Product Expiration Date: 202511242359
ISSUE DATE / TIME: 202511210953
ISSUE DATE / TIME: 202511220515
ISSUE DATE / TIME: 202511221644
Unit Type and Rh: 7300
Unit Type and Rh: 5100
Unit Type and Rh: 6200

## 2024-04-16 LAB — TYPE AND SCREEN
ABO/RH(D): O POS
Antibody Screen: NEGATIVE
Unit division: 0

## 2024-04-16 LAB — MRSA NEXT GEN BY PCR, NASAL: MRSA by PCR Next Gen: NOT DETECTED

## 2024-04-16 LAB — GLUCOSE, CAPILLARY
Glucose-Capillary: 100 mg/dL — ABNORMAL HIGH (ref 70–99)
Glucose-Capillary: 108 mg/dL — ABNORMAL HIGH (ref 70–99)
Glucose-Capillary: 133 mg/dL — ABNORMAL HIGH (ref 70–99)
Glucose-Capillary: 134 mg/dL — ABNORMAL HIGH (ref 70–99)
Glucose-Capillary: 169 mg/dL — ABNORMAL HIGH (ref 70–99)
Glucose-Capillary: 86 mg/dL (ref 70–99)

## 2024-04-16 LAB — CBC
HCT: 28.3 % — ABNORMAL LOW (ref 36.0–46.0)
Hemoglobin: 9.4 g/dL — ABNORMAL LOW (ref 12.0–15.0)
MCH: 28.2 pg (ref 26.0–34.0)
MCHC: 33.2 g/dL (ref 30.0–36.0)
MCV: 85 fL (ref 80.0–100.0)
Platelets: 25 K/uL — CL (ref 150–400)
RBC: 3.33 MIL/uL — ABNORMAL LOW (ref 3.87–5.11)
RDW: 20.4 % — ABNORMAL HIGH (ref 11.5–15.5)
WBC: 5.8 K/uL (ref 4.0–10.5)
nRBC: 0 % (ref 0.0–0.2)

## 2024-04-16 LAB — PREPARE PLATELET PHERESIS
Unit division: 0
Unit division: 0
Unit division: 0

## 2024-04-16 LAB — BASIC METABOLIC PANEL WITH GFR
Anion gap: 9 (ref 5–15)
BUN: 30 mg/dL — ABNORMAL HIGH (ref 8–23)
CO2: 22 mmol/L (ref 22–32)
Calcium: 7.8 mg/dL — ABNORMAL LOW (ref 8.9–10.3)
Chloride: 101 mmol/L (ref 98–111)
Creatinine, Ser: 0.88 mg/dL (ref 0.44–1.00)
GFR, Estimated: >60 mL/min (ref >60)
Glucose, Bld: 104 mg/dL — ABNORMAL HIGH (ref 70–99)
Potassium: 3.9 mmol/L (ref 3.5–5.1)
Sodium: 132 mmol/L — ABNORMAL LOW (ref 135–145)

## 2024-04-16 LAB — BPAM RBC
Blood Product Expiration Date: 202512232359
ISSUE DATE / TIME: 202511231044
Unit Type and Rh: 5100

## 2024-04-16 MED ORDER — ENSURE PLUS HIGH PROTEIN PO LIQD: 237.0000 mL | Freq: Two times a day (BID) | ORAL | Status: DC

## 2024-04-16 MED ORDER — ALBUMIN HUMAN 5 % IV SOLN
12.5000 g | Freq: Four times a day (QID) | INTRAVENOUS | Status: AC
  Administered 2024-04-16 (×3): 12.5 g via INTRAVENOUS
  Filled 2024-04-16 (×3): qty 250

## 2024-04-16 MED ORDER — ACETAMINOPHEN 650 MG RE SUPP: 650.0000 mg | Freq: Four times a day (QID) | RECTAL | Status: DC | PRN

## 2024-04-16 MED ORDER — AMIODARONE LOAD VIA INFUSION
150.0000 mg | Freq: Once | INTRAVENOUS | Status: AC
  Administered 2024-04-16: 150 mg via INTRAVENOUS
  Filled 2024-04-16: qty 83.34

## 2024-04-16 MED ORDER — ACETAMINOPHEN 160 MG/5ML PO SOLN
650.0000 mg | Freq: Four times a day (QID) | ORAL | Status: DC | PRN
  Administered 2024-04-17 – 2024-04-21 (×3): 650 mg via ORAL
  Filled 2024-04-16 (×6): qty 20.3

## 2024-04-16 NOTE — Plan of Care (Signed)
  Problem: Education: Goal: Knowledge of General Education information will improve Description: Including pain rating scale, medication(s)/side effects and non-pharmacologic comfort measures Outcome: Progressing   Problem: Health Behavior/Discharge Planning: Goal: Ability to manage health-related needs will improve Outcome: Progressing   Problem: Clinical Measurements: Goal: Ability to maintain clinical measurements within normal limits will improve Outcome: Progressing Goal: Will remain free from infection Outcome: Progressing Goal: Diagnostic test results will improve Outcome: Progressing Goal: Respiratory complications will improve Outcome: Progressing Goal: Cardiovascular complication will be avoided Outcome: Progressing   Problem: Activity: Goal: Risk for activity intolerance will decrease Outcome: Progressing   Problem: Nutrition: Goal: Adequate nutrition will be maintained Outcome: Progressing   Problem: Coping: Goal: Level of anxiety will decrease Outcome: Progressing   Problem: Elimination: Goal: Will not experience complications related to bowel motility Outcome: Progressing Goal: Will not experience complications related to urinary retention Outcome: Progressing   Problem: Pain Managment: Goal: General experience of comfort will improve and/or be controlled Outcome: Progressing   Problem: Safety: Goal: Ability to remain free from injury will improve Outcome: Progressing   Problem: Skin Integrity: Goal: Risk for impaired skin integrity will decrease Outcome: Progressing   Cindy S. Loreli BSN, RN, CCRP, CCRN 04/16/2024 3:13 AM

## 2024-04-16 NOTE — Progress Notes (Signed)
 Patient refused food (stated she did not feel like eating/was not hungry). This nurse educated the patient on the importance of nutrition at this time; patient gave verbal understanding of eating small portions/meals throughout the day. Patient ordered/ate oatmeal.

## 2024-04-16 NOTE — Consult Note (Signed)
 Palliative Care Consult Note                                  Date: 04/16/2024   Patient Name: Nicole Johnson  DOB: 1961-03-15  MRN: [id number]  Age / Sex: 63 y.o., female  PCP: Tower, Laine DELENA, MD Referring Physician: Jude Harden GAILS, MD  Reason for Consultation: Establishing goals of care  Past Medical History:  Diagnosis Date   Arthritis    Cancer [facility])    pancreatic   GERD (gastroesophageal reflux disease)    PONV (postoperative nausea and vomiting)    Prediabetes     Subjective:   This NP [name] reviewed medical records, received report from team, assessed the patient and then meet at the patient's bedside to discuss diagnosis, prognosis, GOC, EOL wishes disposition and options.  Before meeting with the patient/family, I spent time reviewing the chart notes including PCCM note from yesterday, nursing notes from today, updated PCCM note from yesterday, SLP note from yesterday, PCCM note from today, nursing notes from today. I also reviewed vital signs, nursing flowsheets, medication administrations record, labs, and imaging. Labs reviewed include CBC which shows stable/improved hemoglobin at 9.4 today from 9.5 yesterday (after transfusion) and 6.8 today before; additionally platelet count is stable at 25 today from 29 yesterday and 22 today before all in the setting of metastatic pancreatic adenocarcinoma and pancytopenia with cirrhosis and ascites.  BMP shows hyponatremia stable at 132, BUN/creatinine stable/improving 30/0.88 with a peak creatinine of 1.14 few days ago in the setting of cirrhosis and pancreatic adenocarcinoma with liver metastasis.  I met with the patient at bedside, her husband was present.   We meet to discuss diagnosis prognosis, GOC, EOL wishes, disposition and options. Concept of Palliative Care was introduced as specialized medical care for people and their families living with serious illness.  If focuses on  providing relief from the symptoms and stress of a serious illness.  The goal is to improve quality of life for both the patient and the family. Values and goals of care important to patient and family were attempted to be elicited.  Created space and opportunity for patient  and family to explore thoughts and feelings regarding current medical situation   Natural trajectory and current clinical status were discussed. Questions and concerns addressed. Patient  encouraged to call with questions or concerns.    Patient/Family Understanding of Illness: Patient and she states that she can get her breathing right.  We explored specific topics around her chronic illness and acute presentation.  She states that they found her cancer in June or July 2023, sees [facility] and [facility].  She is planning to do chemo, her previously received dose was about 2-1/2 weeks ago and was scheduled to receive a dose last Friday but did not.  She has noted abdominal swelling for the past 3 months which was pretty sudden in onset for which she has been seeing Dr. Phebe with gastroenterology.  She is on medications to try to help with her swelling and confusion.  We spent time further dealing her chronic illness and acute presentation as well as clinical course since hospitalization.  I did not of her conversation the parents and husband understand severity of her illness.  Life Review: The patient and her husband Nicole Johnson niece, married for 43 years, just had an anniversary in October.  Her son lives with  them and is developmentally delayed but is fully employed at the high school he went to on the [employer].  They speak with him with much provide.  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  Things that bring the patient happiness outside of work family include working outside, especially in her flower beds.  Patient  Values: Quality of life and extending her life for as long as possible  Today's Discussion: In addition to discussions as well as extensive discussion of various topics.  In addition to reviewing her status in detail, including her chronic illness and progression of her cancer care, we also talked about her family life and quality of life.  She understands she is quite ill.  Spent time talking about the severity of her illness and possibility that she may not improve even with maximal medical treatment.  She continues to express her desire to see us  in the spring again where she can be in her flower beds.  We talked about her abdominal swelling and she notes that they have demanded a paracentesis repeat tomorrow.  You have been getting her cancer treatment at [facility] and it is not particularly fond of her hematologist who is helping to manage her thrombocytopenia.  She regularly receives NPlate  injections to help.  We talked about her chemotherapy and she states previously on chemotherapy she has felt poorly for couple days and a bounce back.  Her last 2 bouts of chemotherapy she has felt poorly afterward and continues to do so.  She attributes this to infections that she is common, most recently with poisoning.  She notes that her appetite has been very poor lately, although she feels a little better today.  Spent time talk about the importance of nutrition and our goal of strengthening and improvement.  We talked about the idea of eating to live and I encouraged her to try to eat small bits throughout the day, even if she is not primary, as long as she is not sick on her stomach.  She acknowledges this need.  1 complicating factor is odynophagia, which is felt to be due to reflux.  We talked about strategies to help mitigate this.  I do number conversation we talked about the severity of her illness again and the idea to take it a day at a time.  She states that even prior to this admission when she was  in the cancer center they recommended the same thing, acknowledging the severity of her cancer.  However, she notes that this hospitalization was from a kink into everything.  She again reiterates that she hopes to see another spring, noted that she previous told the ICU doctor that she hopes to see Christmas.   Finally we talked about CODE STATUS.  We discussed the statistical outcomes of cardiac resuscitation in patients with severe illness such as yourself.  At this point, the patient has significant emotional.  They indicated they would like to remain a full code and always do everything you can to prolong her life.  Husband knowledges that CPR may cause damage, but he uses through the lines of the alternative which is end-of-life.  We indicated that we would remain a full code, however I also noted that the medical team would likely continue to periodically ask these types of questions not to pressure them but to ensure that we are still doing what makes sense and what is desired.  They  acknowledged this and agreed.  I shared that I return tomorrow to check on the patient.  I provided contact card with our information for any questions or concerns while she remains admitted. I provided emotional and general support through therapeutic listening, empathy, sharing of stories, and other techniques. I answered all questions and addressed all concerns to the best of my ability.  Goals: Full code, full scope of care  Review of Systems  Constitutional:  Positive for appetite change and fatigue.  Respiratory:  Positive for shortness of breath (especially with tachycardia).   Gastrointestinal:  Positive for abdominal distention and abdominal pain. Negative for nausea and vomiting.  Neurological:  Positive for weakness.    Objective:   Primary Diagnoses: Present on Admission: **None**   Vital Signs:  BP 109/60   Pulse 72   Temp 97.7 F (36.5 C) (Oral)   Resp (!) 22   Ht 5' 5 (1.651 m)    Wt 62.7 kg   LMP 01/11/2011   SpO2 93%   BMI 23.00 kg/m   Physical Exam Vitals and nursing note reviewed.  Constitutional:      General: She is sleeping. She is not in acute distress. HENT:     Head: Normocephalic and atraumatic.  Cardiovascular:     Rate and Rhythm: Tachycardia present.     Comments: NSR during visit, did have an episode of tachycardia 130s for a few minutes Pulmonary:     Effort: Pulmonary effort is normal. No respiratory distress.  Abdominal:     General: There is distension.     Tenderness: There is abdominal tenderness (with palpation).     Comments: Abdomen firm but not hard  Skin:    General: Skin is warm.  Neurological:     General: No focal deficit present.     Mental Status: She is oriented to person, place, and time and easily aroused.  Psychiatric:        Mood and Affect: Mood normal.        Behavior: Behavior normal.     Palliative Assessment/Data: 40%   Assessment & Plan:   HPI/Patient Profile: 63 y.o. female  with past medical history of metastatic pancreatic adenocarcinoma to liver, pancytopenia, cirrhosis with ascites, GERD, presented with worsening of abdominal pain likely due to large ascites (LVP on 11/19, drained 4.2 L, improved abdominal pain) with hospital course c/b SVT requiring hemodynamic monitoring in ICU.  She is admitted on 04/11/2024 with shock likely mixed cardiogenic due to tachyarrhythmia with sepsis in the setting of peritonitis, A-fib with RVR, SBP versus secondary peritonitis status post paracentesis suggestive of SBP, acute respiratory failure with hypoxia, AKI, severe thrombocytopenia, normocytic anemia, and others.   Palliative medicine was consulted for GOC conversations.  SUMMARY OF RECOMMENDATIONS   Full code Full scope of care Ongoing supportive patient and family Palliative medicine will continue to follow  Symptom Management:  Per primary team Palliative medicine is available to assist in the  Code  Status: Full Code  Prognosis:  Unable to determine  Discharge Planning:  To Be Determined   Discussed with: Patient, family, medical team, nursing team    Thank you for allowing us  to participate in the care of Nicole Johnson PMT will continue to support holistically.  Time Total: 105 min  Detailed review of medical records (labs, imaging, vital signs), medically appropriate exam, discussed with treatment team, counseling and education to patient, family, & staff, documenting clinical information, medication management, coordination of care  Signed by:  [name], NP Palliative Medicine Team  Team Phone # [phone] (Nights/Weekends)  04/16/2024, 2:34 PM

## 2024-04-16 NOTE — Progress Notes (Signed)
 eLink Physician-Brief Progress Note Patient Name: Nicole Johnson DOB: 1961/02/23 MRN: [id number]   Date of Service  04/16/2024  HPI/Events of Note  Refractory afib with HR 130-140s  eICU Interventions  Rebolus amio per plan     Intervention Category Major Interventions: Arrhythmia - evaluation and management Intermediate Interventions: Arrhythmia - evaluation and management  Aaliyan Brinkmeier 04/16/2024, 12:58 AM

## 2024-04-16 NOTE — Progress Notes (Addendum)
 NAME:  Nicole Johnson, MRN:  [id number], DOB:  [birth year], LOS: 4 ADMISSION DATE:  04/11/2024, CONSULTATION DATE:  04/16/24 REFERRING MD:  Dr. Earley, CHIEF COMPLAINT:  A. Fib with RVR.   History of Present Illness:  63 y.o. female with medical history significant of metastatic pancreatic adenocarcinoma to liver, pancytopenia, cirrhosis with ascites, GERD, presented with worsening of abdominal pain likely due to large ascites (LVP on 11/19, drained 4.2 L, improved abdominal pain)  with hospital course c/b SVT requiring hemodynamic monitoring in ICU.  HPI: Patient had her routine chemotherapy last Wednesday and she has been feeling fine afterwards.  Friday, patient and her husband ate a roasted chicken for lunch and both became sick in the evening, their symptoms including abdominal pain nauseous vomiting, they attributed to possible food toxin from the chicken.  Husband however recovered the next day and the patient on the other hand continue to feel sick, she had 1 day of diarrhea and continued to have nauseous and vomiting and decreased appetite for last 3 to 4 days, she could only tolerate some Ensure, and became very dehydrated.  She denied any hematemesis or hematochezia.  Her abdominal pain mainly located on lower abdomen bilateral, constant since yesterday.  She denied any fever or chills.  She told me that she is scheduled to have platelet transfusion at [facility] today.   ED Course: Afebrile, borderline tachycardia blood pressure 92/60, O2 saturation 100% on room air.  CT abdomen pelvis showed no acute findings, moderate ascites.  Blood work showed INR 1.6, sodium 128 BUN 39 creatinine 1.0 glucose 132 albumin  2.3, WBC 9.8 hemoglobin 8.0 compared to baseline 8.2-9.6, platelet 25.  Pertinent  Medical History   Past Medical History:  Diagnosis Date   Arthritis    Cancer (HCC)    pancreatic   GERD (gastroesophageal reflux disease)    PONV (postoperative nausea and vomiting)     Prediabetes    Significant Hospital Events: Including procedures, antibiotic start and stop dates in addition to other pertinent events   11/19 --> admitted, large volume paracentesis 4.2 L drained 11/22 --> SVT, given adenosine  6 mg and 12 mg, did not abort, came down to ICU for DCCV. Given amio bolus and infusion, came out of a. Fib into NSR. 11/23 --> overnight developed another episode of a. Fib with RVR, given another bolus of amio, back to NSR. Hgb down to 6.8, 1 unit of PRBCs ordered. 11/24 rebolus Amio for RVR  Interim History / Subjective:  Remains critically ill , on higher doses of neo after rebolus of Amio this morning Now converted back to sinus rhythm Denies chest pain or abdominal pain Afebrile , adequate urine output  Lines/Tubes: R chest port, PIVs, external urinary device, biliary stent  Objective    Blood pressure (!) 97/57, pulse 73, temperature 98.1 F (36.7 C), temperature source Oral, resp. rate 13, height 5' 5 (1.651 m), weight 62.7 kg, last menstrual period 01/11/2011, SpO2 96%.        Intake/Output Summary (Last 24 hours) at 04/16/2024 0855 Last data filed at 04/16/2024 0716 Gross per 24 hour  Intake 2392.5 ml  Output 1125 ml  Net 1267.5 ml   Filed Weights   04/11/24 1744  Weight: 62.7 kg    Examination: General: cachectic, chronically ill appearing Eyes: PERRL, no scleral icterus ENMT: oropharynx clear, good dentition, no oral lesions, mallampati score I Skin: warm, intact, no rashes Neck: Elevated JVD, no lymphadenopathy CV: RRR, no MRG, nl S1  and S2, 2+ pitting peripheral edema Resp: Decreased breath sounds bilateral Abdom: + Fluid thrill, soft, nontender Neuro: Awake alert oriented to person place time and situation  Labs show hyponatremia 132, no leukocytosis, severe chronic thrombocytopenia 25K, stable anemia  Resolved problem list   Assessment and Plan   #Shock: likely mixed cardiogenic due to tachyarrhythmia and septic in the  setting of peritonitis. #Atrial Fibrillation with RVR: CHADS2VASC is 1.  Echo shows normal LVEF, mildly elevated RVSP - Continue amiodarone  infusion, bolus amio 150 as needed for RVR - Holding off on anticoagulation due to low chads2vasc and severe thrombocytopenia - Phenylephrine  drip, titrate to MAP 65 -increasing doses may be due to Amio bolus versus third spacing - Continue midodrine  10 mg TID   #SBP vs. Secondary peritonitis: paracentesis yielded 1610 leukocytes, PMN 84%  This is c/w with diagnosis of spontaneous bacterial peritonitis versus secondary peritonitis. Prior history of percutaneous cholecystostomy 06/2023 for perforated cholecystitis and Klebsiella bacteremia 02/2024 Malignant ascites status post large-volume paracentesis on 11/6 and 11/20 - Continue Ceftriaxone  -  peritoneal cultures negative so far - Evaluate need for repeat paracentesis   #Stress Ulcer PPx: continue PPI    #AKI: likely pre-renal due to shock state, resolved - Avoid Nephrotoxins - Strict I/Os   #Severe Thrombocytopenia #Normocytic Anemia > Pancytopenia likely due to bone marrow suppression from malignancy/chemoRx. - Transfuse PLT for goal > 10 or 50K for procedures - Transfuse Hgb for goal > 7 mg/dL  Disposition: ICU appropriate for hemodynamic monitoring and vasopressors. Will involve palliative care.  I had a brief discussion with patient and her husband at bedside.  They have never had detailed discussion about advanced life support measures.  They indicated that they would like to see another Christmas.  Labs   CBC: Recent Labs  Lab 04/14/24 0320 04/14/24 1201 04/15/24 0459 04/15/24 1422 04/16/24 0445  WBC 5.1 7.4 4.6 8.5 5.8  NEUTROABS  --   --   --  7.7  --   HGB 8.3* 8.4* 6.8* 9.5* 9.4*  HCT 25.5* 25.1* 21.3* 29.2* 28.3*  MCV 87.0 86.9 88.4 87.2 85.0  PLT 15* 18* 22* 29* 25*    Basic Metabolic Panel: Recent Labs  Lab 04/12/24 0813 04/13/24 0020 04/14/24 1201  04/15/24 0459 04/16/24 0445  NA 131* 129* 132* 132* 132*  K 4.6 4.3 4.2 3.7 3.9  CL 100 99 101 100 101  CO2 22 22 21* 23 22  GLUCOSE 96 111* 139* 136* 104*  BUN 36* 35* 38* 38* 30*  CREATININE 1.12* 1.14* 1.10* 0.98 0.88  CALCIUM 8.4* 7.7* 7.9* 7.8* 7.8*  MG  --   --   --  2.1  --   PHOS  --   --   --  2.6  --    GFR: Estimated Creatinine Clearance: 59.6 mL/min (by C-G formula based on SCr of 0.88 mg/dL). Recent Labs  Lab 04/14/24 1200 04/14/24 1201 04/14/24 1442 04/15/24 0459 04/15/24 1422 04/16/24 0445  WBC  --  7.4  --  4.6 8.5 5.8  LATICACIDVEN 2.6*  --  1.8  --   --   --     Liver Function Tests: Recent Labs  Lab 04/11/24 0924 04/12/24 0813 04/14/24 1201 04/15/24 0459  AST 24 22 45* 34  ALT 27 19 24 19   ALKPHOS 245* 147* 135* 94  BILITOT 1.5* 1.8* 3.2* 2.6*  PROT 5.1* 4.9* 4.8* 4.8*  ALBUMIN  2.3* 2.9* 2.3* 3.0*   Recent Labs  Lab 04/11/24 9075  LIPASE <10*   Recent Labs  Lab 04/12/24 0813  AMMONIA 31    ABG    Component Value Date/Time   TCO2 23 10/08/2023 1227     Coagulation Profile: Recent Labs  Lab 04/11/24 0924 04/12/24 0813 04/13/24 0020  INR 1.6* 1.9* 1.7*    Cardiac Enzymes: No results for input(s): CKTOTAL, CKMB, CKMBINDEX, TROPONINI in the last 168 hours.  HbA1C: Hgb A1c MFr Bld  Date/Time Value Ref Range Status  04/15/2024 04:59 AM 6.1 (H) 4.8 - 5.6 % Final    Comment:    (NOTE) Diagnosis of Diabetes The following HbA1c ranges recommended by the American Diabetes Association (ADA) may be used as an aid in the diagnosis of diabetes mellitus.  Hemoglobin             Suggested A1C NGSP%              Diagnosis  <5.7                   Non Diabetic  5.7-6.4                Pre-Diabetic  >6.4                   Diabetic  <7.0                   Glycemic control for                       adults with diabetes.    03/14/2023 09:09 AM 6.2 4.6 - 6.5 % Final    Comment:    Glycemic Control Guidelines for People  with Diabetes:Non Diabetic:  <6%Goal of Therapy: <7%Additional Action Suggested:  >8%     CBG: Recent Labs  Lab 04/15/24 1607 04/15/24 2042 04/15/24 2345 04/16/24 0450 04/16/24 0804  GLUCAP 126* 110* 121* 100* 108*     My independent critical care time was 33 minutes  Harden Staff MD. FCCP. [facility] Pager : [phone]  If no response to pager , please call [phone] until 7 pm After 7:00 pm call Elink  [phone]   04/16/2024

## 2024-04-17 ENCOUNTER — Inpatient Hospital Stay (HOSPITAL_COMMUNITY)

## 2024-04-17 DIAGNOSIS — K529 Noninfective gastroenteritis and colitis, unspecified: Secondary | ICD-10-CM | POA: Diagnosis not present

## 2024-04-17 DIAGNOSIS — D696 Thrombocytopenia, unspecified: Secondary | ICD-10-CM | POA: Diagnosis not present

## 2024-04-17 DIAGNOSIS — Z7189 Other specified counseling: Secondary | ICD-10-CM | POA: Diagnosis not present

## 2024-04-17 DIAGNOSIS — J9601 Acute respiratory failure with hypoxia: Secondary | ICD-10-CM

## 2024-04-17 DIAGNOSIS — Z515 Encounter for palliative care: Secondary | ICD-10-CM | POA: Diagnosis not present

## 2024-04-17 LAB — BLOOD GAS, ARTERIAL
Acid-base deficit: 7.8 mmol/L — ABNORMAL HIGH (ref 0.0–2.0)
Acid-base deficit: 7.5 mmol/L — ABNORMAL HIGH (ref 0.0–2.0)
Bicarbonate: 21 mmol/L (ref 20.0–28.0)
Bicarbonate: 20.1 mmol/L (ref 20.0–28.0)
Drawn by: 75061
Drawn by: 31394
FIO2: 100 %
FIO2: 60 %
MECHVT: 450 mL
MECHVT: 450 mL
O2 Saturation: 100 %
O2 Saturation: 99.6 %
PEEP: 10 cmH2O
PEEP: 10 cmH2O
Patient temperature: 35.7
Patient temperature: 35.3
RATE: 16 {breaths}/min
RATE: 24 {breaths}/min
pCO2 arterial: 56 mmHg — ABNORMAL HIGH (ref 32–48)
pCO2 arterial: 45 mmHg (ref 32–48)
pH, Arterial: 7.18 — CL (ref 7.35–7.45)
pH, Arterial: 7.25 — ABNORMAL LOW (ref 7.35–7.45)
pO2, Arterial: 307 mmHg — ABNORMAL HIGH (ref 83–108)
pO2, Arterial: 115 mmHg — ABNORMAL HIGH (ref 83–108)

## 2024-04-17 LAB — CBC WITH DIFFERENTIAL/PLATELET
Abs Immature Granulocytes: 0.09 K/uL — ABNORMAL HIGH (ref 0.00–0.07)
Basophils Absolute: 0 K/uL (ref 0.0–0.1)
Basophils Relative: 1 %
Eosinophils Absolute: 0 K/uL (ref 0.0–0.5)
Eosinophils Relative: 0 %
HCT: 29.9 % — ABNORMAL LOW (ref 36.0–46.0)
Hemoglobin: 9.8 g/dL — ABNORMAL LOW (ref 12.0–15.0)
Immature Granulocytes: 2 %
Lymphocytes Relative: 2 %
Lymphs Abs: 0.1 K/uL — ABNORMAL LOW (ref 0.7–4.0)
MCH: 27.8 pg (ref 26.0–34.0)
MCHC: 32.8 g/dL (ref 30.0–36.0)
MCV: 84.7 fL (ref 80.0–100.0)
Monocytes Absolute: 0.2 K/uL (ref 0.1–1.0)
Monocytes Relative: 3 %
Neutro Abs: 5 K/uL (ref 1.7–7.7)
Neutrophils Relative %: 92 %
Platelets: 27 K/uL — CL (ref 150–400)
RBC: 3.53 MIL/uL — ABNORMAL LOW (ref 3.87–5.11)
RDW: 20.8 % — ABNORMAL HIGH (ref 11.5–15.5)
WBC: 5.4 K/uL (ref 4.0–10.5)
nRBC: 0 % (ref 0.0–0.2)

## 2024-04-17 LAB — BASIC METABOLIC PANEL WITH GFR
Anion gap: 10 (ref 5–15)
BUN: 27 mg/dL — ABNORMAL HIGH (ref 8–23)
CO2: 21 mmol/L — ABNORMAL LOW (ref 22–32)
Calcium: 8.1 mg/dL — ABNORMAL LOW (ref 8.9–10.3)
Chloride: 100 mmol/L (ref 98–111)
Creatinine, Ser: 0.87 mg/dL (ref 0.44–1.00)
GFR, Estimated: >60 mL/min (ref >60)
Glucose, Bld: 116 mg/dL — ABNORMAL HIGH (ref 70–99)
Potassium: 4 mmol/L (ref 3.5–5.1)
Sodium: 130 mmol/L — ABNORMAL LOW (ref 135–145)

## 2024-04-17 LAB — BODY FLUID CELL COUNT WITH DIFFERENTIAL
Eos, Fluid: 1 %
Lymphs, Fluid: 13 %
Monocyte-Macrophage-Serous Fluid: 25 % — ABNORMAL LOW (ref 50–90)
Neutrophil Count, Fluid: 61 % — ABNORMAL HIGH (ref 0–25)
Total Nucleated Cell Count, Fluid: 91 uL (ref 0–1000)

## 2024-04-17 LAB — GLUCOSE, CAPILLARY
Glucose-Capillary: 110 mg/dL — ABNORMAL HIGH (ref 70–99)
Glucose-Capillary: 91 mg/dL (ref 70–99)
Glucose-Capillary: 114 mg/dL — ABNORMAL HIGH (ref 70–99)
Glucose-Capillary: 147 mg/dL — ABNORMAL HIGH (ref 70–99)
Glucose-Capillary: 194 mg/dL — ABNORMAL HIGH (ref 70–99)
Glucose-Capillary: 188 mg/dL — ABNORMAL HIGH (ref 70–99)

## 2024-04-17 LAB — AEROBIC/ANAEROBIC CULTURE W GRAM STAIN (SURGICAL/DEEP WOUND): Culture: NO GROWTH

## 2024-04-17 LAB — PATHOLOGIST SMEAR REVIEW: Path Review: REACTIVE

## 2024-04-17 LAB — PHOSPHORUS: Phosphorus: 2.3 mg/dL — ABNORMAL LOW (ref 2.5–4.6)

## 2024-04-17 LAB — MAGNESIUM: Magnesium: 1.9 mg/dL (ref 1.7–2.4)

## 2024-04-17 MED ORDER — ROCURONIUM BROMIDE 10 MG/ML (PF) SYRINGE
PREFILLED_SYRINGE | INTRAVENOUS | Status: AC
  Administered 2024-04-17: 50 mg via INTRAVENOUS
  Filled 2024-04-17: qty 10

## 2024-04-17 MED ORDER — DOCUSATE SODIUM 50 MG/5ML PO LIQD
100.0000 mg | Freq: Two times a day (BID) | ORAL | Status: DC
  Administered 2024-04-17 – 2024-04-23 (×13): 100 mg
  Filled 2024-04-17 (×13): qty 10

## 2024-04-17 MED ORDER — NOREPINEPHRINE 4 MG/250ML-% IV SOLN
INTRAVENOUS | Status: AC
  Administered 2024-04-17: 5 ug/min via INTRAVENOUS
  Filled 2024-04-17: qty 250

## 2024-04-17 MED ORDER — SODIUM CHLORIDE 0.9% IV SOLUTION: Freq: Once | INTRAVENOUS | Status: AC

## 2024-04-17 MED ORDER — MIDAZOLAM HCL (PF) 2 MG/2ML IJ SOLN
1.0000 mg | INTRAMUSCULAR | Status: DC | PRN
  Administered 2024-04-19 – 2024-04-21 (×7): 2 mg via INTRAVENOUS
  Administered 2024-04-22: 1 mg via INTRAVENOUS
  Filled 2024-04-17 (×10): qty 2

## 2024-04-17 MED ORDER — K PHOS MONO-SOD PHOS DI & MONO 155-852-130 MG PO TABS
500.0000 mg | ORAL_TABLET | ORAL | Status: DC
  Administered 2024-04-17: 500 mg via ORAL
  Filled 2024-04-17: qty 2

## 2024-04-17 MED ORDER — ETOMIDATE 2 MG/ML IV SOLN
INTRAVENOUS | Status: AC
  Administered 2024-04-17: 20 mg via INTRAVENOUS
  Filled 2024-04-17: qty 20

## 2024-04-17 MED ORDER — MIDODRINE HCL 5 MG PO TABS
10.0000 mg | ORAL_TABLET | Freq: Three times a day (TID) | ORAL | Status: DC
  Administered 2024-04-17 – 2024-04-26 (×26): 10 mg
  Filled 2024-04-17 (×27): qty 2

## 2024-04-17 MED ORDER — FENTANYL BOLUS VIA INFUSION
25.0000 ug | INTRAVENOUS | Status: DC | PRN
  Administered 2024-04-17 – 2024-04-18 (×11): 100 ug via INTRAVENOUS
  Administered 2024-04-19 – 2024-04-20 (×5): 50 ug via INTRAVENOUS
  Administered 2024-04-20: 100 ug via INTRAVENOUS
  Administered 2024-04-21 (×3): 50 ug via INTRAVENOUS
  Administered 2024-04-21: 100 ug via INTRAVENOUS
  Administered 2024-04-21: 50 ug via INTRAVENOUS
  Administered 2024-04-21: 100 ug via INTRAVENOUS

## 2024-04-17 MED ORDER — PHENYLEPHRINE 80 MCG/ML (10ML) SYRINGE FOR IV PUSH (FOR BLOOD PRESSURE SUPPORT)
PREFILLED_SYRINGE | INTRAVENOUS | Status: AC
  Filled 2024-04-17: qty 10

## 2024-04-17 MED ORDER — POTASSIUM & SODIUM PHOSPHATES 280-160-250 MG PO PACK
1.0000 | PACK | Freq: Once | ORAL | Status: AC
  Administered 2024-04-17: 1
  Filled 2024-04-17: qty 1

## 2024-04-17 MED ORDER — NOREPINEPHRINE 4 MG/250ML-% IV SOLN
0.0000 ug/min | INTRAVENOUS | Status: DC
  Administered 2024-04-17: 14 ug/min via INTRAVENOUS
  Administered 2024-04-18: 16 ug/min via INTRAVENOUS
  Administered 2024-04-18: 18 ug/min via INTRAVENOUS
  Administered 2024-04-18 (×2): 23 ug/min via INTRAVENOUS
  Administered 2024-04-18: 20 ug/min via INTRAVENOUS
  Administered 2024-04-18: 18 ug/min via INTRAVENOUS
  Administered 2024-04-19: 10 ug/min via INTRAVENOUS
  Administered 2024-04-19: 16 ug/min via INTRAVENOUS
  Administered 2024-04-19: 18 ug/min via INTRAVENOUS
  Administered 2024-04-20: 3 ug/min via INTRAVENOUS
  Administered 2024-04-21: 2 ug/min via INTRAVENOUS
  Filled 2024-04-17 (×13): qty 250

## 2024-04-17 MED ORDER — ALBUMIN HUMAN 5 % IV SOLN
12.5000 g | Freq: Once | INTRAVENOUS | Status: AC
  Administered 2024-04-17: 12.5 g via INTRAVENOUS
  Filled 2024-04-17: qty 250

## 2024-04-17 MED ORDER — ORAL CARE MOUTH RINSE
15.0000 mL | OROMUCOSAL | Status: DC
  Administered 2024-04-17 – 2024-04-20 (×31): 15 mL via OROMUCOSAL

## 2024-04-17 MED ORDER — ROCURONIUM BROMIDE 10 MG/ML (PF) SYRINGE: 50.0000 mg | PREFILLED_SYRINGE | Freq: Once | INTRAVENOUS | Status: AC

## 2024-04-17 MED ORDER — FUROSEMIDE 10 MG/ML IJ SOLN
40.0000 mg | Freq: Once | INTRAMUSCULAR | Status: AC
  Administered 2024-04-17: 40 mg via INTRAVENOUS
  Filled 2024-04-17: qty 4

## 2024-04-17 MED ORDER — ALBUMIN HUMAN 25 % IV SOLN
INTRAVENOUS | Status: AC
  Administered 2024-04-17: 25 g via INTRAVENOUS
  Filled 2024-04-17: qty 100

## 2024-04-17 MED ORDER — POLYETHYLENE GLYCOL 3350 17 G PO PACK
17.0000 g | PACK | Freq: Every day | ORAL | Status: DC
  Administered 2024-04-17 – 2024-04-20 (×4): 17 g
  Filled 2024-04-17 (×4): qty 1

## 2024-04-17 MED ORDER — FENTANYL CITRATE (PF) 100 MCG/2ML IJ SOLN
INTRAMUSCULAR | Status: AC
  Administered 2024-04-17: 100 ug via INTRAVENOUS
  Filled 2024-04-17: qty 2

## 2024-04-17 MED ORDER — POTASSIUM & SODIUM PHOSPHATES 280-160-250 MG PO PACK
1.0000 | PACK | Freq: Once | ORAL | Status: DC
  Filled 2024-04-17: qty 1

## 2024-04-17 MED ORDER — ALBUMIN HUMAN 25 % IV SOLN: 25.0000 g | Freq: Once | INTRAVENOUS | Status: AC

## 2024-04-17 MED ORDER — MAGNESIUM SULFATE 2 GM/50ML IV SOLN
2.0000 g | Freq: Once | INTRAVENOUS | Status: AC
  Administered 2024-04-17: 2 g via INTRAVENOUS
  Filled 2024-04-17: qty 50

## 2024-04-17 MED ORDER — ETOMIDATE 2 MG/ML IV SOLN: 20.0000 mg | Freq: Once | INTRAVENOUS | Status: AC

## 2024-04-17 MED ORDER — FENTANYL CITRATE (PF) 100 MCG/2ML IJ SOLN: 100.0000 ug | Freq: Once | INTRAMUSCULAR | Status: AC

## 2024-04-17 MED ORDER — FENTANYL 2500MCG IN NS 250ML (10MCG/ML) PREMIX INFUSION
0.0000 ug/h | INTRAVENOUS | Status: DC
  Administered 2024-04-17: 250 ug/h via INTRAVENOUS
  Administered 2024-04-18 – 2024-04-19 (×3): 275 ug/h via INTRAVENOUS
  Administered 2024-04-19: 75 ug/h via INTRAVENOUS
  Administered 2024-04-21: 175 ug/h via INTRAVENOUS
  Filled 2024-04-17 (×7): qty 250

## 2024-04-17 MED ORDER — ORAL CARE MOUTH RINSE
15.0000 mL | OROMUCOSAL | Status: DC | PRN
Start: 2024-04-17 — End: 2024-04-20

## 2024-04-17 MED ORDER — FENTANYL CITRATE (PF) 50 MCG/ML IJ SOSY
25.0000 ug | PREFILLED_SYRINGE | Freq: Once | INTRAMUSCULAR | Status: AC
  Administered 2024-04-17: 50 ug via INTRAVENOUS

## 2024-04-17 MED ORDER — FENTANYL 2500MCG IN NS 250ML (10MCG/ML) PREMIX INFUSION
INTRAVENOUS | Status: AC
  Administered 2024-04-17: 50 ug/h via INTRAVENOUS
  Filled 2024-04-17: qty 250

## 2024-04-17 MED ORDER — PANTOPRAZOLE SODIUM 40 MG IV SOLR
40.0000 mg | Freq: Two times a day (BID) | INTRAVENOUS | Status: DC
  Administered 2024-04-17 – 2024-04-27 (×21): 40 mg via INTRAVENOUS
  Filled 2024-04-17 (×21): qty 10

## 2024-04-17 MED ORDER — THIAMINE HCL 100 MG/ML IJ SOLN
100.0000 mg | Freq: Every day | INTRAMUSCULAR | Status: DC
  Administered 2024-04-17 – 2024-04-26 (×10): 100 mg via INTRAVENOUS
  Filled 2024-04-17 (×11): qty 2

## 2024-04-17 MED ORDER — SODIUM BICARBONATE 8.4 % IV SOLN
50.0000 meq | Freq: Once | INTRAVENOUS | Status: AC
  Administered 2024-04-17: 50 meq via INTRAVENOUS
  Filled 2024-04-17: qty 50

## 2024-04-17 MED ORDER — VASOPRESSIN 20 UNITS/100 ML INFUSION FOR SHOCK
0.0000 [IU]/min | INTRAVENOUS | Status: DC
  Administered 2024-04-18 – 2024-04-21 (×8): 0.03 [IU]/min via INTRAVENOUS
  Filled 2024-04-17 (×8): qty 100

## 2024-04-17 NOTE — Plan of Care (Signed)
  Problem: Education: Goal: Knowledge of General Education information will improve Description: Including pain rating scale, medication(s)/side effects and non-pharmacologic comfort measures Outcome: Progressing   Problem: Health Behavior/Discharge Planning: Goal: Ability to manage health-related needs will improve Outcome: Progressing   Problem: Clinical Measurements: Goal: Ability to maintain clinical measurements within normal limits will improve Outcome: Progressing Goal: Will remain free from infection Outcome: Progressing Goal: Diagnostic test results will improve Outcome: Progressing Goal: Cardiovascular complication will be avoided Outcome: Progressing   Problem: Activity: Goal: Risk for activity intolerance will decrease Outcome: Progressing   Problem: Nutrition: Goal: Adequate nutrition will be maintained Outcome: Progressing   Problem: Coping: Goal: Level of anxiety will decrease Outcome: Progressing   Problem: Elimination: Goal: Will not experience complications related to urinary retention Outcome: Progressing   Problem: Pain Managment: Goal: General experience of comfort will improve and/or be controlled Outcome: Progressing   Problem: Safety: Goal: Ability to remain free from injury will improve Outcome: Progressing   Problem: Skin Integrity: Goal: Risk for impaired skin integrity will decrease Outcome: Progressing   Problem: Education: Goal: Ability to describe self-care measures that may prevent or decrease complications (Diabetes Survival Skills Education) will improve Outcome: Progressing Goal: Individualized Educational Video(s) Outcome: Progressing   Problem: Coping: Goal: Ability to adjust to condition or change in health will improve Outcome: Progressing   Problem: Health Behavior/Discharge Planning: Goal: Ability to identify and utilize available resources and services will improve Outcome: Progressing Goal: Ability to manage  health-related needs will improve Outcome: Progressing   Problem: Metabolic: Goal: Ability to maintain appropriate glucose levels will improve Outcome: Progressing   Problem: Skin Integrity: Goal: Risk for impaired skin integrity will decrease Outcome: Progressing   Problem: Tissue Perfusion: Goal: Adequacy of tissue perfusion will improve Outcome: Progressing   Problem: Activity: Goal: Ability to tolerate increased activity will improve Outcome: Progressing   Problem: Respiratory: Goal: Ability to maintain a clear airway and adequate ventilation will improve Outcome: Progressing   Problem: Role Relationship: Goal: Method of communication will improve Outcome: Progressing   Problem: Clinical Measurements: Goal: Respiratory complications will improve Outcome: Not Progressing   Problem: Elimination: Goal: Will not experience complications related to bowel motility Outcome: Not Progressing   Problem: Fluid Volume: Goal: Ability to maintain a balanced intake and output will improve Outcome: Not Progressing   Problem: Nutritional: Goal: Maintenance of adequate nutrition will improve Outcome: Not Progressing Goal: Progress toward achieving an optimal weight will improve Outcome: Not Progressing

## 2024-04-17 NOTE — Plan of Care (Signed)
   Problem: Health Behavior/Discharge Planning: Goal: Ability to manage health-related needs will improve Outcome: Progressing   Problem: Clinical Measurements: Goal: Will remain free from infection Outcome: Progressing   Problem: Clinical Measurements: Goal: Respiratory complications will improve Outcome: Progressing

## 2024-04-17 NOTE — Progress Notes (Signed)
 Patient grimacing. Fent 100mcg bolus given. Fent increased 100 mcg/hr per [facility] PA. Patient still grimacing. Bolus of 100mcg fent given. Fent increased to 150mcg. Patient still grimacing and restless. Fent increased to 250mcg per PA. Patient stable. RASS -2

## 2024-04-17 NOTE — Progress Notes (Signed)
 Per Norleen Cedar PA-C, increased Neo to 100mcg/hr for paracentesis.

## 2024-04-17 NOTE — Progress Notes (Addendum)
   04/17/24 1800  Spiritual Encounters  Type of Visit Initial  Care provided to: [facility] partners present during encounter Nurse  Referral source Chaplain assessment  Reason for visit Urgent spiritual support  OnCall Visit No   Reviewed notes from earlier today and MEWS score; wanted to offer support to family.  Spoke with Kayla, RN to gain further insight into care decisions today and possible needs. Met spouse, Kiki, Mrs. Moragne's sister and family outside of ICU. They welcomed my visit. Calvin debriefed around progress of care, need for intubation today, and frustrations around code status conversation. He feels clear in goals of care and code status at this time; understands prognosis but wants to pursue all life lengthening opportuities. He shared that he will have challenging conversation with Donnice, adult son with some cognitive delay.  I provided some normalization of emotion and affirmed goals of care decisions. Provided active and reflective listening. I understood Sherlean faith outlook of family and desire for ongoing prayer as a source of strength and meaning. Encouraged Kiki to consider utilizing spiritual care team to also help Donnice cope. Encouraged rest and self-care; focus on the present. Will request chaplain team to offer follow up 11/26.  Aubery Douthat L. Delores HERO.Div

## 2024-04-17 NOTE — Procedures (Signed)
 Intubation Procedure Note  Nicole Johnson  [id number]  1960-11-02  Date:04/17/24  Time:1:45 PM   Provider Performing:Jammy Stlouis V. Royann Wildasin    Procedure: Intubation (31500)  Indication(s) Respiratory Failure  Consent Risks of the procedure as well as the alternatives and risks of each were explained to the patient and/or caregiver.  Consent for the procedure was obtained and is signed in the bedside chart   Anesthesia Etomidate , Fentanyl , and Rocuronium    Time Out Verified patient identification, verified procedure, site/side was marked, verified correct patient position, special equipment/implants available, medications/allergies/relevant history reviewed, required imaging and test results available.   Sterile Technique Usual hand hygeine, masks, and gloves were used   Procedure Description Patient positioned in bed supine.  Sedation given as noted above.  Patient was intubated with endotracheal tube using Glidescope.  View was Grade 1 full glottis .  Number of attempts was 1.  Colorimetric CO2 detector was consistent with tracheal placement.   Complications/Tolerance None; patient tolerated the procedure well. Chest X-ray is ordered to verify placement. Hypotension treated with increasing neo gtt  EBL Minimal   Specimen(s) None  Kongmeng Santoro V. Jude MD

## 2024-04-17 NOTE — Procedures (Signed)
 Paracentesis Procedure Note  LIAM BOSSMAN  [id number]  1960-10-04  Date:04/17/24  Time:10:56 AM   Provider Performing:Avery Klingbeil D Emilio    Procedure: Paracentesis with imaging guidance (50916)  Indication(s) Ascites  Consent Risks of the procedure as well as the alternatives and risks of each were explained to the patient and/or caregiver.  Consent for the procedure was obtained and is signed in the bedside chart  Anesthesia Topical only with 1% lidocaine     Time Out Verified patient identification, verified procedure, site/side was marked, verified correct patient position, special equipment/implants available, medications/allergies/relevant history reviewed, required imaging and test results available.   Sterile Technique Maximal sterile technique including full sterile barrier drape, hand hygiene, sterile gown, sterile gloves, mask, hair covering, sterile ultrasound probe cover (if used).   Procedure Description Ultrasound used to identify appropriate peritoneal anatomy for placement and overlying skin marked.  Area of drainage cleaned and draped in sterile fashion. Lidocaine  was used to anesthetize the skin and subcutaneous tissue.  2500 cc's of yellow/clear appearing fluid was drained. Catheter then removed and bandaid applied to site.   Complications/Tolerance None; patient tolerated the procedure well.   EBL Minimal   Specimen(s) Peritoneal fluid  JD Emilio RIGGERS [facility] 04/17/2024, 10:57 AM  Please see [email or web address] for pager details.  From 7A-7P if no response, please call [phone]. After hours, please call ELink [phone].

## 2024-04-17 NOTE — Progress Notes (Signed)
 Daily Progress Note   Date: 04/17/2024   Patient Name: Nicole Johnson  DOB: 04/19/1961  MRN: [id number]  Age / Sex: 63 y.o., female  Attending Physician: Jude Harden GAILS, MD Primary Care Physician: Tower, Laine DELENA, MD Admit Date: 04/11/2024 Length of Stay: 5 days  Reason for Follow-up: Establishing goals of care  Past Medical History:  Diagnosis Date   Arthritis    Cancer [facility])    pancreatic   GERD (gastroesophageal reflux disease)    PONV (postoperative nausea and vomiting)    Prediabetes     Subjective:   Subjective: Chart Reviewed. Updates received. Patient Assessed. Created space and opportunity for patient  and family to explore thoughts and feelings regarding current medical situation.  Today's Discussion: Today before meeting with the patient/family, I reviewed the chart notes including nursing note from today, PCCM note from today, PCCM procedure note from today. I also reviewed vital signs, nursing flowsheets, medication administrations record, labs, and imaging. Labs reviewed include CBC which shows stable/improved hemoglobin at 9.8 today from a low of 6.8; additionally platelet count is stable at 27 today all in the setting of metastatic pancreatic adenocarcinoma and pancytopenia with cirrhosis and ascites.  BMP shows hyponatremia stable but persistently low at 130, BUN/creatinine stable/improving 27/0.87 with a peak creatinine of 1.14 few days ago in the setting of cirrhosis and pancreatic adenocarcinoma with liver metastasis.  Today prior to seeing the patient I met with her husband in the hallway.  He was waiting outside of the ICU as they were doing a paracentesis.  He spent a significant amount of time discussing his understanding of her poor prognosis and likely poor outcomes with resuscitation.  He expresses frustration that the medical team continues to push him to except DNR status.  He is getting quite emotional about this and is wanting them to stop telling him every  time they come in the room that she is not doing well and would not do well with resuscitation.  He expresses his understanding but a desire to continue full code and full scope of care.  He has talked about this with his wife and she is on the same page.  Shortly after we began talking we are allowed to go back in the room as paracentesis was completed.  The patient is lying in the bed, her dose of phenylephrine  is increased because of paracentesis and blood pressures are stable.  Her saturations had declined overnight likely due to increased intra-abdominal volume.  She is now on a nonrebreather mask and satting 88%, although she does appear slouched down in the bed.  She states that she feels a bit better after the fluid has been removed, is having some shortness of breath ongoing.  I shared the palliative medicine understands her goals for full code and full scope of care we will continue to follow and support in the coming days.  After seeing the patient I met with the bedside nurse and requested if they could try to set her up on the bed to allow further expansion of her lungs.  I also discussed patient and family's frustration with ongoing discussions about poor prognosis and likely poor outcomes with resuscitation, to the point of frustration bordering on anger.  I provided emotional and general support through therapeutic listening, empathy, sharing of stories, and other techniques. I answered all questions and addressed all concerns to the best of my ability.  Review of Systems  Respiratory:  Positive for shortness of breath (Currently  on NRB).   Cardiovascular:  Negative for chest pain.  Gastrointestinal:  Negative for abdominal pain, nausea and vomiting.    Objective:   Primary Diagnoses: Present on Admission: **None**   Vital Signs:  BP (!) 88/51   Pulse 79   Temp (!) 97.5 F (36.4 C) (Axillary)   Resp (!) 27   Ht 5' 5 (1.651 m)   Wt 62.7 kg   LMP 01/11/2011   SpO2 (!) 89%    BMI 23.00 kg/m   Physical Exam Vitals and nursing note reviewed.  Constitutional:      General: She is not in acute distress.    Appearance: She is ill-appearing.  HENT:     Head: Normocephalic and atraumatic.  Cardiovascular:     Rate and Rhythm: Normal rate.  Pulmonary:     Effort: Pulmonary effort is normal. No respiratory distress.     Breath sounds: No wheezing or rhonchi.     Comments: NRB in place Abdominal:     General: Abdomen is flat.     Palpations: Abdomen is soft.  Skin:    General: Skin is warm and dry.  Neurological:     General: No focal deficit present.     Mental Status: She is alert.  Psychiatric:        Mood and Affect: Mood normal.        Behavior: Behavior normal.     Palliative Assessment/Data: 30%   Existing Vynca/ACP Documentation: None  Assessment & Plan:   HPI/Patient Profile:  63 y.o. female  with past medical history of metastatic pancreatic adenocarcinoma to liver, pancytopenia, cirrhosis with ascites, GERD, presented with worsening of abdominal pain likely due to large ascites (LVP on 11/19, drained 4.2 L, improved abdominal pain) with hospital course c/b SVT requiring hemodynamic monitoring in ICU.  She is admitted on 04/11/2024 with shock likely mixed cardiogenic due to tachyarrhythmia with sepsis in the setting of peritonitis, A-fib with RVR, SBP versus secondary peritonitis status post paracentesis suggestive of SBP, acute respiratory failure with hypoxia, AKI, severe thrombocytopenia, normocytic anemia, and others.    Palliative medicine was consulted for GOC conversations.  SUMMARY OF RECOMMENDATIONS   Full code Full scope of care Family committed to doing everything and fully understand the poor prognosis and likely outcomes Continued emotional support of patient and family Palliative medicine will continue to follow  Symptom Management:  Per primary team Palliative medicine is available to assist as needed  Code Status:  Full Code  Prognosis: Unable to determine  Discharge Planning: To Be Determined  Discussed with: Patient, family, medical team, nursing team  Thank you for allowing us  to participate in the care of Nicole Johnson PMT will continue to support holistically.  Time Total: 37 min  Detailed review of medical records (labs, imaging, vital signs), medically appropriate exam, discussed with treatment team, counseling and education to patient, family, & staff, documenting clinical information, medication management, coordination of care  [name], NP Palliative Medicine Team  Team Phone # [phone] (Nights/Weekends)  01/20/2021, 8:17 AM

## 2024-04-17 NOTE — Progress Notes (Signed)
 [facility] ADULT ICU REPLACEMENT PROTOCOL   The patient does apply for the [facility] Adult ICU Electrolyte Replacment Protocol based on the criteria listed below:   1.Exclusion criteria: TCTS, ECMO, Dialysis, and Myasthenia Gravis patients 2. Is GFR >/= 30 ml/min? Yes.    Patient's GFR today is >60 3. Is SCr </= 2? Yes.   Patient's SCr is 0.87 mg/dL 4. Did SCr increase >/= 0.5 in 24 hours? No. 5.Pt's weight >40kg  Yes.   6. Abnormal electrolyte(s):   Phs 2.3, Mg 1.9  7. Electrolytes replaced per protocol 8.  Call MD STAT for K+ </= 2.5, Phos </= 1, or Mag </= 1 Physician:  CLEMENTEEN Epimenio Blackbird R Mahira Petras 04/17/2024 6:06 AM

## 2024-04-17 NOTE — Progress Notes (Signed)
 `  NAME:  Nicole Johnson, MRN:  [id number], DOB:  May 30, 1960, LOS: 5 ADMISSION DATE:  04/11/2024, CONSULTATION DATE:  04/17/24 REFERRING MD:  Dr. Earley, CHIEF COMPLAINT:  A. Fib with RVR.   History of Present Illness:  63 y.o. female with medical history significant of metastatic pancreatic adenocarcinoma to liver, pancytopenia, cirrhosis with ascites, GERD, presented with worsening of abdominal pain likely due to large ascites (LVP on 11/19, drained 4.2 L, improved abdominal pain)  with hospital course c/b SVT requiring hemodynamic monitoring in ICU.  HPI: Patient had her routine chemotherapy last Wednesday and she has been feeling fine afterwards.  Friday, patient and her husband ate a roasted chicken for lunch and both became sick in the evening, their symptoms including abdominal pain nauseous vomiting, they attributed to possible food toxin from the chicken.  Husband however recovered the next day and the patient on the other hand continue to feel sick, she had 1 day of diarrhea and continued to have nauseous and vomiting and decreased appetite for last 3 to 4 days, she could only tolerate some Ensure, and became very dehydrated.  She denied any hematemesis or hematochezia.  Her abdominal pain mainly located on lower abdomen bilateral, constant since yesterday.  She denied any fever or chills.  She told me that she is scheduled to have platelet transfusion at [facility] today.   ED Course: Afebrile, borderline tachycardia blood pressure 92/60, O2 saturation 100% on room air.  CT abdomen pelvis showed no acute findings, moderate ascites.  Blood work showed INR 1.6, sodium 128 BUN 39 creatinine 1.0 glucose 132 albumin  2.3, WBC 9.8 hemoglobin 8.0 compared to baseline 8.2-9.6, platelet 25.  Pertinent  Medical History   Past Medical History:  Diagnosis Date   Arthritis    Cancer (HCC)    pancreatic   GERD (gastroesophageal reflux disease)    PONV (postoperative nausea and vomiting)     Prediabetes    Significant Hospital Events: Including procedures, antibiotic start and stop dates in addition to other pertinent events   11/19 --> admitted, large volume paracentesis 4.2 L drained 11/22 --> SVT, given adenosine  6 mg and 12 mg, did not abort, came down to ICU for DCCV. Given amio bolus and infusion, came out of a. Fib into NSR. 11/23 --> overnight developed another episode of a. Fib with RVR, given another bolus of amio, back to NSR. Hgb down to 6.8, 1 unit of PRBCs ordered. 11/24 rebolus Amio for RVR , albumin  given  Interim History / Subjective:  Remains critically ill ,  On 70 mics of neo- Febrile 102 More hypoxic, now on nonrebreather Complains of reflux  Lines/Tubes: R chest port, PIVs, external urinary device, biliary stent  Objective    Blood pressure (!) 98/49, pulse 97, temperature (!) 102.6 F (39.2 C), temperature source Axillary, resp. rate (!) 23, height 5' 5 (1.651 m), weight 62.7 kg, last menstrual period 01/11/2011, SpO2 90%.        Intake/Output Summary (Last 24 hours) at 04/17/2024 0812 Last data filed at 04/17/2024 9357 Gross per 24 hour  Intake 1681.74 ml  Output 775 ml  Net 906.74 ml   Filed Weights   04/11/24 1744  Weight: 62.7 kg    Examination: General: cachectic, chronically ill appearing Eyes: PERRL, no scleral icterus ENMT: oropharynx clear, good dentition, no oral lesions, mallampati score I Skin: warm, intact, no rashes Neck: Elevated JVD, no lymphadenopathy CV: RRR, no MRG, nl S1 and S2, 2+ pitting peripheral edema  Resp: Decreased breath sounds bilateral, mild accessory muscle use Abdom: Distended + Fluid thrill, soft, nontender Neuro: Alert, interactive, nonfocal  Labs show hyponatremia 130, mild hypophosphatemia 2.3 no leukocytosis, severe chronic thrombocytopenia 27K, stable anemia  Resolved problem list   Assessment and Plan   #Shock: likely mixed cardiogenic due to tachyarrhythmia and septic in the setting of  peritonitis. #Atrial Fibrillation with RVR: CHADS2VASC is 1.  Echo shows normal LVEF, mildly elevated RVSP - Continue amiodarone  infusion, bolus amio 150 as needed for RVR - Not a candidate for anticoagulation due to low chads2vasc and severe thrombocytopenia - Phenylephrine  drip, titrate to MAP 65  - Continue midodrine  10 mg TID   #SBP vs. Secondary peritonitis: paracentesis yielded 1610 leukocytes, PMN 84%  This is c/w with diagnosis of spontaneous bacterial peritonitis versus secondary peritonitis. Prior history of percutaneous cholecystostomy 06/2023 for perforated cholecystitis and Klebsiella bacteremia 02/2024 Malignant ascites status post large-volume paracentesis on 11/6 and 11/20 - Continue Ceftriaxone  -  peritoneal cultures negative so far -  repeat paracentesis today , albumin  given in divided doses over the last 24 hours  Acute respiratory failure with hypoxia -increasing oxygen requirements may be related to abdominal distention - Chest x-ray today -BiPAP if worsening distress  #Stress Ulcer PPx: continue PPI    #AKI: likely pre-renal due to shock state, resolved - Avoid Nephrotoxins - Strict I/Os - Hypophosphatemia will be repleted   #Severe Thrombocytopenia #Normocytic Anemia > Pancytopenia likely due to bone marrow suppression from malignancy/chemoRx. - Transfuse PLT for goal > 10 or 50K for procedures, 1 unit today for paracentesis - Transfuse Hgb for goal > 7 mg/dL  Disposition: ICU appropriate for hemodynamic monitoring and vasopressors. Await palliative care consult.  She is clearly declining and is terminally ill from advanced malignancy.  She has refractory malignant ascites and shock .she would not do well in spite of advanced life support measures  Per my discussion, family indicated that they would like to see another Christmas.  Labs   CBC: Recent Labs  Lab 04/14/24 1201 04/15/24 0459 04/15/24 1422 04/16/24 0445 04/17/24 0428  WBC 7.4 4.6 8.5  5.8 5.4  NEUTROABS  --   --  7.7  --  5.0  HGB 8.4* 6.8* 9.5* 9.4* 9.8*  HCT 25.1* 21.3* 29.2* 28.3* 29.9*  MCV 86.9 88.4 87.2 85.0 84.7  PLT 18* 22* 29* 25* 27*    Basic Metabolic Panel: Recent Labs  Lab 04/13/24 0020 04/14/24 1201 04/15/24 0459 04/16/24 0445 04/17/24 0428  NA 129* 132* 132* 132* 130*  K 4.3 4.2 3.7 3.9 4.0  CL 99 101 100 101 100  CO2 22 21* 23 22 21*  GLUCOSE 111* 139* 136* 104* 116*  BUN 35* 38* 38* 30* 27*  CREATININE 1.14* 1.10* 0.98 0.88 0.87  CALCIUM 7.7* 7.9* 7.8* 7.8* 8.1*  MG  --   --  2.1  --  1.9  PHOS  --   --  2.6  --  2.3*   GFR: Estimated Creatinine Clearance: 60.3 mL/min (by C-G formula based on SCr of 0.87 mg/dL). Recent Labs  Lab 04/14/24 1200 04/14/24 1201 04/14/24 1442 04/15/24 0459 04/15/24 1422 04/16/24 0445 04/17/24 0428  WBC  --    < >  --  4.6 8.5 5.8 5.4  LATICACIDVEN 2.6*  --  1.8  --   --   --   --    < > = values in this interval not displayed.    Liver Function Tests: Recent Labs  Lab  04/11/24 0924 04/12/24 0813 04/14/24 1201 04/15/24 0459  AST 24 22 45* 34  ALT 27 19 24 19   ALKPHOS 245* 147* 135* 94  BILITOT 1.5* 1.8* 3.2* 2.6*  PROT 5.1* 4.9* 4.8* 4.8*  ALBUMIN  2.3* 2.9* 2.3* 3.0*   Recent Labs  Lab 04/11/24 0924  LIPASE <10*   Recent Labs  Lab 04/12/24 0813  AMMONIA 31    ABG    Component Value Date/Time   TCO2 23 10/08/2023 1227     Coagulation Profile: Recent Labs  Lab 04/11/24 0924 04/12/24 0813 04/13/24 0020  INR 1.6* 1.9* 1.7*    Cardiac Enzymes: No results for input(s): CKTOTAL, CKMB, CKMBINDEX, TROPONINI in the last 168 hours.  HbA1C: Hgb A1c MFr Bld  Date/Time Value Ref Range Status  04/15/2024 04:59 AM 6.1 (H) 4.8 - 5.6 % Final    Comment:    (NOTE) Diagnosis of Diabetes The following HbA1c ranges recommended by the American Diabetes Association (ADA) may be used as an aid in the diagnosis of diabetes mellitus.  Hemoglobin             Suggested A1C  NGSP%              Diagnosis  <5.7                   Non Diabetic  5.7-6.4                Pre-Diabetic  >6.4                   Diabetic  <7.0                   Glycemic control for                       adults with diabetes.    03/14/2023 09:09 AM 6.2 4.6 - 6.5 % Final    Comment:    Glycemic Control Guidelines for People with Diabetes:Non Diabetic:  <6%Goal of Therapy: <7%Additional Action Suggested:  >8%     CBG: Recent Labs  Lab 04/16/24 1710 04/16/24 1942 04/16/24 2332 04/17/24 0330 04/17/24 0732  GLUCAP 133* 169* 86 110* 91     My independent critical care time was 34 minutes  Harden Staff MD. FCCP. [facility] Pager : [phone]  If no response to pager , please call [phone] until 7 pm After 7:00 pm call Elink  [phone]   04/17/2024

## 2024-04-17 NOTE — Progress Notes (Signed)
 Attempted to obtain sputum sample, twice, obtaining no secretions. RN aware. Sputum trap left inline to try again later today.

## 2024-04-18 DIAGNOSIS — C259 Malignant neoplasm of pancreas, unspecified: Secondary | ICD-10-CM

## 2024-04-18 DIAGNOSIS — C787 Secondary malignant neoplasm of liver and intrahepatic bile duct: Secondary | ICD-10-CM | POA: Diagnosis not present

## 2024-04-18 DIAGNOSIS — R7881 Bacteremia: Secondary | ICD-10-CM | POA: Diagnosis not present

## 2024-04-18 DIAGNOSIS — R180 Malignant ascites: Secondary | ICD-10-CM | POA: Diagnosis not present

## 2024-04-18 DIAGNOSIS — E440 Moderate protein-calorie malnutrition: Secondary | ICD-10-CM | POA: Insufficient documentation

## 2024-04-18 DIAGNOSIS — K529 Noninfective gastroenteritis and colitis, unspecified: Secondary | ICD-10-CM | POA: Diagnosis not present

## 2024-04-18 LAB — BLOOD CULTURE ID PANEL (REFLEXED) - BCID2
A.calcoaceticus-baumannii: NOT DETECTED
Bacteroides fragilis: NOT DETECTED
CTX-M ESBL: NOT DETECTED
Candida albicans: NOT DETECTED
Candida auris: NOT DETECTED
Candida glabrata: NOT DETECTED
Candida krusei: NOT DETECTED
Candida parapsilosis: NOT DETECTED
Candida tropicalis: NOT DETECTED
Carbapenem resist OXA 48 LIKE: NOT DETECTED
Carbapenem resistance IMP: NOT DETECTED
Carbapenem resistance KPC: NOT DETECTED
Carbapenem resistance NDM: NOT DETECTED
Carbapenem resistance VIM: NOT DETECTED
Cryptococcus neoformans/gattii: NOT DETECTED
Enterobacter cloacae complex: NOT DETECTED
Enterobacterales: DETECTED — AB
Enterococcus Faecium: NOT DETECTED
Enterococcus faecalis: DETECTED — AB
Escherichia coli: NOT DETECTED
Haemophilus influenzae: NOT DETECTED
Klebsiella aerogenes: DETECTED — AB
Klebsiella oxytoca: NOT DETECTED
Klebsiella pneumoniae: NOT DETECTED
Listeria monocytogenes: NOT DETECTED
Neisseria meningitidis: NOT DETECTED
Proteus species: NOT DETECTED
Pseudomonas aeruginosa: NOT DETECTED
Salmonella species: NOT DETECTED
Serratia marcescens: NOT DETECTED
Staphylococcus aureus (BCID): NOT DETECTED
Staphylococcus epidermidis: NOT DETECTED
Staphylococcus lugdunensis: NOT DETECTED
Staphylococcus species: NOT DETECTED
Stenotrophomonas maltophilia: NOT DETECTED
Streptococcus agalactiae: NOT DETECTED
Streptococcus pneumoniae: NOT DETECTED
Streptococcus pyogenes: NOT DETECTED
Streptococcus species: NOT DETECTED
Vancomycin resistance: NOT DETECTED

## 2024-04-18 LAB — CBC WITH DIFFERENTIAL/PLATELET
Abs Immature Granulocytes: 0.05 K/uL (ref 0.00–0.07)
Basophils Absolute: 0.1 K/uL (ref 0.0–0.1)
Basophils Relative: 1 %
Eosinophils Absolute: 0 K/uL (ref 0.0–0.5)
Eosinophils Relative: 0 %
HCT: 32.6 % — ABNORMAL LOW (ref 36.0–46.0)
Hemoglobin: 10.4 g/dL — ABNORMAL LOW (ref 12.0–15.0)
Immature Granulocytes: 1 %
Lymphocytes Relative: 3 %
Lymphs Abs: 0.2 K/uL — ABNORMAL LOW (ref 0.7–4.0)
MCH: 27.8 pg (ref 26.0–34.0)
MCHC: 31.9 g/dL (ref 30.0–36.0)
MCV: 87.2 fL (ref 80.0–100.0)
Monocytes Absolute: 0.2 K/uL (ref 0.1–1.0)
Monocytes Relative: 2 %
Neutro Abs: 6.5 K/uL (ref 1.7–7.7)
Neutrophils Relative %: 93 %
Platelets: 35 K/uL — ABNORMAL LOW (ref 150–400)
RBC: 3.74 MIL/uL — ABNORMAL LOW (ref 3.87–5.11)
RDW: 21.3 % — ABNORMAL HIGH (ref 11.5–15.5)
Smear Review: NORMAL
WBC: 7 K/uL (ref 4.0–10.5)
nRBC: 0.3 % — ABNORMAL HIGH (ref 0.0–0.2)

## 2024-04-18 LAB — BASIC METABOLIC PANEL WITH GFR
Anion gap: 13 (ref 5–15)
BUN: 36 mg/dL — ABNORMAL HIGH (ref 8–23)
CO2: 20 mmol/L — ABNORMAL LOW (ref 22–32)
Calcium: 7.9 mg/dL — ABNORMAL LOW (ref 8.9–10.3)
Chloride: 99 mmol/L (ref 98–111)
Creatinine, Ser: 1.37 mg/dL — ABNORMAL HIGH (ref 0.44–1.00)
GFR, Estimated: 43 mL/min — ABNORMAL LOW (ref >60)
Glucose, Bld: 145 mg/dL — ABNORMAL HIGH (ref 70–99)
Potassium: 4 mmol/L (ref 3.5–5.1)
Sodium: 132 mmol/L — ABNORMAL LOW (ref 135–145)

## 2024-04-18 LAB — PREPARE PLATELET PHERESIS: Unit division: 0

## 2024-04-18 LAB — BPAM PLATELET PHERESIS
Blood Product Expiration Date: 202511282359
ISSUE DATE / TIME: 202511250934
Unit Type and Rh: 5100

## 2024-04-18 LAB — GLUCOSE, CAPILLARY
Glucose-Capillary: 124 mg/dL — ABNORMAL HIGH (ref 70–99)
Glucose-Capillary: 129 mg/dL — ABNORMAL HIGH (ref 70–99)
Glucose-Capillary: 143 mg/dL — ABNORMAL HIGH (ref 70–99)
Glucose-Capillary: 164 mg/dL — ABNORMAL HIGH (ref 70–99)
Glucose-Capillary: 176 mg/dL — ABNORMAL HIGH (ref 70–99)
Glucose-Capillary: 185 mg/dL — ABNORMAL HIGH (ref 70–99)

## 2024-04-18 LAB — PHOSPHORUS: Phosphorus: 4.6 mg/dL (ref 2.5–4.6)

## 2024-04-18 LAB — MAGNESIUM: Magnesium: 2.4 mg/dL (ref 1.7–2.4)

## 2024-04-18 MED ORDER — VITAL HP 1.0 CAL PO LIQD
1000.0000 mL | ORAL | Status: DC
  Administered 2024-04-18 – 2024-04-19 (×2): 1000 mL

## 2024-04-18 MED ORDER — SODIUM CHLORIDE 0.9 % IV SOLN
2.0000 g | Freq: Two times a day (BID) | INTRAVENOUS | Status: DC
  Administered 2024-04-18 – 2024-04-26 (×17): 2 g via INTRAVENOUS
  Filled 2024-04-18 (×16): qty 12.5

## 2024-04-18 MED ORDER — HYDROCORTISONE SOD SUC (PF) 100 MG IJ SOLR
100.0000 mg | Freq: Two times a day (BID) | INTRAMUSCULAR | Status: DC
  Administered 2024-04-18 – 2024-04-21 (×7): 100 mg via INTRAVENOUS
  Filled 2024-04-18 (×7): qty 2

## 2024-04-18 MED ORDER — AMIODARONE HCL 200 MG PO TABS
200.0000 mg | ORAL_TABLET | Freq: Two times a day (BID) | ORAL | Status: DC
  Administered 2024-04-18 – 2024-04-21 (×7): 200 mg
  Filled 2024-04-18 (×8): qty 1

## 2024-04-18 MED ORDER — SODIUM CHLORIDE 0.9 % IV SOLN
2.0000 g | Freq: Four times a day (QID) | INTRAVENOUS | Status: DC
  Administered 2024-04-18 – 2024-04-26 (×33): 2 g via INTRAVENOUS
  Filled 2024-04-18 (×34): qty 2000

## 2024-04-18 NOTE — Progress Notes (Signed)
 Daily Progress Note   Date: 04/18/2024   Patient Name: Nicole Johnson  DOB: 04-07-1961  MRN: [id number]  Age / Sex: 63 y.o., female  Attending Physician: Jude Harden GAILS, MD Primary Care Physician: Tower, Laine DELENA, MD Admit Date: 04/11/2024 Length of Stay: 6 days  Reason for Follow-up: Establishing goals of care  Past Medical History:  Diagnosis Date   Arthritis    Cancer [facility])    pancreatic   GERD (gastroesophageal reflux disease)    PONV (postoperative nausea and vomiting)    Prediabetes     Subjective:   Subjective: Chart Reviewed. Updates received. Patient Assessed. Created space and opportunity for patient  and family to explore thoughts and feelings regarding current medical situation.  Today's Discussion: Today before meeting with the patient/family, I reviewed the chart notes including PCCM procedure note from yesterday, nurse note from yesterday, RT note from yesterday, chaplain note from yesterday, nurse note from today, PCCM note from today.  I also reviewed vital signs, nursing flowsheets, medication administrations record, labs, and imaging. Labs reviewed include CBC which shows persistent normal white blood cell count 7.0 in the setting of septic shock with positive blood cultures, stable/improved hemoglobin at 10.4 today from a low of 6.8 reassuring for no further bleeding; additionally platelet count is improved today at 35 compared to 27 yesterday all in the setting of metastatic pancreatic adenocarcinoma and pancytopenia with cirrhosis and ascites.  BMP shows hyponatremia stable but persistently low at 132, BUN/creatinine increased to 36/1.37 from 27/0.7 yesterday in the setting of cirrhosis and pancreatic adenocarcinoma with liver metastasis.  Today saw the patient bedside, no family is present.  Nurses in the room providing care.  After seeing the patient I discussed with the nurse and charge nurse clinical progress in the past 24 hours including intubation yesterday due  to persistent low sats despite 100% nonrebreather.  This is in line with the patient and family's goals for full code and full scope of care.  Since then pressors have been added and are increasing, currently on Neo-Synephrine, vasopressin  0.03, Levophed  at 23.  Plan per PCCM is to taper the Neo-Synephrine with addition of vasopressin .  Blood cultures are positive for Klebsiella and Enterococcus, ID has been consulted.  At this time there have been no indication of family has changed her mind about full code and full scope of care.  Will continue to support.  Review of Systems  Unable to perform ROS   Objective:   Primary Diagnoses: Present on Admission: **None**   Vital Signs:  BP (!) 151/71   Pulse 60   Temp 98.6 F (37 C)   Resp (!) 24   Ht 5' 5 (1.651 m)   Wt 62.7 kg   LMP 01/11/2011   SpO2 99%   BMI 23.00 kg/m   Physical Exam Vitals and nursing note reviewed.  Constitutional:      General: She is not in acute distress.    Appearance: She is ill-appearing.     Interventions: She is sedated and intubated.  HENT:     Head: Normocephalic and atraumatic.  Cardiovascular:     Rate and Rhythm: Normal rate.  Pulmonary:     Effort: Pulmonary effort is normal. No respiratory distress. She is intubated.     Breath sounds: No wheezing or rhonchi.     Comments: NRB in place Abdominal:     General: Abdomen is flat.     Palpations: Abdomen is soft.  Skin:    General:  Skin is warm and dry.  Neurological:     General: No focal deficit present.     Mental Status: She is alert.  Psychiatric:        Mood and Affect: Mood normal.        Behavior: Behavior normal.     Palliative Assessment/Data: 10%   Existing Vynca/ACP Documentation: None  Assessment & Plan:   HPI/Patient Profile:  63 y.o. female  with past medical history of metastatic pancreatic adenocarcinoma to liver, pancytopenia, cirrhosis with ascites, GERD, presented with worsening of abdominal pain likely due  to large ascites (LVP on 11/19, drained 4.2 L, improved abdominal pain) with hospital course c/b SVT requiring hemodynamic monitoring in ICU.  She is admitted on 04/11/2024 with shock likely mixed cardiogenic due to tachyarrhythmia with sepsis in the setting of peritonitis, A-fib with RVR, SBP versus secondary peritonitis status post paracentesis suggestive of SBP, acute respiratory failure with hypoxia, AKI, severe thrombocytopenia, normocytic anemia, and others.    Palliative medicine was consulted for GOC conversations.  SUMMARY OF RECOMMENDATIONS   Full code Full scope of care Family committed to doing everything and fully understand the poor prognosis and likely outcomes Continued emotional support of patient and family Palliative medicine will monitor for changes and follow-up as needed  Symptom Management:  Per primary team Palliative medicine is available to assist as needed  Code Status: Full Code  Prognosis: Unable to determine  Discharge Planning: To Be Determined  Discussed with: Medical team, nursing team  Thank you for allowing us  to participate in the care of [name] PMT will continue to support holistically.  Time Total: 25 min  Detailed review of medical records (labs, imaging, vital signs), medically appropriate exam, discussed with treatment team, counseling and education to patient, family, & staff, documenting clinical information, medication management, coordination of care  [name], NP Palliative Medicine Team  Team Phone # [phone] (Nights/Weekends)  01/20/2021, 8:17 AM

## 2024-04-18 NOTE — Consult Note (Addendum)
 [facility]    Date of Admission:  04/11/2024   Total days of inpatient antibiotics 7        Reason for Consult: Polymicrobial bacteremia    Principal Problem:   Gastroenteritis Active Problems:   Antineoplastic chemotherapy induced anemia   Anemia   Acute respiratory failure with hypoxia [facility])   Assessment:  63 year old female with metastatic pancreatic carcinoma to the liver on chemotherapy via right chest port, cirrhosis with ascites, GERD presented with large volume ascites and abdominal pain after eating chicken.  Hospital course complicated by fevers found to have #Polymicrobial bacteremia secondary to likely peritoneal source versus port( hospital associated) #Concern for SBP versus secondary peritonitis in setting of malignant ascites #Metastatic pancreas adenocarcinoma, to the liver on chemotherapy via port - Patient was admitted on 11/19 with abdominal pain thought to be peritonitis underwent large-volume paracentesis, 4.2 L with negative cultures.  CT abdomen pelvis showed no acute changes, mild ascites - Had mild fever on 11/20. [facility] course complicated by A-fib with RVR - Started on ceftriaxone  for SBP versus secondary peritonitis with ceftriaxone  on 11/22.  Then developed fevers Tmax 102.6 on 11/25.  Patient intubated and on pressors - Blood cultures grew 3 out of 4 bottles GPC, 1 out of 4 GNR with BC ID showing E. coli, Klebsiella, E faecalis.  ID already consulted  Recommendations: - Ampicillin  and cefepime  -Blood cultures - Will need TEE, TTE without vegetation - Port removal given E faecalis likely present at 3 out of 4 bottles.  Paracentesis cultures have been negative x 2, source is unclear.  Suspect possible GI translocation versus port - relayed plan to primary - Standard precautions - Dr. Overton is covering this weekend.  New ID service on Monday Microbiology:   Antibiotics: Ceftriaxone  11/22-11/25 Ampicillin  sepsis  pain 11/26 Cultures: Blood 11/25 3 out of 4 bottles GPC, 1 out of 4 GNR Urine  Other   HPI: Nicole Johnson is a 63 y.o. female with past medical history significant for metastatic pancreatic adenocarcinoma to liver on chemotherapy via port, pancytopenia, cirrhosis with ascites, GERD, recent Klebsiella aeruginosa bacteremia discharged on ciprofloxacin  to complete a 2-week course, presented with abdominal pain likely due to large ascites.  Patient had a routine chemotherapy on Wednesday 11/12 when she had been feeling fine afterwards.  Around Friday patient ate chicken for lunch became sick in the evening.  On arrival she was hypotensive blood pressure 92/60, WBC 9.8 K.  CT abdomen pelvis showed no acute findings except for mild to moderate ascites. She underwent large-volume paracentesis for 1 L drained cultures remain negative on 11/19.  Next day had a mild fever with Tmax 101.1.  Hospital course complicated with RVR.  Ceftriaxone  added on 11/22 for possible SBP secondary peritonitis in the setting of malignant ascites.  Developed Tmax of 102.6 on 11/25 blood cultures obtained ID engaged as blood cultures on [facility] ID showed E. coli, Klebsiella aeruginosa and E faecalis.  Review of Systems: Review of Systems  Unable to perform ROS: Intubated    Past Medical History:  Diagnosis Date   Arthritis    Cancer (HCC)    pancreatic   GERD (gastroesophageal reflux disease)    PONV (postoperative nausea and vomiting)    Prediabetes     Social History   Tobacco Use   Smoking status: Never   Smokeless tobacco: Never  Vaping Use   Vaping status: Never Used  Substance Use  Topics   Alcohol  use: Not Currently    Comment: occ   Drug use: Never    Family History  Problem Relation Age of Onset   Diabetes Mother    Stroke Mother    Lung cancer Father 73       smoked   Diabetes Father    Diabetes Sister    Breast cancer Sister 23   Diabetes Brother    Cancer Brother 60       neck cancer    Heart disease Maternal Grandmother    Breast cancer Maternal Grandmother        dx. <50, double mastectomy   Heart disease Maternal Grandfather    Scheduled Meds:  amiodarone   200 mg Per Tube BID   Chlorhexidine  Gluconate Cloth  6 each Topical Q0600   docusate  100 mg Per Tube BID   feeding supplement (VITAL HIGH PROTEIN)  1,000 mL Per Tube Q24H   hydrocortisone  sod succinate (SOLU-CORTEF ) inj  100 mg Intravenous Q12H   insulin  aspart  0-9 Units Subcutaneous Q4H   midodrine   10 mg Per Tube TID WC   mouth rinse  15 mL Mouth Rinse Q2H   pantoprazole  (PROTONIX ) IV  40 mg Intravenous Q12H   polyethylene glycol  17 g Per Tube Daily   sodium chloride  flush  10-40 mL Intracatheter Q12H   thiamine  (VITAMIN B1) injection  100 mg Intravenous Daily   Continuous Infusions:  ampicillin  (OMNIPEN) IV 2 g (04/18/24 0952)   ceFEPime  (MAXIPIME ) IV Stopped (04/18/24 0855)   fentaNYL  infusion INTRAVENOUS 275 mcg/hr (04/18/24 0902)   norepinephrine  (LEVOPHED ) Adult infusion 23 mcg/min (04/18/24 0958)   phenylephrine  (NEO-SYNEPHRINE) Adult infusion 110 mcg/min (04/18/24 0902)   vasopressin  0.03 Units/min (04/18/24 0902)   PRN Meds:.acetaminophen  **OR** acetaminophen , fentaNYL , fentaNYL  (SUBLIMAZE ) injection, midazolam  PF, morphine  injection, mouth rinse, mouth rinse, oxyCODONE , promethazine , sodium chloride  flush Allergies  Allergen Reactions   Other Nausea Only and Other (See Comments)    Certain types of Anesthesia cause SEVERE NAUSEA   Codeine Nausea Only   Hydrocodone -Acetaminophen  Nausea Only and Other (See Comments)    Reaction unconfirmed, ed/might be nausea   Meloxicam Nausea Only   Olanzapine Other (See Comments)    Made me very sleepy, but I could not sleep.   Oxycodone  Other (See Comments)    Made me loopy.   Tramadol Nausea Only    OBJECTIVE: Blood pressure 132/61, pulse 66, temperature 99.5 F (37.5 C), resp. rate (!) 24, height 5' 5 (1.651 m), weight 62.7 kg, last menstrual  period 01/11/2011, SpO2 98%.  Physical Exam Constitutional:      Comments: intubated  HENT:     Head: Normocephalic and atraumatic.     Right Ear: Tympanic membrane normal.     Left Ear: Tympanic membrane normal.     Nose: Nose normal.     Mouth/Throat:     Mouth: Mucous membranes are moist.  Eyes:     Extraocular Movements: Extraocular movements intact.     Conjunctiva/sclera: Conjunctivae normal.     Pupils: Pupils are equal, round, and reactive to light.  Cardiovascular:     Rate and Rhythm: Normal rate and regular rhythm.     Heart sounds: No murmur heard.    No friction rub. No gallop.  Pulmonary:     Effort: Pulmonary effort is normal.     Breath sounds: Normal breath sounds.  Abdominal:     General: There is distension.     Palpations: Abdomen is soft.  Musculoskeletal:        General: Normal range of motion.  Skin:    Coloration: Skin is jaundiced.  Psychiatric:        Mood and Affect: Mood normal.     Lab Results Lab Results  Component Value Date   WBC 7.0 04/18/2024   HGB 10.4 (L) 04/18/2024   HCT 32.6 (L) 04/18/2024   MCV 87.2 04/18/2024   PLT 35 (L) 04/18/2024    Lab Results  Component Value Date   CREATININE 1.37 (H) 04/18/2024   BUN 36 (H) 04/18/2024   NA 132 (L) 04/18/2024   K 4.0 04/18/2024   CL 99 04/18/2024   CO2 20 (L) 04/18/2024    Lab Results  Component Value Date   ALT 19 04/15/2024   AST 34 04/15/2024   ALKPHOS 94 04/15/2024   BILITOT 2.6 (H) 04/15/2024       Loney Stank, MD [facility] 04/18/2024, 11:10 AM

## 2024-04-18 NOTE — Progress Notes (Signed)
 Chaplain followed up with pt Nicole Johnson and her husband Nicole Johnson. Nicole Johnson welcomed me into the room and shared Nicole Johnson thoughts and emotions around Nicole Johnson's health. He expressed that he feels well supported by their large family, and appreciates their presence and calls, even if at times it can feel a bit overwhelming. Nicole Johnson sister-in-law is planning to visit tomorrow and bring a thanksgiving plate.  He shared a traumatic hospital experience (as a result of staff negligence) that led to Nicole Johnson own mother's death some years ago, which may impact how he navigates this current situation. That said, he feels the care he has received here has been good overall, especially from [employer].  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  I celebrated Nicole Johnson's decision to stay at home last night in a spirit of rest and self-care.  He expressed gratitude for chaplain presence, and we will continue to follow.

## 2024-04-18 NOTE — Progress Notes (Signed)
 eLink Physician-Brief Progress Note Patient Name: Nicole Johnson DOB: Apr 21, 1961 MRN: [id number]   Date of Service  04/18/2024  HPI/Events of Note  pt CBG was 176. They are on a sliding scale. There is an order to notify if the glucose is >160  Discussed with RN.  Camera: On levo at 24, vaso. On Vent, stable vitals.   eICU Interventions  Changed parameter CBG > 180. On ssi. Goal to keep < 180.      Intervention Category Intermediate Interventions: Hyperglycemia - evaluation and treatment  Nicole Johnson 04/18/2024, 8:54 PM  00:25 parameters changed earlier to notify MD of CBG>180. Most recent CBG at 2338 = 185. Changed parameter to call us  if over 250, on ssi already. Goal to keep < 180.

## 2024-04-18 NOTE — Progress Notes (Signed)
 Initial Nutrition Assessment  DOCUMENTATION CODES:   Non-severe (moderate) malnutrition in context of chronic illness  INTERVENTION:  - Starting trickle tube feeds today per CCM: Vital High Protein @ 10mL/hr  - Monitor magnesium , potassium, and phosphorus daily for at least 3 days, MD to replete as needed, as pt is at risk for refeeding syndrome.  - Once able to advance past trickles, recommend: Vital 1.2 at 60 ml/h (1080 ml per day) *Would recommend starting at 80mL/hr and advancing by 10mL Q12H Provides 1728 kcal, 108 gm protein, 1168 ml free water  daily  - FWF per CCM.   - Monitor weight trends.  NUTRITION DIAGNOSIS:   Moderate Malnutrition related to chronic illness (metastatic pancreatic adenocarcinoma to liver; cirrhosis with ascites) as evidenced by moderate fat depletion, moderate muscle depletion.  GOAL:   Patient will meet greater than or equal to 90% of their needs  MONITOR:   Vent status, Labs, Weight trends, TF tolerance  REASON FOR ASSESSMENT:   Consult Enteral/tube feeding initiation and management (Trickle TF)  ASSESSMENT:   63 y.o. female with PMH significant of metastatic pancreatic adenocarcinoma to liver, pancytopenia, cirrhosis with ascites, GERD who presented with worsening abdominal pain likely due to large ascites.   11/19 paracentesis with 4.2L removed 11/20 Admit 11/25 paracentesis with 2.5L removed; Intubated  Patient is currently intubated on ventilator support MV: 10.3 L/min Temp (24hrs), Avg:98.3 F (36.8 C), Min:94.1 F (34.5 C), Max:100.2 F (37.9 C)  Patient's husband at bedside at time of visit. Reports a recent UBW of 130# and that she lost a lot of weight with cancer but has been for the past stable for several months. Per EMR, patient weighed at 171# in November 2024 and dropped to 133# by April. However, her weight has been stable since that time.   Husband reports the patient typically eats 2 meals a day, breakfast and  dinner. She has also been drinking Boost Plus twice daily at home the past few months after being told he needed to get more protein.  She was eating normally up until about 1 week ago when she began having abdominal pain.   Patient initially on a diet at the beginning of this admission. Only meal documented is 100% of breakfast on 11/24.  Per CCM, can initiate trickle tube feeds today. Patient initially on 3 pressors this AM but weaning down to 2. Goal tube feed recs above for once able to advance.   Medications reviewed and include: Colace, Miralax , Protonix , 100mg  thiamine  Fentanyl  Levophed  @ 23 mcg/min Neo @ 20 mcg/min Vasopressin  @ 0.03 units/min  Labs reviewed:  Na 132 Creatinine 1.37 HA1C 6.1   NUTRITION - FOCUSED PHYSICAL EXAM:  Flowsheet Row Most Recent Value  Orbital Region Moderate depletion  Upper Arm Region Moderate depletion  Thoracic and Lumbar Region Moderate depletion  Buccal Region Unable to assess  Temple Region Severe depletion  Clavicle Bone Region Mild depletion  Clavicle and Acromion Bone Region Moderate depletion  Scapular Bone Region Unable to assess  Dorsal Hand Unable to assess  Patellar Region No depletion  Anterior Thigh Region No depletion  Posterior Calf Region No depletion  Edema (RD Assessment) Mild  Hair Reviewed  Eyes Reviewed  Mouth Unable to assess  Skin Reviewed  Nails Reviewed    Diet Order:   Diet Order             Diet NPO time specified  Diet effective now  EDUCATION NEEDS:  Education needs have been addressed  Skin:  Skin Assessment: Skin Integrity Issues: Skin Integrity Issues:: DTI DTI: Mid Buttocks  Last BM:  11/25 - type 7  Height:  Ht Readings from Last 1 Encounters:  04/17/24 5' 5 (1.651 m)   Weight:  Wt Readings from Last 1 Encounters:  04/11/24 62.7 kg   Ideal Body Weight:  56.82 kg  BMI:  Body mass index is 23 kg/m.  Estimated Nutritional Needs:  Kcal:  1700-1900  kcals Protein:  95-115 grams Fluid:  >/= 1.7L    Trude Ned RD, LDN Contact via Secure Chat.

## 2024-04-18 NOTE — Progress Notes (Signed)
 PHARMACY - PHYSICIAN COMMUNICATION CRITICAL VALUE ALERT - BLOOD CULTURE IDENTIFICATION (BCID)  Nicole Johnson is an 63 y.o. female who presented to [facility] on 04/11/2024 with a chief complaint of abdominal pain. Patient has been in the ICU, now intubated and sedated requiring vasopressors.  Assessment:  Bcx: 2/4 E.faecalis, 1/4 K.aerogenes On ceftriaxone  for possible peritonitis  Name of physician (or Provider) Contacted: Dr. Jude  Current antibiotics: ceftriaxone   Changes to prescribed antibiotics recommended: cefepime  + ampicillin , auto consult to ID for enterococcus  Results for orders placed or performed during the hospital encounter of 04/11/24  Blood Culture ID Panel (Reflexed) (Collected: 04/17/2024  9:06 AM)  Result Value Ref Range   Enterococcus faecalis DETECTED (A) NOT DETECTED   Enterococcus Faecium NOT DETECTED NOT DETECTED   Listeria monocytogenes NOT DETECTED NOT DETECTED   Staphylococcus species NOT DETECTED NOT DETECTED   Staphylococcus aureus (BCID) NOT DETECTED NOT DETECTED   Staphylococcus epidermidis NOT DETECTED NOT DETECTED   Staphylococcus lugdunensis NOT DETECTED NOT DETECTED   Streptococcus species NOT DETECTED NOT DETECTED   Streptococcus agalactiae NOT DETECTED NOT DETECTED   Streptococcus pneumoniae NOT DETECTED NOT DETECTED   Streptococcus pyogenes NOT DETECTED NOT DETECTED   A.calcoaceticus-baumannii NOT DETECTED NOT DETECTED   Bacteroides fragilis NOT DETECTED NOT DETECTED   Enterobacterales DETECTED (A) NOT DETECTED   Enterobacter cloacae complex NOT DETECTED NOT DETECTED   Escherichia coli NOT DETECTED NOT DETECTED   Klebsiella aerogenes DETECTED (A) NOT DETECTED   Klebsiella oxytoca NOT DETECTED NOT DETECTED   Klebsiella pneumoniae NOT DETECTED NOT DETECTED   Proteus species NOT DETECTED NOT DETECTED   Salmonella species NOT DETECTED NOT DETECTED   Serratia marcescens NOT DETECTED NOT DETECTED   Haemophilus influenzae NOT DETECTED NOT  DETECTED   Neisseria meningitidis NOT DETECTED NOT DETECTED   Pseudomonas aeruginosa NOT DETECTED NOT DETECTED   Stenotrophomonas maltophilia NOT DETECTED NOT DETECTED   Candida albicans NOT DETECTED NOT DETECTED   Candida auris NOT DETECTED NOT DETECTED   Candida glabrata NOT DETECTED NOT DETECTED   Candida krusei NOT DETECTED NOT DETECTED   Candida parapsilosis NOT DETECTED NOT DETECTED   Candida tropicalis NOT DETECTED NOT DETECTED   Cryptococcus neoformans/gattii NOT DETECTED NOT DETECTED   CTX-M ESBL NOT DETECTED NOT DETECTED   Carbapenem resistance IMP NOT DETECTED NOT DETECTED   Carbapenem resistance KPC NOT DETECTED NOT DETECTED   Carbapenem resistance NDM NOT DETECTED NOT DETECTED   Carbapenem resist OXA 48 LIKE NOT DETECTED NOT DETECTED   Vancomycin resistance NOT DETECTED NOT DETECTED   Carbapenem resistance VIM NOT DETECTED NOT DETECTED    Stefano MARLA Bologna, PharmD, BCPS Clinical Pharmacist 04/18/2024 8:08 AM

## 2024-04-18 NOTE — Progress Notes (Signed)
 `  NAME:  Nicole Johnson, MRN:  [id number], DOB:  06/06/1960, LOS: 6 ADMISSION DATE:  04/11/2024, CONSULTATION DATE:  04/18/24 REFERRING MD:  Dr. Earley, CHIEF COMPLAINT:  A. Fib with RVR.   History of Present Illness:  63 y.o. female with medical history significant of metastatic pancreatic adenocarcinoma to liver, pancytopenia, cirrhosis with ascites, GERD, presented with worsening of abdominal pain likely due to large ascites (LVP on 11/19, drained 4.2 L, improved abdominal pain)  with hospital course c/b SVT requiring hemodynamic monitoring in ICU.  HPI: Patient had her routine chemotherapy last Wednesday and she has been feeling fine afterwards.  Friday, patient and her husband ate a roasted chicken for lunch and both became sick in the evening, their symptoms including abdominal pain nauseous vomiting, they attributed to possible food toxin from the chicken.  Husband however recovered the next day and the patient on the other hand continue to feel sick, she had 1 day of diarrhea and continued to have nauseous and vomiting and decreased appetite for last 3 to 4 days, she could only tolerate some Ensure, and became very dehydrated.  She denied any hematemesis or hematochezia.  Her abdominal pain mainly located on lower abdomen bilateral, constant since yesterday.  She denied any fever or chills.  She told me that she is scheduled to have platelet transfusion at [facility] cancer center today.   ED Course: Afebrile, borderline tachycardia blood pressure 92/60, O2 saturation 100% on room air.  CT abdomen pelvis showed no acute findings, moderate ascites.  Blood work showed INR 1.6, sodium 128 BUN 39 creatinine 1.0 glucose 132 albumin  2.3, WBC 9.8 hemoglobin 8.0 compared to baseline 8.2-9.6, platelet 25.  Pertinent  Medical History   Past Medical History:  Diagnosis Date   Arthritis    Cancer (HCC)    pancreatic   GERD (gastroesophageal reflux disease)    PONV (postoperative nausea and vomiting)     Prediabetes    Significant Hospital Events: Including procedures, antibiotic start and stop dates in addition to other pertinent events   11/19 --> admitted, large volume paracentesis 4.2 L drained 11/22 --> SVT, given adenosine  6 mg and 12 mg, did not abort, came down to ICU for DCCV. Given amio bolus and infusion, came out of a. Fib into NSR. 11/23 --> overnight developed another episode of a. Fib with RVR, given another bolus of amio, back to NSR. Hgb down to 6.8, 1 unit of PRBCs ordered. 11/24 rebolus Amio for RVR , albumin  given 11/25 2.5 L paracentesis after Cherina transfusion, worsening shock 11/25 ETT >>  Interim History / Subjective:  critically ill intubated, worsening shock Neo-Synephrine.  Levophed  at 20 mics Sedated on fentanyl   Lines/Tubes: R chest port, PIVs, external urinary device, biliary stent  Objective    Blood pressure (!) 91/46, pulse 69, temperature 99.5 F (37.5 C), temperature source Oral, resp. rate (!) 24, height 5' 5 (1.651 m), weight 62.7 kg, last menstrual period 01/11/2011, SpO2 98%.    Vent Mode: PRVC FiO2 (%):  [50 %-100 %] 50 % Set Rate:  [16 bmp-24 bmp] 24 bmp Vt Set:  [450 mL] 450 mL PEEP:  [5 cmH20-10 cmH20] 5 cmH20 Plateau Pressure:  [18 cmH20-26 cmH20] 18 cmH20   Intake/Output Summary (Last 24 hours) at 04/18/2024 0828 Last data filed at 04/18/2024 9357 Gross per 24 hour  Intake 3744.22 ml  Output 325 ml  Net 3419.22 ml   Filed Weights   04/11/24 1744  Weight: 62.7 kg  Examination: General: cachectic, chronically ill appearing Eyes: PERRL, no scleral icterus ENMT: oropharynx clear, good dentition, no oral lesions, mallampati score I Skin: warm, intact, no rashes Neck: Elevated JVD, no lymphadenopathy CV: RRR, no MRG, nl S1 and S2, 2+ pitting peripheral edema Resp: Bilateral ventilated breath sounds, no accessory muscle use Abdom: Distended, fluid thrill present, nontender Neuro: Alert, interactive, nonfocal  Labs show  hyponatremia 132, worsening renal function 36/1.4, no leukocytosis, stable anemia  Chest x-ray 11/25 shows ET tube in OG tube in position, bilateral airspace disease?  Effusion  Resolved problem list   Assessment and Plan   # Septic shock: Due to presumed peritonitis.  BC ID on 9/25 showing Klebsiella and Enterococcus #SBP vs. Secondary peritonitis: paracentesis yielded 1610 leukocytes, PMN 84%   Prior history of percutaneous cholecystostomy 06/2023 for perforated cholecystitis and Klebsiella bacteremia 02/2024 .  With polymicrobial bacteremia, there would be concern for GI fistula, CT abdomen was negative on 11/19 Malignant ascites status post large-volume paracentesis on 11/6, 11/20 & 11/25 - Broaden antibiotics to cefepime  and ampicillin , no vancomycin resistance detected ,peritoneal cultures negative so far - Add vasopressin  and taper Neo-Synephrine to off, continue Levophed  - May add stress dose steroids    #Atrial Fibrillation with RVR: CHADS2VASC is 1.  Echo shows normal LVEF, mildly elevated RVSP - Change amiodarone  to 200 twice daily - Not a candidate for anticoagulation due to low chads2vasc and severe thrombocytopenia - Continue midodrine  10 mg TID  Acute respiratory failure with hypoxia -increasing oxygen requirements not relieved by paracentesis - Low tidal volume ventilation - SAT/SBT daily  #Stress Ulcer PPx: continue PPI   #AKI: Due to septic shock - Avoid Nephrotoxins - Strict I/Os - Received albumin  prior to paracentesis for volume expansion   #Severe Thrombocytopenia status post platelet transfusion 11/25 #Normocytic Anemia > Pancytopenia likely due to bone marrow suppression from malignancy/chemoRx. - Transfuse PLT for goal > 10 or 50K for procedures - Transfuse Hgb for goal > 7 mg/dL  Disposition: ICU appropriate for vasopressors and mechanical ventilation Palliative care following. she is clearly declining and is terminally ill from advanced  malignancy.  She has refractory malignant ascites and severe septic shock .family has opted for advanced life support measures.  Husband expressed some frustration over repeated discussion of CODE STATUS, poor prognosis has been conveyed to them   Labs   CBC: Recent Labs  Lab 04/15/24 0459 04/15/24 1422 04/16/24 0445 04/17/24 0428 04/18/24 0342  WBC 4.6 8.5 5.8 5.4 7.0  NEUTROABS  --  7.7  --  5.0 6.5  HGB 6.8* 9.5* 9.4* 9.8* 10.4*  HCT 21.3* 29.2* 28.3* 29.9* 32.6*  MCV 88.4 87.2 85.0 84.7 87.2  PLT 22* 29* 25* 27* 35*    Basic Metabolic Panel: Recent Labs  Lab 04/14/24 1201 04/15/24 0459 04/16/24 0445 04/17/24 0428 04/18/24 0342  NA 132* 132* 132* 130* 132*  K 4.2 3.7 3.9 4.0 4.0  CL 101 100 101 100 99  CO2 21* 23 22 21* 20*  GLUCOSE 139* 136* 104* 116* 145*  BUN 38* 38* 30* 27* 36*  CREATININE 1.10* 0.98 0.88 0.87 1.37*  CALCIUM 7.9* 7.8* 7.8* 8.1* 7.9*  MG  --  2.1  --  1.9 2.4  PHOS  --  2.6  --  2.3* 4.6   GFR: Estimated Creatinine Clearance: 38.3 mL/min (A) (by C-G formula based on SCr of 1.37 mg/dL (H)). Recent Labs  Lab 04/14/24 1200 04/14/24 1201 04/14/24 1442 04/15/24 0459 04/15/24 1422 04/16/24 0445 04/17/24  9571 04/18/24 0342  WBC  --    < >  --    < > 8.5 5.8 5.4 7.0  LATICACIDVEN 2.6*  --  1.8  --   --   --   --   --    < > = values in this interval not displayed.    Liver Function Tests: Recent Labs  Lab 04/11/24 0924 04/12/24 0813 04/14/24 1201 04/15/24 0459  AST 24 22 45* 34  ALT 27 19 24 19   ALKPHOS 245* 147* 135* 94  BILITOT 1.5* 1.8* 3.2* 2.6*  PROT 5.1* 4.9* 4.8* 4.8*  ALBUMIN  2.3* 2.9* 2.3* 3.0*   Recent Labs  Lab 04/11/24 0924  LIPASE <10*   Recent Labs  Lab 04/12/24 0813  AMMONIA 31    ABG    Component Value Date/Time   PHART 7.25 (L) 04/17/2024 1710   PCO2ART 45 04/17/2024 1710   PO2ART 115 (H) 04/17/2024 1710   HCO3 20.1 04/17/2024 1710   TCO2 23 10/08/2023 1227   ACIDBASEDEF 7.5 (H) 04/17/2024 1710    O2SAT 99.6 04/17/2024 1710     Coagulation Profile: Recent Labs  Lab 04/11/24 0924 04/12/24 0813 04/13/24 0020  INR 1.6* 1.9* 1.7*    Cardiac Enzymes: No results for input(s): CKTOTAL, CKMB, CKMBINDEX, TROPONINI in the last 168 hours.  HbA1C: Hgb A1c MFr Bld  Date/Time Value Ref Range Status  04/15/2024 04:59 AM 6.1 (H) 4.8 - 5.6 % Final    Comment:    (NOTE) Diagnosis of Diabetes The following HbA1c ranges recommended by the American Diabetes Association (ADA) may be used as an aid in the diagnosis of diabetes mellitus.  Hemoglobin             Suggested A1C NGSP%              Diagnosis  <5.7                   Non Diabetic  5.7-6.4                Pre-Diabetic  >6.4                   Diabetic  <7.0                   Glycemic control for                       adults with diabetes.    03/14/2023 09:09 AM 6.2 4.6 - 6.5 % Final    Comment:    Glycemic Control Guidelines for People with Diabetes:Non Diabetic:  <6%Goal of Therapy: <7%Additional Action Suggested:  >8%     CBG: Recent Labs  Lab 04/17/24 1524 04/17/24 1931 04/17/24 2314 04/18/24 0329 04/18/24 0816  GLUCAP 147* 194* 188* 129* 124*     My independent critical care time was 42 minutes  Harden Staff MD. FCCP. [facility] Pager : [phone]  If no response to pager , please call [phone] until 7 pm After 7:00 pm call Elink  [phone]   04/18/2024

## 2024-04-18 NOTE — Progress Notes (Signed)
 eLink Physician-Brief Progress Note Patient Name: Nicole Johnson DOB: 07-04-1960 MRN: [id number]   Date of Service  04/18/2024  HPI/Events of Note  Notified of 1/4 blood culture bottles growing Klebsiella aerogenes.  No resistance genes detected.  No leukocytosis on labs.    eICU Interventions  Will maintain on ceftriaxone  for now.      Vinh Sachs M DELA CRUZ 04/18/2024, 5:54 AM

## 2024-04-18 NOTE — Progress Notes (Signed)
 PHARMACY - PHYSICIAN COMMUNICATION CRITICAL VALUE ALERT - BLOOD CULTURE IDENTIFICATION (BCID)  Nicole Johnson is an 63 y.o. female who presented to [facility] on 04/11/2024 with a chief complaint of worsening N/V and abd pain.  Assessment:  1/4 kleb aerogenes  Name of physician (or Provider) Contacted: E-Link  Current antibiotics: CTX  Changes to prescribed antibiotics recommended:  Suggested change to cefepime   Results for orders placed or performed during the hospital encounter of 03/15/24  Blood Culture ID Panel (Reflexed) (Collected: 03/15/2024  1:38 PM)  Result Value Ref Range   Enterococcus faecalis NOT DETECTED NOT DETECTED   Enterococcus Faecium NOT DETECTED NOT DETECTED   Listeria monocytogenes NOT DETECTED NOT DETECTED   Staphylococcus species NOT DETECTED NOT DETECTED   Staphylococcus aureus (BCID) NOT DETECTED NOT DETECTED   Staphylococcus epidermidis NOT DETECTED NOT DETECTED   Staphylococcus lugdunensis NOT DETECTED NOT DETECTED   Streptococcus species NOT DETECTED NOT DETECTED   Streptococcus agalactiae NOT DETECTED NOT DETECTED   Streptococcus pneumoniae NOT DETECTED NOT DETECTED   Streptococcus pyogenes NOT DETECTED NOT DETECTED   A.calcoaceticus-baumannii NOT DETECTED NOT DETECTED   Bacteroides fragilis NOT DETECTED NOT DETECTED   Enterobacterales DETECTED (A) NOT DETECTED   Enterobacter cloacae complex NOT DETECTED NOT DETECTED   Escherichia coli NOT DETECTED NOT DETECTED   Klebsiella aerogenes DETECTED (A) NOT DETECTED   Klebsiella oxytoca NOT DETECTED NOT DETECTED   Klebsiella pneumoniae NOT DETECTED NOT DETECTED   Proteus species NOT DETECTED NOT DETECTED   Salmonella species NOT DETECTED NOT DETECTED   Serratia marcescens NOT DETECTED NOT DETECTED   Haemophilus influenzae NOT DETECTED NOT DETECTED   Neisseria meningitidis NOT DETECTED NOT DETECTED   Pseudomonas aeruginosa NOT DETECTED NOT DETECTED   Stenotrophomonas maltophilia NOT DETECTED NOT DETECTED    Candida albicans NOT DETECTED NOT DETECTED   Candida auris NOT DETECTED NOT DETECTED   Candida glabrata NOT DETECTED NOT DETECTED   Candida krusei NOT DETECTED NOT DETECTED   Candida parapsilosis NOT DETECTED NOT DETECTED   Candida tropicalis NOT DETECTED NOT DETECTED   Cryptococcus neoformans/gattii NOT DETECTED NOT DETECTED   CTX-M ESBL NOT DETECTED NOT DETECTED   Carbapenem resistance IMP NOT DETECTED NOT DETECTED   Carbapenem resistance KPC NOT DETECTED NOT DETECTED   Carbapenem resistance NDM NOT DETECTED NOT DETECTED   Carbapenem resist OXA 48 LIKE NOT DETECTED NOT DETECTED   Carbapenem resistance VIM NOT DETECTED NOT DETECTED    Leeroy Mace RPh 04/18/2024, 5:50 AM

## 2024-04-18 NOTE — Plan of Care (Signed)
°  Problem: Health Behavior/Discharge Planning: Goal: Ability to manage health-related needs will improve Outcome: Progressing   Problem: Clinical Measurements: Goal: Respiratory complications will improve Outcome: Progressing   Problem: Clinical Measurements: Goal: Cardiovascular complication will be avoided Outcome: Progressing

## 2024-04-19 ENCOUNTER — Inpatient Hospital Stay (HOSPITAL_COMMUNITY)

## 2024-04-19 DIAGNOSIS — R918 Other nonspecific abnormal finding of lung field: Secondary | ICD-10-CM | POA: Diagnosis not present

## 2024-04-19 DIAGNOSIS — J984 Other disorders of lung: Secondary | ICD-10-CM | POA: Diagnosis not present

## 2024-04-19 DIAGNOSIS — J9600 Acute respiratory failure, unspecified whether with hypoxia or hypercapnia: Secondary | ICD-10-CM | POA: Diagnosis not present

## 2024-04-19 DIAGNOSIS — I517 Cardiomegaly: Secondary | ICD-10-CM | POA: Diagnosis not present

## 2024-04-19 LAB — BASIC METABOLIC PANEL WITH GFR
Anion gap: 10 (ref 5–15)
BUN: 44 mg/dL — ABNORMAL HIGH (ref 8–23)
CO2: 22 mmol/L (ref 22–32)
Calcium: 8.1 mg/dL — ABNORMAL LOW (ref 8.9–10.3)
Chloride: 97 mmol/L — ABNORMAL LOW (ref 98–111)
Creatinine, Ser: 1.42 mg/dL — ABNORMAL HIGH (ref 0.44–1.00)
GFR, Estimated: 42 mL/min — ABNORMAL LOW (ref >60)
Glucose, Bld: 205 mg/dL — ABNORMAL HIGH (ref 70–99)
Potassium: 4.3 mmol/L (ref 3.5–5.1)
Sodium: 129 mmol/L — ABNORMAL LOW (ref 135–145)

## 2024-04-19 LAB — CBC WITH DIFFERENTIAL/PLATELET
Abs Immature Granulocytes: 0.09 K/uL — ABNORMAL HIGH (ref 0.00–0.07)
Basophils Absolute: 0 K/uL (ref 0.0–0.1)
Basophils Relative: 0 %
Eosinophils Absolute: 0 K/uL (ref 0.0–0.5)
Eosinophils Relative: 0 %
HCT: 28.9 % — ABNORMAL LOW (ref 36.0–46.0)
Hemoglobin: 9.4 g/dL — ABNORMAL LOW (ref 12.0–15.0)
Immature Granulocytes: 1 %
Lymphocytes Relative: 3 %
Lymphs Abs: 0.3 K/uL — ABNORMAL LOW (ref 0.7–4.0)
MCH: 27.9 pg (ref 26.0–34.0)
MCHC: 32.5 g/dL (ref 30.0–36.0)
MCV: 85.8 fL (ref 80.0–100.0)
Monocytes Absolute: 0.3 K/uL (ref 0.1–1.0)
Monocytes Relative: 3 %
Neutro Abs: 9.1 K/uL — ABNORMAL HIGH (ref 1.7–7.7)
Neutrophils Relative %: 93 %
Platelets: 47 K/uL — ABNORMAL LOW (ref 150–400)
RBC: 3.37 MIL/uL — ABNORMAL LOW (ref 3.87–5.11)
RDW: 21.2 % — ABNORMAL HIGH (ref 11.5–15.5)
WBC: 9.8 K/uL (ref 4.0–10.5)
nRBC: 0 % (ref 0.0–0.2)

## 2024-04-19 LAB — GLUCOSE, CAPILLARY
Glucose-Capillary: 186 mg/dL — ABNORMAL HIGH (ref 70–99)
Glucose-Capillary: 190 mg/dL — ABNORMAL HIGH (ref 70–99)
Glucose-Capillary: 194 mg/dL — ABNORMAL HIGH (ref 70–99)
Glucose-Capillary: 203 mg/dL — ABNORMAL HIGH (ref 70–99)
Glucose-Capillary: 177 mg/dL — ABNORMAL HIGH (ref 70–99)
Glucose-Capillary: 172 mg/dL — ABNORMAL HIGH (ref 70–99)

## 2024-04-19 LAB — MAGNESIUM: Magnesium: 2.4 mg/dL (ref 1.7–2.4)

## 2024-04-19 LAB — PHOSPHORUS: Phosphorus: 5 mg/dL — ABNORMAL HIGH (ref 2.5–4.6)

## 2024-04-19 NOTE — Progress Notes (Signed)
 Rt increased 02 to 40% due to pt having low sats.

## 2024-04-19 NOTE — Plan of Care (Signed)
  Problem: Clinical Measurements: Goal: Cardiovascular complication will be avoided Outcome: Progressing   Problem: Pain Managment: Goal: General experience of comfort will improve and/or be controlled Outcome: Progressing   Problem: Nutrition: Goal: Adequate nutrition will be maintained Outcome: Not Progressing   Problem: Elimination: Goal: Will not experience complications related to bowel motility Outcome: Not Progressing

## 2024-04-19 NOTE — Progress Notes (Signed)
 `  NAME:  Nicole Johnson, MRN:  [id number], DOB:  [birth year], LOS: 7 ADMISSION DATE:  04/11/2024, CONSULTATION DATE:  04/19/24 REFERRING MD:  Dr. Earley, CHIEF COMPLAINT:  A. Fib with RVR.   History of Present Illness:  63 y.o. female with medical history significant of metastatic pancreatic adenocarcinoma to liver, pancytopenia, cirrhosis with ascites, GERD, presented with worsening of abdominal pain likely due to large ascites (LVP on 11/19, drained 4.2 L, improved abdominal pain)  with hospital course c/b SVT requiring hemodynamic monitoring in ICU.  HPI: Patient had her routine chemotherapy last Wednesday and she has been feeling fine afterwards.  Friday, patient and her husband ate a roasted chicken for lunch and both became sick in the evening, their symptoms including abdominal pain nauseous vomiting, they attributed to possible food toxin from the chicken.  Husband however recovered the next day and the patient on the other hand continue to feel sick, she had 1 day of diarrhea and continued to have nauseous and vomiting and decreased appetite for last 3 to 4 days, she could only tolerate some Ensure, and became very dehydrated.  She denied any hematemesis or hematochezia.  Her abdominal pain mainly located on lower abdomen bilateral, constant since yesterday.  She denied any fever or chills.  She told me that she is scheduled to have platelet transfusion at [facility] cancer center today.   ED Course: Afebrile, borderline tachycardia blood pressure 92/60, O2 saturation 100% on room air.  CT abdomen pelvis showed no acute findings, moderate ascites.  Blood work showed INR 1.6, sodium 128 BUN 39 creatinine 1.0 glucose 132 albumin  2.3, WBC 9.8 hemoglobin 8.0 compared to baseline 8.2-9.6, platelet 25.  Pertinent  Medical History   Past Medical History:  Diagnosis Date   Arthritis    Cancer (HCC)    pancreatic   GERD (gastroesophageal reflux disease)    PONV (postoperative nausea and vomiting)     Prediabetes    Significant Hospital Events: Including procedures, antibiotic start and stop dates in addition to other pertinent events   11/19 --> admitted, large volume paracentesis 4.2 L drained 11/22 --> SVT, given adenosine  6 mg and 12 mg, did not abort, came down to ICU for DCCV. Given amio bolus and infusion, came out of a. Fib into NSR. 11/23 --> overnight developed another episode of a. Fib with RVR, given another bolus of amio, back to NSR. Hgb down to 6.8, 1 unit of PRBCs ordered. 11/24 rebolus Amio for RVR , albumin  given 11/25 2.5 L paracentesis after Cherina transfusion, worsening shock 11/25 ETT >> 11/26 blood cultures showing Klebsiella and Enterococcus  Interim History / Subjective:  critically ill intubated, Improving shock, on lower doses of Levophed  about 14 mics after vasopressin  added Urine output low 700 cc  Lines/Tubes: R chest port, PIVs, external urinary device, biliary stent  Objective    Blood pressure (!) 115/54, pulse 64, temperature 98.6 F (37 C), resp. rate (!) 24, height 5' 5 (1.651 m), weight 79 kg, last menstrual period 01/11/2011, SpO2 97%.    Vent Mode: PRVC FiO2 (%):  [40 %-50 %] 40 % Set Rate:  [24 bmp] 24 bmp Vt Set:  [450 mL] 450 mL PEEP:  [5 cmH20] 5 cmH20 Plateau Pressure:  [16 cmH20-21 cmH20] 19 cmH20   Intake/Output Summary (Last 24 hours) at 04/19/2024 0839 Last data filed at 04/19/2024 0759 Gross per 24 hour  Intake 4117.03 ml  Output 720 ml  Net 3397.03 ml   [employer]  04/11/24 1744 04/19/24 0500  Weight: 62.7 kg 79 kg    Examination: General: cachectic, chronically ill appearing Eyes: PERRL, no scleral icterus ENMT: Oral ETT , no JVD Skin: warm, intact, no rashes Neck: Elevated JVD, no lymphadenopathy CV: RRR, no MRG, nl S1 and S2, 2+ pitting peripheral edema Resp: Bilateral ventilated breath sounds, no accessory muscle use Abdom: Distended, fluid thrill present, nontender Neuro: Eyes open, sedate RASS 0 to  +1  Labs show worsening hyponatremia 129, BUN/creatinine 44/1.4 plateau, no leukocytosis, improving platelets 47K  Chest x-ray 11/27 shows bilateral interstitial and patchy airspace disease  Resolved problem list   Assessment and Plan   # Septic shock: Due to presumed peritonitis.  BC ID on 9/25 showing Klebsiella and Enterococcus #SBP vs. Secondary peritonitis: paracentesis yielded 1610 leukocytes, PMN 84%   Prior history of percutaneous cholecystostomy 06/2023 for perforated cholecystitis and Klebsiella bacteremia 02/2024 .  With polymicrobial bacteremia, there would be concern for GI fistula, CT abdomen was negative on 11/19 Malignant ascites status post large-volume paracentesis on 11/6, 11/20 & 11/25 - Changed antibiotics to cefepime  and ampicillin  -Repeat blood cultures pending, will need port removal if does not clear, discussed with ID - Added vasopressin  ,continue Levophed  - Added stress dose steroids    #Atrial Fibrillation with RVR: CHADS2VASC is 1.  Echo shows normal LVEF, mildly elevated RVSP - Change amiodarone  to 200 twice daily - Not a candidate for anticoagulation due to low chads2vasc and severe thrombocytopenia - Continue midodrine  10 mg TID  Acute respiratory failure with hypoxia -intubated 11/25 due to increasing oxygen requirements not relieved by paracentesis - Low tidal volume ventilation - SAT/SBT daily  #Stress Ulcer PPx: continue PPI   #AKI: Likely ATN due to septic shock, Received albumin  prior to paracentesis for volume expansion - Avoid Nephrotoxins - Strict I/Os - Diuresis at some point when pressors lower    #Severe Thrombocytopenia status post platelet transfusion 11/25 #Normocytic Anemia > Pancytopenia likely due to bone marrow suppression from malignancy/chemoRx. - Transfuse PLT for goal > 10 or 50K for procedures - Transfuse Hgb for goal > 7 mg/dL  Disposition: ICU appropriate for vasopressors and mechanical ventilation Palliative  care following. she is clearly declining and is terminally ill from advanced malignancy.  She has refractory malignant ascites and severe septic shock .patient and family desired advanced life support measures.  Husband expressed some frustration over repeated discussion of CODE STATUS, poor prognosis has been conveyed to them Husband updated at bedside daily   Labs   CBC: Recent Labs  Lab 04/15/24 1422 04/16/24 0445 04/17/24 0428 04/18/24 0342 04/19/24 0434  WBC 8.5 5.8 5.4 7.0 9.8  NEUTROABS 7.7  --  5.0 6.5 9.1*  HGB 9.5* 9.4* 9.8* 10.4* 9.4*  HCT 29.2* 28.3* 29.9* 32.6* 28.9*  MCV 87.2 85.0 84.7 87.2 85.8  PLT 29* 25* 27* 35* 47*    Basic Metabolic Panel: Recent Labs  Lab 04/15/24 0459 04/16/24 0445 04/17/24 0428 04/18/24 0342 04/19/24 0434  NA 132* 132* 130* 132* 129*  K 3.7 3.9 4.0 4.0 4.3  CL 100 101 100 99 97*  CO2 23 22 21* 20* 22  GLUCOSE 136* 104* 116* 145* 205*  BUN 38* 30* 27* 36* 44*  CREATININE 0.98 0.88 0.87 1.37* 1.42*  CALCIUM 7.8* 7.8* 8.1* 7.9* 8.1*  MG 2.1  --  1.9 2.4 2.4  PHOS 2.6  --  2.3* 4.6 5.0*   GFR: Estimated Creatinine Clearance: 42.7 mL/min (A) (by C-G formula based on SCr of  1.42 mg/dL (H)). Recent Labs  Lab 04/14/24 1200 04/14/24 1201 04/14/24 1442 04/15/24 0459 04/16/24 0445 04/17/24 0428 04/18/24 0342 04/19/24 0434  WBC  --    < >  --    < > 5.8 5.4 7.0 9.8  LATICACIDVEN 2.6*  --  1.8  --   --   --   --   --    < > = values in this interval not displayed.    Liver Function Tests: Recent Labs  Lab 04/14/24 1201 04/15/24 0459  AST 45* 34  ALT 24 19  ALKPHOS 135* 94  BILITOT 3.2* 2.6*  PROT 4.8* 4.8*  ALBUMIN  2.3* 3.0*   No results for input(s): LIPASE, AMYLASE in the last 168 hours.  No results for input(s): AMMONIA in the last 168 hours.   ABG    Component Value Date/Time   PHART 7.25 (L) 04/17/2024 1710   PCO2ART 45 04/17/2024 1710   PO2ART 115 (H) 04/17/2024 1710   HCO3 20.1 04/17/2024 1710    TCO2 23 10/08/2023 1227   ACIDBASEDEF 7.5 (H) 04/17/2024 1710   O2SAT 99.6 04/17/2024 1710     Coagulation Profile: Recent Labs  Lab 04/13/24 0020  INR 1.7*    Cardiac Enzymes: No results for input(s): CKTOTAL, CKMB, CKMBINDEX, TROPONINI in the last 168 hours.  HbA1C: Hgb A1c MFr Bld  Date/Time Value Ref Range Status  04/15/2024 04:59 AM 6.1 (H) 4.8 - 5.6 % Final    Comment:    (NOTE) Diagnosis of Diabetes The following HbA1c ranges recommended by the American Diabetes Association (ADA) may be used as an aid in the diagnosis of diabetes mellitus.  Hemoglobin             Suggested A1C NGSP%              Diagnosis  <5.7                   Non Diabetic  5.7-6.4                Pre-Diabetic  >6.4                   Diabetic  <7.0                   Glycemic control for                       adults with diabetes.    03/14/2023 09:09 AM 6.2 4.6 - 6.5 % Final    Comment:    Glycemic Control Guidelines for People with Diabetes:Non Diabetic:  <6%Goal of Therapy: <7%Additional Action Suggested:  >8%     CBG: Recent Labs  Lab 04/18/24 2013 04/18/24 2338 04/19/24 0304 04/19/24 0428 04/19/24 0743  GLUCAP 176* 185* 186* 190* 194*     My independent critical care time was 38 minutes  Harden Staff MD. FCCP. [facility] Pager : [phone]  If no response to pager , please call [phone] until 7 pm After 7:00 pm call Elink  [phone]   04/19/2024

## 2024-04-19 NOTE — Progress Notes (Signed)
   04/19/24 1227  Spiritual Encounters  Type of Visit Follow up  Care provided to: Pt and family  Referral source Chaplain assessment  Reason for visit Routine spiritual support  OnCall Visit No   I provided follow up spiritual care support for Mr Nicole Johnson who was at the bedside of his wife, Sua/ Judi remains quite sedated but did arouse and I could let her know of my presence with Nicole Johnson.  Nicole Johnson processed memories of past Thanksgiving and elaborated on aspects of their family. This includes challenge of care for Feliz's aunt in [place] where her mother and another aunt still reside. Nicole Johnson processed grief also in recalling loss of his mother. He remains hopeful and forward-looking.  I continued to build a relationship of care and support, I provided active listening and invited story sharing. I facilitated some grief process and reflection. I let him know support remains available; will continue to follow.  Nicole Johnson L. Delores HERO.Div

## 2024-04-20 ENCOUNTER — Inpatient Hospital Stay (HOSPITAL_COMMUNITY)

## 2024-04-20 HISTORY — PX: IR REMOVAL TUN ACCESS W/ PORT W/O FL MOD SED: IMG2290

## 2024-04-20 LAB — BASIC METABOLIC PANEL WITH GFR
Anion gap: 9 (ref 5–15)
BUN: 56 mg/dL — ABNORMAL HIGH (ref 8–23)
CO2: 23 mmol/L (ref 22–32)
Calcium: 7.7 mg/dL — ABNORMAL LOW (ref 8.9–10.3)
Chloride: 98 mmol/L (ref 98–111)
Creatinine, Ser: 1.52 mg/dL — ABNORMAL HIGH (ref 0.44–1.00)
GFR, Estimated: 38 mL/min — ABNORMAL LOW (ref >60)
Glucose, Bld: 171 mg/dL — ABNORMAL HIGH (ref 70–99)
Potassium: 4.1 mmol/L (ref 3.5–5.1)
Sodium: 130 mmol/L — ABNORMAL LOW (ref 135–145)

## 2024-04-20 LAB — CBC WITH DIFFERENTIAL/PLATELET
Abs Immature Granulocytes: 0.08 K/uL — ABNORMAL HIGH (ref 0.00–0.07)
Basophils Absolute: 0 K/uL (ref 0.0–0.1)
Basophils Relative: 0 %
Eosinophils Absolute: 0 K/uL (ref 0.0–0.5)
Eosinophils Relative: 0 %
HCT: 24.5 % — ABNORMAL LOW (ref 36.0–46.0)
Hemoglobin: 8.2 g/dL — ABNORMAL LOW (ref 12.0–15.0)
Immature Granulocytes: 1 %
Lymphocytes Relative: 3 %
Lymphs Abs: 0.3 K/uL — ABNORMAL LOW (ref 0.7–4.0)
MCH: 27.6 pg (ref 26.0–34.0)
MCHC: 33.5 g/dL (ref 30.0–36.0)
MCV: 82.5 fL (ref 80.0–100.0)
Monocytes Absolute: 0.2 K/uL (ref 0.1–1.0)
Monocytes Relative: 3 %
Neutro Abs: 7.4 K/uL (ref 1.7–7.7)
Neutrophils Relative %: 93 %
Platelets: 29 K/uL — CL (ref 150–400)
RBC: 2.97 MIL/uL — ABNORMAL LOW (ref 3.87–5.11)
RDW: 20.6 % — ABNORMAL HIGH (ref 11.5–15.5)
Smear Review: NORMAL
WBC: 7.9 K/uL (ref 4.0–10.5)
nRBC: 0.3 % — ABNORMAL HIGH (ref 0.0–0.2)

## 2024-04-20 LAB — GLUCOSE, CAPILLARY
Glucose-Capillary: 144 mg/dL — ABNORMAL HIGH (ref 70–99)
Glucose-Capillary: 155 mg/dL — ABNORMAL HIGH (ref 70–99)
Glucose-Capillary: 157 mg/dL — ABNORMAL HIGH (ref 70–99)
Glucose-Capillary: 164 mg/dL — ABNORMAL HIGH (ref 70–99)
Glucose-Capillary: 165 mg/dL — ABNORMAL HIGH (ref 70–99)
Glucose-Capillary: 166 mg/dL — ABNORMAL HIGH (ref 70–99)
Glucose-Capillary: 166 mg/dL — ABNORMAL HIGH (ref 70–99)

## 2024-04-20 LAB — CULTURE, RESPIRATORY W GRAM STAIN

## 2024-04-20 LAB — BODY FLUID CULTURE W GRAM STAIN
Culture: NO GROWTH
Gram Stain: NONE SEEN

## 2024-04-20 LAB — BPAM PLATELET PHERESIS
Blood Product Expiration Date: 202511282359
Unit Type and Rh: 5100

## 2024-04-20 LAB — PREPARE PLATELET PHERESIS: Unit division: 0

## 2024-04-20 LAB — MAGNESIUM: Magnesium: 2.4 mg/dL (ref 1.7–2.4)

## 2024-04-20 LAB — PLATELET COUNT: Platelets: 27 K/uL — CL (ref 150–400)

## 2024-04-20 LAB — PHOSPHORUS: Phosphorus: 4.5 mg/dL (ref 2.5–4.6)

## 2024-04-20 MED ORDER — BISACODYL 10 MG RE SUPP
10.0000 mg | Freq: Once | RECTAL | Status: AC
  Administered 2024-04-20: 10 mg via RECTAL
  Filled 2024-04-20: qty 1

## 2024-04-20 MED ORDER — ORAL CARE MOUTH RINSE
15.0000 mL | OROMUCOSAL | Status: DC
  Administered 2024-04-20 – 2024-04-21 (×13): 15 mL via OROMUCOSAL

## 2024-04-20 MED ORDER — ORAL CARE MOUTH RINSE: 15.0000 mL | OROMUCOSAL | Status: DC | PRN

## 2024-04-20 MED ORDER — LIDOCAINE-EPINEPHRINE 1 %-1:100000 IJ SOLN
20.0000 mL | Freq: Once | INTRAMUSCULAR | Status: AC
  Administered 2024-04-20: 10 mL via INTRADERMAL

## 2024-04-20 MED ORDER — ORAL CARE MOUTH RINSE
15.0000 mL | OROMUCOSAL | Status: DC
  Administered 2024-04-20: 15 mL via OROMUCOSAL

## 2024-04-20 MED ORDER — LIDOCAINE-EPINEPHRINE 1 %-1:100000 IJ SOLN
INTRAMUSCULAR | Status: AC
Start: 2024-04-20 — End: 2024-04-20
  Filled 2024-04-20: qty 1

## 2024-04-20 MED ORDER — VITAL AF 1.2 CAL PO LIQD
1000.0000 mL | ORAL | Status: DC
  Administered 2024-04-20: 1000 mL

## 2024-04-20 MED ORDER — SODIUM CHLORIDE 0.9% IV SOLUTION: Freq: Once | INTRAVENOUS | Status: AC

## 2024-04-20 MED ORDER — POLYETHYLENE GLYCOL 3350 17 G PO PACK
17.0000 g | PACK | Freq: Two times a day (BID) | ORAL | Status: DC
  Administered 2024-04-20 – 2024-04-23 (×6): 17 g
  Filled 2024-04-20 (×4): qty 1

## 2024-04-20 NOTE — Progress Notes (Signed)
 `  NAME:  Nicole Johnson, MRN:  [id number], DOB:  28-Apr-1961, LOS: 8 ADMISSION DATE:  04/11/2024, CONSULTATION DATE:  04/20/24 REFERRING MD:  Dr. Earley, CHIEF COMPLAINT:  A. Fib with RVR.   History of Present Illness:  63 y.o. female with medical history significant of metastatic pancreatic adenocarcinoma to liver, pancytopenia, cirrhosis with ascites, GERD, presented with worsening of abdominal pain likely due to large ascites (LVP on 11/19, drained 4.2 L, improved abdominal pain)  with hospital course c/b SVT requiring hemodynamic monitoring in ICU.  HPI: Patient had her routine chemotherapy last Wednesday and she has been feeling fine afterwards.  Friday, patient and her husband ate a roasted chicken for lunch and both became sick in the evening, their symptoms including abdominal pain nauseous vomiting, they attributed to possible food toxin from the chicken.  Husband however recovered the next day and the patient on the other hand continue to feel sick, she had 1 day of diarrhea and continued to have nauseous and vomiting and decreased appetite for last 3 to 4 days, she could only tolerate some Ensure, and became very dehydrated.  She denied any hematemesis or hematochezia.  Her abdominal pain mainly located on lower abdomen bilateral, constant since yesterday.  She denied any fever or chills.  She told me that she is scheduled to have platelet transfusion at [facility] cancer center today.   ED Course: Afebrile, borderline tachycardia blood pressure 92/60, O2 saturation 100% on room air.  CT abdomen pelvis showed no acute findings, moderate ascites.  Blood work showed INR 1.6, sodium 128 BUN 39 creatinine 1.0 glucose 132 albumin  2.3, WBC 9.8 hemoglobin 8.0 compared to baseline 8.2-9.6, platelet 25.  Pertinent  Medical History   Past Medical History:  Diagnosis Date   Arthritis    Cancer (HCC)    pancreatic   GERD (gastroesophageal reflux disease)    PONV (postoperative nausea and vomiting)     Prediabetes    Significant Hospital Events: Including procedures, antibiotic start and stop dates in addition to other pertinent events   11/19 --> admitted, large volume paracentesis 4.2 L drained 11/22 --> SVT, given adenosine  6 mg and 12 mg, did not abort, came down to ICU for DCCV. Given amio bolus and infusion, came out of a. Fib into NSR. 11/23 --> overnight developed another episode of a. Fib with RVR, given another bolus of amio, back to NSR. Hgb down to 6.8, 1 unit of PRBCs ordered. 11/24 rebolus Amio for RVR , albumin  given 11/25 2.5 L paracentesis after Cherina transfusion, worsening shock 11/25 ETT >> 11/26 blood cultures showing Klebsiella and Enterococcus  Interim History / Subjective:  critically ill intubated, Shock continues to improve, on low-dose Levophed  and vasopressin  Urine output remains low 530 cc, 11 L positive  Lines/Tubes: R chest port, PIVs, external urinary device, biliary stent  Objective    Blood pressure (!) 117/57, pulse 72, temperature 98.4 F (36.9 C), temperature source Esophageal, resp. rate 13, height 5' 5 (1.651 m), weight 83 kg, last menstrual period 01/11/2011, SpO2 98%.    Vent Mode: CPAP;PSV FiO2 (%):  [30 %-40 %] 40 % Set Rate:  [24 bmp] 24 bmp Vt Set:  [450 mL] 450 mL PEEP:  [5 cmH20] 5 cmH20 Pressure Support:  [10 cmH20] 10 cmH20 Plateau Pressure:  [16 cmH20-21 cmH20] 19 cmH20   Intake/Output Summary (Last 24 hours) at 04/20/2024 1010 Last data filed at 04/20/2024 0901 Gross per 24 hour  Intake 1891.2 ml  Output 475 ml  Net 1416.2 ml   Filed Weights   04/11/24 1744 04/19/24 0500 04/20/24 0500  Weight: 62.7 kg 79 kg 83 kg    Examination: General: cachectic, chronically ill appearing Eyes: PERRL, no scleral icterus ENMT: Oral ETT , no JVD Skin: warm, intact, no rashes Neck: Elevated JVD, no lymphadenopathy CV: RRR, no MRG, nl S1 and S2, 2+ pitting peripheral edema Resp: Bilateral ventilated breath sounds, decreased  bilateral, no accessory muscle use Abdom: Distended, fluid thrill present, nontender Neuro: Awake, follows one-step commands, RASS 0 to +1  Labs show stable hyponatremia, renal function 56/1.5, slight drop in hemoglobin to 8.2, platelets low at 29K  Chest x-ray 11/27 shows bilateral interstitial and patchy airspace disease  Resolved problem list   Assessment and Plan   # Septic shock: Due to presumed peritonitis.  BC ID on 9/25 showing Klebsiella and Enterococcus #SBP vs. Secondary peritonitis: paracentesis yielded 1610 leukocytes, PMN 84%   Prior history of percutaneous cholecystostomy 06/2023 for perforated cholecystitis and Klebsiella bacteremia 02/2024 .  With polymicrobial bacteremia, there would be concern for GI fistula, CT abdomen was negative on 11/19 Malignant ascites status post large-volume paracentesis on 11/6, 11/20 & 11/25 - Changed antibiotics to cefepime  and ampicillin , ID following -Repeat blood cultures pending, ID recommends port removal since 3/4 cultures positive , will obtain alternative left IJ access - Taper off pressors -  stress dose steroids can be DC'd once off pressors    #Atrial Fibrillation with RVR: CHADS2VASC is 1.  Echo shows normal LVEF, mildly elevated RVSP - Change amiodarone  to 200 twice daily - Not a candidate for anticoagulation due to low chads2vasc and severe thrombocytopenia - Continue midodrine  10 mg TID  Acute respiratory failure with hypoxia -intubated 11/25 due to increasing oxygen requirements not relieved by paracentesis - Low tidal volume ventilation - SAT/SBT daily, hold off extubation until procedures today  #Stress Ulcer PPx: continue PPI   #AKI: Likely ATN due to septic shock, Received albumin  prior to paracentesis for volume expansion - Avoid Nephrotoxins - Strict I/Os - Diuresis at some point when pressors lower    #Severe Thrombocytopenia status post platelet transfusion 11/25 #Normocytic Anemia > Pancytopenia  likely due to bone marrow suppression from malignancy/chemoRx. - Transfuse PLT for goal > 10 or 50K for procedures - Transfuse Hgb for goal > 7 mg/dL  Disposition: ICU appropriate for vasopressors and mechanical ventilation Palliative care assisting. she is clearly declining and is terminally ill from advanced malignancy.  She has refractory malignant ascites and severe septic shock .patient and family desired advanced life support measures.  Husband has expressed frustration over repeated discussion of CODE STATUS, poor prognosis has been conveyed to them.  She does seem to be recovering somewhat and may survive acute episode Husband updated at bedside daily   Labs   CBC: Recent Labs  Lab 04/15/24 1422 04/16/24 0445 04/17/24 0428 04/18/24 0342 04/19/24 0434 04/20/24 0613  WBC 8.5 5.8 5.4 7.0 9.8 7.9  NEUTROABS 7.7  --  5.0 6.5 9.1* 7.4  HGB 9.5* 9.4* 9.8* 10.4* 9.4* 8.2*  HCT 29.2* 28.3* 29.9* 32.6* 28.9* 24.5*  MCV 87.2 85.0 84.7 87.2 85.8 82.5  PLT 29* 25* 27* 35* 47* 29*    Basic Metabolic Panel: Recent Labs  Lab 04/15/24 0459 04/16/24 0445 04/17/24 0428 04/18/24 0342 04/19/24 0434 04/20/24 0613  NA 132* 132* 130* 132* 129* 130*  K 3.7 3.9 4.0 4.0 4.3 4.1  CL 100 101 100 99 97* 98  CO2 23 22 21* 20*  22 23  GLUCOSE 136* 104* 116* 145* 205* 171*  BUN 38* 30* 27* 36* 44* 56*  CREATININE 0.98 0.88 0.87 1.37* 1.42* 1.52*  CALCIUM 7.8* 7.8* 8.1* 7.9* 8.1* 7.7*  MG 2.1  --  1.9 2.4 2.4 2.4  PHOS 2.6  --  2.3* 4.6 5.0* 4.5   GFR: Estimated Creatinine Clearance: 40.8 mL/min (A) (by C-G formula based on SCr of 1.52 mg/dL (H)). Recent Labs  Lab 04/14/24 1200 04/14/24 1201 04/14/24 1442 04/15/24 0459 04/17/24 0428 04/18/24 0342 04/19/24 0434 04/20/24 0613  WBC  --    < >  --    < > 5.4 7.0 9.8 7.9  LATICACIDVEN 2.6*  --  1.8  --   --   --   --   --    < > = values in this interval not displayed.    Liver Function Tests: Recent Labs  Lab 04/14/24 1201  04/15/24 0459  AST 45* 34  ALT 24 19  ALKPHOS 135* 94  BILITOT 3.2* 2.6*  PROT 4.8* 4.8*  ALBUMIN  2.3* 3.0*   No results for input(s): LIPASE, AMYLASE in the last 168 hours.  No results for input(s): AMMONIA in the last 168 hours.   ABG    Component Value Date/Time   PHART 7.25 (L) 04/17/2024 1710   PCO2ART 45 04/17/2024 1710   PO2ART 115 (H) 04/17/2024 1710   HCO3 20.1 04/17/2024 1710   TCO2 23 10/08/2023 1227   ACIDBASEDEF 7.5 (H) 04/17/2024 1710   O2SAT 99.6 04/17/2024 1710     Coagulation Profile: No results for input(s): INR, PROTIME in the last 168 hours.   Cardiac Enzymes: No results for input(s): CKTOTAL, CKMB, CKMBINDEX, TROPONINI in the last 168 hours.  HbA1C: Hgb A1c MFr Bld  Date/Time Value Ref Range Status  04/15/2024 04:59 AM 6.1 (H) 4.8 - 5.6 % Final    Comment:    (NOTE) Diagnosis of Diabetes The following HbA1c ranges recommended by the American Diabetes Association (ADA) may be used as an aid in the diagnosis of diabetes mellitus.  Hemoglobin             Suggested A1C NGSP%              Diagnosis  <5.7                   Non Diabetic  5.7-6.4                Pre-Diabetic  >6.4                   Diabetic  <7.0                   Glycemic control for                       adults with diabetes.    03/14/2023 09:09 AM 6.2 4.6 - 6.5 % Final    Comment:    Glycemic Control Guidelines for People with Diabetes:Non Diabetic:  <6%Goal of Therapy: <7%Additional Action Suggested:  >8%     CBG: Recent Labs  Lab 04/19/24 1610 04/19/24 2006 04/20/24 0023 04/20/24 0348 04/20/24 0740  GLUCAP 177* 172* 166* 157* 155*     My independent critical care time was 34 minutes  Harden Staff MD. FCCP. [facility] Pager : [phone]  If no response to pager , please call [phone] until 7 pm After 7:00 pm call  Elink  [phone]   04/20/2024

## 2024-04-20 NOTE — Progress Notes (Signed)
 PCCM progress note  Notified of several episodes of small-volume emesis/spitting up of tube feed colored GI contents.  Per chart review patient had a small type 7 stool 11/25.  Patient also had abdominal x-ray which revealed moderate stool burden same day 11/25.  Will proceed with Dulcolax suppository today and hold tube feeds until tomorrow morning.   10 minutes additional critical care time Douglas Rooks D. Harris, NP-C [facility] Personal contact information can be found on Amion  If no contact or response made please call [phone] 04/20/2024, 2:07 PM

## 2024-04-20 NOTE — Plan of Care (Signed)
  Problem: Clinical Measurements: Goal: Cardiovascular complication will be avoided Outcome: Progressing   Problem: Clinical Measurements: Goal: Respiratory complications will improve Outcome: Not Progressing   Problem: Nutrition: Goal: Adequate nutrition will be maintained Outcome: Not Progressing   Problem: Elimination: Goal: Will not experience complications related to bowel motility Outcome: Not Progressing   Problem: Skin Integrity: Goal: Risk for impaired skin integrity will decrease Outcome: Not Progressing   Problem: Fluid Volume: Goal: Ability to maintain a balanced intake and output will improve Outcome: Not Progressing

## 2024-04-20 NOTE — Procedures (Signed)
  Procedure:  R chest port catheter removal tip sent for culture Preprocedure diagnosis: The primary encounter diagnosis was Thrombocytopenia. Diagnoses of Diarrhea, unspecified type, Abdominal pain, unspecified abdominal location, Other ascites, Aplastic anemia due to drugs, and Anemia, unspecified type were also pertinent to this visit. Postprocedure diagnosis: same EBL:    minimal Complications:   none immediate  See full dictation in [employer].  CHARM Toribio Faes MD Main # [phone] Pager  [phone] Mobile [phone]

## 2024-04-20 NOTE — Progress Notes (Signed)
 Nutrition Follow-up  DOCUMENTATION CODES:   Non-severe (moderate) malnutrition in context of chronic illness  INTERVENTION:  - Begin advancing tube feeds to goal as below: Vital 1.2 at 60 ml/h (1080 ml per day) *Start at 46mL/hr and advance by 10mL Q12H Provides 1728 kcal, 108 gm protein, 1168 ml free water  daily  - Monitor magnesium , potassium, and phosphorus daily for at least 3 days, MD to replete as needed, as pt is at risk for refeeding syndrome.   - FWF per CCM.    - Monitor weight trends.   NUTRITION DIAGNOSIS:   Moderate Malnutrition related to chronic illness (metastatic pancreatic adenocarcinoma to liver; cirrhosis with ascites) as evidenced by moderate fat depletion, moderate muscle depletion. *ongoing  GOAL:   Patient will meet greater than or equal to 90% of their needs *progressing, on TF  MONITOR:   Vent status, Labs, Weight trends, TF tolerance  REASON FOR ASSESSMENT:   Consult Enteral/tube feeding initiation and management (Trickle TF)  ASSESSMENT:   63 y.o. female with PMH significant of metastatic pancreatic adenocarcinoma to liver, pancytopenia, cirrhosis with ascites, GERD who presented with worsening abdominal pain likely due to large ascites.  11/19 paracentesis with 4.2L removed 11/20 Admit 11/25 paracentesis with 2.5L removed; Intubated 11/26 Trickle TF initiated  Patient is currently intubated on ventilator support MV: 10.3 L/min Temp (24hrs), Avg:97.8 F (36.6 C), Min:96.3 F (35.7 C), Max:98.6 F (37 C)  Patient tolerating trickle tube feeds.  Per discussion with CCM, can begin advancing tube feeds slowly to goal today.  Discussed plan with RN.   Admit weight: 138# Current weight: 183# I&O's: +12.3L + for edema   Medications reviewed and include: Colace, Miralax , Protonix , 100mg  thiamine  Levophed  @ 3 mcg/min Vasopressin  @ 0.03 units/min   Labs reviewed:  Na 130 Creatinine 1.52 HA1C 6.1  Diet Order:   Diet Order              Diet NPO time specified  Diet effective now                   EDUCATION NEEDS:  Education needs have been addressed  Skin:  Skin Assessment: Skin Integrity Issues: Skin Integrity Issues:: DTI DTI: Mid Buttocks; Left and Right Heel  Last BM:  11/25 - type 7  Height:  Ht Readings from Last 1 Encounters:  04/17/24 5' 5 (1.651 m)   Weight:  Wt Readings from Last 1 Encounters:  04/20/24 83 kg   Ideal Body Weight:  56.82 kg  BMI:  Body mass index is 30.45 kg/m.  Estimated Nutritional Needs:  Kcal:  1700-1900 kcals Protein:  95-115 grams Fluid:  >/= 1.7L    Trude Ned RD, LDN Contact via Secure Chat.

## 2024-04-20 NOTE — Procedures (Signed)
 Central Venous Catheter Insertion Procedure Note  Nicole Johnson  [id number]  23-Jul-1960  Date:04/20/24  Time:12:00 PM   Provider Performing:Makarios Madlock D. Harris   Procedure: Insertion of Non-tunneled Central Venous [phone]) with US  guidance (23062)   Indication(s) Medication administration and Difficult access  Consent Risks of the procedure as well as the alternatives and risks of each were explained to the patient and/or caregiver.  Consent for the procedure was obtained and is signed in the bedside chart  Anesthesia Topical only with 1% lidocaine    Timeout Verified patient identification, verified procedure, site/side was marked, verified correct patient position, special equipment/implants available, medications/allergies/relevant history reviewed, required imaging and test results available.  Sterile Technique Maximal sterile technique including full sterile barrier drape, hand hygiene, sterile gown, sterile gloves, mask, hair covering, sterile ultrasound probe cover (if used).  Procedure Description Area of catheter insertion was cleaned with chlorhexidine  and draped in sterile fashion.  With real-time ultrasound guidance a central venous catheter was placed into the left internal jugular vein. Nonpulsatile blood flow and easy flushing noted in all ports.  The catheter was sutured in place and sterile dressing applied.  Complications/Tolerance None; patient tolerated the procedure well. Chest X-ray is ordered to verify placement for internal jugular or subclavian cannulation.   Chest x-ray is not ordered for femoral cannulation.  EBL Minimal  Specimen(s) None  Nicole Johnson D. Harris, NP-C [facility] Personal contact information can be found on Amion  If no contact or response made please call [phone] 04/20/2024, 12:00 PM

## 2024-04-20 NOTE — Plan of Care (Incomplete)

## 2024-04-20 NOTE — Plan of Care (Signed)
 Id brief update  11/26 repeat bcx ngtd Chemo/metastatic pancreatic cancer Cirrhosis Presence of port   Polymicrobial bacteremia - e faecalis/gallinarum/kleb aerogenes Septic shock Respiratory failure  Shock improving   Source unclear, could be port  Abd pain but ascites fluid negative and no sign of ruptured viscus    -pending port removal -continue amp/cefepime  -will need tee to r/o endocarditis due to presence of enterococcus bacteremia -discussed with team

## 2024-04-20 NOTE — Progress Notes (Signed)
 eLink Physician-Brief Progress Note Patient Name: Nicole Johnson DOB: 1961-04-14 MRN: [id number]   Date of Service  04/20/2024  HPI/Events of Note  Plt 27  eICU Interventions  Stable. No evidence of bleed.      Intervention Category Intermediate Interventions: Thrombocytopenia - evaluation and management  Adonus Uselman Slater Staff 04/20/2024, 10:49 PM

## 2024-04-21 LAB — BLOOD CULTURE ID PANEL (REFLEXED) - BCID2

## 2024-04-21 LAB — GLUCOSE, CAPILLARY
Glucose-Capillary: 155 mg/dL — ABNORMAL HIGH (ref 70–99)
Glucose-Capillary: 146 mg/dL — ABNORMAL HIGH (ref 70–99)
Glucose-Capillary: 143 mg/dL — ABNORMAL HIGH (ref 70–99)
Glucose-Capillary: 132 mg/dL — ABNORMAL HIGH (ref 70–99)
Glucose-Capillary: 109 mg/dL — ABNORMAL HIGH (ref 70–99)

## 2024-04-21 LAB — CBC WITH DIFFERENTIAL/PLATELET
Abs Immature Granulocytes: 0.04 K/uL (ref 0.00–0.07)
Basophils Absolute: 0 K/uL (ref 0.0–0.1)
Basophils Relative: 0 %
Eosinophils Absolute: 0 K/uL (ref 0.0–0.5)
Eosinophils Relative: 0 %
HCT: 22.5 % — ABNORMAL LOW (ref 36.0–46.0)
Hemoglobin: 7.4 g/dL — ABNORMAL LOW (ref 12.0–15.0)
Immature Granulocytes: 1 %
Lymphocytes Relative: 4 %
Lymphs Abs: 0.2 K/uL — ABNORMAL LOW (ref 0.7–4.0)
MCH: 27.8 pg (ref 26.0–34.0)
MCHC: 32.9 g/dL (ref 30.0–36.0)
MCV: 84.6 fL (ref 80.0–100.0)
Monocytes Absolute: 0.2 K/uL (ref 0.1–1.0)
Monocytes Relative: 4 %
Neutro Abs: 5.2 K/uL (ref 1.7–7.7)
Neutrophils Relative %: 91 %
Platelets: 26 K/uL — CL (ref 150–400)
RBC: 2.66 MIL/uL — ABNORMAL LOW (ref 3.87–5.11)
RDW: 20.3 % — ABNORMAL HIGH (ref 11.5–15.5)
Smear Review: NORMAL
WBC: 5.6 K/uL (ref 4.0–10.5)
nRBC: 0 % (ref 0.0–0.2)

## 2024-04-21 LAB — BASIC METABOLIC PANEL WITH GFR
Anion gap: 8 (ref 5–15)
BUN: 59 mg/dL — ABNORMAL HIGH (ref 8–23)
CO2: 24 mmol/L (ref 22–32)
Calcium: 7.8 mg/dL — ABNORMAL LOW (ref 8.9–10.3)
Chloride: 101 mmol/L (ref 98–111)
Creatinine, Ser: 1.44 mg/dL — ABNORMAL HIGH (ref 0.44–1.00)
GFR, Estimated: 41 mL/min — ABNORMAL LOW (ref >60)
Glucose, Bld: 151 mg/dL — ABNORMAL HIGH (ref 70–99)
Potassium: 3.9 mmol/L (ref 3.5–5.1)
Sodium: 133 mmol/L — ABNORMAL LOW (ref 135–145)

## 2024-04-21 LAB — PHOSPHORUS: Phosphorus: 4.3 mg/dL (ref 2.5–4.6)

## 2024-04-21 LAB — MAGNESIUM: Magnesium: 2.5 mg/dL — ABNORMAL HIGH (ref 1.7–2.4)

## 2024-04-21 MED ORDER — DEXMEDETOMIDINE HCL IN NACL 400 MCG/100ML IV SOLN
0.0000 ug/kg/h | INTRAVENOUS | Status: DC
  Administered 2024-04-21: 0.7 ug/kg/h via INTRAVENOUS
  Administered 2024-04-21: 0.4 ug/kg/h via INTRAVENOUS
  Administered 2024-04-22: 0.5 ug/kg/h via INTRAVENOUS
  Filled 2024-04-21 (×3): qty 100

## 2024-04-21 MED ORDER — HYDROCORTISONE SOD SUC (PF) 100 MG IJ SOLR: 100.0000 mg | Freq: Every day | INTRAMUSCULAR | Status: DC

## 2024-04-21 MED ORDER — ORAL CARE MOUTH RINSE
15.0000 mL | OROMUCOSAL | Status: DC
  Administered 2024-04-21 – 2024-04-25 (×50): 15 mL via OROMUCOSAL

## 2024-04-21 MED ORDER — SODIUM CHLORIDE 0.9 % IV SOLN
100.0000 mg | INTRAVENOUS | Status: DC
  Administered 2024-04-21 – 2024-04-26 (×6): 100 mg via INTRAVENOUS
  Filled 2024-04-21 (×7): qty 5

## 2024-04-21 MED ORDER — ORAL CARE MOUTH RINSE: 15.0000 mL | OROMUCOSAL | Status: DC | PRN

## 2024-04-21 MED ORDER — OXYCODONE HCL 5 MG PO TABS: 5.0000 mg | ORAL_TABLET | ORAL | Status: DC | PRN

## 2024-04-21 MED ORDER — LACTULOSE 10 GM/15ML PO SOLN
30.0000 g | Freq: Once | ORAL | Status: AC
  Administered 2024-04-21: 30 g
  Filled 2024-04-21: qty 45

## 2024-04-21 MED ORDER — FUROSEMIDE 10 MG/ML IJ SOLN
40.0000 mg | Freq: Once | INTRAMUSCULAR | Status: AC
  Administered 2024-04-21: 40 mg via INTRAVENOUS
  Filled 2024-04-21: qty 4

## 2024-04-21 MED ORDER — PROMETHAZINE HCL 25 MG PO TABS: 12.5000 mg | ORAL_TABLET | Freq: Four times a day (QID) | ORAL | Status: DC | PRN

## 2024-04-21 NOTE — Progress Notes (Addendum)
 `  NAME:  Nicole Johnson, MRN:  [id number], DOB:  1960-08-29, LOS: 9 ADMISSION DATE:  04/11/2024, CONSULTATION DATE:  04/21/24 REFERRING MD:  Dr. Earley, CHIEF COMPLAINT:  A. Fib with RVR.   History of Present Illness:  63 y.o. female with medical history significant of metastatic pancreatic adenocarcinoma to liver, pancytopenia, cirrhosis with ascites, GERD, presented with worsening of abdominal pain likely due to large ascites (LVP on 11/19, drained 4.2 L, improved abdominal pain)  with hospital course c/b SVT requiring hemodynamic monitoring in ICU.  HPI: Patient had her routine chemotherapy last Wednesday and she has been feeling fine afterwards.  Friday, patient and her husband ate a roasted chicken for lunch and both became sick in the evening, their symptoms including abdominal pain nauseous vomiting, they attributed to possible food toxin from the chicken.  Husband however recovered the next day and the patient on the other hand continue to feel sick, she had 1 day of diarrhea and continued to have nauseous and vomiting and decreased appetite for last 3 to 4 days, she could only tolerate some Ensure, and became very dehydrated.  She denied any hematemesis or hematochezia.  Her abdominal pain mainly located on lower abdomen bilateral, constant since yesterday.  She denied any fever or chills.  She told me that she is scheduled to have platelet transfusion at [facility] today.   ED Course: Afebrile, borderline tachycardia blood pressure 92/60, O2 saturation 100% on room air.  CT abdomen pelvis showed no acute findings, moderate ascites.  Blood work showed INR 1.6, sodium 128 BUN 39 creatinine 1.0 glucose 132 albumin  2.3, WBC 9.8 hemoglobin 8.0 compared to baseline 8.2-9.6, platelet 25.  Pertinent  Medical History   Past Medical History:  Diagnosis Date   Arthritis    Cancer (HCC)    pancreatic   GERD (gastroesophageal reflux disease)    PONV (postoperative nausea and vomiting)     Prediabetes    Significant Hospital Events: Including procedures, antibiotic start and stop dates in addition to other pertinent events   11/19 --> admitted, large volume paracentesis 4.2 L drained 11/22 --> SVT, given adenosine  6 mg and 12 mg, did not abort, came down to ICU for DCCV. Given amio bolus and infusion, came out of a. Fib into NSR. 11/23 --> overnight developed another episode of a. Fib with RVR, given another bolus of amio, back to NSR. Hgb down to 6.8, 1 unit of PRBCs ordered. 11/24 rebolus Amio for RVR , albumin  given 11/25 2.5 L paracentesis after Cherina transfusion, worsening shock 11/25 ETT >> 11/26 blood cultures showing Klebsiella and Enterococcus >> abx to cefepime  plus ampicillin  11/27 Amio to oral 11/28 platelet transfusion, Port-A-Cath DC'd, left IJ placed  Interim History / Subjective:  critically ill intubated, Remains on low-dose Levophed  and vasopressin  Urine output picking up 800 cc but remains 13 L positive Runs of A-fib this morning spontaneously reverted  Lines/Tubes: R chest port, PIVs, external urinary device, biliary stent  Objective    Blood pressure 137/74, pulse 88, temperature 98.1 F (36.7 C), resp. rate 16, height 5' 5 (1.651 m), weight 83 kg, last menstrual period 01/11/2011, SpO2 97%.    Vent Mode: PRVC FiO2 (%):  [35 %-40 %] 40 % Set Rate:  [24 bmp] 24 bmp Vt Set:  [450 mL] 450 mL PEEP:  [5 cmH20] 5 cmH20 Pressure Support:  [10 cmH20] 10 cmH20 Plateau Pressure:  [15 cmH20] 15 cmH20   Intake/Output Summary (Last 24 hours) at 04/21/2024 [phone]  Last data filed at 04/21/2024 9185 Gross per 24 hour  Intake 3508.09 ml  Output 800 ml  Net 2708.09 ml   Filed Weights   04/11/24 1744 04/19/24 0500 04/20/24 0500  Weight: 62.7 kg 79 kg 83 kg    Examination: General: cachectic, chronically ill appearing, turned to her left side Eyes: PERRL, no scleral icterus ENMT: Oral ETT , no JVD Skin: warm, intact, no rashes Neck: Elevated JVD,  no lymphadenopathy CV: RRR, no MRG, nl S1 and S2, 2+ pitting peripheral edema Resp: No accessory muscle use bilateral ventilated breath sounds Abdom: Nontender, distended, fluid thrill present Neuro: Awake, follows one-step commands, RASS -1 to +1  Labs show improving hyponatremia 133, renal function 59/1.4, no leukocytosis, hemoglobin continues to drift down to 7.4, platelets 26K  Chest x-ray 11/28 shows new left IJ in position, unchanged bilateral interstitial and airspace disease  Resolved problem list   Assessment and Plan   # Septic shock: Due to presumed peritonitis.  Polymicrobial bacteremia - Klebsiella, E. gallinarum and E.  Faecalis #SBP vs. Secondary peritonitis: paracentesis yielded 1610 leukocytes, PMN 84%   Prior history of percutaneous cholecystostomy 06/2023 for perforated cholecystitis and Klebsiella bacteremia 02/2024 .  With polymicrobial bacteremia, there would be concern for GI fistula, CT abdomen was negative on 11/19.  Port-A-Cath DC'd 11/28  - Changed antibiotics to cefepime  and ampicillin , ID following - Taper off pressors - Continue midodrine  10 mg TID -  stress dose steroids can be DC'd once off pressors    #Atrial Fibrillation with RVR: CHADS2VASC is 1.  Echo shows normal LVEF, mildly elevated RVSP - 11/27 Changed amiodarone  to 200 twice daily -keep at this dose and still having some breakthrough events - Not a candidate for anticoagulation due to low chads2vasc and severe thrombocytopenia   Acute respiratory failure with hypoxia -intubated 11/25 due to increasing oxygen requirements not relieved by paracentesis - Low tidal volume ventilation - SAT/SBT daily, will need diuresis prior to extubation - PAD protocol, add Precedex  to help minimize fentanyl  drip, goal RASS 0  #Stress Ulcer PPx: continue PPI   #AKI: Likely ATN due to septic shock, Received albumin  prior to paracentesis for volume expansion - Avoid Nephrotoxins - Strict I/Os - Lasix  40 IV  today, if good response will consider drip  Malignant ascites status post large-volume paracentesis on 11/6, 11/20 & 11/25 Ileus due to high-dose fentanyl  -Minimize fentanyl  drip -Continue bowel regimen, add lactulose 30 x 1 today  #Severe Thrombocytopenia status post platelet transfusion 11/25 #Normocytic Anemia > Pancytopenia likely due to bone marrow suppression from malignancy/chemoRx. - Transfuse PLT for goal > 10 or 50K for procedures - Transfuse Hgb for goal > 7 mg/dL  Disposition: ICU appropriate for vasopressors and mechanical ventilation Palliative care assisting. She is terminally ill from advanced malignancy.  She has refractory malignant ascites and septic shock with polymicrobial bacteremia.patient and family desired advanced life support measures.  Husband has expressed frustration over repeated discussion of CODE STATUS, poor prognosis has been conveyed to them.  She does seem to be recovering somewhat and may survive acute episode Husband updated at bedside daily   Labs   CBC: Recent Labs  Lab 04/17/24 0428 04/18/24 0342 04/19/24 0434 04/20/24 0613 04/20/24 2159 04/21/24 0538  WBC 5.4 7.0 9.8 7.9  --  5.6  NEUTROABS 5.0 6.5 9.1* 7.4  --  5.2  HGB 9.8* 10.4* 9.4* 8.2*  --  7.4*  HCT 29.9* 32.6* 28.9* 24.5*  --  22.5*  MCV 84.7 87.2  85.8 82.5  --  84.6  PLT 27* 35* 47* 29* 27* 26*    Basic Metabolic Panel: Recent Labs  Lab 04/17/24 0428 04/18/24 0342 04/19/24 0434 04/20/24 0613 04/21/24 0538  NA 130* 132* 129* 130* 133*  K 4.0 4.0 4.3 4.1 3.9  CL 100 99 97* 98 101  CO2 21* 20* 22 23 24   GLUCOSE 116* 145* 205* 171* 151*  BUN 27* 36* 44* 56* 59*  CREATININE 0.87 1.37* 1.42* 1.52* 1.44*  CALCIUM 8.1* 7.9* 8.1* 7.7* 7.8*  MG 1.9 2.4 2.4 2.4 2.5*  PHOS 2.3* 4.6 5.0* 4.5 4.3   GFR: Estimated Creatinine Clearance: 43.1 mL/min (A) (by C-G formula based on SCr of 1.44 mg/dL (H)). Recent Labs  Lab 04/14/24 1200 04/14/24 1201 04/14/24 1442  04/15/24 0459 04/18/24 0342 04/19/24 0434 04/20/24 0613 04/21/24 0538  WBC  --    < >  --    < > 7.0 9.8 7.9 5.6  LATICACIDVEN 2.6*  --  1.8  --   --   --   --   --    < > = values in this interval not displayed.    Liver Function Tests: Recent Labs  Lab 04/14/24 1201 04/15/24 0459  AST 45* 34  ALT 24 19  ALKPHOS 135* 94  BILITOT 3.2* 2.6*  PROT 4.8* 4.8*  ALBUMIN  2.3* 3.0*   No results for input(s): LIPASE, AMYLASE in the last 168 hours.  No results for input(s): AMMONIA in the last 168 hours.   ABG    Component Value Date/Time   PHART 7.25 (L) 04/17/2024 1710   PCO2ART 45 04/17/2024 1710   PO2ART 115 (H) 04/17/2024 1710   HCO3 20.1 04/17/2024 1710   TCO2 23 10/08/2023 1227   ACIDBASEDEF 7.5 (H) 04/17/2024 1710   O2SAT 99.6 04/17/2024 1710     Coagulation Profile: No results for input(s): INR, PROTIME in the last 168 hours.   Cardiac Enzymes: No results for input(s): CKTOTAL, CKMB, CKMBINDEX, TROPONINI in the last 168 hours.  HbA1C: Hgb A1c MFr Bld  Date/Time Value Ref Range Status  04/15/2024 04:59 AM 6.1 (H) 4.8 - 5.6 % Final    Comment:    (NOTE) Diagnosis of Diabetes The following HbA1c ranges recommended by the American Diabetes Association (ADA) may be used as an aid in the diagnosis of diabetes mellitus.  Hemoglobin             Suggested A1C NGSP%              Diagnosis  <5.7                   Non Diabetic  5.7-6.4                Pre-Diabetic  >6.4                   Diabetic  <7.0                   Glycemic control for                       adults with diabetes.    03/14/2023 09:09 AM 6.2 4.6 - 6.5 % Final    Comment:    Glycemic Control Guidelines for People with Diabetes:Non Diabetic:  <6%Goal of Therapy: <7%Additional Action Suggested:  >8%     CBG: Recent Labs  Lab 04/20/24 1514 04/20/24 2001 04/20/24 2355 04/21/24 0341 04/21/24 9272  GLUCAP 166* 164* 144* 155* 146*     My independent critical care  time was 35 minutes  Harden Staff MD. FCCP. [facility] Pager : [phone]  If no response to pager , please call [phone] until 7 pm After 7:00 pm call Elink  [phone]   04/21/2024

## 2024-04-21 NOTE — Plan of Care (Signed)
 Episodes of A fib with rate of 130-150 for a approx 5 minutes. Versed  2 mg x 3. Fentanyl   @ 175  Levo @ 2

## 2024-04-21 NOTE — Plan of Care (Incomplete)
  Problem: Education: Goal: Knowledge of General Education information will improve Description: Including pain rating scale, medication(s)/side effects and non-pharmacologic comfort measures Outcome: Not Progressing   Problem: Health Behavior/Discharge Planning: Goal: Ability to manage health-related needs will improve Outcome: Not Progressing   Problem: Clinical Measurements: Goal: Ability to maintain clinical measurements within normal limits will improve Outcome: Not Progressing Goal: Will remain free from infection Outcome: Not Progressing Goal: Diagnostic test results will improve Outcome: Not Progressing Goal: Respiratory complications will improve Outcome: Not Progressing Goal: Cardiovascular complication will be avoided Outcome: Not Progressing   Problem: Activity: Goal: Risk for activity intolerance will decrease Outcome: Not Progressing   Problem: Nutrition: Goal: Adequate nutrition will be maintained Outcome: Not Progressing   Problem: Coping: Goal: Level of anxiety will decrease Outcome: Not Progressing   Problem: Elimination: Goal: Will not experience complications related to bowel motility Outcome: Not Progressing Goal: Will not experience complications related to urinary retention Outcome: Not Progressing   Problem: Pain Managment: Goal: General experience of comfort will improve and/or be controlled Outcome: Not Progressing   Problem: Safety: Goal: Ability to remain free from injury will improve Outcome: Not Progressing   Problem: Skin Integrity: Goal: Risk for impaired skin integrity will decrease Outcome: Not Progressing   Problem: Education: Goal: Ability to describe self-care measures that may prevent or decrease complications (Diabetes Survival Skills Education) will improve Outcome: Not Progressing Goal: Individualized Educational Video(s) Outcome: Not Progressing   Problem: Coping: Goal: Ability to adjust to condition or change in  health will improve Outcome: Not Progressing   Problem: Fluid Volume: Goal: Ability to maintain a balanced intake and output will improve Outcome: Not Progressing   Problem: Health Behavior/Discharge Planning: Goal: Ability to identify and utilize available resources and services will improve Outcome: Not Progressing Goal: Ability to manage health-related needs will improve Outcome: Not Progressing   Problem: Metabolic: Goal: Ability to maintain appropriate glucose levels will improve Outcome: Not Progressing   Problem: Nutritional: Goal: Maintenance of adequate nutrition will improve Outcome: Not Progressing Goal: Progress toward achieving an optimal weight will improve Outcome: Not Progressing   Problem: Skin Integrity: Goal: Risk for impaired skin integrity will decrease Outcome: Not Progressing   Problem: Tissue Perfusion: Goal: Adequacy of tissue perfusion will improve Outcome: Not Progressing   Problem: Activity: Goal: Ability to tolerate increased activity will improve Outcome: Not Progressing   Problem: Respiratory: Goal: Ability to maintain a clear airway and adequate ventilation will improve Outcome: Not Progressing   Problem: Role Relationship: Goal: Method of communication will improve Outcome: Not Progressing

## 2024-04-21 NOTE — Progress Notes (Signed)
 Daily Progress Note   Patient Name: Nicole Johnson       Date: 04/21/2024 DOB: 04-May-1961  Age: 63 y.o. MRN#: [id number] Attending Physician: Jude Harden GAILS, MD Primary Care Physician: Tower, Laine DELENA, MD Admit Date: 04/11/2024  Reason for Consultation/Follow-up: Establishing goals of care   Length of Stay: 9  Current Medications: Scheduled Meds:   amiodarone   200 mg Per Tube BID   Chlorhexidine  Gluconate Cloth  6 each Topical Q0600   docusate  100 mg Per Tube BID   [START ON 04/22/2024] hydrocortisone  sod succinate (SOLU-CORTEF ) inj  100 mg Intravenous Daily   insulin  aspart  0-9 Units Subcutaneous Q4H   midodrine   10 mg Per Tube TID WC   mouth rinse  15 mL Mouth Rinse Q2H   pantoprazole  (PROTONIX ) IV  40 mg Intravenous Q12H   polyethylene glycol  17 g Per Tube BID   sodium chloride  flush  10-40 mL Intracatheter Q12H   thiamine  (VITAMIN B1) injection  100 mg Intravenous Daily    Continuous Infusions:  ampicillin  (OMNIPEN) IV Stopped (04/21/24 1217)   ceFEPime  (MAXIPIME ) IV Stopped (04/21/24 9178)   dexmedetomidine  (PRECEDEX ) IV infusion 0.4 mcg/kg/hr (04/21/24 1158)   feeding supplement (VITAL AF 1.2 CAL) Stopped (04/20/24 2008)   fentaNYL  infusion INTRAVENOUS Stopped (04/21/24 1047)   norepinephrine  (LEVOPHED ) Adult infusion 2 mcg/min (04/21/24 1158)   vasopressin  Stopped (04/21/24 0752)    PRN Meds: acetaminophen  **OR** acetaminophen , fentaNYL , fentaNYL  (SUBLIMAZE ) injection, midazolam  PF, morphine  injection, mouth rinse, oxyCODONE , promethazine , sodium chloride  flush  Physical Exam Vitals reviewed.  Constitutional:      General: She is not in acute distress.    Appearance: She is ill-appearing.     Interventions: She is intubated.  Cardiovascular:     Rate and Rhythm:  Normal rate.  Pulmonary:     Effort: She is intubated.             Vital Signs: BP (!) 136/53   Pulse 76   Temp 99.1 F (37.3 C)   Resp 14   Ht 5' 5 (1.651 m)   Wt 80 kg   LMP 01/11/2011   SpO2 99%   BMI 29.35 kg/m  SpO2: SpO2: 99 % O2 Device: O2 Device: Ventilator O2 Flow Rate: O2 Flow Rate (L/min): (S) 45 L/min  Palliative Assessment/Data: 10%      Patient Active Problem List   Diagnosis Date Noted   Malnutrition of moderate degree 04/18/2024   Acute respiratory failure with hypoxia (HCC) 04/17/2024   Anemia 04/13/2024   Antineoplastic chemotherapy induced anemia 04/12/2024   Gastroenteritis 04/11/2024   Bacteremia 03/24/2024   Nausea 03/24/2024   Cirrhosis of liver (HCC) 03/24/2024   Hepatitis 03/15/2024   Ascites 01/11/2024   Portal hypertension (HCC) 10/24/2023   Hypotension 10/24/2023   Protein-calorie malnutrition, severe 10/11/2023   Hypothyroidism 10/08/2023   Moderate protein malnutrition 10/08/2023   Pancytopenia (HCC) 10/08/2023   Symptomatic anemia 10/08/2023   Pain of left thumb 08/25/2023   Bilateral lower extremity edema 04/08/2023   Thrombocytopenia 04/03/2023   Hyponatremia 04/03/2023   Genetic testing 01/28/2023   Pancreatic cancer (HCC) 01/06/2023   Jaundice 12/16/2022   Allergic rhinitis 09/09/2022   Routine general medical examination at a health care facility 01/01/2021   Muscle cramps 07/10/2020   Current use of proton pump inhibitor 07/10/2020   B12 deficiency 07/10/2020   Prediabetes 12/26/2019   Gastroesophageal reflux disease 12/02/2017   Stress incontinence of urine 07/26/2016    Palliative Care Assessment & Plan   Patient Profile: 63 y.o. female  with past medical history of metastatic pancreatic adenocarcinoma to liver, pancytopenia, cirrhosis with ascites, GERD, presented with worsening of abdominal pain likely due to large ascites (LVP on 11/19, drained 4.2 L, improved abdominal pain) with hospital course c/b  SVT requiring hemodynamic monitoring in ICU.  She is admitted on 04/11/2024 with shock likely mixed cardiogenic due to tachyarrhythmia with sepsis in the setting of peritonitis, A-fib with RVR, SBP versus secondary peritonitis status post paracentesis suggestive of SBP, acute respiratory failure with hypoxia, AKI, severe thrombocytopenia, normocytic anemia, and others.    Palliative medicine was consulted for GOC conversations.    Today's Discussion: Reviewed chart and received updates from nursing and attending provider. Patient remains intubated. Her husband Kiki is at bedside. Kiki shared stories about patient and their family. Kiki remains hopeful the patient will have a meaningful recovery. He would like to see her extubated. He would also like her to be able to communicate. She has always managed their finances and he does not have the information on how to pay their bills.  Emotional support and therapeutic listening provided. Encouraged Calvin to reach out to PMT with needs. PMT will follow peripherally.  Recommendations/Plan: Full code Full scope of care Family committed to doing everything and fully understand the poor prognosis and likely outcomes Continued emotional support of patient and family Palliative medicine will monitor for changes and follow-up as needed    Code Status:    Code Status Orders  (From admission, onward)           Start     Ordered   04/11/24 1301  Full code  Continuous       Question:  By:  Answer:  Consent: discussion documented in EHR   04/11/24 1302         Extensive chart review has been completed prior to seeing the patient including labs, vital signs, imaging, progress/consult notes, orders, medications, and available advance directive documents.  Care plan was discussed with bedside RN and Dr. Jude  Time spent: 35 minutes  Thank you for allowing the Palliative Medicine Team to assist in the care of this patient.    Stephane CHRISTELLA Palin, NP  Please contact Palliative Medicine Team phone at [phone] for questions and concerns.

## 2024-04-21 NOTE — Progress Notes (Signed)
 PHARMACY - PHYSICIAN COMMUNICATION CRITICAL VALUE ALERT - BLOOD CULTURE IDENTIFICATION (BCID)  Nicole Johnson is an 63 y.o. female who presented to [facility] on 04/11/2024 with a chief complaint of abdominal pain  Assessment:  1 out 3 BC growing Yeast: Candida Glabrata  Name of physician (or Provider) Contacted: Jude  Current antibiotics: Micafungin started  Changes to prescribed antibiotics recommended:  Patient is on recommended antibiotics - No changes needed  Results for orders placed or performed during the hospital encounter of 04/11/24  Blood Culture ID Panel (Reflexed) (Collected: 04/17/2024  9:06 AM)  Result Value Ref Range   Enterococcus faecalis DETECTED (A) NOT DETECTED   Enterococcus Faecium NOT DETECTED NOT DETECTED   Listeria monocytogenes NOT DETECTED NOT DETECTED   Staphylococcus species NOT DETECTED NOT DETECTED   Staphylococcus aureus (BCID) NOT DETECTED NOT DETECTED   Staphylococcus epidermidis NOT DETECTED NOT DETECTED   Staphylococcus lugdunensis NOT DETECTED NOT DETECTED   Streptococcus species NOT DETECTED NOT DETECTED   Streptococcus agalactiae NOT DETECTED NOT DETECTED   Streptococcus pneumoniae NOT DETECTED NOT DETECTED   Streptococcus pyogenes NOT DETECTED NOT DETECTED   A.calcoaceticus-baumannii NOT DETECTED NOT DETECTED   Bacteroides fragilis NOT DETECTED NOT DETECTED   Enterobacterales DETECTED (A) NOT DETECTED   Enterobacter cloacae complex NOT DETECTED NOT DETECTED   Escherichia coli NOT DETECTED NOT DETECTED   Klebsiella aerogenes DETECTED (A) NOT DETECTED   Klebsiella oxytoca NOT DETECTED NOT DETECTED   Klebsiella pneumoniae NOT DETECTED NOT DETECTED   Proteus species NOT DETECTED NOT DETECTED   Salmonella species NOT DETECTED NOT DETECTED   Serratia marcescens NOT DETECTED NOT DETECTED   Haemophilus influenzae NOT DETECTED NOT DETECTED   Neisseria meningitidis NOT DETECTED NOT DETECTED   Pseudomonas aeruginosa NOT DETECTED NOT DETECTED    Stenotrophomonas maltophilia NOT DETECTED NOT DETECTED   Candida albicans NOT DETECTED NOT DETECTED   Candida auris NOT DETECTED NOT DETECTED   Candida glabrata NOT DETECTED NOT DETECTED   Candida krusei NOT DETECTED NOT DETECTED   Candida parapsilosis NOT DETECTED NOT DETECTED   Candida tropicalis NOT DETECTED NOT DETECTED   Cryptococcus neoformans/gattii NOT DETECTED NOT DETECTED   CTX-M ESBL NOT DETECTED NOT DETECTED   Carbapenem resistance IMP NOT DETECTED NOT DETECTED   Carbapenem resistance KPC NOT DETECTED NOT DETECTED   Carbapenem resistance NDM NOT DETECTED NOT DETECTED   Carbapenem resist OXA 48 LIKE NOT DETECTED NOT DETECTED   Vancomycin resistance NOT DETECTED NOT DETECTED   Carbapenem resistance VIM NOT DETECTED NOT DETECTED   Fortunata Betty Karoline Marina, PharmD, BCPS Clinical Staff Pharmacist  [facility] 04/21/2024  4:37 PM

## 2024-04-22 ENCOUNTER — Inpatient Hospital Stay (HOSPITAL_COMMUNITY)

## 2024-04-22 DIAGNOSIS — J9600 Acute respiratory failure, unspecified whether with hypoxia or hypercapnia: Secondary | ICD-10-CM | POA: Diagnosis not present

## 2024-04-22 DIAGNOSIS — R918 Other nonspecific abnormal finding of lung field: Secondary | ICD-10-CM | POA: Diagnosis not present

## 2024-04-22 DIAGNOSIS — R569 Unspecified convulsions: Secondary | ICD-10-CM | POA: Diagnosis not present

## 2024-04-22 DIAGNOSIS — Z4682 Encounter for fitting and adjustment of non-vascular catheter: Secondary | ICD-10-CM | POA: Diagnosis not present

## 2024-04-22 LAB — CBC WITH DIFFERENTIAL/PLATELET
Abs Immature Granulocytes: 0.03 K/uL (ref 0.00–0.07)
Basophils Absolute: 0 K/uL (ref 0.0–0.1)
Basophils Relative: 0 %
Eosinophils Absolute: 0 K/uL (ref 0.0–0.5)
Eosinophils Relative: 0 %
HCT: 20.7 % — ABNORMAL LOW (ref 36.0–46.0)
Hemoglobin: 7.1 g/dL — ABNORMAL LOW (ref 12.0–15.0)
Immature Granulocytes: 1 %
Lymphocytes Relative: 4 %
Lymphs Abs: 0.2 K/uL — ABNORMAL LOW (ref 0.7–4.0)
MCH: 28.7 pg (ref 26.0–34.0)
MCHC: 34.3 g/dL (ref 30.0–36.0)
MCV: 83.8 fL (ref 80.0–100.0)
Monocytes Absolute: 0.1 K/uL (ref 0.1–1.0)
Monocytes Relative: 3 %
Neutro Abs: 4 K/uL (ref 1.7–7.7)
Neutrophils Relative %: 92 %
Platelets: 18 K/uL — CL (ref 150–400)
RBC: 2.47 MIL/uL — ABNORMAL LOW (ref 3.87–5.11)
RDW: 18.8 % — ABNORMAL HIGH (ref 11.5–15.5)
WBC: 4.3 K/uL (ref 4.0–10.5)
nRBC: 0 % (ref 0.0–0.2)

## 2024-04-22 LAB — CBC
HCT: 20.6 % — ABNORMAL LOW (ref 36.0–46.0)
Hemoglobin: 6.9 g/dL — CL (ref 12.0–15.0)
MCH: 28 pg (ref 26.0–34.0)
MCHC: 33.5 g/dL (ref 30.0–36.0)
MCV: 83.7 fL (ref 80.0–100.0)
Platelets: 18 K/uL — CL (ref 150–400)
RBC: 2.46 MIL/uL — ABNORMAL LOW (ref 3.87–5.11)
RDW: 18.6 % — ABNORMAL HIGH (ref 11.5–15.5)
WBC: 4.5 K/uL (ref 4.0–10.5)
nRBC: 0 % (ref 0.0–0.2)

## 2024-04-22 LAB — GLUCOSE, CAPILLARY
Glucose-Capillary: 114 mg/dL — ABNORMAL HIGH (ref 70–99)
Glucose-Capillary: 117 mg/dL — ABNORMAL HIGH (ref 70–99)
Glucose-Capillary: 117 mg/dL — ABNORMAL HIGH (ref 70–99)
Glucose-Capillary: 125 mg/dL — ABNORMAL HIGH (ref 70–99)
Glucose-Capillary: 133 mg/dL — ABNORMAL HIGH (ref 70–99)
Glucose-Capillary: 140 mg/dL — ABNORMAL HIGH (ref 70–99)

## 2024-04-22 LAB — BASIC METABOLIC PANEL WITH GFR
Anion gap: 10 (ref 5–15)
Anion gap: 12 (ref 5–15)
BUN: 63 mg/dL — ABNORMAL HIGH (ref 8–23)
BUN: 65 mg/dL — ABNORMAL HIGH (ref 8–23)
CO2: 23 mmol/L (ref 22–32)
CO2: 22 mmol/L (ref 22–32)
Calcium: 7.7 mg/dL — ABNORMAL LOW (ref 8.9–10.3)
Calcium: 8 mg/dL — ABNORMAL LOW (ref 8.9–10.3)
Chloride: 102 mmol/L (ref 98–111)
Chloride: 103 mmol/L (ref 98–111)
Creatinine, Ser: 1.48 mg/dL — ABNORMAL HIGH (ref 0.44–1.00)
Creatinine, Ser: 1.55 mg/dL — ABNORMAL HIGH (ref 0.44–1.00)
GFR, Estimated: 40 mL/min — ABNORMAL LOW (ref >60)
GFR, Estimated: 37 mL/min — ABNORMAL LOW (ref >60)
Glucose, Bld: 129 mg/dL — ABNORMAL HIGH (ref 70–99)
Glucose, Bld: 123 mg/dL — ABNORMAL HIGH (ref 70–99)
Potassium: 3.1 mmol/L — ABNORMAL LOW (ref 3.5–5.1)
Potassium: 3.2 mmol/L — ABNORMAL LOW (ref 3.5–5.1)
Sodium: 135 mmol/L (ref 135–145)
Sodium: 137 mmol/L (ref 135–145)

## 2024-04-22 LAB — PREPARE RBC (CROSSMATCH)

## 2024-04-22 LAB — HEMOGLOBIN AND HEMATOCRIT, BLOOD
HCT: 27.6 % — ABNORMAL LOW (ref 36.0–46.0)
Hemoglobin: 9.7 g/dL — ABNORMAL LOW (ref 12.0–15.0)

## 2024-04-22 LAB — MAGNESIUM: Magnesium: 2.6 mg/dL — ABNORMAL HIGH (ref 1.7–2.4)

## 2024-04-22 LAB — PHOSPHORUS: Phosphorus: 3.8 mg/dL (ref 2.5–4.6)

## 2024-04-22 MED ORDER — POTASSIUM CHLORIDE 20 MEQ PO PACK
20.0000 meq | PACK | ORAL | Status: AC
  Administered 2024-04-22 (×2): 20 meq
  Filled 2024-04-22 (×2): qty 1

## 2024-04-22 MED ORDER — ONDANSETRON HCL 4 MG/2ML IJ SOLN
4.0000 mg | Freq: Four times a day (QID) | INTRAMUSCULAR | Status: DC | PRN
  Administered 2024-04-22: 4 mg via INTRAVENOUS
  Filled 2024-04-22: qty 2

## 2024-04-22 MED ORDER — MIDAZOLAM HCL (PF) 2 MG/2ML IJ SOLN
1.0000 mg | INTRAMUSCULAR | Status: DC | PRN
  Administered 2024-04-23 – 2024-04-25 (×8): 2 mg via INTRAVENOUS
  Filled 2024-04-22 (×8): qty 2

## 2024-04-22 MED ORDER — PROCHLORPERAZINE EDISYLATE 10 MG/2ML IJ SOLN
10.0000 mg | Freq: Four times a day (QID) | INTRAMUSCULAR | Status: DC | PRN
  Administered 2024-04-22: 10 mg via INTRAVENOUS
  Filled 2024-04-22: qty 2

## 2024-04-22 MED ORDER — LACTULOSE 10 GM/15ML PO SOLN
30.0000 g | Freq: Once | ORAL | Status: AC
  Administered 2024-04-22: 30 g
  Filled 2024-04-22: qty 45

## 2024-04-22 MED ORDER — SODIUM CHLORIDE 0.9% IV SOLUTION: Freq: Once | INTRAVENOUS | Status: AC

## 2024-04-22 MED ORDER — FUROSEMIDE 10 MG/ML IJ SOLN
40.0000 mg | Freq: Once | INTRAMUSCULAR | Status: AC
  Administered 2024-04-22: 40 mg via INTRAVENOUS
  Filled 2024-04-22: qty 4

## 2024-04-22 MED ORDER — IOHEXOL 9 MG/ML PO SOLN
500.0000 mL | ORAL | Status: AC
  Administered 2024-04-22: 60 mL via ORAL

## 2024-04-22 MED ORDER — POTASSIUM CHLORIDE 10 MEQ/50ML IV SOLN
10.0000 meq | INTRAVENOUS | Status: AC
  Administered 2024-04-22 (×4): 10 meq via INTRAVENOUS
  Filled 2024-04-22 (×4): qty 50

## 2024-04-22 NOTE — Progress Notes (Signed)
 RN administered 10mg  IV compazine  d/t patient vomiting around OGT. Gastric tube flushed with 60cc of air and continued on LIS@80  with moderate amount of secretions present. Pt had little to no secretions for about 10 minutes. RN began to administer 60cc of oral contrast. Shortly after contrast was given patient began dry heaving. Patient was placed back on LIS@80  with additional stomach contents suctioned through OGT. Elink made aware.

## 2024-04-22 NOTE — Progress Notes (Signed)
 [facility] ADULT ICU REPLACEMENT PROTOCOL   The patient does apply for the [facility] Adult ICU Electrolyte Replacment Protocol based on the criteria listed below:   1.Exclusion criteria: TCTS, ECMO, Dialysis, and Myasthenia Gravis patients 2. Is GFR >/= 30 ml/min? Yes.    Patient's GFR today is 40 3. Is SCr </= 2? Yes.   Patient's SCr is 1.48 mg/dL 4. Did SCr increase >/= 0.5 in 24 hours? No. 5.Pt's weight >40kg  Yes.   6. Abnormal electrolyte(s): K+ 3.1  7. Electrolytes replaced per protocol 8.  Call MD STAT for K+ </= 2.5, Phos </= 1, or Mag </= 1 Physician:  Dr Rober Darner, Norleen Barters 04/22/2024 6:31 AM

## 2024-04-22 NOTE — Plan of Care (Incomplete)
  Problem: Education: Goal: Knowledge of General Education information will improve Description: Including pain rating scale, medication(s)/side effects and non-pharmacologic comfort measures Outcome: Not Progressing   Problem: Health Behavior/Discharge Planning: Goal: Ability to manage health-related needs will improve Outcome: Not Progressing   Problem: Clinical Measurements: Goal: Ability to maintain clinical measurements within normal limits will improve Outcome: Not Progressing Goal: Will remain free from infection Outcome: Not Progressing Goal: Diagnostic test results will improve Outcome: Not Progressing Goal: Respiratory complications will improve Outcome: Not Progressing Goal: Cardiovascular complication will be avoided Outcome: Not Progressing   Problem: Activity: Goal: Risk for activity intolerance will decrease Outcome: Not Progressing   Problem: Nutrition: Goal: Adequate nutrition will be maintained Outcome: Not Progressing   Problem: Coping: Goal: Level of anxiety will decrease Outcome: Not Progressing   Problem: Elimination: Goal: Will not experience complications related to bowel motility Outcome: Not Progressing Goal: Will not experience complications related to urinary retention Outcome: Not Progressing   Problem: Pain Managment: Goal: General experience of comfort will improve and/or be controlled Outcome: Not Progressing   Problem: Safety: Goal: Ability to remain free from injury will improve Outcome: Not Progressing   Problem: Skin Integrity: Goal: Risk for impaired skin integrity will decrease Outcome: Not Progressing   Problem: Education: Goal: Ability to describe self-care measures that may prevent or decrease complications (Diabetes Survival Skills Education) will improve Outcome: Not Progressing Goal: Individualized Educational Video(s) Outcome: Not Progressing   Problem: Coping: Goal: Ability to adjust to condition or change in  health will improve Outcome: Not Progressing   Problem: Fluid Volume: Goal: Ability to maintain a balanced intake and output will improve Outcome: Not Progressing   Problem: Health Behavior/Discharge Planning: Goal: Ability to identify and utilize available resources and services will improve Outcome: Not Progressing Goal: Ability to manage health-related needs will improve Outcome: Not Progressing   Problem: Metabolic: Goal: Ability to maintain appropriate glucose levels will improve Outcome: Not Progressing   Problem: Nutritional: Goal: Maintenance of adequate nutrition will improve Outcome: Not Progressing Goal: Progress toward achieving an optimal weight will improve Outcome: Not Progressing   Problem: Skin Integrity: Goal: Risk for impaired skin integrity will decrease Outcome: Not Progressing   Problem: Tissue Perfusion: Goal: Adequacy of tissue perfusion will improve Outcome: Not Progressing   Problem: Activity: Goal: Ability to tolerate increased activity will improve Outcome: Not Progressing   Problem: Respiratory: Goal: Ability to maintain a clear airway and adequate ventilation will improve Outcome: Not Progressing   Problem: Role Relationship: Goal: Method of communication will improve Outcome: Not Progressing

## 2024-04-22 NOTE — Progress Notes (Addendum)
 Called to bedside for twitching of head & extremities. Poorly responsive on my arrival, roving eye movements, grimace to DPS Versed  1 mg given, spoke to Neurology - connect Rapid EEG & await read>> no seizure Consider head CT Family updated  Nicole Johnson V. Jude MD

## 2024-04-22 NOTE — Progress Notes (Signed)
 eLink Physician-Brief Progress Note Patient Name: Nicole Johnson DOB: 1960-06-04 MRN: [id number]   Date of Service  04/22/2024  HPI/Events of Note  Has CT a/p ordered but vomiting. Got Zofran  2 hours ago and has oral compazine  ordered. Will write for IV compazine    eICU Interventions  As above      Intervention Category Minor Interventions: Routine modifications to care plan (e.g. PRN medications for pain, fever)  Savanna Dooley G Jo Booze 04/22/2024, 8:43 PM

## 2024-04-22 NOTE — Procedures (Signed)
 Patient Name: Nicole Johnson  MRN: [id number]  Epilepsy Attending: Pastor Falling  Referring Physician/Provider: No ref. provider found      Date: 04/22/2024 Start time: 04/22/2024 at 1150 AM End Time: 04/22/2024 at 0210 PM Duration: 2 hours and 19 minutes   Patient history: 63 year old woman with metastatic pancreatic adenocarcinoma presenting with worsening abdominal pain and worsening mental status with seizure like activity during round. Ceribell to evaluate for seizure.   Description:  This EEG was obtained using a 10 lead EEG system positioned circumferentially without any parasagittal coverage (rapid EEG). Computer selected EEG is reviewed as  well as background features and all clinically significant events.   ABNORMALITY: Diffuse slowing    IMPRESSION: This study is suggestive of a generalized brain dysfunction, such as encephalopathy, non specific etiology, likely related to medication side effect or toxic metabolic. No seizures seen during this recording.    [facility] Castin Donaghue,MD  Neurology

## 2024-04-22 NOTE — Progress Notes (Signed)
 OGT 68@lip  to LIS @ 80. Upon arrival the patient room ventilator continued to alarm. It appeared that patient was vomiting around gastric tube. RN suctioned yellow/orange stomach-like content from patient's mouth. No additional secretions from ETT in-line suction. Elink notified RN and Pt's husband at bedside.

## 2024-04-22 NOTE — Progress Notes (Signed)
 `  NAME:  Nicole Johnson, MRN:  [id number], DOB:  01-27-1961, LOS: 10 ADMISSION DATE:  04/11/2024, CONSULTATION DATE:  04/22/24 REFERRING MD:  Dr. Earley, CHIEF COMPLAINT:  A. Fib with RVR.   History of Present Illness:  63 y.o. female with medical history significant of metastatic pancreatic adenocarcinoma to liver, pancytopenia, cirrhosis with ascites, GERD, presented with worsening of abdominal pain likely due to large ascites (LVP on 11/19, drained 4.2 L, improved abdominal pain)  with hospital course c/b SVT requiring hemodynamic monitoring in ICU.  HPI: Patient had her routine chemotherapy last Wednesday and she has been feeling fine afterwards.  Friday, patient and her husband ate a roasted chicken for lunch and both became sick in the evening, their symptoms including abdominal pain nauseous vomiting, they attributed to possible food toxin from the chicken.  Husband however recovered the next day and the patient on the other hand continue to feel sick, she had 1 day of diarrhea and continued to have nauseous and vomiting and decreased appetite for last 3 to 4 days, she could only tolerate some Ensure, and became very dehydrated.  She denied any hematemesis or hematochezia.  Her abdominal pain mainly located on lower abdomen bilateral, constant since yesterday.  She denied any fever or chills.  She told me that she is scheduled to have platelet transfusion at [facility] today.   ED Course: Afebrile, borderline tachycardia blood pressure 92/60, O2 saturation 100% on room air.  CT abdomen pelvis showed no acute findings, moderate ascites.  Blood work showed INR 1.6, sodium 128 BUN 39 creatinine 1.0 glucose 132 albumin  2.3, WBC 9.8 hemoglobin 8.0 compared to baseline 8.2-9.6, platelet 25.  Pertinent  Medical History   Past Medical History:  Diagnosis Date   Arthritis    Cancer (HCC)    pancreatic   GERD (gastroesophageal reflux disease)    PONV (postoperative nausea and vomiting)     Prediabetes    Significant Hospital Events: Including procedures, antibiotic start and stop dates in addition to other pertinent events   11/19 --> admitted, large volume paracentesis 4.2 L drained 11/22 --> SVT, given adenosine  6 mg and 12 mg, did not abort, came down to ICU for DCCV. Given amio bolus and infusion, came out of a. Fib into NSR. 11/23 --> overnight developed another episode of a. Fib with RVR, given another bolus of amio, back to NSR. Hgb down to 6.8, 1 unit of PRBCs ordered. 11/24 rebolus Amio for RVR , albumin  given 11/25 2.5 L paracentesis after Cherina transfusion, worsening shock 11/25 ETT >> 11/26 blood cultures showing Klebsiella and Enterococcus >> abx to cefepime  plus ampicillin  11/27 Amio to oral 11/28 platelet transfusion, Port-A-Cath DC'd, left IJ placed  Interim History / Subjective:  Improving critically ill, remains intubated, Off pressors Good response to Lasix , 1.8 L urine but remains 2 L positive   Lines/Tubes: LIJ,  biliary stent  Objective    Blood pressure 117/62, pulse (!) 55, temperature 99 F (37.2 C), resp. rate 20, height 5' 5 (1.651 m), weight 80 kg, last menstrual period 01/11/2011, SpO2 100%.    Vent Mode: PRVC FiO2 (%):  [40 %-50 %] 40 % Set Rate:  [24 bmp] 24 bmp Vt Set:  [450 mL] 450 mL PEEP:  [5 cmH20] 5 cmH20 Pressure Support:  [5 cmH20] 5 cmH20 Plateau Pressure:  [10 cmH20-23 cmH20] 20 cmH20   Intake/Output Summary (Last 24 hours) at 04/22/2024 9061 Last data filed at 04/22/2024 0911 Gross per 24 hour  Intake 780.76 ml  Output 1725 ml  Net -944.24 ml   Filed Weights   04/20/24 0500 04/21/24 0706 04/22/24 0500  Weight: 83 kg 80 kg 80 kg    Examination: General: cachectic, chronically ill appearing, face turned to left Eyes: PERRL, no scleral icterus ENMT: Oral ETT , no JVD Skin: warm, intact, no rashes Neck: Elevated JVD, no lymphadenopathy CV: RRR, no MRG, nl S1 and S2, 2+ pitting peripheral edema Resp:  Bilateral decreased ventilated breath sounds, no accessory muscle use Abdom: Nontender, distended, fluid thrill present Neuro: Eyes open but does not follow commands, RASS 0 to -1  Labs show improving hyponatremia 135, stable renal function 63/1.5, hemoglobin dropping to 6.9 platelets 18K, hypokalemia  Chest x-ray 11/30 shows improving bilateral interstitial infiltrates and effusions  Resolved problem list   Assessment and Plan   # Septic shock: Due to presumed peritonitis.  Polymicrobial bacteremia - Klebsiella, E. gallinarum and E.  Faecalis, YEAST #SBP vs. Secondary peritonitis: paracentesis yielded 1610 leukocytes, PMN 84%   Prior history of percutaneous cholecystostomy 06/2023 for perforated cholecystitis and Klebsiella bacteremia 02/2024 .  With polymicrobial bacteremia, there would be concern for GI fistula, CT abdomen was negative on 11/19.  Port-A-Cath DC'd 11/28  - Changed antibiotics to cefepime  and ampicillin , ID following -Added micafungin 11/29 , unclear source, may have to consider repeat CT abdomen -Off pressors - Continue midodrine  10 mg TID -  dc stress dose steroids    #Atrial Fibrillation with RVR: CHADS2VASC is 1.  Echo shows normal LVEF, mildly elevated RVSP - 11/27 Changed amiodarone  to 200 twice daily -keep at this dose a since  still having some breakthrough events - Not a candidate for anticoagulation due to low chads2vasc and severe thrombocytopenia   Acute respiratory failure with hypoxia -intubated 11/25 due to increasing oxygen requirements not relieved by paracentesis - Low tidal volume ventilation - SAT/SBT daily, getting closer to extubation - PAD protocol, using Precedex  to minimize fentanyl  use, goal RASS 0  #Stress Ulcer PPx: continue PPI   #AKI: Likely ATN due to septic shock, Received albumin  prior to paracentesis for volume expansion - Avoid Nephrotoxins - Strict I/Os - Good response to Lasix  40 IV, repeat and consider drip - Hypokalemia  to be repleted  Malignant ascites status post large-volume paracentesis on 11/6, 11/20 & 11/25 Ileus due to high-dose fentanyl  --Continue bowel regimen, repeat lactulose 30 x 1 today , if no response will need enema  #Severe Thrombocytopenia status post platelet transfusion 11/25, 11/28 #Normocytic Anemia > Pancytopenia likely due to bone marrow suppression from malignancy/chemoRx. - Transfuse PLT for goal > 10 or 50K for procedures - Transfuse Hgb for goal > 7 mg/dL >> 1 unit PRBC today  Disposition: ICU appropriate for vasopressors and mechanical ventilation Palliative care assisting. She is terminally ill from advanced malignancy.  She has refractory malignant ascites and septic shock with polymicrobial bacteremia/fungemia. Patient and family desired advanced life support measures.  Husband has expressed frustration over repeated discussion of CODE STATUS, poor prognosis has been conveyed to them.  She does seem to be recovering somewhat and may survive acute episode Husband updated at bedside daily   Labs   CBC: Recent Labs  Lab 04/18/24 0342 04/19/24 0434 04/20/24 0613 04/20/24 2159 04/21/24 0538 04/22/24 0511 04/22/24 0740  WBC 7.0 9.8 7.9  --  5.6 4.3 4.5  NEUTROABS 6.5 9.1* 7.4  --  5.2 4.0  --   HGB 10.4* 9.4* 8.2*  --  7.4* 7.1* 6.9*  HCT 32.6* 28.9* 24.5*  --  22.5* 20.7* 20.6*  MCV 87.2 85.8 82.5  --  84.6 83.8 83.7  PLT 35* 47* 29* 27* 26* 18* 18*    Basic Metabolic Panel: Recent Labs  Lab 04/18/24 0342 04/19/24 0434 04/20/24 0613 04/21/24 0538 04/22/24 0511  NA 132* 129* 130* 133* 135  K 4.0 4.3 4.1 3.9 3.1*  CL 99 97* 98 101 102  CO2 20* 22 23 24 23   GLUCOSE 145* 205* 171* 151* 129*  BUN 36* 44* 56* 59* 63*  CREATININE 1.37* 1.42* 1.52* 1.44* 1.48*  CALCIUM  7.9* 8.1* 7.7* 7.8* 7.7*  MG 2.4 2.4 2.4 2.5* 2.6*  PHOS 4.6 5.0* 4.5 4.3 3.8   GFR: Estimated Creatinine Clearance: 41.2 mL/min (A) (by C-G formula based on SCr of 1.48 mg/dL (H)). Recent  Labs  Lab 04/20/24 0613 04/21/24 0538 04/22/24 0511 04/22/24 0740  WBC 7.9 5.6 4.3 4.5    Liver Function Tests: No results for input(s): AST, ALT, ALKPHOS, BILITOT, PROT, ALBUMIN  in the last 168 hours.  No results for input(s): LIPASE, AMYLASE in the last 168 hours.  No results for input(s): AMMONIA in the last 168 hours.   ABG    Component Value Date/Time   PHART 7.25 (L) 04/17/2024 1710   PCO2ART 45 04/17/2024 1710   PO2ART 115 (H) 04/17/2024 1710   HCO3 20.1 04/17/2024 1710   TCO2 23 10/08/2023 1227   ACIDBASEDEF 7.5 (H) 04/17/2024 1710   O2SAT 99.6 04/17/2024 1710     Coagulation Profile: No results for input(s): INR, PROTIME in the last 168 hours.   Cardiac Enzymes: No results for input(s): CKTOTAL, CKMB, CKMBINDEX, TROPONINI in the last 168 hours.  HbA1C: Hgb A1c MFr Bld  Date/Time Value Ref Range Status  04/15/2024 04:59 AM 6.1 (H) 4.8 - 5.6 % Final    Comment:    (NOTE) Diagnosis of Diabetes The following HbA1c ranges recommended by the American Diabetes Association (ADA) may be used as an aid in the diagnosis of diabetes mellitus.  Hemoglobin             Suggested A1C NGSP%              Diagnosis  <5.7                   Non Diabetic  5.7-6.4                Pre-Diabetic  >6.4                   Diabetic  <7.0                   Glycemic control for                       adults with diabetes.    03/14/2023 09:09 AM 6.2 4.6 - 6.5 % Final    Comment:    Glycemic Control Guidelines for People with Diabetes:Non Diabetic:  <6%Goal of Therapy: <7%Additional Action Suggested:  >8%     CBG: Recent Labs  Lab 04/21/24 1657 04/21/24 1959 04/22/24 0006 04/22/24 0404 04/22/24 0755  GLUCAP 132* 109* 133* 140* 117*     My independent critical care time was 38 minutes  Harden Staff MD. FCCP. [facility] Pager : [phone]  If no response to pager , please call [phone] until 7 pm After 7:00 pm  call Elink  [phone]   04/22/2024

## 2024-04-22 NOTE — Progress Notes (Signed)
 eLink Physician-Brief Progress Note Patient Name: Nicole Johnson DOB: August 12, 1960 MRN: [id number]   Date of Service  04/22/2024  HPI/Events of Note  Thrombocytopenia and anemia slightly getting worse from 29 th.    eICU Interventions  Follow CBC again in 2 hrs. Transfuse if Hg < 7.      Intervention Category Intermediate Interventions: Other:  Nicole Johnson 04/22/2024, 6:51 AM

## 2024-04-22 NOTE — Progress Notes (Signed)
 Daily Progress Note   Patient Name: Nicole Johnson       Date: 04/22/2024 DOB: 27-Sep-1960  Age: 63 y.o. MRN#: [id number] Attending Physician: Nicole Harden GAILS, MD Primary Care Physician: Tower, Laine DELENA, MD Admit Date: 04/11/2024  Reason for Consultation/Follow-up: Establishing goals of care   Length of Stay: 10  Current Medications: Scheduled Meds:   sodium chloride    Intravenous Once   amiodarone   200 mg Per Tube BID   Chlorhexidine  Gluconate Cloth  6 each Topical Q0600   docusate  100 mg Per Tube BID   insulin  aspart  0-9 Units Subcutaneous Q4H   lactulose  30 g Per Tube Once   midodrine   10 mg Per Tube TID WC   mouth rinse  15 mL Mouth Rinse Q2H   pantoprazole  (PROTONIX ) IV  40 mg Intravenous Q12H   polyethylene glycol  17 g Per Tube BID   potassium chloride   20 mEq Per Tube Q4H   sodium chloride  flush  10-40 mL Intracatheter Q12H   thiamine  (VITAMIN B1) injection  100 mg Intravenous Daily    Continuous Infusions:  ampicillin  (OMNIPEN) IV Stopped (04/22/24 0528)   ceFEPime  (MAXIPIME ) IV Stopped (04/22/24 0908)   dexmedetomidine  (PRECEDEX ) IV infusion 0.5 mcg/kg/hr (04/22/24 0710)   feeding supplement (VITAL AF 1.2 CAL) Stopped (04/20/24 2008)   micafungin (MYCAMINE) 100 mg in sodium chloride  0.9 % 100 mL IVPB 100 mg (04/21/24 1805)   norepinephrine  (LEVOPHED ) Adult infusion Stopped (04/21/24 2025)   potassium chloride  10 mEq (04/22/24 0933)    PRN Meds: acetaminophen  **OR** acetaminophen , fentaNYL , midazolam  PF, morphine  injection, mouth rinse, oxyCODONE , promethazine , sodium chloride  flush  Physical Exam Vitals reviewed.  Constitutional:      General: She is not in acute distress.    Appearance: She is ill-appearing.     Interventions: She is intubated.   Cardiovascular:     Rate and Rhythm: Normal rate.  Pulmonary:     Effort: She is intubated.             Vital Signs: BP (!) 112/55   Pulse 65   Temp 98.8 F (37.1 C)   Resp 20   Ht 5' 5 (1.651 m)   Wt 80 kg   LMP 01/11/2011   SpO2 100%   BMI 29.35 kg/m  SpO2: SpO2: 100 %  O2 Device: O2 Device: Ventilator O2 Flow Rate: O2 Flow Rate (L/min): (S) 45 L/min        Palliative Assessment/Data: 10%      Patient Active Problem List   Diagnosis Date Noted   Malnutrition of moderate degree 04/18/2024   Acute respiratory failure with hypoxia (HCC) 04/17/2024   Anemia 04/13/2024   Antineoplastic chemotherapy induced anemia 04/12/2024   Gastroenteritis 04/11/2024   Bacteremia 03/24/2024   Nausea 03/24/2024   Cirrhosis of liver (HCC) 03/24/2024   Hepatitis 03/15/2024   Ascites 01/11/2024   Portal hypertension (HCC) 10/24/2023   Hypotension 10/24/2023   Protein-calorie malnutrition, severe 10/11/2023   Hypothyroidism 10/08/2023   Moderate protein malnutrition 10/08/2023   Pancytopenia (HCC) 10/08/2023   Symptomatic anemia 10/08/2023   Pain of left thumb 08/25/2023   Bilateral lower extremity edema 04/08/2023   Thrombocytopenia 04/03/2023   Hyponatremia 04/03/2023   Genetic testing 01/28/2023   Pancreatic cancer (HCC) 01/06/2023   Jaundice 12/16/2022   Allergic rhinitis 09/09/2022   Routine general medical examination at a health care facility 01/01/2021   Muscle cramps 07/10/2020   Current use of proton pump inhibitor 07/10/2020   B12 deficiency 07/10/2020   Prediabetes 12/26/2019   Gastroesophageal reflux disease 12/02/2017   Stress incontinence of urine 07/26/2016    Palliative Care Assessment & Plan   Patient Profile: 63 y.o. female  with past medical history of metastatic pancreatic adenocarcinoma to liver, pancytopenia, cirrhosis with ascites, GERD, presented with worsening of abdominal pain likely due to large ascites (LVP on 11/19, drained 4.2 L, improved  abdominal pain) with hospital course c/b SVT requiring hemodynamic monitoring in ICU.  She is admitted on 04/11/2024 with shock likely mixed cardiogenic due to tachyarrhythmia with sepsis in the setting of peritonitis, A-fib with RVR, SBP versus secondary peritonitis status post paracentesis suggestive of SBP, acute respiratory failure with hypoxia, AKI, severe thrombocytopenia, normocytic anemia, and others.    Palliative medicine was consulted for GOC conversations.    Today's Discussion: Reviewed chart and received updates from nursing and attending provider. Patient remains intubated. Her husband Nicole Johnson and sister-in-law are at bedside. Patient's husband remains hopeful she will have a meaningful recovery. He is still very concerned about their household bills (which patient manages). Discussed importance of completing ACP documents when the patient is able.   Emotional support and therapeutic listening provided. Encouraged Nicole Johnson to reach out to PMT with needs. PMT will follow peripherally.  Recommendations/Plan: Full code Full scope of care Family hopeful Continued emotional support of patient and family Palliative medicine will monitor for changes and follow-up as needed    Code Status:    Code Status Orders  (From admission, onward)           Start     Ordered   04/11/24 1301  Full code  Continuous       Question:  By:  Answer:  Consent: discussion documented in EHR   04/11/24 1302         Extensive chart review has been completed prior to seeing the patient including labs, vital signs, imaging, progress/consult notes, orders, medications, and available advance directive documents.  Care plan was discussed with bedside RN and Dr. Jude  Time spent: 25 minutes  Thank you for allowing the Palliative Medicine Team to assist in the care of this patient.    Nicole CHRISTELLA Palin, NP  Please contact Palliative Medicine Team phone at [phone] for questions and concerns.

## 2024-04-23 DIAGNOSIS — A499 Bacterial infection, unspecified: Secondary | ICD-10-CM | POA: Diagnosis present

## 2024-04-23 DIAGNOSIS — J9601 Acute respiratory failure with hypoxia: Secondary | ICD-10-CM | POA: Diagnosis not present

## 2024-04-23 DIAGNOSIS — B379 Candidiasis, unspecified: Secondary | ICD-10-CM | POA: Diagnosis present

## 2024-04-23 DIAGNOSIS — C251 Malignant neoplasm of body of pancreas: Secondary | ICD-10-CM

## 2024-04-23 DIAGNOSIS — B49 Unspecified mycosis: Secondary | ICD-10-CM | POA: Diagnosis present

## 2024-04-23 DIAGNOSIS — C787 Secondary malignant neoplasm of liver and intrahepatic bile duct: Secondary | ICD-10-CM | POA: Diagnosis not present

## 2024-04-23 DIAGNOSIS — C801 Malignant (primary) neoplasm, unspecified: Secondary | ICD-10-CM | POA: Diagnosis not present

## 2024-04-23 DIAGNOSIS — R188 Other ascites: Secondary | ICD-10-CM | POA: Diagnosis not present

## 2024-04-23 DIAGNOSIS — K8689 Other specified diseases of pancreas: Secondary | ICD-10-CM | POA: Diagnosis not present

## 2024-04-23 LAB — CBC WITH DIFFERENTIAL/PLATELET
Abs Immature Granulocytes: 0.05 K/uL (ref 0.00–0.07)
Basophils Absolute: 0 K/uL (ref 0.0–0.1)
Basophils Relative: 0 %
Eosinophils Absolute: 0 K/uL (ref 0.0–0.5)
Eosinophils Relative: 0 %
HCT: 27.5 % — ABNORMAL LOW (ref 36.0–46.0)
Hemoglobin: 9.5 g/dL — ABNORMAL LOW (ref 12.0–15.0)
Immature Granulocytes: 1 %
Lymphocytes Relative: 3 %
Lymphs Abs: 0.2 K/uL — ABNORMAL LOW (ref 0.7–4.0)
MCH: 28.8 pg (ref 26.0–34.0)
MCHC: 34.5 g/dL (ref 30.0–36.0)
MCV: 83.3 fL (ref 80.0–100.0)
Monocytes Absolute: 0.2 K/uL (ref 0.1–1.0)
Monocytes Relative: 3 %
Neutro Abs: 7.7 K/uL (ref 1.7–7.7)
Neutrophils Relative %: 93 %
Platelets: 22 K/uL — CL (ref 150–400)
RBC: 3.3 MIL/uL — ABNORMAL LOW (ref 3.87–5.11)
RDW: 18 % — ABNORMAL HIGH (ref 11.5–15.5)
WBC: 8.3 K/uL (ref 4.0–10.5)
nRBC: 0 % (ref 0.0–0.2)

## 2024-04-23 LAB — GLUCOSE, CAPILLARY
Glucose-Capillary: 109 mg/dL — ABNORMAL HIGH (ref 70–99)
Glucose-Capillary: 109 mg/dL — ABNORMAL HIGH (ref 70–99)
Glucose-Capillary: 110 mg/dL — ABNORMAL HIGH (ref 70–99)
Glucose-Capillary: 114 mg/dL — ABNORMAL HIGH (ref 70–99)
Glucose-Capillary: 114 mg/dL — ABNORMAL HIGH (ref 70–99)
Glucose-Capillary: 116 mg/dL — ABNORMAL HIGH (ref 70–99)
Glucose-Capillary: 125 mg/dL — ABNORMAL HIGH (ref 70–99)

## 2024-04-23 LAB — CULTURE, BLOOD (ROUTINE X 2)
Culture: NO GROWTH
Special Requests: ADEQUATE
Special Requests: ADEQUATE

## 2024-04-23 LAB — TYPE AND SCREEN
ABO/RH(D): O POS
Antibody Screen: NEGATIVE
Unit division: 0

## 2024-04-23 LAB — PREPARE PLATELET PHERESIS
Unit division: 0
Unit division: 0

## 2024-04-23 LAB — BPAM PLATELET PHERESIS
Blood Product Expiration Date: 202511282359
Blood Product Expiration Date: 202511302359
ISSUE DATE / TIME: 202511280931
ISSUE DATE / TIME: 202511281555
Unit Type and Rh: 5100
Unit Type and Rh: 5100

## 2024-04-23 LAB — BPAM RBC
Blood Product Expiration Date: 202512282359
ISSUE DATE / TIME: 202511301025
Unit Type and Rh: 5100

## 2024-04-23 LAB — BASIC METABOLIC PANEL WITH GFR
Anion gap: 14 (ref 5–15)
BUN: 68 mg/dL — ABNORMAL HIGH (ref 8–23)
CO2: 21 mmol/L — ABNORMAL LOW (ref 22–32)
Calcium: 7.9 mg/dL — ABNORMAL LOW (ref 8.9–10.3)
Chloride: 105 mmol/L (ref 98–111)
Creatinine, Ser: 1.7 mg/dL — ABNORMAL HIGH (ref 0.44–1.00)
GFR, Estimated: 34 mL/min — ABNORMAL LOW (ref >60)
Glucose, Bld: 120 mg/dL — ABNORMAL HIGH (ref 70–99)
Potassium: 3.2 mmol/L — ABNORMAL LOW (ref 3.5–5.1)
Sodium: 139 mmol/L (ref 135–145)

## 2024-04-23 LAB — PHOSPHORUS: Phosphorus: 4 mg/dL (ref 2.5–4.6)

## 2024-04-23 LAB — CATH TIP CULTURE: Culture: NO GROWTH

## 2024-04-23 LAB — MAGNESIUM: Magnesium: 2.6 mg/dL — ABNORMAL HIGH (ref 1.7–2.4)

## 2024-04-23 MED ORDER — CALCIUM GLUCONATE-NACL 1-0.675 GM/50ML-% IV SOLN
1.0000 g | Freq: Once | INTRAVENOUS | Status: AC
  Administered 2024-04-23: 1000 mg via INTRAVENOUS
  Filled 2024-04-23: qty 50

## 2024-04-23 MED ORDER — POTASSIUM CHLORIDE 10 MEQ/100ML IV SOLN
10.0000 meq | INTRAVENOUS | Status: AC
  Administered 2024-04-23 (×3): 10 meq via INTRAVENOUS
  Filled 2024-04-23 (×2): qty 100

## 2024-04-23 MED ORDER — FENTANYL CITRATE (PF) 100 MCG/2ML IJ SOLN
100.0000 ug | Freq: Once | INTRAMUSCULAR | Status: AC
  Administered 2024-04-23: 100 ug via INTRAVENOUS

## 2024-04-23 MED ORDER — FENTANYL CITRATE (PF) 100 MCG/2ML IJ SOLN
INTRAMUSCULAR | Status: AC
  Filled 2024-04-23: qty 2

## 2024-04-23 NOTE — Progress Notes (Signed)
 Subjective:  Intubated on ventilator  Antibiotics:  Anti-infectives (From admission, onward)    Start     Dose/Rate Route Frequency Ordered Stop   04/21/24 1800  micafungin (MYCAMINE) 100 mg in sodium chloride  0.9 % 100 mL IVPB        100 mg 105 mL/hr over 1 Hours Intravenous Every 24 hours 04/21/24 1601     04/18/24 1000  ampicillin  (OMNIPEN) 2 g in sodium chloride  0.9 % 100 mL IVPB        2 g 300 mL/hr over 20 Minutes Intravenous Every 6 hours 04/18/24 0804     04/18/24 0900  ceFEPIme  (MAXIPIME ) 2 g in sodium chloride  0.9 % 100 mL IVPB        2 g 200 mL/hr over 30 Minutes Intravenous Every 12 hours 04/18/24 0804     04/14/24 1330  cefTRIAXone  (ROCEPHIN ) 2 g in sodium chloride  0.9 % 100 mL IVPB  Status:  Discontinued        2 g 200 mL/hr over 30 Minutes Intravenous Every 24 hours 04/14/24 1237 04/18/24 0804       Medications: Scheduled Meds:  Chlorhexidine  Gluconate Cloth  6 each Topical Q0600   docusate  100 mg Per Tube BID   insulin  aspart  0-9 Units Subcutaneous Q4H   midodrine   10 mg Per Tube TID WC   mouth rinse  15 mL Mouth Rinse Q2H   pantoprazole  (PROTONIX ) IV  40 mg Intravenous Q12H   polyethylene glycol  17 g Per Tube BID   sodium chloride  flush  10-40 mL Intracatheter Q12H   thiamine  (VITAMIN B1) injection  100 mg Intravenous Daily   Continuous Infusions:  ampicillin  (OMNIPEN) IV Stopped (04/23/24 0559)   calcium gluconate 1,000 mg (04/23/24 1058)   ceFEPime  (MAXIPIME ) IV 2 g (04/23/24 0830)   dexmedetomidine  (PRECEDEX ) IV infusion Stopped (04/22/24 0816)   micafungin (MYCAMINE) 100 mg in sodium chloride  0.9 % 100 mL IVPB Stopped (04/22/24 1940)   norepinephrine  (LEVOPHED ) Adult infusion Stopped (04/22/24 1539)   potassium chloride  10 mEq (04/23/24 1038)   PRN Meds:.acetaminophen  **OR** acetaminophen , fentaNYL , midazolam  PF, morphine  injection, ondansetron  (ZOFRAN ) IV, mouth rinse, oxyCODONE , prochlorperazine , sodium chloride   flush    Objective: Weight change: -0.167 kg  Intake/Output Summary (Last 24 hours) at 04/23/2024 1116 Last data filed at 04/23/2024 0602 Gross per 24 hour  Intake 1319.17 ml  Output 2200 ml  Net -880.83 ml   Blood pressure 136/66, pulse 80, temperature 98.2 F (36.8 C), temperature source Oral, resp. rate (!) 24, height 5' 5 (1.651 m), weight 79.8 kg, last menstrual period 01/11/2011, SpO2 99%. Temp:  [79.5 F (26.4 C)-99.1 F (37.3 C)] 98.2 F (36.8 C) (12/01 0800) Pulse Rate:  [56-101] 80 (12/01 0400) Resp:  [18-26] 24 (12/01 0400) BP: (93-159)/(37-77) 136/66 (12/01 0400) SpO2:  [99 %-100 %] 99 % (12/01 1046) FiO2 (%):  [30 %-40 %] 30 % (12/01 1046) Weight:  [79.8 kg] 79.8 kg (12/01 0500)  Physical Exam: Physical Exam Constitutional:      Interventions: She is intubated.  HENT:     Head: Normocephalic and atraumatic.  Cardiovascular:     Rate and Rhythm: Tachycardia present.  Pulmonary:     Effort: She is intubated.  Abdominal:     General: There is distension.  Musculoskeletal:        General: Normal range of motion.  Skin:    Coloration: Skin is pale.  Neurological:     General: No  focal deficit present.      CBC:    BMET Recent Labs    04/22/24 1519 04/23/24 0511  NA 137 139  K 3.2* 3.2*  CL 103 105  CO2 22 21*  GLUCOSE 123* 120*  BUN 65* 68*  CREATININE 1.55* 1.70*  CALCIUM 8.0* 7.9*     Liver Panel  No results for input(s): PROT, ALBUMIN , AST, ALT, ALKPHOS, BILITOT, BILIDIR, IBILI in the last 72 hours.     Sedimentation Rate No results for input(s): ESRSEDRATE in the last 72 hours. C-Reactive Protein No results for input(s): CRP in the last 72 hours.  Micro Results: Recent Results (from the past 720 hours)  Gram stain     Status: None   Collection Time: 04/11/24  4:37 PM   Specimen: PATH Cytology Peritoneal fluid  Result Value Ref Range Status   Specimen Description PERITONEAL  Final   Special Requests  NONE  Final   Gram Stain   Final    NO ORGANISMS SEEN RARE PMNS Performed at [facility], [address], [place], [place] [address]    Report Status 04/11/2024 FINAL  Final  Aerobic/Anaerobic Culture w Gram Stain (surgical/deep wound)     Status: None   Collection Time: 04/11/24  4:37 PM   Specimen: PATH Cytology Peritoneal fluid  Result Value Ref Range Status   Specimen Description   Final    PERITONEAL Performed at [facility], [address], [place], [place] [address]    Special Requests   Final    NONE Performed at [facility], [address] [place], [place] [address]    Gram Stain RARE WBC SEEN NO ORGANISMS SEEN   Final   Culture   Final    No growth aerobically or anaerobically. Performed at [facility], [address] [place], [place] [address]    Report Status 04/17/2024 FINAL  Final  Gastrointestinal Panel by PCR , Stool     Status: None   Collection Time: 04/14/24 10:57 PM   Specimen: Stool  Result Value Ref Range Status   Campylobacter species NOT DETECTED NOT DETECTED Final   Plesimonas shigelloides NOT DETECTED NOT DETECTED Final   Salmonella species NOT DETECTED NOT DETECTED Final   Yersinia enterocolitica NOT DETECTED NOT DETECTED Final   Vibrio species NOT DETECTED NOT DETECTED Final   Vibrio cholerae NOT DETECTED NOT DETECTED Final   Enteroaggregative E coli (EAEC) NOT DETECTED NOT DETECTED Final   Enteropathogenic E coli (EPEC) NOT DETECTED NOT DETECTED Final   Enterotoxigenic E coli (ETEC) NOT DETECTED NOT DETECTED Final   Shiga like toxin producing E coli (STEC) NOT DETECTED NOT DETECTED Final   Shigella/Enteroinvasive E coli (EIEC) NOT DETECTED NOT DETECTED Final   Cryptosporidium NOT DETECTED NOT DETECTED Final   Cyclospora cayetanensis NOT DETECTED NOT DETECTED Final   Entamoeba histolytica NOT DETECTED NOT DETECTED Final   Giardia lamblia NOT DETECTED NOT DETECTED Final    Adenovirus F40/41 NOT DETECTED NOT DETECTED Final   Astrovirus NOT DETECTED NOT DETECTED Final   Norovirus GI/GII NOT DETECTED NOT DETECTED Final   Rotavirus A NOT DETECTED NOT DETECTED Final   Sapovirus (I, II, IV, and V) NOT DETECTED NOT DETECTED Final    Comment: Performed at [facility], [address] [place], [place] [address]  MRSA Next Gen by PCR, Nasal     Status: None   Collection Time: 04/16/24 11:05 AM   Specimen: Nasal Mucosa; Nasal  Swab  Result Value Ref Range Status   MRSA by PCR Next Gen NOT DETECTED NOT DETECTED Final    Comment: (NOTE) The GeneXpert MRSA Assay (FDA approved for NASAL specimens only), is one component of a comprehensive MRSA colonization surveillance program. It is not intended to diagnose MRSA infection nor to guide or monitor treatment for MRSA infections. Test performance is not FDA approved in patients less than 57 years old. Performed at [facility], [address] [place], [place] [address]   Culture, blood (Routine X 2) w Reflex to ID Panel     Status: Abnormal (Preliminary result)   Collection Time: 04/17/24  9:06 AM   Specimen: BLOOD RIGHT HAND  Result Value Ref Range Status   Specimen Description   Final    BLOOD RIGHT HAND Performed at [facility], [address] [place], [place] [address]    Special Requests   Final    BOTTLES DRAWN AEROBIC AND ANAEROBIC Blood Culture adequate volume Performed at [facility], [address], [place], [place] [address]    Culture  Setup Time   Final    GRAM POSITIVE COCCI IN BOTH AEROBIC AND ANAEROBIC BOTTLES RBV ELLEN JACKSON PHARM D 04/18/2024 @ 0530 BY DD GRAM NEGATIVE RODS ANAEROBIC BOTTLE ONLY    Culture (A)  Final    ENTEROCOCCUS FAECALIS KLEBSIELLA AEROGENES ENTEROCOCCUS GALLINARUM CRITICAL RESULT CALLED TO, READ BACK BY AND VERIFIED WITH: PHARMD A ELLINGTON 1037 [id number] FCP Performed at [facility], [address]  [place], [place] [address]    Report Status PENDING  Incomplete   Organism ID, Bacteria ENTEROCOCCUS FAECALIS  Final   Organism ID, Bacteria KLEBSIELLA AEROGENES  Final      Susceptibility   Klebsiella aerogenes - MIC*    CEFEPIME  0.25 SENSITIVE Sensitive     ERTAPENEM <=0.12 SENSITIVE Sensitive     CEFTRIAXONE  32 RESISTANT Resistant     CIPROFLOXACIN  <=0.06 SENSITIVE Sensitive     GENTAMICIN <=1 SENSITIVE Sensitive     MEROPENEM <=0.25 SENSITIVE Sensitive     TRIMETH/SULFA <=20 SENSITIVE Sensitive     PIP/TAZO Value in next row Resistant      >=128 RESISTANTThis is a modified FDA-approved test that has been validated and its performance characteristics determined by the reporting laboratory.  This laboratory is certified under the Clinical Laboratory Improvement Amendments CLIA as qualified to perform high complexity clinical laboratory testing.    * KLEBSIELLA AEROGENES   Enterococcus faecalis - MIC*    AMPICILLIN  Value in next row Sensitive      >=128 RESISTANTThis is a modified FDA-approved test that has been validated and its performance characteristics determined by the reporting laboratory.  This laboratory is certified under the Clinical Laboratory Improvement Amendments CLIA as qualified to perform high complexity clinical laboratory testing.    VANCOMYCIN Value in next row Sensitive      >=128 RESISTANTThis is a modified FDA-approved test that has been validated and its performance characteristics determined by the reporting laboratory.  This laboratory is certified under the Clinical Laboratory Improvement Amendments CLIA as qualified to perform high complexity clinical laboratory testing.    GENTAMICIN SYNERGY Value in next row Sensitive      >=128 RESISTANTThis is a modified FDA-approved test that has been validated and its performance characteristics determined by the reporting laboratory.  This laboratory is certified under the Clinical Laboratory Improvement Amendments CLIA as  qualified to perform high complexity clinical laboratory testing.    *  ENTEROCOCCUS FAECALIS  Culture, blood (Routine X 2) w Reflex to ID Panel     Status: Abnormal (Preliminary result)   Collection Time: 04/17/24  9:06 AM   Specimen: BLOOD LEFT HAND  Result Value Ref Range Status   Specimen Description   Final    BLOOD LEFT HAND Performed at [facility], [address], [place], [place] [address]    Special Requests   Final    BOTTLES DRAWN AEROBIC AND ANAEROBIC Blood Culture adequate volume Performed at [facility], [address], [place], [place] [address]    Culture  Setup Time   Final    GRAM POSITIVE COCCI IN BOTH AEROBIC AND ANAEROBIC BOTTLES CRITICAL VALUE NOTED.  VALUE IS CONSISTENT WITH PREVIOUSLY REPORTED AND CALLED VALUE.    Culture (A)  Final    ENTEROCOCCUS FAECALIS SUSCEPTIBILITIES PERFORMED ON PREVIOUS CULTURE WITHIN THE LAST 5 DAYS. ENTEROCOCCUS GALLINARUM SUSCEPTIBILITIES TO FOLLOW Performed at [facility], [address], [place], [place] [address]    Report Status PENDING  Incomplete  Blood Culture ID Panel (Reflexed)     Status: Abnormal   Collection Time: 04/17/24  9:06 AM  Result Value Ref Range Status   Enterococcus faecalis DETECTED (A) NOT DETECTED Final    Comment: CRITICAL RESULT CALLED TO, READ BACK BY AND VERIFIED WITH: RBV ELLEN JACKSON PHARM D 04/18/2024 @ 0530 BY DD    Enterococcus Faecium NOT DETECTED NOT DETECTED Final   Listeria monocytogenes NOT DETECTED NOT DETECTED Final   Staphylococcus species NOT DETECTED NOT DETECTED Final   Staphylococcus aureus (BCID) NOT DETECTED NOT DETECTED Final   Staphylococcus epidermidis NOT DETECTED NOT DETECTED Final   Staphylococcus lugdunensis NOT DETECTED NOT DETECTED Final   Streptococcus species NOT DETECTED NOT DETECTED Final   Streptococcus agalactiae NOT DETECTED NOT DETECTED Final   Streptococcus pneumoniae NOT DETECTED NOT DETECTED Final   Streptococcus  pyogenes NOT DETECTED NOT DETECTED Final   A.calcoaceticus-baumannii NOT DETECTED NOT DETECTED Final   Bacteroides fragilis NOT DETECTED NOT DETECTED Final   Enterobacterales DETECTED (A) NOT DETECTED Final    Comment: Enterobacterales represent a large order of gram negative bacteria, not a single organism. CRITICAL RESULT CALLED TO, READ BACK BY AND VERIFIED WITH: RBV ELLEN JACKSON PHARM D 04/18/2024 @ 0530 BY DD    Enterobacter cloacae complex NOT DETECTED NOT DETECTED Final   Escherichia coli NOT DETECTED NOT DETECTED Final   Klebsiella aerogenes DETECTED (A) NOT DETECTED Final    Comment: CRITICAL RESULT CALLED TO, READ BACK BY AND VERIFIED WITH: RBV ELLEN JACKSON PHARM D 04/18/2024 @ 0530 BY DD    Klebsiella oxytoca NOT DETECTED NOT DETECTED Final   Klebsiella pneumoniae NOT DETECTED NOT DETECTED Final   Proteus species NOT DETECTED NOT DETECTED Final   Salmonella species NOT DETECTED NOT DETECTED Final   Serratia marcescens NOT DETECTED NOT DETECTED Final   Haemophilus influenzae NOT DETECTED NOT DETECTED Final   Neisseria meningitidis NOT DETECTED NOT DETECTED Final   Pseudomonas aeruginosa NOT DETECTED NOT DETECTED Final   Stenotrophomonas maltophilia NOT DETECTED NOT DETECTED Final   Candida albicans NOT DETECTED NOT DETECTED Final   Candida auris NOT DETECTED NOT DETECTED Final   Candida glabrata NOT DETECTED NOT DETECTED Final   Candida krusei NOT DETECTED NOT DETECTED Final   Candida parapsilosis NOT DETECTED NOT DETECTED Final   Candida tropicalis NOT DETECTED NOT DETECTED Final   Cryptococcus neoformans/gattii NOT DETECTED NOT DETECTED Final   CTX-M ESBL NOT  DETECTED NOT DETECTED Final   Carbapenem resistance IMP NOT DETECTED NOT DETECTED Final   Carbapenem resistance KPC NOT DETECTED NOT DETECTED Final   Carbapenem resistance NDM NOT DETECTED NOT DETECTED Final   Carbapenem resist OXA 48 LIKE NOT DETECTED NOT DETECTED Final   Vancomycin resistance NOT DETECTED NOT  DETECTED Final   Carbapenem resistance VIM NOT DETECTED NOT DETECTED Final    Comment: Performed at [facility], [address] [place] [address] [place], [place] [address]  Body fluid culture w Gram Stain     Status: None   Collection Time: 04/17/24 12:59 PM   Specimen: Ascitic; Body Fluid  Result Value Ref Range Status   Specimen Description   Final    ASCITIC Performed at [facility], [address] [place], [place] [address]    Special Requests   Final    NONE Performed at [facility], [address] [place], [place] [address]    Gram Stain NO WBC SEEN NO ORGANISMS SEEN   Final   Culture   Final    NO GROWTH 3 DAYS Performed at [facility], [address] [place], [place] [address]    Report Status 04/20/2024 FINAL  Final  Culture, Respiratory w Gram Stain     Status: None   Collection Time: 04/17/24 11:41 PM   Specimen: Tracheal Aspirate; Respiratory  Result Value Ref Range Status   Specimen Description   Final    TRACHEAL ASPIRATE Performed at [facility], [address] [place], [place] [address]    Special Requests   Final    NONE Performed at [facility], [address] [place], [place] [address]    Gram Stain   Final    FEW WBC PRESENT, PREDOMINANTLY PMN NO ORGANISMS SEEN Performed at [facility], [address], [place], [place] [address]    Culture RARE KLEBSIELLA AEROGENES  Final   Report Status 04/20/2024 FINAL  Final   Organism ID, Bacteria KLEBSIELLA AEROGENES  Final      Susceptibility   Klebsiella aerogenes - MIC*    CEFEPIME  <=0.12 SENSITIVE Sensitive     ERTAPENEM <=0.12 SENSITIVE Sensitive     CEFTRIAXONE  <=0.25 SENSITIVE Sensitive     CIPROFLOXACIN  <=0.06 SENSITIVE Sensitive     GENTAMICIN <=1 SENSITIVE Sensitive     MEROPENEM <=0.25 SENSITIVE Sensitive     TRIMETH/SULFA <=20 SENSITIVE Sensitive     PIP/TAZO Value in next row Sensitive      <=4  SENSITIVEThis is a modified FDA-approved test that has been validated and its performance characteristics determined by the reporting laboratory.  This laboratory is certified under the Clinical Laboratory Improvement Amendments CLIA as qualified to perform high complexity clinical laboratory testing.    * RARE KLEBSIELLA AEROGENES  Culture, blood (Routine X 2) w Reflex to ID Panel     Status: Abnormal (Preliminary result)   Collection Time: 04/18/24  5:35 PM   Specimen: BLOOD RIGHT HAND  Result Value Ref Range Status   Specimen Description   Final    BLOOD RIGHT HAND Performed at [facility], [address], [place], [place] [address]    Special Requests   Final    BOTTLES DRAWN AEROBIC AND ANAEROBIC Blood Culture adequate volume Performed at [facility] [address] [place], [place] [address]    Culture  Setup Time   Final    YEAST AEROBIC BOTTLE ONLY CRITICAL RESULT  CALLED TO, READ BACK BY AND VERIFIED WITH: PHARMD CRYSTAL ROBERTSON [id number] AT 1635 BY EC Performed at [facility], [address] [place], [place] [address]    Culture CANDIDA GLABRATA (A)  Final   Report Status PENDING  Incomplete  Blood Culture ID Panel (Reflexed)     Status: Abnormal   Collection Time: 04/18/24  5:35 PM  Result Value Ref Range Status   Enterococcus faecalis NOT DETECTED NOT DETECTED Final   Enterococcus Faecium NOT DETECTED NOT DETECTED Final   Listeria monocytogenes NOT DETECTED NOT DETECTED Final   Staphylococcus species NOT DETECTED NOT DETECTED Final   Staphylococcus aureus (BCID) NOT DETECTED NOT DETECTED Final   Staphylococcus epidermidis NOT DETECTED NOT DETECTED Final   Staphylococcus lugdunensis NOT DETECTED NOT DETECTED Final   Streptococcus species NOT DETECTED NOT DETECTED Final   Streptococcus agalactiae NOT DETECTED NOT DETECTED Final   Streptococcus pneumoniae NOT DETECTED NOT DETECTED Final   Streptococcus pyogenes NOT DETECTED NOT DETECTED  Final   A.calcoaceticus-baumannii NOT DETECTED NOT DETECTED Final   Bacteroides fragilis NOT DETECTED NOT DETECTED Final   Enterobacterales NOT DETECTED NOT DETECTED Final   Enterobacter cloacae complex NOT DETECTED NOT DETECTED Final   Escherichia coli NOT DETECTED NOT DETECTED Final   Klebsiella aerogenes NOT DETECTED NOT DETECTED Final   Klebsiella oxytoca NOT DETECTED NOT DETECTED Final   Klebsiella pneumoniae NOT DETECTED NOT DETECTED Final   Proteus species NOT DETECTED NOT DETECTED Final   Salmonella species NOT DETECTED NOT DETECTED Final   Serratia marcescens NOT DETECTED NOT DETECTED Final   Haemophilus influenzae NOT DETECTED NOT DETECTED Final   Neisseria meningitidis NOT DETECTED NOT DETECTED Final   Pseudomonas aeruginosa NOT DETECTED NOT DETECTED Final   Stenotrophomonas maltophilia NOT DETECTED NOT DETECTED Final   Candida albicans NOT DETECTED NOT DETECTED Final   Candida auris NOT DETECTED NOT DETECTED Final   Candida glabrata DETECTED (A) NOT DETECTED Final    Comment: CRITICAL RESULT CALLED TO, READ BACK BY AND VERIFIED WITH: PHARMD CRYSTAL ROBERTSON [id number] AT 1635 BY EC    Candida krusei NOT DETECTED NOT DETECTED Final   Candida parapsilosis NOT DETECTED NOT DETECTED Final   Candida tropicalis NOT DETECTED NOT DETECTED Final   Cryptococcus neoformans/gattii NOT DETECTED NOT DETECTED Final    Comment: Performed at [facility], [address], [place], [place] [address]  Culture, blood (Routine X 2) w Reflex to ID Panel     Status: None   Collection Time: 04/18/24  5:39 PM   Specimen: BLOOD RIGHT HAND  Result Value Ref Range Status   Specimen Description   Final    BLOOD RIGHT HAND Performed at [facility], [address] [place], [place] [address]    Special Requests   Final    BOTTLES DRAWN AEROBIC ONLY Blood Culture results may not be optimal due to an inadequate volume of blood received in culture bottles Performed at [facility], [address], [place], [place] [address]    Culture   Final    NO GROWTH 5 DAYS Performed at [facility], [address] [place], [place] [address]    Report Status 04/23/2024 FINAL  Final  Cath Tip Culture     Status: None   Collection Time: 04/20/24 11:19 AM   Specimen: Porta Cath; Other  Result Value Ref Range Status   Specimen Description   Final    PORTA CATH Performed at [facility], [address], [place], [place] [address]    Special Requests   Final    NONE Performed at [facility], [address] [place], [place] [address]    Culture   Final    NO GROWTH 2 DAYS Performed at [facility], [address] [place], [place] [address]    Report Status 04/23/2024 FINAL  Final    Studies/Results: CT ABDOMEN PELVIS WO CONTRAST Result Date: 04/23/2024 EXAM: CT ABDOMEN AND PELVIS WITHOUT CONTRAST 04/23/2024 12:15:00 AM TECHNIQUE: CT of the abdomen and pelvis was performed without the administration of intravenous contrast. Multiplanar reformatted images are provided for review. Automated exposure control, iterative reconstruction, and/or weight-based adjustment of the mA/kV was utilized to reduce the radiation dose to as low as reasonably achievable. COMPARISON: CT with intravenous contrast 04/11/2024 and 03/15/2024. CLINICAL HISTORY: Bowel obstruction suspected; Distension. History of pancreatic adenocarcinoma metastatic to liver. There is recurrent ascites requiring multiple paracenteses in the last 2 months. The last image-guided paracentesis was 04/11/2024 with 4 liters clear yellow fluid removed. FINDINGS: LOWER CHEST: The tip of an infusion catheter is again noted in the upper right atrium. The cardiac size is normal. Small new pericardial effusion. There are bilateral symmetric small new layering pleural effusions. The cardiac blood pool is less dense than the myocardium consistent with anemia. There are  interstitial and diffuse increased ground glass opacities of the right greater than left lung bases consistent with pneumonia, edema or combination. There is a 1 cm new thin walled air cyst in the dorsal lingular base concerning for a cavitating septic thrombus. There is posterior atelectasis. LIVER: Hypodense liver metastases are again noted, with a cluster of metastases in the right lobe, the largest is 2.5 cm, previously 2.2 cm. There is a stable segment 4 lesion measuring 1.7 cm. No new liver abnormality is seen without contrast. GALLBLADDER AND BILE DUCTS: The gallbladder is absent and there is a common bile duct stent in place. There is no overt biliary dilatation within the limits of noncontrast technique. SPLEEN: The spleen is enlarged again measuring 18 cm in length. PANCREAS: Pancreatic head mass lesion with encasement of the SMV and severe mass effect effacing the portal vein, was better demonstrated with contrast although there likely has been no interval change. The pancreatic body and tail are unremarkable. ADRENAL GLANDS: The adrenal glands are unremarkable without contrast. KIDNEYS, URETERS AND BLADDER: The kidneys are unremarkable without contrast. No stones in the kidneys or ureters. No hydronephrosis. No perinephric or periureteral stranding. The bladder is catheterized and partially contracted but has a normal wall thickness. Small amount of air noted in the bladder. GI AND BOWEL: NGT is in place terminating in the distal stomach with unremarkable gastric wall. There are thickened folds in the small bowel but no small bowel obstruction. This probably represents congestive wall edema, alternatively enteritis . There is dense fluid throughout most of the colon, formed stool in the rectosigmoid segment, with uncomplicated sigmoid diverticulosis. No free air is seen. The last two studies demonstrated circumferential wall thickening in the ascending and transverse colon, but this is not seen today.  PERITONEUM AND RETROPERITONEUM: There is moderate free ascites, but less than previously. Diffuse mesenteric congestive change. No free air is seen. No free hemorrhage. VASCULATURE: Aorta is normal in caliber. Encasement of the SMV and severe mass effect effacing the portal vein (related to pancreatic head mass). LYMPH NODES: No lymphadenopathy. REPRODUCTIVE ORGANS: No acute abnormality. BONES AND SOFT TISSUES: Degenerative  change thoracic and lumbar spine. No destructive bone lesions. . There is diffuse worsening of edema throughout the body wall compatible with fluid overload and/or third spacing. No acute osseous abnormality. No focal soft tissue abnormality. IMPRESSION: 1. Pancreatic head mass with encasement of the SMV and severe mass effect effacing the portal vein, likely stable compared to prior contrast-enhanced study. No evidence of new metastatic disease in the abdomen or pelvis. 2. Hypodense liver metastases with slight interval increase of the largest right lobe lesion to 2.5 cm from 2.2 cm, and a stable segment 4 lesion measuring 1.7 cm. 3. Small new pericardial effusion and bilateral symmetric small new layering pleural effusions. 4. Right-greater-than-left basilar interstitial and ground-glass opacities that may reflect pneumonia, edema, or both. A new 1 cm thin-walled air cyst in the dorsal lingular base raises concern for a cavitating septic embolus. 5. Moderate free ascites, decreased from prior, with diffuse mesenteric congestion. 6. Thickened small-bowel folds likely reflecting congestive wall edema, alternatively enteritis . No small-bowel obstruction. 7. Interval resolution of prior circumferential wall thickening in the ascending and transverse colon. An appendix is not seen. . Electronically signed by: Francis Quam MD 04/23/2024 01:10 AM EST RP Workstation: [id number]   CT HEAD WO CONTRAST ( ) Result Date: 04/23/2024 CLINICAL DATA:  Initial evaluation for acute mental status change,  unknown cause. EXAM: CT HEAD WITHOUT CONTRAST TECHNIQUE: Contiguous axial images were obtained from the base of the skull through the vertex without intravenous contrast. RADIATION DOSE REDUCTION: This exam was performed according to the departmental dose-optimization program which includes automated exposure control, adjustment of the mA and/or kV according to patient size and/or use of iterative reconstruction technique. COMPARISON:  None Available. FINDINGS: Brain: Cerebral volume within normal limits. No acute intracranial hemorrhage. No acute large vessel territory infarct. No mass lesion, midline shift or mass effect. No hydrocephalus or extra-axial fluid collection. Vascular: No abnormal hyperdense vessel. Skull: Scalp soft tissues within normal limits.  Calvarium intact. Sinuses/Orbits: Globes orbital soft tissues within normal limits. Paranasal sinuses are clear. Trace right mastoid effusion noted, of doubtful significance. Left mastoid air cells are largely clear. Other: None. IMPRESSION: Normal head CT. No acute intracranial abnormality identified. Electronically Signed   By: Morene Hoard M.D.   On: 04/23/2024 00:32   Rapid EEG Result Date: 04/22/2024 Gregg Lek, MD     04/22/2024  8:09 PM Patient Name: Nicole Johnson MRN: [id number] Epilepsy Attending: Lek Gregg Referring Physician/Provider: No ref. provider found     Date: 04/22/2024 Start time: 04/22/2024 at 1150 AM End Time: 04/22/2024 at 0210 PM Duration: 2 hours and 19 minutes Patient history: 63 year old woman with metastatic pancreatic adenocarcinoma presenting with worsening abdominal pain and worsening mental status with seizure like activity during round. Ceribell to evaluate for seizure. Description: This EEG was obtained using a 10 lead EEG system positioned circumferentially without any parasagittal coverage (rapid EEG). Computer selected EEG is reviewed as  well as background features and all clinically significant events.  ABNORMALITY: Diffuse slowing IMPRESSION: This study is suggestive of a generalized brain dysfunction, such as encephalopathy, non specific etiology, likely related to medication side effect or toxic metabolic. No seizures seen during this recording. Lek Gregg [facility]   DG Chest Port 1 View Result Date: 04/22/2024 EXAM: 1 VIEW(S) XRAY OF THE CHEST 04/22/2024 05:56:53 AM COMPARISON: 04/20/2024 CLINICAL HISTORY: Acute respiratory failure (HCC) FINDINGS: LINES, TUBES AND DEVICES: Endotracheal tube in place with tip 1.5 cm above the carina. Esophageal temperature probe in place with  tip 3.3 cm above the carina. Enteric tube in place with tip terminating over the stomach. Left IJ CVC in place with tip overlying the right atrium. Right-sided chest port removed. LUNGS AND PLEURA: Diffuse bilateral airspace opacities, improved. No pleural effusion. No pneumothorax. HEART AND MEDIASTINUM: No acute abnormality of the cardiac and mediastinal silhouettes. BONES AND SOFT TISSUES: No acute osseous abnormality. IMPRESSION: 1. Diffuse bilateral airspace opacities, improved. Electronically signed by: Franky Stanford MD 04/22/2024 11:37 AM EST RP Workstation: [id number]      Assessment/Plan:  INTERVAL HISTORY: pt sp CT abdomen   Principal Problem:   Polymicrobial bacterial infection Active Problems:   Gastroenteritis   Antineoplastic chemotherapy induced anemia   Anemia   Acute respiratory failure with hypoxia (HCC)   Malnutrition of moderate degree   Candida glabrata infection   Fungemia    Nicole Johnson is a 63 y.o. female with metastatic pancreatic cancer on chemotherapy admitted with polymicrobial bacteremia/fungemia and septic shock with respiratory failure   She had port removed but then a new line placed that same day so she has not had a true central line holiday  She is currently on ampicillin , cefepime  and micafungin to cover all of the organisms that have grown from her blood  stream  She would NOT be safe or suitable for TEE  I do NOT think we should be pursuing aggressive care but that is apparently what the family desires  If we DO continue aggressive care and she survives this hospitalization would do 2 weeks of IV antibiotics after a line holiday  IN that situation she should also be seen by ophthalmology to exclude fungal endophthalmitis  #2 Vomiting: no SBO on CT scan  #3 Goals of care: would push for pivot to comfort care. HOpefully with time family will accept this  #4 Standard universal precautions   I have personally spent 50 minutes involved in face-to-face and non-face-to-face activities for this patient on the day of the visit. Professional time spent includes the following activities: Preparing to see the patient (review of tests), Obtaining and/or reviewing separately obtained history (admission/discharge record), Performing a medically appropriate examination and/or evaluation , Ordering medications/tests/procedures, referring and communicating with other health care professionals, Documenting clinical information in the EMR, Independently interpreting results (not separately reported), Communicating results to the patient/family/caregiver, Counseling and educating the patient/family/caregiver and Care coordination (not separately reported).   Evaluation of the patient requires complex antimicrobial therapy evaluation, counseling , isolation needs to reduce disease transmission and risk assessment and mitigation.      LOS: 11 days   Jomarie Fleeta Rothman 04/23/2024, 11:16 AM

## 2024-04-23 NOTE — Progress Notes (Signed)
 `  NAME:  Nicole Johnson, MRN:  [id number], DOB:  September 02, 1960, LOS: 11 ADMISSION DATE:  04/11/2024, CONSULTATION DATE:  04/23/24 REFERRING MD:  Dr. Earley, CHIEF COMPLAINT:  A. Fib with RVR.   History of Present Illness:  63 y.o. female with medical history significant of metastatic pancreatic adenocarcinoma to liver, pancytopenia, cirrhosis with ascites, GERD, presented with worsening of abdominal pain likely due to large ascites (LVP on 11/19, drained 4.2 L, improved abdominal pain)  with hospital course c/b SVT requiring hemodynamic monitoring in ICU.  HPI: Patient had her routine chemotherapy last Wednesday and she has been feeling fine afterwards.  Friday, patient and her husband ate a roasted chicken for lunch and both became sick in the evening, their symptoms including abdominal pain nauseous vomiting, they attributed to possible food toxin from the chicken.  Husband however recovered the next day and the patient on the other hand continue to feel sick, she had 1 day of diarrhea and continued to have nauseous and vomiting and decreased appetite for last 3 to 4 days, she could only tolerate some Ensure, and became very dehydrated.  She denied any hematemesis or hematochezia.  Her abdominal pain mainly located on lower abdomen bilateral, constant since yesterday.  She denied any fever or chills.  She told me that she is scheduled to have platelet transfusion at [facility] cancer center today.   ED Course: Afebrile, borderline tachycardia blood pressure 92/60, O2 saturation 100% on room air.  CT abdomen pelvis showed no acute findings, moderate ascites.  Blood work showed INR 1.6, sodium 128 BUN 39 creatinine 1.0 glucose 132 albumin  2.3, WBC 9.8 hemoglobin 8.0 compared to baseline 8.2-9.6, platelet 25.  Pertinent  Medical History   Past Medical History:  Diagnosis Date   Arthritis    Cancer (HCC)    pancreatic   GERD (gastroesophageal reflux disease)    PONV (postoperative nausea and vomiting)     Prediabetes    Significant Hospital Events: Including procedures, antibiotic start and stop dates in addition to other pertinent events   11/19 --> admitted, large volume paracentesis 4.2 L drained 11/22 --> SVT, given adenosine  6 mg and 12 mg, did not abort, came down to ICU for DCCV. Given amio bolus and infusion, came out of a. Fib into NSR. 11/23 --> overnight developed another episode of a. Fib with RVR, given another bolus of amio, back to NSR. Hgb down to 6.8, 1 unit of PRBCs ordered. 11/24 rebolus Amio for RVR , albumin  given 11/25 2.5 L paracentesis after Cherina transfusion, worsening shock 11/25 ETT >> 11/26 blood cultures showing Klebsiella and Enterococcus >> abx to cefepime  plus ampicillin  11/27 Amio to oral 11/28 platelet transfusion, Port-A-Cath DC'd, left IJ placed 12/1  Interim History / Subjective:   No overnight events We will open eyes when interaction Not able to follow commands  Objective    Blood pressure 136/66, pulse 80, temperature 98.7 F (37.1 C), temperature source Axillary, resp. rate (!) 24, height 5' 5 (1.651 m), weight 79.8 kg, last menstrual period 01/11/2011, SpO2 100%.    Vent Mode: PRVC FiO2 (%):  [40 %] 40 % Set Rate:  [24 bmp] 24 bmp Vt Set:  [450 mL] 450 mL PEEP:  [5 cmH20] 5 cmH20 Plateau Pressure:  [15 cmH20-22 cmH20] 15 cmH20   Intake/Output Summary (Last 24 hours) at 04/23/2024 0922 Last data filed at 04/23/2024 0602 Gross per 24 hour  Intake 1319.17 ml  Output 2550 ml  Net -1230.83 ml   Fredricka  Weights   04/21/24 0706 04/22/24 0500 04/23/24 0500  Weight: 80 kg 80 kg 79.8 kg    Examination: General: Frail, chronically ill-appearing Eyes: Anicteric, pupils reacting ENMT: Moist oral mucosa, endotracheal tube in place Skin: Warm and dry Neck: Supple, no JVD CV: S1-S2 appreciated, peripheral edema Resp: Decreased air movement at the bases Abdom: Soft, bowel sounds appreciated Neuro: Eyes open but does not follow commands,  RASS 0 to -1  I reviewed last 24 h vitals and pain scores, last 48 h intake and output, last 24 h labs and trends, and last 24 h imaging results. Potassium 3.2, BUN 68, creatinine 1.7 White count of 8.3 platelets of 22  Resolved problem list   Assessment and Plan   Septic shock from peritonitis with 1610 leukocytes Bacteremia with Klebsiella and fungemia - Had paracentesis for 1610 leukocytes - Antibiotics optimized-on cefepime , ampicillin , micafungin  - Continue midodrine  - Stress dose steroids discontinued  Atrial fibrillation with RVR - On amiodarone  - Low Chads2VASc score, thrombocytopenia precludes anticoagulation  Acute respiratory failure with hypoxia Intubated 11/25 Was able to wean on pressure support 10/5 today but got tired after a while -On Precedex  and fentanyl  as needed  AKI ATN due to septic shock -Avoid nephrotoxic medications -Strict I's/O's - Replete potassium  Pancreatic cancer Malignant ascites - Post paracentesis 11/6, 11/20, 11/25 - Continue bowel regimen  Severe thrombocytopenia - Received platelets 11/25, 11/28 - Transfuse platelets for goal greater than 10 or 50K for procedures - Hemoglobin transfusion greater than 7 per protocol  Appreciate palliative care involvement Appreciate infectious disease  Will continue to discuss with spouse Wean as tolerated  Labs   CBC: Recent Labs  Lab 04/19/24 0434 04/20/24 0613 04/20/24 2159 04/21/24 0538 04/22/24 0511 04/22/24 0740 04/22/24 1519 04/23/24 0511  WBC 9.8 7.9  --  5.6 4.3 4.5  --  8.3  NEUTROABS 9.1* 7.4  --  5.2 4.0  --   --  7.7  HGB 9.4* 8.2*  --  7.4* 7.1* 6.9* 9.7* 9.5*  HCT 28.9* 24.5*  --  22.5* 20.7* 20.6* 27.6* 27.5*  MCV 85.8 82.5  --  84.6 83.8 83.7  --  83.3  PLT 47* 29* 27* 26* 18* 18*  --  22*    Basic Metabolic Panel: Recent Labs  Lab 04/19/24 0434 04/20/24 0613 04/21/24 0538 04/22/24 0511 04/22/24 1519 04/23/24 0511  NA 129* 130* 133* 135 137 139  K 4.3  4.1 3.9 3.1* 3.2* 3.2*  CL 97* 98 101 102 103 105  CO2 22 23 24 23 22  21*  GLUCOSE 205* 171* 151* 129* 123* 120*  BUN 44* 56* 59* 63* 65* 68*  CREATININE 1.42* 1.52* 1.44* 1.48* 1.55* 1.70*  CALCIUM  8.1* 7.7* 7.8* 7.7* 8.0* 7.9*  MG 2.4 2.4 2.5* 2.6*  --  2.6*  PHOS 5.0* 4.5 4.3 3.8  --  4.0   GFR: Estimated Creatinine Clearance: 35.8 mL/min (A) (by C-G formula based on SCr of 1.7 mg/dL (H)). Recent Labs  Lab 04/21/24 0538 04/22/24 0511 04/22/24 0740 04/23/24 0511  WBC 5.6 4.3 4.5 8.3    Liver Function Tests: No results for input(s): AST, ALT, ALKPHOS, BILITOT, PROT, ALBUMIN  in the last 168 hours.  No results for input(s): LIPASE, AMYLASE in the last 168 hours.  No results for input(s): AMMONIA in the last 168 hours.   ABG    Component Value Date/Time   PHART 7.25 (L) 04/17/2024 1710   PCO2ART 45 04/17/2024 1710   PO2ART 115 (H) 04/17/2024  1710   HCO3 20.1 04/17/2024 1710   TCO2 23 10/08/2023 1227   ACIDBASEDEF 7.5 (H) 04/17/2024 1710   O2SAT 99.6 04/17/2024 1710     Coagulation Profile: No results for input(s): INR, PROTIME in the last 168 hours.   Cardiac Enzymes: No results for input(s): CKTOTAL, CKMB, CKMBINDEX, TROPONINI in the last 168 hours.  HbA1C: Hgb A1c MFr Bld  Date/Time Value Ref Range Status  04/15/2024 04:59 AM 6.1 (H) 4.8 - 5.6 % Final    Comment:    (NOTE) Diagnosis of Diabetes The following HbA1c ranges recommended by the American Diabetes Association (ADA) may be used as an aid in the diagnosis of diabetes mellitus.  Hemoglobin             Suggested A1C NGSP%              Diagnosis  <5.7                   Non Diabetic  5.7-6.4                Pre-Diabetic  >6.4                   Diabetic  <7.0                   Glycemic control for                       adults with diabetes.    03/14/2023 09:09 AM 6.2 4.6 - 6.5 % Final    Comment:    Glycemic Control Guidelines for People with Diabetes:Non  Diabetic:  <6%Goal of Therapy: <7%Additional Action Suggested:  >8%     CBG: Recent Labs  Lab 04/22/24 1536 04/22/24 1920 04/23/24 0020 04/23/24 0400 04/23/24 0733  GLUCAP 114* 117* 110* 109* 116*   The patient is critically ill with multiple organ systems failure and requires high complexity decision making for assessment and support, frequent evaluation and titration of therapies, application of advanced monitoring technologies and extensive interpretation of multiple databases. Critical Care Time devoted to patient care services described in this note independent of APP/resident time (if applicable)  is 40 minutes.   Jennet Epley MD [facility] Personal pager: See Amion If unanswered, please page CCM On-call: #[phone]

## 2024-04-23 NOTE — Plan of Care (Signed)
  Problem: Clinical Measurements: Goal: Ability to maintain clinical measurements within normal limits will improve Outcome: Progressing Goal: Will remain free from infection Outcome: Progressing Goal: Diagnostic test results will improve Outcome: Progressing Goal: Respiratory complications will improve Outcome: Progressing Goal: Cardiovascular complication will be avoided Outcome: Progressing   Problem: Activity: Goal: Risk for activity intolerance will decrease Outcome: Progressing   Problem: Coping: Goal: Level of anxiety will decrease Outcome: Progressing   Problem: Elimination: Goal: Will not experience complications related to bowel motility Outcome: Progressing Goal: Will not experience complications related to urinary retention Outcome: Progressing   Problem: Pain Managment: Goal: General experience of comfort will improve and/or be controlled Outcome: Progressing   Problem: Safety: Goal: Ability to remain free from injury will improve Outcome: Progressing   Problem: Skin Integrity: Goal: Risk for impaired skin integrity will decrease Outcome: Progressing   Problem: Coping: Goal: Ability to adjust to condition or change in health will improve Outcome: Progressing   Problem: Metabolic: Goal: Ability to maintain appropriate glucose levels will improve Outcome: Progressing   Problem: Skin Integrity: Goal: Risk for impaired skin integrity will decrease Outcome: Progressing   Problem: Tissue Perfusion: Goal: Adequacy of tissue perfusion will improve Outcome: Progressing   Problem: Activity: Goal: Ability to tolerate increased activity will improve Outcome: Progressing   Problem: Respiratory: Goal: Ability to maintain a clear airway and adequate ventilation will improve Outcome: Progressing   Problem: Role Relationship: Goal: Method of communication will improve Outcome: Progressing   Problem: Education: Goal: Knowledge of General Education  information will improve Description: Including pain rating scale, medication(s)/side effects and non-pharmacologic comfort measures Outcome: Not Progressing   Problem: Health Behavior/Discharge Planning: Goal: Ability to manage health-related needs will improve Outcome: Not Progressing   Problem: Nutrition: Goal: Adequate nutrition will be maintained Outcome: Not Progressing   Problem: Education: Goal: Ability to describe self-care measures that may prevent or decrease complications (Diabetes Survival Skills Education) will improve Outcome: Not Progressing Goal: Individualized Educational Video(s) Outcome: Not Progressing   Problem: Fluid Volume: Goal: Ability to maintain a balanced intake and output will improve Outcome: Not Progressing   Problem: Health Behavior/Discharge Planning: Goal: Ability to identify and utilize available resources and services will improve Outcome: Not Progressing Goal: Ability to manage health-related needs will improve Outcome: Not Progressing   Problem: Nutritional: Goal: Maintenance of adequate nutrition will improve Outcome: Not Progressing Goal: Progress toward achieving an optimal weight will improve Outcome: Not Progressing

## 2024-04-24 DIAGNOSIS — Z7189 Other specified counseling: Secondary | ICD-10-CM

## 2024-04-24 DIAGNOSIS — A499 Bacterial infection, unspecified: Secondary | ICD-10-CM | POA: Diagnosis not present

## 2024-04-24 DIAGNOSIS — Z515 Encounter for palliative care: Secondary | ICD-10-CM

## 2024-04-24 LAB — CBC WITH DIFFERENTIAL/PLATELET
Abs Immature Granulocytes: 0.06 K/uL (ref 0.00–0.07)
Basophils Absolute: 0 K/uL (ref 0.0–0.1)
Basophils Relative: 0 %
Eosinophils Absolute: 0 K/uL (ref 0.0–0.5)
Eosinophils Relative: 0 %
HCT: 26.6 % — ABNORMAL LOW (ref 36.0–46.0)
Hemoglobin: 8.8 g/dL — ABNORMAL LOW (ref 12.0–15.0)
Immature Granulocytes: 1 %
Lymphocytes Relative: 2 %
Lymphs Abs: 0.2 K/uL — ABNORMAL LOW (ref 0.7–4.0)
MCH: 28.9 pg (ref 26.0–34.0)
MCHC: 33.1 g/dL (ref 30.0–36.0)
MCV: 87.5 fL (ref 80.0–100.0)
Monocytes Absolute: 0.3 K/uL (ref 0.1–1.0)
Monocytes Relative: 3 %
Neutro Abs: 9 K/uL — ABNORMAL HIGH (ref 1.7–7.7)
Neutrophils Relative %: 94 %
Platelets: 25 K/uL — CL (ref 150–400)
RBC: 3.04 MIL/uL — ABNORMAL LOW (ref 3.87–5.11)
RDW: 18 % — ABNORMAL HIGH (ref 11.5–15.5)
WBC: 9.6 K/uL (ref 4.0–10.5)
nRBC: 0 % (ref 0.0–0.2)

## 2024-04-24 LAB — GLUCOSE, CAPILLARY
Glucose-Capillary: 102 mg/dL — ABNORMAL HIGH (ref 70–99)
Glucose-Capillary: 104 mg/dL — ABNORMAL HIGH (ref 70–99)
Glucose-Capillary: 112 mg/dL — ABNORMAL HIGH (ref 70–99)
Glucose-Capillary: 115 mg/dL — ABNORMAL HIGH (ref 70–99)
Glucose-Capillary: 121 mg/dL — ABNORMAL HIGH (ref 70–99)
Glucose-Capillary: 122 mg/dL — ABNORMAL HIGH (ref 70–99)

## 2024-04-24 LAB — BASIC METABOLIC PANEL WITH GFR
Anion gap: 13 (ref 5–15)
BUN: 72 mg/dL — ABNORMAL HIGH (ref 8–23)
CO2: 21 mmol/L — ABNORMAL LOW (ref 22–32)
Calcium: 8 mg/dL — ABNORMAL LOW (ref 8.9–10.3)
Chloride: 109 mmol/L (ref 98–111)
Creatinine, Ser: 1.74 mg/dL — ABNORMAL HIGH (ref 0.44–1.00)
GFR, Estimated: 33 mL/min — ABNORMAL LOW (ref >60)
Glucose, Bld: 118 mg/dL — ABNORMAL HIGH (ref 70–99)
Potassium: 3.2 mmol/L — ABNORMAL LOW (ref 3.5–5.1)
Sodium: 143 mmol/L (ref 135–145)

## 2024-04-24 LAB — PHOSPHORUS: Phosphorus: 4.7 mg/dL — ABNORMAL HIGH (ref 2.5–4.6)

## 2024-04-24 LAB — MAGNESIUM: Magnesium: 2.8 mg/dL — ABNORMAL HIGH (ref 1.7–2.4)

## 2024-04-24 MED ORDER — FUROSEMIDE 10 MG/ML IJ SOLN
40.0000 mg | Freq: Once | INTRAMUSCULAR | Status: AC
  Administered 2024-04-24: 40 mg via INTRAVENOUS
  Filled 2024-04-24: qty 4

## 2024-04-24 MED ORDER — ALBUMIN HUMAN 25 % IV SOLN
25.0000 g | Freq: Once | INTRAVENOUS | Status: AC
  Administered 2024-04-24: 25 g via INTRAVENOUS
  Filled 2024-04-24: qty 100

## 2024-04-24 MED ORDER — POTASSIUM CHLORIDE 20 MEQ PO PACK: 80.0000 meq | PACK | Freq: Once | ORAL | Status: DC

## 2024-04-24 MED ORDER — POTASSIUM CHLORIDE 20 MEQ PO PACK
40.0000 meq | PACK | Freq: Two times a day (BID) | ORAL | Status: AC
  Administered 2024-04-24 (×2): 40 meq
  Filled 2024-04-24 (×2): qty 2

## 2024-04-24 MED ORDER — SODIUM CHLORIDE 0.9% FLUSH: 10.0000 mL | INTRAVENOUS | Status: DC | PRN

## 2024-04-24 NOTE — Progress Notes (Addendum)
 Nicole Johnson   DOB:09-Mar-1961   FM#:[id number]      ASSESSMENT & PLAN:  Nicole Johnson 63 y.o. female with history significant for pancreatic cancer which is treated at [facility].  Patient was admitted 04/11/2024 from home due to complaints of abdominal pain, nausea, vomiting, and diarrhea. Medical Oncology following.   Pancreatic adenocarcinoma with liver mets - Initially diagnosed 01/06/2023 as a stage Ib. - Patient was seen at [facility] by Dr. Lanny however transferred her care to [facility] on 01/27/2023.  Mass was deemed unresectable therefore she started chemotherapy.  Status post FOLFOX chemotherapy regimen.  Due to cytopenias treatment plan was changed to radiation with Xeloda.   -- More recently was started on gemcitabine/nab-paclitaxel.  Received gemcitabine alone on 04/04/2024. - CA 19-9 levels trended by primary oncologist, last level 197. -- MR liver done 03/17/24 suspicion of new liver mets.   -- However CT imaging done 04/22/24 shows pancreatic head mass, likely stable with no evidence of new mets in abdomen or pelvis.  - Primary onc at [facility]/Dr. Donato.     -- Medical oncology/Dr. Pasam initially followed during this admission.  Dr. Lanny following at this time and will make further recommendations.  Acute respiratory failure -- Intubated 04/17/24, being weaned -- CT head 11/30 shows no acute intracranial abnormality -- Continue supportive care  Sepsis -- Culture positive for enterococcus faecalis, enterobacterales, klebsiella, candida glabrata -- Port was removed.  -- On IV antibiotics, continue as ordered -- ID following   Leukopenia -resolved - Resolved. WBC normalized 9.6 today.  - States that she receives G CSF and most recently received last week after chemotherapy treatment.  However noted on [facility] documentation 04/04/2024 that G-CSF was held.  Anemia Iron deficiency anemia - Hemoglobin 8.8.  Status post PRBC transfusions this admission.  - Recommend PRBC transfusion for hemoglobin  <7.0.    - Status post IV iron given at [facility] hematologist office 02/22/2024.  Thrombocytopenia - Platelets low 25K.  Status post multiple units of platelet transfusions this admission.   - Recommend platelet transfusion for counts <20 K or <50 K with bleeding.   - Previously prescribed Promacta  75 mg p.o. daily - States that she has been on injection every week for her platelets.  This is confirmed in [facility] documentation 04/04/2024.   -- Status post Nplate  65 mcg, given on 11/21.    - Continue to monitor CBC with differential closely   Ascites Hyperbilirubinemia  - Secondary to malignancy - Status post multiple paracentesis with fluid removed. -- Elevated bilirubin -- Monitor closely.     Code Status Full   Subjective:  Patient seen intubated however being weaned from vent. Spouse at bedside holding hand and asking that patient is kept calm and not to touch right now.  She is ill-appearing.    Objective:   Intake/Output Summary (Last 24 hours) at 04/24/2024 1146 Last data filed at 04/24/2024 1103 Gross per 24 hour  Intake 759.85 ml  Output 2000 ml  Net -1240.15 ml     PHYSICAL EXAMINATION: ECOG PERFORMANCE STATUS: 4 - Bedbound  Vitals:   04/24/24 0739 04/24/24 0923  BP:    Pulse: 80   Resp: (!) 22   Temp: 98.3 F (36.8 C)   SpO2: 100% 100%   Filed Weights   04/22/24 0500 04/23/24 0500 04/24/24 0439  Weight: 176 lb 5.9 oz (80 kg) 176 lb (79.8 kg) 172 lb (78 kg)    GENERAL: +intubated SKIN: +pale skin color, texture, turgor are normal,  no rashes or significant lesions EYES: +eyes closed OROPHARYNX: +intubated NECK: supple, thyroid  normal size, non-tender, without nodularity LYMPH: no palpable lymphadenopathy in the cervical, axillary or inguinal LUNGS: +process of weaning off vent HEART: regular rate & rhythm and no murmurs and no lower extremity edema ABDOMEN: abdomen soft, non-tender and normal bowel sounds MUSCULOSKELETAL: no cyanosis of digits and no  clubbing  PSYCH:+unable to determine NEURO: no focal motor/sensory deficits   All questions were answered. The patient knows to call the clinic with any problems, questions or concerns.   The total time spent in the appointment was 40 minutes encounter with patient including review of chart and various tests results, discussions about plan of care and coordination of care plan  Olam JINNY Brunner, NP 04/24/2024 11:46 AM    Labs Reviewed:  Lab Results  Component Value Date   WBC 9.6 04/24/2024   HGB 8.8 (L) 04/24/2024   HCT 26.6 (L) 04/24/2024   MCV 87.5 04/24/2024   PLT 25 (LL) 04/24/2024   Recent Labs    01/11/24 1112 01/18/24 1056 04/12/24 0813 04/13/24 0020 04/14/24 1201 04/15/24 0459 04/16/24 0445 04/22/24 1519 04/23/24 0511 04/24/24 0443  NA 136   < > 131*   < > 132* 132*   < > 137 139 143  K 3.9   < > 4.6   < > 4.2 3.7   < > 3.2* 3.2* 3.2*  CL 101   < > 100   < > 101 100   < > 103 105 109  CO2 31   < > 22   < > 21* 23   < > 22 21* 21*  GLUCOSE 124*   < > 96   < > 139* 136*   < > 123* 120* 118*  BUN 12   < > 36*   < > 38* 38*   < > 65* 68* 72*  CREATININE 0.81   < > 1.12*   < > 1.10* 0.98   < > 1.55* 1.70* 1.74*  CALCIUM  8.4   < > 8.4*   < > 7.9* 7.8*   < > 8.0* 7.9* 8.0*  GFRNONAA  --    < > 55*   < > 57* >60   < > 37* 34* 33*  PROT 6.0   < > 4.9*  --  4.8* 4.8*  --   --   --   --   ALBUMIN  3.0*   < > 2.9*  --  2.3* 3.0*  --   --   --   --   AST 36   < > 22  --  45* 34  --   --   --   --   ALT 55*   < > 19  --  24 19  --   --   --   --   ALKPHOS 303*   < > 147*  --  135* 94  --   --   --   --   BILITOT 0.3   < > 1.8*  --  3.2* 2.6*  --   --   --   --   BILIDIR 0.1  --   --   --   --   --   --   --   --   --    < > = values in this interval not displayed.    Studies Reviewed:  CT ABDOMEN PELVIS WO CONTRAST Result Date: 04/23/2024 EXAM: CT ABDOMEN AND  PELVIS WITHOUT CONTRAST 04/23/2024 12:15:00 AM TECHNIQUE: CT of the abdomen and pelvis was performed without the  administration of intravenous contrast. Multiplanar reformatted images are provided for review. Automated exposure control, iterative reconstruction, and/or weight-based adjustment of the mA/kV was utilized to reduce the radiation dose to as low as reasonably achievable. COMPARISON: CT with intravenous contrast 04/11/2024 and 03/15/2024. CLINICAL HISTORY: Bowel obstruction suspected; Distension. History of pancreatic adenocarcinoma metastatic to liver. There is recurrent ascites requiring multiple paracenteses in the last 2 months. The last image-guided paracentesis was 04/11/2024 with 4 liters clear yellow fluid removed. FINDINGS: LOWER CHEST: The tip of an infusion catheter is again noted in the upper right atrium. The cardiac size is normal. Small new pericardial effusion. There are bilateral symmetric small new layering pleural effusions. The cardiac blood pool is less dense than the myocardium consistent with anemia. There are interstitial and diffuse increased ground glass opacities of the right greater than left lung bases consistent with pneumonia, edema or combination. There is a 1 cm new thin walled air cyst in the dorsal lingular base concerning for a cavitating septic thrombus. There is posterior atelectasis. LIVER: Hypodense liver metastases are again noted, with a cluster of metastases in the right lobe, the largest is 2.5 cm, previously 2.2 cm. There is a stable segment 4 lesion measuring 1.7 cm. No new liver abnormality is seen without contrast. GALLBLADDER AND BILE DUCTS: The gallbladder is absent and there is a common bile duct stent in place. There is no overt biliary dilatation within the limits of noncontrast technique. SPLEEN: The spleen is enlarged again measuring 18 cm in length. PANCREAS: Pancreatic head mass lesion with encasement of the SMV and severe mass effect effacing the portal vein, was better demonstrated with contrast although there likely has been no interval change. The  pancreatic body and tail are unremarkable. ADRENAL GLANDS: The adrenal glands are unremarkable without contrast. KIDNEYS, URETERS AND BLADDER: The kidneys are unremarkable without contrast. No stones in the kidneys or ureters. No hydronephrosis. No perinephric or periureteral stranding. The bladder is catheterized and partially contracted but has a normal wall thickness. Small amount of air noted in the bladder. GI AND BOWEL: NGT is in place terminating in the distal stomach with unremarkable gastric wall. There are thickened folds in the small bowel but no small bowel obstruction. This probably represents congestive wall edema, alternatively enteritis . There is dense fluid throughout most of the colon, formed stool in the rectosigmoid segment, with uncomplicated sigmoid diverticulosis. No free air is seen. The last two studies demonstrated circumferential wall thickening in the ascending and transverse colon, but this is not seen today. PERITONEUM AND RETROPERITONEUM: There is moderate free ascites, but less than previously. Diffuse mesenteric congestive change. No free air is seen. No free hemorrhage. VASCULATURE: Aorta is normal in caliber. Encasement of the SMV and severe mass effect effacing the portal vein (related to pancreatic head mass). LYMPH NODES: No lymphadenopathy. REPRODUCTIVE ORGANS: No acute abnormality. BONES AND SOFT TISSUES: Degenerative change thoracic and lumbar spine. No destructive bone lesions. . There is diffuse worsening of edema throughout the body wall compatible with fluid overload and/or third spacing. No acute osseous abnormality. No focal soft tissue abnormality. IMPRESSION: 1. Pancreatic head mass with encasement of the SMV and severe mass effect effacing the portal vein, likely stable compared to prior contrast-enhanced study. No evidence of new metastatic disease in the abdomen or pelvis. 2. Hypodense liver metastases with slight interval increase of the largest right lobe lesion  to 2.5 cm from 2.2 cm, and a stable segment 4 lesion measuring 1.7 cm. 3. Small new pericardial effusion and bilateral symmetric small new layering pleural effusions. 4. Right-greater-than-left basilar interstitial and ground-glass opacities that may reflect pneumonia, edema, or both. A new 1 cm thin-walled air cyst in the dorsal lingular base raises concern for a cavitating septic embolus. 5. Moderate free ascites, decreased from prior, with diffuse mesenteric congestion. 6. Thickened small-bowel folds likely reflecting congestive wall edema, alternatively enteritis . No small-bowel obstruction. 7. Interval resolution of prior circumferential wall thickening in the ascending and transverse colon. An appendix is not seen. . Electronically signed by: Francis Quam MD 04/23/2024 01:10 AM EST RP Workstation: [id number]   CT HEAD WO CONTRAST ( ) Result Date: 04/23/2024 CLINICAL DATA:  Initial evaluation for acute mental status change, unknown cause. EXAM: CT HEAD WITHOUT CONTRAST TECHNIQUE: Contiguous axial images were obtained from the base of the skull through the vertex without intravenous contrast. RADIATION DOSE REDUCTION: This exam was performed according to the departmental dose-optimization program which includes automated exposure control, adjustment of the mA and/or kV according to patient size and/or use of iterative reconstruction technique. COMPARISON:  None Available. FINDINGS: Brain: Cerebral volume within normal limits. No acute intracranial hemorrhage. No acute large vessel territory infarct. No mass lesion, midline shift or mass effect. No hydrocephalus or extra-axial fluid collection. Vascular: No abnormal hyperdense vessel. Skull: Scalp soft tissues within normal limits.  Calvarium intact. Sinuses/Orbits: Globes orbital soft tissues within normal limits. Paranasal sinuses are clear. Trace right mastoid effusion noted, of doubtful significance. Left mastoid air cells are largely clear. Other:  None. IMPRESSION: Normal head CT. No acute intracranial abnormality identified. Electronically Signed   By: Morene Hoard M.D.   On: 04/23/2024 00:32   Rapid EEG Result Date: 04/22/2024 Gregg Lek, MD     04/22/2024  8:09 PM Patient Name: CESIA ORF MRN: [id number] Epilepsy Attending: Lek Gregg Referring Physician/Provider: No ref. provider found     Date: 04/22/2024 Start time: 04/22/2024 at 1150 AM End Time: 04/22/2024 at 0210 PM Duration: 2 hours and 19 minutes Patient history: 63 year old woman with metastatic pancreatic adenocarcinoma presenting with worsening abdominal pain and worsening mental status with seizure like activity during round. Ceribell to evaluate for seizure. Description: This EEG was obtained using a 10 lead EEG system positioned circumferentially without any parasagittal coverage (rapid EEG). Computer selected EEG is reviewed as  well as background features and all clinically significant events. ABNORMALITY: Diffuse slowing IMPRESSION: This study is suggestive of a generalized brain dysfunction, such as encephalopathy, non specific etiology, likely related to medication side effect or toxic metabolic. No seizures seen during this recording. Lek Gregg [facility] Neu[facility]   DG Chest Port 1 View Result Date: 04/22/2024 EXAM: 1 VIEW(S) XRAY OF THE CHEST 04/22/2024 05:56:53 AM COMPARISON: 04/20/2024 CLINICAL HISTORY: Acute respiratory failure (HCC) FINDINGS: LINES, TUBES AND DEVICES: Endotracheal tube in place with tip 1.5 cm above the carina. Esophageal temperature probe in place with tip 3.3 cm above the carina. Enteric tube in place with tip terminating over the stomach. Left IJ CVC in place with tip overlying the right atrium. Right-sided chest port removed. LUNGS AND PLEURA: Diffuse bilateral airspace opacities, improved. No pleural effusion. No pneumothorax. HEART AND MEDIASTINUM: No acute abnormality of the cardiac and mediastinal silhouettes. BONES AND SOFT TISSUES: No  acute osseous abnormality. IMPRESSION: 1. Diffuse bilateral airspace opacities, improved. Electronically signed by: Franky Stanford MD 04/22/2024 11:37 AM EST RP Workstation: [id number]  IR REMOVAL TUN ACCESS W/ PORT W/O FL MOD SED Result Date: 04/20/2024 CLINICAL DATA:  Sepsis, bacteremia, port catheter removal requested EXAM: EXAM TUNNELED PORT CATHETER REMOVAL TECHNIQUE: Overlying skin prepped with chlorhexidine , draped in usual sterile fashion, infiltrated locally with 1% lidocaine . A small incision was made over the scar from previous placement. The port catheter was dissected free from the underlying soft tissues and removed intact. Catheter tip was cut and sent for culture as requested. Hemostasis was achieved. The port pocket was closed with deep interrupted and subcuticular continuous 3-0 Monocryl sutures, then covered with Dermabond. The patient tolerated the procedure well. COMPLICATIONS: COMPLICATIONS None immediate IMPRESSION: 1.  Technically successful tunneled Port catheter removal. Electronically Signed   By: JONETTA Faes M.D.   On: 04/20/2024 15:53   DG CHEST PORT 1 VIEW Result Date: 04/20/2024 CLINICAL DATA:  Central line placement. EXAM: PORTABLE CHEST 1 VIEW COMPARISON:  04/19/2024. FINDINGS: Left internal jugular central venous catheter has its tip projecting in the right atrium. No pneumothorax. Endotracheal tube, nasogastric tube and right anterior chest wall Port-A-Cath are stable. Bilateral airspace lung opacities are unchanged from the previous day's exam. IMPRESSION: 1. New left internal jugular central venous catheter has its tip in the right atrium. No pneumothorax. 2. No other change from the previous day's study. Persistent bilateral airspace lung opacities. Electronically Signed   By: Alm Parkins M.D.   On: 04/20/2024 12:53   DG Chest Port 1 View Result Date: 04/19/2024 EXAM: 1 VIEW(S) XRAY OF THE CHEST 04/19/2024 05:33:00 AM COMPARISON: Portable chest yesterday at 01:55 pm.  CLINICAL HISTORY: 5626 Acute respiratory failure (HCC) 5626 Acute respiratory failure (HCC). FINDINGS: LINES, TUBES AND DEVICES: Ett terminates 3 cm from the carina. Esophageal temperature probe terminates at this same level. Ngt in the stomach but the intra-gastric course is out of view. Right IJ port catheter terminates in the upper right atrium. LUNGS AND PLEURA: Interstitial and patchy diffuse airspace disease of the bilateral lungs is again noted consistent with pneumonia, edema or combination, with small pleural effusions. No interval improvement or worsening is seen. No pneumothorax. HEART AND MEDIASTINUM: The mediastinum is stable. There is mild cardiomegaly, mild central vascular fullness. BONES AND SOFT TISSUES: Thoracic spondylosis and bridging enthesopathy. IMPRESSION: 1. Interstitial and patchy diffuse airspace disease of both lungs, consistent with pneumonia, edema, or a combination, with small pleural effusions, without interval change. 2. Mild cardiomegaly and mild central vascular congestion. Electronically signed by: Francis Quam MD 04/19/2024 07:47 AM EST RP Workstation: [id number]   DG CHEST PORT 1 VIEW Result Date: 04/17/2024 CLINICAL DATA:  Acute respiratory failure, hypoxia EXAM: PORTABLE CHEST 1 VIEW COMPARISON:  03/15/2024 FINDINGS: Single frontal view of the chest demonstrates stable right chest wall port. Cardiac silhouette is unremarkable. Interval development of multifocal bilateral ground-glass airspace disease, right greater than left. No effusion or pneumothorax. Moderate gaseous distention of the stomach. IMPRESSION: 1. Multifocal bilateral ground-glass airspace disease, consistent with edema or widespread pneumonia. Electronically Signed   By: Ozell Daring M.D.   On: 04/17/2024 15:24   DG Chest Port 1 View Result Date: 04/17/2024 CLINICAL DATA:  [id number] Endotracheally intubated [id number] EXAM: PORTABLE CHEST - 1 VIEW COMPARISON:  April 17, 2024 8:34 a.m. FINDINGS: Right  chest port terminates at the cavoatrial junction. Esophagogastric tube courses below the diaphragm with the distal tip not included in the field of view. Endotracheal tube in place terminating in the mid trachea. Similar extensive hazy airspace opacities throughout both lungs. No large pleural effusion or  pneumothorax. No cardiomegaly. IMPRESSION: 1. No significant interval change to the lungs. 2. Endotracheal tube has been placed in the interim, terminating in the mid trachea. No pneumothorax. 3. Esophagogastric tube courses below the diaphragm with the distal tip not included in the field of view Electronically Signed   By: Rogelia Myers M.D.   On: 04/17/2024 15:22   DG Abd 1 View Result Date: 04/17/2024 CLINICAL DATA:  Enteric catheter placement EXAM: ABDOMEN - 1 VIEW COMPARISON:  04/11/2024 FINDINGS: Frontal view of the lower chest and upper abdomen was obtained, excluding the left flank by collimation. Enteric catheter passes below diaphragm, tip projects over the region of the pylorus. Metallic biliary stent overlies right upper quadrant. Nonspecific gaseous distension of the large and small bowel, with moderate stool throughout the colon. Patchy bibasilar airspace disease, right greater than left. IMPRESSION: 1. Enteric catheter tip projecting over the gastric pylorus. 2. Nonspecific gaseous distention of the bowel, with moderate stool throughout the colon. 3. Bibasilar airspace disease. Electronically Signed   By: Ozell Daring M.D.   On: 04/17/2024 15:21   ECHOCARDIOGRAM COMPLETE Result Date: 04/15/2024    ECHOCARDIOGRAM REPORT   Patient Name:   CHER FRANZONI Date of Exam: 04/15/2024 Medical Rec #:  [id number]    Height:       65.0 in Accession #:    [id number]   Weight:       138.2 lb Date of Birth:  1961-02-12    BSA:          1.691 m Patient Age:    63 years     BP:           101/60 mmHg Patient Gender: F            HR:           120 bpm. Exam Location:  Inpatient Procedure: 2D Echo, Cardiac  Doppler and Color Doppler (Both Spectral and Color            Flow Doppler were utilized during procedure). Indications:    Shock R57.9  History:        Patient has no prior history of Echocardiogram examinations.  Sonographer:    Tinnie Gosling RDCS Referring Phys: [id number] PAULA SOUTHERLY IMPRESSIONS  1. Left ventricular ejection fraction, by estimation, is 65 to 70%. The left ventricle has normal function. The left ventricle has no regional wall motion abnormalities. Left ventricular diastolic parameters are indeterminate. Elevated left atrial pressure.  2. Right ventricular systolic function is normal. The right ventricular size is normal. There is mildly elevated pulmonary artery systolic pressure. The estimated right ventricular systolic pressure is 36.5 mmHg.  3. Left atrial size was mildly dilated.  4. Right atrial size was mildly dilated.  5. The mitral valve is grossly normal. Trivial mitral valve regurgitation. No evidence of mitral stenosis.  6. Tricuspid valve regurgitation is moderate.  7. The aortic valve is tricuspid. Aortic valve regurgitation is not visualized. No aortic stenosis is present.  8. The inferior vena cava is dilated in size with >50% respiratory variability, suggesting right atrial pressure of 8 mmHg.  9. Rhythm strip during this exam demonstrates atrial fibrillation with rapid ventricular response. FINDINGS  Left Ventricle: Left ventricular ejection fraction, by estimation, is 65 to 70%. The left ventricle has normal function. The left ventricle has no regional wall motion abnormalities. The left ventricular internal cavity size was normal in size. There is  no left ventricular hypertrophy. Left ventricular diastolic parameters are indeterminate. Elevated  left atrial pressure. Right Ventricle: The right ventricular size is normal. No increase in right ventricular wall thickness. Right ventricular systolic function is normal. There is mildly elevated pulmonary artery systolic pressure.  The tricuspid regurgitant velocity is 2.67  m/s, and with an assumed right atrial pressure of 8 mmHg, the estimated right ventricular systolic pressure is 36.5 mmHg. Left Atrium: Left atrial size was mildly dilated. Right Atrium: Right atrial size was mildly dilated. Pericardium: There is no evidence of pericardial effusion. Mitral Valve: The mitral valve is grossly normal. Trivial mitral valve regurgitation. No evidence of mitral valve stenosis. Tricuspid Valve: The tricuspid valve is normal in structure. Tricuspid valve regurgitation is moderate . No evidence of tricuspid stenosis. Aortic Valve: The aortic valve is tricuspid. Aortic valve regurgitation is not visualized. No aortic stenosis is present. Aortic valve mean gradient measures 5.8 mmHg. Aortic valve peak gradient measures 12.2 mmHg. Aortic valve area, by VTI measures 1.99  cm. Pulmonic Valve: The pulmonic valve was normal in structure. Pulmonic valve regurgitation is trivial. No evidence of pulmonic stenosis. Aorta: The aortic root is normal in size and structure. Venous: The inferior vena cava is dilated in size with greater than 50% respiratory variability, suggesting right atrial pressure of 8 mmHg. IAS/Shunts: No atrial level shunt detected by color flow Doppler. EKG: Rhythm strip during this exam demonstrates atrial fibrillation.  LEFT VENTRICLE PLAX 2D LVIDd:         3.40 cm   Diastology LVIDs:         2.10 cm   LV e' medial:    9.79 cm/s LV PW:         0.90 cm   LV E/e' medial:  12.7 LV IVS:        0.90 cm   LV e' lateral:   14.00 cm/s LVOT diam:     1.80 cm   LV E/e' lateral: 8.9 LV SV:         47 LV SV Index:   28 LVOT Area:     2.54 cm  RIGHT VENTRICLE             IVC RV S prime:     21.00 cm/s  IVC diam: 2.10 cm TAPSE (M-mode): 2.4 cm LEFT ATRIUM             Index        RIGHT ATRIUM           Index LA diam:        3.80 cm 2.25 cm/m   RA Area:     15.10 cm LA Vol (A2C):   56.9 ml 33.65 ml/m  RA Volume:   40.30 ml  23.84 ml/m LA Vol  (A4C):   50.7 ml 29.99 ml/m LA Biplane Vol: 55.6 ml 32.89 ml/m  AORTIC VALVE AV Area (Vmax):    2.08 cm AV Area (Vmean):   2.18 cm AV Area (VTI):     1.99 cm AV Vmax:           174.95 cm/s AV Vmean:          113.914 cm/s AV VTI:            0.237 m AV Peak Grad:      12.2 mmHg AV Mean Grad:      5.8 mmHg LVOT Vmax:         143.00 cm/s LVOT Vmean:        97.600 cm/s LVOT VTI:  0.185 m LVOT/AV VTI ratio: 0.78  AORTA Ao Root diam: 2.90 cm Ao Asc diam:  3.20 cm MITRAL VALVE                TRICUSPID VALVE MV Area (PHT): 8.52 cm     TR Peak grad:   28.5 mmHg MV Decel Time: 89 msec      TR Vmax:        267.00 cm/s MV E velocity: 124.00 cm/s                             SHUNTS                             Systemic VTI:  0.18 m                             Systemic Diam: 1.80 cm Soyla Merck MD Electronically signed by Soyla Merck MD Signature Date/Time: 04/15/2024/10:02:39 AM    Final    US  Paracentesis Result Date: 04/12/2024 INDICATION: Patient with history of pancreatic cancer with recurrent malignant ascites; request received for diagnostic and therapeutic paracentesis. EXAM: ULTRASOUND GUIDED DIAGNOSTIC AND THERAPEUTIC PARACENTESIS MEDICATIONS: 8 mL 1% lidocaine  with epinephrine  to skin/subcutaneous tissue COMPLICATIONS: None immediate. PROCEDURE: Informed written consent was obtained from the patient after a discussion of the risks, benefits and alternatives to treatment. A timeout was performed prior to the initiation of the procedure. Initial ultrasound scanning demonstrates a large amount of ascites within the right lower abdominal quadrant. The right lower abdomen was prepped and draped in the usual sterile fashion. 1% lidocaine  with epinephrine  was used for local anesthesia. Following this, a 19 gauge, 10-cm, Yueh catheter was introduced. An ultrasound image was saved for documentation purposes. The paracentesis was performed. The catheter was removed and a dressing was applied. The patient  tolerated the procedure well without immediate post procedural complication. FINDINGS: A total of approximately 4.2 liters of slightly hazy, yellow fluid was removed. Samples were sent to the laboratory as requested by the clinical team. IMPRESSION: Successful ultrasound-guided diagnostic and therapeutic paracentesis yielding 4.2 liters of peritoneal fluid. Performed by: Franky Rakers, PA-C Electronically Signed   By: JONETTA Faes M.D.   On: 04/12/2024 00:13   CT ABDOMEN PELVIS W CONTRAST Result Date: 04/11/2024 EXAM: CT ABDOMEN AND PELVIS WITH CONTRAST 04/11/2024 11:35:03 AM TECHNIQUE: CT of the abdomen and pelvis was performed with the administration of 100 mL of iohexol  (OMNIPAQUE ) 300 MG/ML solution. Multiplanar reformatted images are provided for review. Automated exposure control, iterative reconstruction, and/or weight-based adjustment of the mA/kV was utilized to reduce the radiation dose to as low as reasonably achievable. COMPARISON: 03/15/2024 CLINICAL HISTORY: Abdominal pain, acute, nonlocalized. FINDINGS: LOWER CHEST: No acute abnormality. LIVER: Multiple rounded low densities are noted in the posterior segment of the right hepatic lobe which were not significantly changed compared to prior exam consistent with metastatic disease. Stable left hepatic lobe density is also noted concerning for metastatic disease. Left hepatic pneumobilia is again noted. GALLBLADDER AND BILE DUCTS: Gallbladder is unremarkable. No biliary ductal dilatation. SPLEEN: Mild splenomegaly is noted. PANCREAS: Continued presence of infiltrative pancreatic head mass superior mesenteric and portal vein and probably celiac axis as noted on prior exam. ADRENAL GLANDS: No acute abnormality. KIDNEYS, URETERS AND BLADDER: No stones in the kidneys or ureters. No hydronephrosis. No perinephric or periureteral stranding.  Urinary bladder is unremarkable. GI AND BOWEL: Stomach demonstrates no acute abnormality. Wall thickening of the right  and transverse colon is noted which may represent edema or possibly infectious or inflammatory colitis. There is no bowel obstruction. PERITONEUM AND RETROPERITONEUM: Moderate ascites is noted. No free air. VASCULATURE: Aorta is normal in caliber. LYMPH NODES: No lymphadenopathy. REPRODUCTIVE ORGANS: No acute abnormality. BONES AND SOFT TISSUES: No acute osseous abnormality. No focal soft tissue abnormality. IMPRESSION: 1. Stable hepatic metastases in the right posterior segment and left hepatic lobe density consistent with metastasis. 2. Infiltrative pancreatic head mass with involvement of the superior mesenteric vein, portal vein, and probable celiac axis, unchanged. 3. Right and transverse colonic wall thickening, which may represent edema or infectious/inflammatory colitis. 4. Mild splenomegaly and moderate ascites. Electronically signed by: Lynwood Seip MD 04/11/2024 12:05 PM EST RP Workstation: [id number]   IR Paracentesis Result Date: 03/29/2024 INDICATION: Patient with a history of pancreatic cancer with recurrent ascites. Interventional Radiology asked to perform a therapeutic paracentesis with 4 L max EXAM: ULTRASOUND GUIDED PARACENTESIS MEDICATIONS: 1% lidocaine  10 ml COMPLICATIONS: None immediate. PROCEDURE: Informed written consent was obtained from the patient after a discussion of the risks, benefits and alternatives to treatment. A timeout was performed prior to the initiation of the procedure. Initial ultrasound scanning demonstrates a large amount of ascites within the left lower abdominal quadrant. The left lower abdomen was prepped and draped in the usual sterile fashion. 1% lidocaine  was used for local anesthesia. Following this, a 19 gauge, 7-cm, Yueh catheter was introduced. An ultrasound image was saved for documentation purposes. The paracentesis was performed. The catheter was removed and a dressing was applied. The patient tolerated the procedure well without immediate post procedural  complication. Patient received post-procedure intravenous albumin ; see nursing notes for details. FINDINGS: A total of approximately 4 L of clear yellow fluid was removed. IMPRESSION: Successful ultrasound-guided paracentesis yielding 4 liters of peritoneal fluid. Procedure performed by Warren Dais, NP Electronically Signed   By: Ester Sides M.D.   On: 03/29/2024 09:52   Addendum I have seen the patient, examined her. I agree with the assessment and and plan and have edited the notes.   Patient has been intubated since April 17, 2024.  I spoke with her husband at the bedside.  I reviewed her chart.  Unfortunately she has multiple organ failures, with sepsis from bacteremia and fungemia, severe thrombocytosis, AKI, and respite failure on life support.  Due to her metastatic pancreatic cancer to liver, her prognosis is extremely poor.  I discussed her goal of care with her husband, which is palliative, and encouraged her husband to start thinking about care withdrawal and hospice, given the guarded prognosis.  I encouraged him to talk to the rest of family in the next few days.  I will follow-up in 2 days.  I will also reach out to the her primary oncologist Dr. Donato at [facility].  Onita Mattock MD 04/24/2024

## 2024-04-24 NOTE — Progress Notes (Signed)
  Progress Note   Date: 04/23/2024  Patient Name: Nicole Johnson        MRN#: [id number]  Clarification of diagnosis:  Moderate malnutrition present on admission

## 2024-04-24 NOTE — Progress Notes (Signed)
 Daily Progress Note   Patient Name: Nicole Johnson       Date: 04/24/2024 DOB: 12-05-1960  Age: 63 y.o. MRN#: [id number] Attending Physician: Nicole Jennet DELENA, MD Primary Care Physician: Tower, Nicole DELENA, MD Admit Date: 04/11/2024 Length of Stay: 12 days  Reason for Consultation/Follow-up: Establishing goals of care  Subjective:   Reviewed EMR including recent documentation from PCCM, infectious disease, and oncology.  Patient continuing to receive antibiotics for fungemia and bacteremia.  Patient continues to receive daily weaning trials as per PCCM.  Personally reviewed recent CT abdomen/pelvis on 04/23/2024 which noted: Pancreatic head mass with encasement of the SMV and severe mass effect effacing the portal vein; slight interval increase in liver metastases in the right lobe; small new pericardial effusion and bilateral symmetric small layering pleural effusions; right greater than left opacities concerning for pneumonia and/or edema; new thin-walled air cyst in dorsal lingular base concerning for cavitary septic embolus; thickened small bowel folds likely concerning for congestive wall edema or alternatively enteritis.  Personally reviewed recent CBC noting creatinine elevated to 1.74 and BUN elevated to 72 so estimated GFR of 33.  Reviewed CBC noting patient's platelets low at 25.  Discussed care with PCCM providers for medical updates.  PCCM providers have reached out to oncology team.  Patient was followed by [facility] oncology prior to presentation. Discussed care with bedside RN for medical updates.  Presented to bedside to see patient.  Patient laying intubated in bed.  Will occasionally open eyes at times though not following commands.  Able to meet patient's husband and his sister at bedside.  Introduced myself as a member of the palliative medicine team and that I would be coming on service for this week.  Inquired about updates regarding plans of care for today.  Has been noted that patient  had midline placed and will have central line removed to hopefully help with line holiday.  Husband noted that patient was able to tolerate spontaneous breathing trial for an hour or 2 yesterday which was very encouraging to him.  Husband noted that he just wanted patient to get off the ventilator though discussed worry that team did not want to remove too quickly as did not want to have have to put it right back in and cause patient more harm.  Husband acknowledged this.  Has been remains hopeful for patient's overall improvement.  As introducing myself today, noted palliative medicine team would continue to follow along with patient's medical journey to support as able regarding conversations moving forward.  Thanked them for allowing me to visit with them today.  Objective:   Vital Signs:  BP (!) 140/67   Pulse 80   Temp 98.3 F (36.8 C) (Axillary)   Resp (!) 22   Ht 5' 5 (1.651 m)   Wt 78 kg   LMP 01/11/2011   SpO2 100%   BMI 28.62 kg/m   Physical Exam: General: Ill-appearing, intubated on ventilator support, frail Cardiovascular: RRR Respiratory: Intubated on ventilator support Abdomen: distended Skin: Multiple ecchymoses on her upper extremities bilaterally Neuro: Opening eyes randomly at times though not following commands  Assessment & Plan:   Assessment: Patient is a 62 year old female with a past medical history of metastatic pancreatic adenocarcinoma to liver, pancytopenia, cirrhosis with ascites, and GERD who was admitted on 04/11/2024 for management of abdominal pain likely due to large ascites.  Hospital course has been complicated by SVT; shock likely contributed to by cardiogenic shock and septic shock in setting of  fungemia and bacteremia; acute hypoxic respiratory failure; AKI, thrombocytopenia.  Palliative medicine team consulted to assist with complex medical decision making.  Recommendations/Plan: # Complex medical decision making/goals of care:  - Patient unable  to participate with complex medical decision making due to underlying medical status.  - Discussed care with patient's husband at bedside.  At this time continuing aggressive medical interventions with full scope of care.  Patient remains full code.  Palliative medicine team continuing to engage in conversations as able and appropriate moving forward.  -  Code Status: Full Code  # Discharge Planning: To Be Determined  Discussed with: PCCM providers, bedside RN, patient's husband and sister  Thank you for allowing the palliative care team to participate in the care Nicole Johnson.  Tinnie Radar, DO Palliative Care Provider PMT # [phone]  If patient remains symptomatic despite maximum doses, please call PMT at [phone] between 0700 and 1900. Outside of these hours, please call attending, as PMT does not have night coverage.  Billing based on MDM: High  Problems Addressed: One or more chronic illnesses with severe exacerbation, progression, or side effects of treatment.  Amount and/or Complexity of Data: Category 1:Review of prior external note(s) from each unique source, Review of the result(s) of each unique test, and Assessment requiring an independent historian(s), Category 2:Independent interpretation of a test performed by another physician/other qualified health care professional (not separately reported), and Category 3:Discussion of management or test interpretation with external physician/other qualified health care professional/appropriate source (not separately reported)

## 2024-04-24 NOTE — Progress Notes (Signed)
 RT went to do vent check, patient was found on PS/CPAP; RT was unaware of SBT and time patient was placed on SBT. Currently patient is tolerating at this time.

## 2024-04-24 NOTE — Progress Notes (Signed)
 Patient was flipped to wean setting on the vent at 1020. Patient was flipped back to [facility] at 1240 due to the patient being agitated & RR sustaining in the 40s. Patient given versed  2mg  see MAR. Patient stable at this time

## 2024-04-24 NOTE — Progress Notes (Signed)
 RT checking patient on SBT, and patient was back on the mode PRVC. Unknown how long patient weaned today.

## 2024-04-24 NOTE — Progress Notes (Signed)
   Progress Note   Date: 04/23/2024  Patient Name: Nicole Johnson        MRN#: [id number]   Clarification of the diagnosis of pressure ulcer(s):   Bilateral heel deep tissue pressure injuries, present on admission

## 2024-04-24 NOTE — Progress Notes (Signed)
 `  NAME:  Nicole Johnson, MRN:  [id number], DOB:  [birth year], LOS: 12 ADMISSION DATE:  04/11/2024, CONSULTATION DATE:  04/24/24 REFERRING MD:  Dr. Earley, CHIEF COMPLAINT:  A. Fib with RVR.   History of Present Illness:  63 y.o. female with medical history significant of metastatic pancreatic adenocarcinoma to liver, pancytopenia, cirrhosis with ascites, GERD, presented with worsening of abdominal pain likely due to large ascites (LVP on 11/19, drained 4.2 L, improved abdominal pain)  with hospital course c/b SVT requiring hemodynamic monitoring in ICU.  HPI: Patient had her routine chemotherapy last Wednesday and she has been feeling fine afterwards.  Friday, patient and her husband ate a roasted chicken for lunch and both became sick in the evening, their symptoms including abdominal pain nauseous vomiting, they attributed to possible food toxin from the chicken.  Husband however recovered the next day and the patient on the other hand continue to feel sick, she had 1 day of diarrhea and continued to have nauseous and vomiting and decreased appetite for last 3 to 4 days, she could only tolerate some Ensure, and became very dehydrated.  She denied any hematemesis or hematochezia.  Her abdominal pain mainly located on lower abdomen bilateral, constant since yesterday.  She denied any fever or chills.  She told me that she is scheduled to have platelet transfusion at [facility] today.   ED Course: Afebrile, borderline tachycardia blood pressure 92/60, O2 saturation 100% on room air.  CT abdomen pelvis showed no acute findings, moderate ascites.  Blood work showed INR 1.6, sodium 128 BUN 39 creatinine 1.0 glucose 132 albumin  2.3, WBC 9.8 hemoglobin 8.0 compared to baseline 8.2-9.6, platelet 25.   Oncology summary per [facility] Note 04/04/24: Initially treated for LAPC with FOLFOX with addition of GCSF, but c/b TCP so FOLFOX stopped and now s/p chemoradiation completed 05/17/23 with metastatic recurrence  12/2023. 04/04/24 resume gemcitabine and add reduce dose nab-paclitaxel, hold off on home administration of GCSF.  Pertinent  Medical History   Past Medical History:  Diagnosis Date   Arthritis    Cancer (HCC)    pancreatic   GERD (gastroesophageal reflux disease)    PONV (postoperative nausea and vomiting)    Prediabetes    Significant Hospital Events: Including procedures, antibiotic start and stop dates in addition to other pertinent events   11/19 --> admitted, large volume paracentesis 4.2 L drained 11/22 --> SVT, given adenosine  6 mg and 12 mg, did not abort, came down to ICU for DCCV. Given amio bolus and infusion, came out of a. Fib into NSR. 11/23 --> overnight developed another episode of a. Fib with RVR, given another bolus of amio, back to NSR. Hgb down to 6.8, 1 unit of PRBCs ordered. 11/24 rebolus Amio for RVR , albumin  given 11/25 2.5 L paracentesis after Cherina transfusion, worsening shock 11/25 ETT >> 11/26 blood cultures showing Klebsiella and Enterococcus >> abx to cefepime  plus ampicillin  11/27 Amio to oral 11/28 platelet transfusion, Port-A-Cath DC'd, left IJ placed 11/30 twitching of head and extremities, EEG obtained and negative   Interim History / Subjective:  NAEON No sedation, not weaning today yet due to low effort  +10.5L net since admit with diffuse ansarca  Objective    Blood pressure (!) 140/67, pulse 80, temperature 98.3 F (36.8 C), temperature source Axillary, resp. rate (!) 22, height 5' 5 (1.651 m), weight 78 kg, last menstrual period 01/11/2011, SpO2 100%.    Vent Mode: PRVC FiO2 (%):  [30 %] 30 %  Set Rate:  [18 bmp] 18 bmp Vt Set:  [450 mL] 450 mL PEEP:  [5 cmH20] 5 cmH20 Plateau Pressure:  [14 cmH20-17 cmH20] 15 cmH20   Intake/Output Summary (Last 24 hours) at 04/24/2024 0928 Last data filed at 04/24/2024 0650 Gross per 24 hour  Intake 1166.65 ml  Output 2200 ml  Net -1033.35 ml   Filed Weights   04/22/24 0500 04/23/24 0500  04/24/24 0439  Weight: 80 kg 79.8 kg 78 kg    Examination: General: Chronically ill appearing female, critically ill. Neuro: Somnolent despite no sedation. HEENT: [place]/AT. Sclerae anicteric. MMM. Cardiovascular: RRR, no M/R/G.  Lungs: Respirations even and unlabored.  CTA bilaterally, No W/R/R. Abdomen: BS x 4, soft, NT/ND.  Musculoskeletal: No gross deformities, 3+ edema to BUE and BLE.   Assessment and Plan   Sepsis from peritonitis with 1610 leukocytes, candida glabrata fungemia, enterococcus and klebsiella bacteremia. Shock physiology now resolved and she is off stress dose steroids - ID following, appreciate the assistance - Continue antibiotics: cefepime , ampicillin , micafungin - If survives, will need 2 weeks of IV antibiotics AFTER line holiday per ID recs (note, has not had true line holiday, had port removed but then had line placed that same day) - Depending on above, will need to be seen by ophthalmology to r/u fungal endophthalmitis - Continue midodrine   Atrial fibrillation with RVR - resolved, now off amio - Continue supportive care - Low Chads2VASc score, thrombocytopenia precludes anticoagulation  Acute respiratory failure with hypoxia, s/p intubation 11/25 - Continue daily weaning efforts as able, mental status has been barrier thus far - Bronchial hygiene  AKI Hypokalemia Volume overload - +10L net - Albumin  + Lasix  today, assess response and redose accordingly - Replete K - Follow BMP  Pancreatic cancer with metastases - per last note from [facility] 04/04/24, Initially treated for LAPC with FOLFOX with addition of GCSF, but c/b TCP so FOLFOX stopped and now s/p chemoradiation completed 05/17/23 with metastatic recurrence 12/2023. 04/04/24 resume gemcitabine and add reduce dose nab-paclitaxel, hold off on home administration of GCSF. Malignant ascites - s/p 11/6, 11/20, 11/25 ? New liver mets - MRI liver from 03/17/24 at our system noted for new lesion in segment  4a - Continue supportive care - Palliative care following, appreciate the assistance - Needs ongoing goals of care conversations - Will notify Dr. Lanny of admission, given that she was seeing pt prior to her going to [facility], family might be more open to conversations with a familiar face that knows pts history etc.  Severe thrombocytopenia - s/p platelets 11/25, 11/28 Chronic anemia - Transfuse platelets for goal greater than 10 or 50K for procedures - Hemoglobin transfusion greater than 7 per protocol   CC time: 40 min.   Sammi Gore, PA - [facility] For pager details, please see AMION or use Epic chat  After 1900, please call [facility] for cross coverage needs 04/24/2024, 9:28 AM

## 2024-04-24 NOTE — Progress Notes (Signed)

## 2024-04-24 NOTE — Plan of Care (Signed)
  Problem: Clinical Measurements: Goal: Ability to maintain clinical measurements within normal limits will improve Outcome: Progressing Goal: Will remain free from infection Outcome: Progressing Goal: Diagnostic test results will improve Outcome: Progressing Goal: Respiratory complications will improve Outcome: Progressing   Problem: Activity: Goal: Risk for activity intolerance will decrease Outcome: Progressing   Problem: Coping: Goal: Level of anxiety will decrease Outcome: Progressing   Problem: Elimination: Goal: Will not experience complications related to bowel motility Outcome: Progressing Goal: Will not experience complications related to urinary retention Outcome: Progressing   Problem: Pain Managment: Goal: General experience of comfort will improve and/or be controlled Outcome: Progressing   Problem: Safety: Goal: Ability to remain free from injury will improve Outcome: Progressing   Problem: Skin Integrity: Goal: Risk for impaired skin integrity will decrease Outcome: Progressing   Problem: Coping: Goal: Ability to adjust to condition or change in health will improve Outcome: Progressing   Problem: Fluid Volume: Goal: Ability to maintain a balanced intake and output will improve Outcome: Progressing   Problem: Metabolic: Goal: Ability to maintain appropriate glucose levels will improve Outcome: Progressing   Problem: Skin Integrity: Goal: Risk for impaired skin integrity will decrease Outcome: Progressing   Problem: Tissue Perfusion: Goal: Adequacy of tissue perfusion will improve Outcome: Progressing   Problem: Activity: Goal: Ability to tolerate increased activity will improve Outcome: Progressing   Problem: Respiratory: Goal: Ability to maintain a clear airway and adequate ventilation will improve Outcome: Progressing   Problem: Role Relationship: Goal: Method of communication will improve Outcome: Progressing   Problem:  Education: Goal: Knowledge of General Education information will improve Description: Including pain rating scale, medication(s)/side effects and non-pharmacologic comfort measures Outcome: Not Progressing   Problem: Health Behavior/Discharge Planning: Goal: Ability to manage health-related needs will improve Outcome: Not Progressing   Problem: Clinical Measurements: Goal: Cardiovascular complication will be avoided Outcome: Not Progressing   Problem: Nutrition: Goal: Adequate nutrition will be maintained Outcome: Not Progressing   Problem: Education: Goal: Ability to describe self-care measures that may prevent or decrease complications (Diabetes Survival Skills Education) will improve Outcome: Not Progressing Goal: Individualized Educational Video(s) Outcome: Not Progressing   Problem: Health Behavior/Discharge Planning: Goal: Ability to identify and utilize available resources and services will improve Outcome: Not Progressing Goal: Ability to manage health-related needs will improve Outcome: Not Progressing   Problem: Nutritional: Goal: Maintenance of adequate nutrition will improve Outcome: Not Progressing Goal: Progress toward achieving an optimal weight will improve Outcome: Not Progressing

## 2024-04-25 DIAGNOSIS — C787 Secondary malignant neoplasm of liver and intrahepatic bile duct: Secondary | ICD-10-CM

## 2024-04-25 DIAGNOSIS — R4589 Other symptoms and signs involving emotional state: Secondary | ICD-10-CM

## 2024-04-25 DIAGNOSIS — N179 Acute kidney failure, unspecified: Secondary | ICD-10-CM

## 2024-04-25 LAB — BASIC METABOLIC PANEL WITH GFR
Anion gap: 14 (ref 5–15)
BUN: 75 mg/dL — ABNORMAL HIGH (ref 8–23)
CO2: 22 mmol/L (ref 22–32)
Calcium: 8.2 mg/dL — ABNORMAL LOW (ref 8.9–10.3)
Chloride: 110 mmol/L (ref 98–111)
Creatinine, Ser: 1.86 mg/dL — ABNORMAL HIGH (ref 0.44–1.00)
GFR, Estimated: 30 mL/min — ABNORMAL LOW
Glucose, Bld: 109 mg/dL — ABNORMAL HIGH (ref 70–99)
Potassium: 2.6 mmol/L — CL (ref 3.5–5.1)
Sodium: 146 mmol/L — ABNORMAL HIGH (ref 135–145)

## 2024-04-25 LAB — GLUCOSE, CAPILLARY
Glucose-Capillary: 108 mg/dL — ABNORMAL HIGH (ref 70–99)
Glucose-Capillary: 104 mg/dL — ABNORMAL HIGH (ref 70–99)
Glucose-Capillary: 117 mg/dL — ABNORMAL HIGH (ref 70–99)
Glucose-Capillary: 119 mg/dL — ABNORMAL HIGH (ref 70–99)
Glucose-Capillary: 109 mg/dL — ABNORMAL HIGH (ref 70–99)
Glucose-Capillary: 111 mg/dL — ABNORMAL HIGH (ref 70–99)

## 2024-04-25 LAB — CBC
HCT: 23.8 % — ABNORMAL LOW (ref 36.0–46.0)
Hemoglobin: 7.8 g/dL — ABNORMAL LOW (ref 12.0–15.0)
MCH: 29 pg (ref 26.0–34.0)
MCHC: 32.8 g/dL (ref 30.0–36.0)
MCV: 88.5 fL (ref 80.0–100.0)
Platelets: 33 10*3/uL — ABNORMAL LOW (ref 150–400)
RBC: 2.69 MIL/uL — ABNORMAL LOW (ref 3.87–5.11)
RDW: 18.2 % — ABNORMAL HIGH (ref 11.5–15.5)
WBC: 8.2 K/uL (ref 4.0–10.5)
nRBC: 0 % (ref 0.0–0.2)

## 2024-04-25 LAB — MAGNESIUM: Magnesium: 3.2 mg/dL — ABNORMAL HIGH (ref 1.7–2.4)

## 2024-04-25 LAB — PHOSPHORUS: Phosphorus: 4.7 mg/dL — ABNORMAL HIGH (ref 2.5–4.6)

## 2024-04-25 LAB — POTASSIUM: Potassium: 3.5 mmol/L (ref 3.5–5.1)

## 2024-04-25 MED ORDER — FUROSEMIDE 10 MG/ML IJ SOLN: 40.0000 mg | Freq: Two times a day (BID) | INTRAMUSCULAR | Status: DC

## 2024-04-25 MED ORDER — ALBUMIN HUMAN 25 % IV SOLN: 25.0000 g | Freq: Two times a day (BID) | INTRAVENOUS | Status: DC

## 2024-04-25 MED ORDER — ALBUMIN HUMAN 25 % IV SOLN
25.0000 g | Freq: Two times a day (BID) | INTRAVENOUS | Status: AC
  Administered 2024-04-25 (×2): 25 g via INTRAVENOUS
  Filled 2024-04-25 (×2): qty 100

## 2024-04-25 MED ORDER — DEXMEDETOMIDINE HCL IN NACL 200 MCG/50ML IV SOLN
0.0000 ug/kg/h | INTRAVENOUS | Status: DC
  Administered 2024-04-25 – 2024-04-26 (×4): 0.4 ug/kg/h via INTRAVENOUS
  Administered 2024-04-26: 0.2 ug/kg/h via INTRAVENOUS
  Filled 2024-04-25 (×5): qty 50

## 2024-04-25 MED ORDER — POTASSIUM CHLORIDE 20 MEQ PO PACK
40.0000 meq | PACK | Freq: Three times a day (TID) | ORAL | Status: AC
  Administered 2024-04-25 (×3): 40 meq
  Filled 2024-04-25 (×3): qty 2

## 2024-04-25 MED ORDER — POTASSIUM CHLORIDE 10 MEQ/50ML IV SOLN
10.0000 meq | INTRAVENOUS | Status: AC
  Administered 2024-04-25 (×6): 10 meq via INTRAVENOUS
  Filled 2024-04-25 (×6): qty 50

## 2024-04-25 MED ORDER — FUROSEMIDE 10 MG/ML IJ SOLN
40.0000 mg | Freq: Two times a day (BID) | INTRAMUSCULAR | Status: AC
  Administered 2024-04-25 (×2): 40 mg via INTRAVENOUS
  Filled 2024-04-25 (×2): qty 4

## 2024-04-25 NOTE — Progress Notes (Signed)
 `  NAME:  Nicole Johnson, MRN:  [id number], DOB:  [birth year], LOS: 13 ADMISSION DATE:  04/11/2024, CONSULTATION DATE:  04/25/24 REFERRING MD:  Dr. Earley, CHIEF COMPLAINT:  A. Fib with RVR.   History of Present Illness:  63 y.o. female with medical history significant of metastatic pancreatic adenocarcinoma to liver, pancytopenia, cirrhosis with ascites, GERD, presented with worsening of abdominal pain likely due to large ascites (LVP on 11/19, drained 4.2 L, improved abdominal pain)  with hospital course c/b SVT requiring hemodynamic monitoring in ICU.  HPI: Patient had her routine chemotherapy last Wednesday and she has been feeling fine afterwards.  Friday, patient and her husband ate a roasted chicken for lunch and both became sick in the evening, their symptoms including abdominal pain nauseous vomiting, they attributed to possible food toxin from the chicken.  Husband however recovered the next day and the patient on the other hand continue to feel sick, she had 1 day of diarrhea and continued to have nauseous and vomiting and decreased appetite for last 3 to 4 days, she could only tolerate some Ensure, and became very dehydrated.  She denied any hematemesis or hematochezia.  Her abdominal pain mainly located on lower abdomen bilateral, constant since yesterday.  She denied any fever or chills.  She told me that she is scheduled to have platelet transfusion at [facility] cancer center today.   ED Course: Afebrile, borderline tachycardia blood pressure 92/60, O2 saturation 100% on room air.  CT abdomen pelvis showed no acute findings, moderate ascites.  Blood work showed INR 1.6, sodium 128 BUN 39 creatinine 1.0 glucose 132 albumin  2.3, WBC 9.8 hemoglobin 8.0 compared to baseline 8.2-9.6, platelet 25.   Oncology summary per [facility] Note 04/04/24: Initially treated for LAPC with FOLFOX with addition of GCSF, but c/b TCP so FOLFOX stopped and now s/p chemoradiation completed 05/17/23 with metastatic recurrence  12/2023. 04/04/24 resume gemcitabine and add reduce dose nab-paclitaxel, hold off on home administration of GCSF.  Pertinent  Medical History   Past Medical History:  Diagnosis Date   Arthritis    Cancer (HCC)    pancreatic   GERD (gastroesophageal reflux disease)    PONV (postoperative nausea and vomiting)    Prediabetes    Significant Hospital Events: Including procedures, antibiotic start and stop dates in addition to other pertinent events   11/19 --> admitted, large volume paracentesis 4.2 L drained 11/22 --> SVT, given adenosine  6 mg and 12 mg, did not abort, came down to ICU for DCCV. Given amio bolus and infusion, came out of a. Fib into NSR. 11/23 --> overnight developed another episode of a. Fib with RVR, given another bolus of amio, back to NSR. Hgb down to 6.8, 1 unit of PRBCs ordered. 11/24 rebolus Amio for RVR , albumin  given 11/25 2.5 L paracentesis after Cherina transfusion, worsening shock 11/25 ETT >> 11/26 blood cultures showing Klebsiella and Enterococcus >> abx to cefepime  plus ampicillin  11/27 Amio to oral 11/28 platelet transfusion, Port-A-Cath DC'd, left IJ placed 11/30 twitching of head and extremities, EEG obtained and negative 12/2 oncology consulted   Interim History / Subjective:  Some coughing/agitation overnight requiring versed . This AM, opens eyes but doesn't follow any commands. Good response to lasix  yesterday with over 1L UOP. Still +8 net with diffuse anasarca though.  Objective    Blood pressure (!) 148/68, pulse 81, temperature 100.1 F (37.8 C), temperature source Axillary, resp. rate 19, height 5' 5 (1.651 m), weight 77 kg, last menstrual period 01/11/2011, SpO2  100%.    Vent Mode: PRVC FiO2 (%):  [30 %] 30 % Set Rate:  [18 bmp] 18 bmp Vt Set:  [450 mL] 450 mL PEEP:  [5 cmH20] 5 cmH20 Pressure Support:  [6 cmH20] 6 cmH20 Plateau Pressure:  [13 cmH20-16 cmH20] 15 cmH20   Intake/Output Summary (Last 24 hours) at 04/25/2024  0747 Last data filed at 04/25/2024 0700 Gross per 24 hour  Intake 716.39 ml  Output 2825 ml  Net -2108.61 ml   Filed Weights   04/23/24 0500 04/24/24 0439 04/25/24 0500  Weight: 79.8 kg 78 kg 77 kg    Examination: General: Chronically ill appearing female, critically ill. Neuro: Somnolent despite no sedation. Opens eyes to voice but doesn't follow commands. HEENT: [place]/AT. Sclerae anicteric. MMM. Cardiovascular: RRR, no M/R/G.  Lungs: Respirations even and unlabored.  CTA bilaterally, No W/R/R. Abdomen: BS x 4, soft, NT/ND.  Musculoskeletal: No gross deformities, 3+ edema to BUE and BLE.   Assessment and Plan   Sepsis from peritonitis with 1610 leukocytes, candida glabrata fungemia, enterococcus and klebsiella bacteremia. Shock physiology now resolved and she is off stress dose steroids - ID following, appreciate the assistance - Continue antibiotics: cefepime , ampicillin , micafungin  - If survives, will need 2 weeks of IV antibiotics AFTER line holiday per ID recs (note, has not had true line holiday, had port removed but then had line placed that same day) - Depending on above, will need to be seen by ophthalmology to r/u fungal endophthalmitis - Continue midodrine   Atrial fibrillation with RVR - resolved, now off amio and NSR. - Continue supportive care - Low Chads2VASc score, thrombocytopenia precludes anticoagulation  Acute respiratory failure with hypoxia, s/p intubation 11/25 - Continue daily weaning efforts as able, mental status has been barrier thus far though she did wean around 2 hours 12/2. Will reattempt today as husband also eager to try get her extubated - Bronchial hygiene - Discussed with husband at length regarding challenges with weaning thus far and that trach would not be in her best interest or provide meaningful long term benefit  AKI Hypokalemia Volume overload - +8L net. Good response to Lasix  - Repeat Albumin  + Lasix  today - Replete K - Follow  BMP  Pancreatic cancer with metastases - per last note from [facility] 04/04/24, Initially treated for LAPC with FOLFOX with addition of GCSF, but c/b TCP so FOLFOX stopped and now s/p chemoradiation completed 05/17/23 with metastatic recurrence 12/2023. 04/04/24 resume gemcitabine and add reduce dose nab-paclitaxel, hold off on home administration of GCSF. Malignant ascites - s/p 11/6, 11/20, 11/25 ? New liver mets - MRI liver from 03/17/24 at our system noted for new lesion in segment 4a - Continue supportive care - Palliative care following, appreciate the assistance - Needs ongoing goals of care conversations - Dr. Lanny consulted 12/2, appreciate the assistance. She has recommended palliative/hospice to family and will continue to follow along.  Severe thrombocytopenia - s/p platelets 11/25, 11/28 Chronic anemia - Transfuse platelets for goal greater than 10 or 50K for procedures - Hemoglobin transfusion greater than 7 per protocol  Intermittent agitation/delirium. - Will try low dose Precedex  in hopes of avoiding benzos overnight etc. Try to get her more awake to help with vent weaning.   CC time: 30 min.   Sammi Gore, PA - [facility] For pager details, please see AMION or use Epic chat  After 1900, please call [facility] for cross coverage needs 04/25/2024, 7:47 AM

## 2024-04-25 NOTE — Progress Notes (Addendum)
 Daily Progress Note   Patient Name: Nicole Johnson       Date: 04/25/2024 DOB: 05-18-1961  Age: 63 y.o. MRN#: [id number] Attending Physician: Neda Jennet DELENA, MD Primary Care Physician: Tower, Laine DELENA, MD Admit Date: 04/11/2024 Length of Stay: 13 days  Reason for Consultation/Follow-up: Establishing goals of care  Subjective:   Reviewed EMR including recent documentation from [facility] providers and oncology.  Oncologist had visited with patient's husband on 04/24/2024 to discuss that patient has multiple organs failing was sepsis from bacteremia and fungemia, severe thrombocytosis, AKI, and requiring ventilator support all in the setting of her metastatic pancreatic cancer.  Oncologist encouraged consideration of focusing on comfort at this time. Discussed care with PCCM providers in person for medical updates.  Also discussed care with bedside RN for medical updates.  When initially presenting to bedside to meet with patient's husband, RN noted that he had left to go get the patient's and his son and would be returning later in the day.  Asked to police be informed when he was back at bedside to further discuss care.  Presented to bedside later in afternoon once informed husband was present.  Son present with husband as well.  Again introduced myself as a member of the palliative medicine team and my role in patient's medical journey.  Has been able to provide updates regarding patient's care today.  He noted that the PCCM team has emphasized needing to make a decision regarding what would happen when patient is extubated.  Acknowledged this.  With permission, spent extensive time with husband learning about patient's medical journey up into this point.  Learned about her cancer progression and all they had gone through prior to this admission.  Also spent time reviewing patient's current medical illnesses including lab work and imaging to make sure husband had all information regarding patient's  care at this point.  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  Acknowledged this.  Also spent time learning about who she was outside of the hospital.  Husband described her as a very independent lady who always want to be active.  They have been married for over 40 years.  They run a business together.  He described how even when patient got sick, she wanted to remain active and keep working on projects.  He noted the only time she would stop was when she was in severe pain.  He described all the burning she had to go through while suffering from pain from her cancer.  Empathized with difficult situation.  Husband appropriately tearful at times during sedation.  Was then able to revisit question currently posed to husband from the medical team about what happens when they extubate the patient.  Discussed possible pathways moving forward.  Noted that hope is patient would be able to sustain her respiratory support though very worried with her deconditioned state in the setting of her metastatic cancer, that would likely not happen.  Noted that should patient begin to deteriorate from a respiratory status after extubation, husband needs to consider if the patient who was previously very active person, would want to be reintubated and attached to machine until she dies from her cancer.  Also expressed great concern regarding risk about reintubation since patient's trachea likely already irritated from having ET tube in place for a long time and patient being at high risk of bleeding with low platelets. Offered that instead should patient be extubated and not do as well  as hoped, could make sure she is not suffering at end-of-life.  Husband had noted one concern was that patient be gasping for air after extubation.  Noted could provide medications for comfort and for management of shortness of breath and pain knowing that patient has incurable cancer and is reaching end-of-life.   Patient's husband even acknowledged during conversation that patient was likely not going to make it to Christmas as they had hoped.  Spent time providing emotional support as husband and son are processing patient acutely worsening to this position as they did not expect patient would not be going home with them.  Husband also trying to manage priorities outside of the hospital with financial aspects since patient normally performed all of these task.  Husband does find this a bit overwhelming though acknowledges that his priority focus is on the patient at this time.  Again empathized with very difficult situation.  Husband noted that he has asked patient's sister, Nicole Johnson, to come see the patient so that he can discuss this difficult decision with her as well.  Noted that the care team would be happy to speak to her as well to explain the situation as it is understandable that all of this medical information whelming proximal to him.  He acknowledged appreciation with these conversations moving forward.  All questions answered at that time.  Noted palliative medicine team will continue to follow along with patient's medical journey.  UPDATE: Asked to return to bedside in the evening as patient's sister, Nicole Johnson, now present with husband. Presented to bedside and able to discuss care with patient's husband, son, sister, and niece (who is an [employer]). Introduced myself as a member of the palliative medicine team. Spent time discussing what had been review with patient's son and husband earlier in the day. Discuss patient's multiple medical comorbidities and multisystem organ failure in the setting of her underlying metastatic cancer. Review imaging and lab work. Expressed concerns from the medical team related to patient's severity of illness.  Then discussed the choice husband is considering about how to proceed when patient is extubated. Discussed that all appropraite medical interventions are being performed to  try and optimize patient's chances though with patient's underlying illness, very concerned she would still deteriorate after extubation. Discussed should decision remain to reintubated her once extubated, concerned about risk this would be to her and that would essentially be placing her back on the ventilator support until she dies. She is not an appropraite candidate for a tracheostomy with her underlying illnesses. Husband describes patient as a very independent person so very worried if she would want to be on a machine until she dies. When also discussed alternative pathway that once patient is extubated, we hope she does well and also if she doesn't, would then focus on her comfort and manage symptoms at the end of life. Having her off the ventilator would be the best opportunity for family to interact with her at all though still slim she will be able to engage fully. Discussed that no matter the path chosen, would recommend family be present once timing of intubation planned in case things do not go well as hoped. Noted would be up to ICU team when they believe the best chance is for patient to have ventilator removed with their medical optimization. Would be discussed with family before ET was removed. Spent time answering questions as able. Family appropriately tearful with sister noting she didn't want her sister to suffer anymore. They are supportive  of whatever decisions husband makes. After answering questions and providing emotional support via active listening, encouraged they discuss together as a family what patient would want for herself and what fits their family. Noted PMT would continue to follow along and engage in conversations to assist with care planning. Thanked them for allowing me to visit and talk with them. Bedside RN present at beside during conversation as well.   Updated PCCM provider after conversation. At this time, continuing appropraite full medical measures and remains full  code.   Objective:   Vital Signs:  BP 135/67   Pulse 81   Temp 100.1 F (37.8 C) (Axillary)   Resp 19   Ht 5' 5 (1.651 m)   Wt 77 kg   LMP 01/11/2011   SpO2 100%   BMI 28.25 kg/m   Physical Exam: General: Ill-appearing, intubated on ventilator support, frail Cardiovascular: RRR Respiratory: Intubated on ventilator support Abdomen: distended Skin: Multiple ecchymoses on her upper extremities bilaterally Neuro: Randomly flexing feet bilaterally, not following commands  Assessment & Plan:   Assessment: Patient is a 63 year old female with a past medical history of metastatic pancreatic adenocarcinoma to liver, pancytopenia, cirrhosis with ascites, and GERD who was admitted on 04/11/2024 for management of abdominal pain likely due to large ascites.  Hospital course has been complicated by SVT; shock likely contributed to by cardiogenic shock and septic shock in setting of fungemia and bacteremia; acute hypoxic respiratory failure; AKI, thrombocytopenia.  Palliative medicine team consulted to assist with complex medical decision making.  Recommendations/Plan: # Complex medical decision making/goals of care:  - Patient unable to participate with complex medical decision making due to underlying medical status.  - Discussed care with patient's husband and son extensively at bedside as detailed above in HPI.  Husband acknowledges he has a difficult decision to make about what happens when patient is extubated as he does want to get patient extubated knowing it is causing her discomfort.  Talked about paths moving forward including reintubation if patient deteriorated and the concerns and risk that would cause to patient while also considering that she was such an independent person before all this, would she want to be tied to a machine until she dies from her cancer versus transitioning to comfort focused care should patient deteriorate after extubation.  Husband is considering this and  has also asked patient's sister, Nicole Johnson, to come visit to be involved in conversations and support decisions moving forward.   UPDATE: Asked to return to bedside to discuss with sister and her daughter (who is an [employer]) in the evening to provide the same update had given husband and son earlier in the day. They are considering as a family what patient would want regarding her care. Currently remains full medical interventions/full code. Noted palliative medicine team to continue to follow along with patient's medical journey.  -  Code Status: Full Code  # Discharge Planning: To Be Determined  Discussed with: PCCM providers, bedside RN, patient's husband/son/sister/niece  Thank you for allowing the palliative care team to participate in the care [name].  Tinnie Radar, DO Palliative Care Provider PMT # [phone]  If patient remains symptomatic despite maximum doses, please call PMT at [phone] between 0700 and 1900. Outside of these hours, please call attending, as PMT does not have night coverage.  Personally spent 95 minutes in patient care including extensive chart review (labs, imaging, progress/consult notes, vital signs), medically appropraite exam, discussed with treatment team, education to patient, family, and  staff, documenting clinical information, medication review and management, coordination of care, and available advanced directive documents.

## 2024-04-25 NOTE — Plan of Care (Signed)
  Problem: Clinical Measurements: Goal: Ability to maintain clinical measurements within normal limits will improve Outcome: Progressing Goal: Will remain free from infection Outcome: Progressing Goal: Respiratory complications will improve Outcome: Progressing Goal: Cardiovascular complication will be avoided Outcome: Progressing   Problem: Activity: Goal: Risk for activity intolerance will decrease Outcome: Progressing   Problem: Coping: Goal: Level of anxiety will decrease Outcome: Progressing   Problem: Elimination: Goal: Will not experience complications related to bowel motility Outcome: Progressing Goal: Will not experience complications related to urinary retention Outcome: Progressing   Problem: Pain Managment: Goal: General experience of comfort will improve and/or be controlled Outcome: Progressing   Problem: Safety: Goal: Ability to remain free from injury will improve Outcome: Progressing   Problem: Skin Integrity: Goal: Risk for impaired skin integrity will decrease Outcome: Progressing   Problem: Coping: Goal: Ability to adjust to condition or change in health will improve Outcome: Progressing   Problem: Fluid Volume: Goal: Ability to maintain a balanced intake and output will improve Outcome: Progressing   Problem: Metabolic: Goal: Ability to maintain appropriate glucose levels will improve Outcome: Progressing   Problem: Skin Integrity: Goal: Risk for impaired skin integrity will decrease Outcome: Progressing   Problem: Tissue Perfusion: Goal: Adequacy of tissue perfusion will improve Outcome: Progressing   Problem: Activity: Goal: Ability to tolerate increased activity will improve Outcome: Progressing   Problem: Respiratory: Goal: Ability to maintain a clear airway and adequate ventilation will improve Outcome: Progressing   Problem: Role Relationship: Goal: Method of communication will improve Outcome: Progressing   Problem:  Education: Goal: Knowledge of General Education information will improve Description: Including pain rating scale, medication(s)/side effects and non-pharmacologic comfort measures Outcome: Not Progressing   Problem: Health Behavior/Discharge Planning: Goal: Ability to manage health-related needs will improve Outcome: Not Progressing   Problem: Clinical Measurements: Goal: Diagnostic test results will improve Outcome: Not Progressing   Problem: Nutrition: Goal: Adequate nutrition will be maintained Outcome: Not Progressing   Problem: Education: Goal: Ability to describe self-care measures that may prevent or decrease complications (Diabetes Survival Skills Education) will improve Outcome: Not Progressing Goal: Individualized Educational Video(s) Outcome: Not Progressing   Problem: Health Behavior/Discharge Planning: Goal: Ability to identify and utilize available resources and services will improve Outcome: Not Progressing Goal: Ability to manage health-related needs will improve Outcome: Not Progressing   Problem: Nutritional: Goal: Maintenance of adequate nutrition will improve Outcome: Not Progressing Goal: Progress toward achieving an optimal weight will improve Outcome: Not Progressing

## 2024-04-25 NOTE — Progress Notes (Signed)
 eLink Physician-Brief Progress Note Patient Name: MATILYNN DACEY DOB: 24-May-1961 MRN: [id number]   Date of Service  04/25/2024  HPI/Events of Note  Temp 100.1, known polymicrobial infection with sepsis  eICU Interventions  No indication for antipyretics     Intervention Category Minor Interventions: Routine modifications to care plan (e.g. PRN medications for pain, fever)  Natika Geyer 04/25/2024, 5:31 AM

## 2024-04-26 DIAGNOSIS — A499 Bacterial infection, unspecified: Secondary | ICD-10-CM | POA: Diagnosis not present

## 2024-04-26 DIAGNOSIS — L8990 Pressure ulcer of unspecified site, unspecified stage: Secondary | ICD-10-CM | POA: Insufficient documentation

## 2024-04-26 DIAGNOSIS — Z66 Do not resuscitate: Secondary | ICD-10-CM

## 2024-04-26 LAB — CBC
HCT: 21.2 % — ABNORMAL LOW (ref 36.0–46.0)
Hemoglobin: 7 g/dL — ABNORMAL LOW (ref 12.0–15.0)
MCH: 28.8 pg (ref 26.0–34.0)
MCHC: 32.1 g/dL (ref 30.0–36.0)
MCV: 89.8 fL (ref 80.0–100.0)
Platelets: 33 K/uL — ABNORMAL LOW (ref 150–400)
RBC: 2.36 MIL/uL — ABNORMAL LOW (ref 3.87–5.11)
RDW: 18.2 % — ABNORMAL HIGH (ref 11.5–15.5)
WBC: 5.1 K/uL (ref 4.0–10.5)
nRBC: 0 % (ref 0.0–0.2)

## 2024-04-26 LAB — GLUCOSE, CAPILLARY
Glucose-Capillary: 113 mg/dL — ABNORMAL HIGH (ref 70–99)
Glucose-Capillary: 116 mg/dL — ABNORMAL HIGH (ref 70–99)
Glucose-Capillary: 122 mg/dL — ABNORMAL HIGH (ref 70–99)
Glucose-Capillary: 98 mg/dL (ref 70–99)
Glucose-Capillary: 106 mg/dL — ABNORMAL HIGH (ref 70–99)
Glucose-Capillary: 102 mg/dL — ABNORMAL HIGH (ref 70–99)

## 2024-04-26 LAB — BASIC METABOLIC PANEL WITH GFR
Anion gap: 14 (ref 5–15)
BUN: 78 mg/dL — ABNORMAL HIGH (ref 8–23)
CO2: 23 mmol/L (ref 22–32)
Calcium: 8.5 mg/dL — ABNORMAL LOW (ref 8.9–10.3)
Chloride: 112 mmol/L — ABNORMAL HIGH (ref 98–111)
Creatinine, Ser: 2.09 mg/dL — ABNORMAL HIGH (ref 0.44–1.00)
GFR, Estimated: 26 mL/min — ABNORMAL LOW (ref >60)
Glucose, Bld: 123 mg/dL — ABNORMAL HIGH (ref 70–99)
Potassium: 2.8 mmol/L — ABNORMAL LOW (ref 3.5–5.1)
Sodium: 148 mmol/L — ABNORMAL HIGH (ref 135–145)

## 2024-04-26 LAB — PHOSPHORUS: Phosphorus: 5.1 mg/dL — ABNORMAL HIGH (ref 2.5–4.6)

## 2024-04-26 LAB — MAGNESIUM: Magnesium: 2.9 mg/dL — ABNORMAL HIGH (ref 1.7–2.4)

## 2024-04-26 MED ORDER — SODIUM CHLORIDE 0.9 % IV SOLN
2.0000 g | Freq: Three times a day (TID) | INTRAVENOUS | Status: DC
  Administered 2024-04-26 – 2024-04-27 (×3): 2 g via INTRAVENOUS
  Filled 2024-04-26 (×4): qty 2000

## 2024-04-26 MED ORDER — SODIUM CHLORIDE 0.9 % IV SOLN
2.0000 g | INTRAVENOUS | Status: DC
  Administered 2024-04-27: 2 g via INTRAVENOUS
  Filled 2024-04-26: qty 12.5

## 2024-04-26 MED ORDER — HYDROMORPHONE HCL 1 MG/ML IJ SOLN: 0.2000 mg | INTRAMUSCULAR | Status: DC | PRN

## 2024-04-26 MED ORDER — POTASSIUM CHLORIDE 20 MEQ PO PACK
40.0000 meq | PACK | ORAL | Status: AC
  Administered 2024-04-26 (×2): 40 meq
  Filled 2024-04-26 (×2): qty 2

## 2024-04-26 MED ORDER — LORAZEPAM 2 MG/ML IJ SOLN: 0.2500 mg | INTRAMUSCULAR | Status: DC | PRN

## 2024-04-26 NOTE — Progress Notes (Signed)
 eLink Physician-Brief Progress Note Patient Name: Nicole Johnson DOB: 10/26/1960 MRN: [id number]   Date of Service  04/26/2024  HPI/Events of Note  septic shock with Klebsiella, fungemia, atrial fibrillation, respiratory failure, metastatic pancreatic cancer Potassium 2.8, Creatinine 2.09, Hgb 7.0   eICU Interventions  Kcl     Intervention Category Minor Interventions: Electrolytes abnormality - evaluation and management  Bayron Dalto 04/26/2024, 6:31 AM

## 2024-04-26 NOTE — Progress Notes (Signed)
 I spent time with Nicole Johnson's family to offer support.  They were at bedside after she had been extubated.  Their hope is that she will wake up and be able to communicate with them.  I provided listening as they shared about Nicole Johnson and her health and life.  We will continue to follow Nicole Johnson and her family, but please also page as needs arise.

## 2024-04-26 NOTE — Progress Notes (Signed)
 PCCM Brief Note  Extubated earlier this afternoon around 1315. Did well.  Per husband, she was initially awake and able to watch a Christmas movie with family at bedside for around 15 minutes before she got too sleepy and has since been resting comfortably with eyes closed, not interacting. Husband is concerned that she is too sleepy.  Currently on 2L [place] and remains on 0.4 Precedex .  Discussed with PMT (Dr. Clayton) as well as family. Given she has been stable since extubation and is now somnolent, will d/c Precedex  and change to Dilaudid  PRN and Ativan PRN.   I have ordered 0.2mg  Dilaudid  q1hr PRN for pain and air hunger as well as 0.25mg  Ativan q4hrs for anxiety.  Note: If she develops respiratory or other symptoms indicating that she is in distress, then transition to Dilaudid  infusion as part of transition to comfort measures. Would start Dilaudid  infusion 0.2mg /hr with as needed boluses of of 0.5mg  q15 minutes PRN.   Will sign out to [facility] for pickup tomorrow AM 12/5 with PCCM off.   Sammi Gore, PA - C  Pulmonary & Critical Care Medicine For pager details, please see AMION or use Epic chat  After 1900, please call [facility] for cross coverage needs 04/26/2024, 5:53 PM

## 2024-04-26 NOTE — Progress Notes (Signed)
 `  NAME:  DONNI OGLESBY, MRN:  [id number], DOB:  11-Jan-1961, LOS: 14 ADMISSION DATE:  04/11/2024, CONSULTATION DATE:  04/26/24 REFERRING MD:  Dr. Earley, CHIEF COMPLAINT:  A. Fib with RVR.   History of Present Illness:  63 y.o. female with medical history significant of metastatic pancreatic adenocarcinoma to liver, pancytopenia, cirrhosis with ascites, GERD, presented with worsening of abdominal pain likely due to large ascites (LVP on 11/19, drained 4.2 L, improved abdominal pain)  with hospital course c/b SVT requiring hemodynamic monitoring in ICU.  HPI: Patient had her routine chemotherapy last Wednesday and she has been feeling fine afterwards.  Friday, patient and her husband ate a roasted chicken for lunch and both became sick in the evening, their symptoms including abdominal pain nauseous vomiting, they attributed to possible food toxin from the chicken.  Husband however recovered the next day and the patient on the other hand continue to feel sick, she had 1 day of diarrhea and continued to have nauseous and vomiting and decreased appetite for last 3 to 4 days, she could only tolerate some Ensure, and became very dehydrated.  She denied any hematemesis or hematochezia.  Her abdominal pain mainly located on lower abdomen bilateral, constant since yesterday.  She denied any fever or chills.  She told me that she is scheduled to have platelet transfusion at [facility] cancer center today.   ED Course: Afebrile, borderline tachycardia blood pressure 92/60, O2 saturation 100% on room air.  CT abdomen pelvis showed no acute findings, moderate ascites.  Blood work showed INR 1.6, sodium 128 BUN 39 creatinine 1.0 glucose 132 albumin  2.3, WBC 9.8 hemoglobin 8.0 compared to baseline 8.2-9.6, platelet 25.   Oncology summary per [facility] Note 04/04/24: Initially treated for LAPC with FOLFOX with addition of GCSF, but c/b TCP so FOLFOX stopped and now s/p chemoradiation completed 05/17/23 with metastatic recurrence  12/2023. 04/04/24 resume gemcitabine and add reduce dose nab-paclitaxel, hold off on home administration of GCSF.  Pertinent  Medical History   Past Medical History:  Diagnosis Date   Arthritis    Cancer (HCC)    pancreatic   GERD (gastroesophageal reflux disease)    PONV (postoperative nausea and vomiting)    Prediabetes    Significant Hospital Events: Including procedures, antibiotic start and stop dates in addition to other pertinent events   11/19 --> admitted, large volume paracentesis 4.2 L drained 11/22 --> SVT, given adenosine  6 mg and 12 mg, did not abort, came down to ICU for DCCV. Given amio bolus and infusion, came out of a. Fib into NSR. 11/23 --> overnight developed another episode of a. Fib with RVR, given another bolus of amio, back to NSR. Hgb down to 6.8, 1 unit of PRBCs ordered. 11/24 rebolus Amio for RVR , albumin  given 11/25 2.5 L paracentesis after Cherina transfusion, worsening shock 11/25 ETT >> 11/26 blood cultures showing Klebsiella and Enterococcus >> abx to cefepime  plus ampicillin  11/27 Amio to oral 11/28 platelet transfusion, Port-A-Cath DC'd, left IJ placed 11/30 twitching of head and extremities, EEG obtained and negative 12/2 oncology consulted 12/3 ongoing goals of care discussions with family and PMT 12/4 failed SBT after just 5 minutes due to low volumes and desaturation  Interim History / Subjective:  failed SBT after just 5 minutes due to low volumes and desaturation. Back to full support with improvement. Discussed with family that I don't see her being able to successfully extubate without comfort measures in place. Good response to lasix  yesterday with 4.6L  UOP but SCr now up again.  Objective    Blood pressure (!) 158/63, pulse 73, temperature 98.6 F (37 C), temperature source Axillary, resp. rate (!) 26, height 5' 5 (1.651 m), weight 71 kg, last menstrual period 01/11/2011, SpO2 (!) 81%.    Vent Mode: PRVC FiO2 (%):  [30 %] 30  % Set Rate:  [18 bmp] 18 bmp Vt Set:  [450 mL] 450 mL PEEP:  [5 cmH20] 5 cmH20 Pressure Support:  [5 cmH20] 5 cmH20 Plateau Pressure:  [12 cmH20-14 cmH20] 13 cmH20   Intake/Output Summary (Last 24 hours) at 04/26/2024 0856 Last data filed at 04/26/2024 0616 Gross per 24 hour  Intake 1411.96 ml  Output 5225 ml  Net -3813.04 ml   Filed Weights   04/24/24 0439 04/25/24 0500 04/26/24 0446  Weight: 78 kg 77 kg 71 kg    Examination: General: Chronically ill appearing female, critically ill. Neuro: On low dose precedex , opens eyes to voice but doesn't follow commands. Globally weak. HEENT: [place]/AT. Sclerae anicteric. MMM. Cardiovascular: RRR, no M/R/G.  Lungs: Respirations shallow and unlabored.  CTA bilaterally, No W/R/R. Failed SBT due to low volumes and desaturations. Abdomen: BS x 4, soft, NT/ND.  Musculoskeletal: No gross deformities, 3+ edema to BUE and BLE.   Assessment and Plan   Sepsis from peritonitis with 1610 leukocytes, candida glabrata fungemia, enterococcus and klebsiella bacteremia. Shock physiology now resolved and she is off stress dose steroids - ID following, appreciate the assistance - Continue antibiotics: cefepime , ampicillin , micafungin  - If survives, will need 2 weeks of IV antibiotics AFTER line holiday per ID recs (note, has not had true line holiday, had port removed but then had line placed that same day) - Depending on above, will need to be seen by ophthalmology to r/u fungal endophthalmitis - Continue midodrine  as needed  Atrial fibrillation with RVR - resolved, now off amio and NSR. - Continue supportive care - Low Chads2VASc score, thrombocytopenia precludes anticoagulation  Acute respiratory failure with hypoxia, s/p intubation 11/25. Failed weaning 12/3 due to desaturations and low volumes/effort. - Unfortunately she has failed SBT and at this point, I do not see her successfully weaning and liberating from the ventilator without experiencing  distress shortly thereafter. I explained to husband and family members at the bedside that she is too weak and her body is not capable of sustaining life without heroic/life support measures. In short, she is in her terminal stages. I recommended compassionate extubation with comfort measures which family is discussing still. - Bronchial hygiene - PMT following  AKI - worse after diuresis trial Hypokalemia Hypernatremia Hyperchloremia Volume overload - +4.2L net. Good response to Lasix  but worsened renal fx. - Holding further diuresis - Replete K - Follow BMP  Pancreatic cancer with metastases - per last note from [facility] 04/04/24, Initially treated for LAPC with FOLFOX with addition of GCSF, but c/b TCP so FOLFOX stopped and now s/p chemoradiation completed 05/17/23 with metastatic recurrence 12/2023. 04/04/24 resume gemcitabine and add reduce dose nab-paclitaxel, hold off on home administration of GCSF. Malignant ascites - s/p 11/6, 11/20, 11/25 ? New liver mets - MRI liver from 03/17/24 at our system noted for new lesion in segment 4a - Continue supportive care - Palliative care following, appreciate the assistance - Needs ongoing goals of care conversations.  As already mentioned above, I do not see her successfully weaning and liberating from the ventilator without experiencing distress shortly thereafter. I explained to husband and family members at the bedside that she  is too weak and her body is not capable of sustaining life without heroic/life support measures. In short, she is in her terminal stages. I recommended compassionate extubation with comfort measures which family is discussing still. - Dr. Lanny consulted 12/2, appreciate the assistance. She has recommended palliative/hospice to family and will continue to follow along.  Severe thrombocytopenia - s/p platelets 11/25, 11/28 Chronic anemia - Transfuse platelets for goal greater than 10 or 50K for procedures - Hemoglobin  transfusion greater than 7 per protocol  Intermittent agitation/delirium. - Continue low dose Precedex .  CC time: 30 min.   Sammi Gore, PA - C [facility] For pager details, please see AMION or use Epic chat  After 1900, please call [facility] for cross coverage needs 04/26/2024, 8:56 AM

## 2024-04-26 NOTE — Progress Notes (Signed)
 Daily Progress Note   Patient Name: Nicole Johnson       Date: 04/26/2024 DOB: 1960/08/31  Age: 63 y.o. MRN#: [id number] Attending Physician: Neda Jennet DELENA, MD Primary Care Physician: Tower, Laine DELENA, MD Admit Date: 04/11/2024 Length of Stay: 14 days  Reason for Consultation/Follow-up: Establishing goals of care  Subjective:   Reviewed EMR including recent documentation from [facility] providers and oncology.  SBT performed in the morning did not go well as patient failed after 5 minutes due to low volumes and desaturation.  Personally reviewed recent BMP noting potassium low at 2.8, BUN elevated to 78, creatinine elevated to 2.09, and estimated GFR 26.  Platelets remain low at 33 on CBC. Discussed care with PCCM providers and bedside RN for medical updates and to coordinate care.  Presented to bedside to see patient.  Patient on sedation and full ventilator support due to desaturation and agitation earlier.  Patient's husband, son, sister, and niece present at bedside.  Discussed events of this morning including patient's desaturation.  Again spent time discussing severity of patient's underlying medical illness.  With permission able to discuss that at this time, believe the window is growing short to make a decision about extubation if their primary goal is to have time to hopefully be able to interact with the patient.  Discussed how patient is getting weaker every day being in the bed and now having worsening kidney function.  Expressed concern that should patient not be extubated without plan to reintubate, is very likely patient would die on ventilator support and that would take away any chance of the patient being able to interact with them without the machine.  Discussed that with the machine in place, patient has needed medications to manage anxiety and pain from having the ET tube in place.  Spent time answering questions regarding this.  Husband asking for another SBT at this time.  Noted  would discuss with PCCM providers though hopefully this would provide helpful information for family to process and make a decision. Re-presented to room after discussion with PCCM providers and RN and proceeded with SBT with sedation turned off.  With the time, family felt patient was motioning to have the ET tube pulled which indicated that she wanted it out.  Family discussed again planning with PCCM providers and decision was made to proceed with one-way compassionate extubation.  Family agreeing with change of CODE STATUS to DNR/DNI.  Continuing appropriate medical interventions at this time with hope they can get time to spend with the patient.  Objective:   Vital Signs:  BP (!) 143/59   Pulse (!) 53   Temp 97.6 F (36.4 C) (Axillary)   Resp 20   Ht 5' 5 (1.651 m)   Wt 71 kg   LMP 01/11/2011   SpO2 100%   BMI 26.05 kg/m   Physical Exam: General: Ill-appearing, intubated on ventilator support, frail Cardiovascular: RRR Respiratory: Intubated on ventilator support Abdomen: not distended Skin: Multiple ecchymoses on her upper extremities bilaterally Neuro: Randomly flexing feet bilaterally, not following commands  Assessment & Plan:   Assessment: Patient is a 63 year old female with a past medical history of metastatic pancreatic adenocarcinoma to liver, pancytopenia, cirrhosis with ascites, and GERD who was admitted on 04/11/2024 for management of abdominal pain likely due to large ascites.  Hospital course has been complicated by SVT; shock likely contributed to by cardiogenic shock and septic shock in setting of fungemia and bacteremia; acute hypoxic respiratory failure; AKI, thrombocytopenia.  Palliative medicine team consulted to assist with complex medical decision making.  Recommendations/Plan: # Complex medical decision making/goals of care:  - Patient unable to participate with complex medical decision making due to underlying medical status.  - Discussed care with  patient's husband, son, sister, and niece at bedside as detailed above in HPI.  Family, particularly patient's husband, has been hoping that if patient can be extubated, could potentially have some meaningful interaction with her.  Discussed concerned that window for this was closing as every day patient is in bed, she becomes weaker.  Discussed at this time decision is likely whether she would die with the ET tube in place or without it.  After discussions with family and PCCM providers, husband and family elected to proceed with one-way compassionate extubation.  CODE STATUS appropriately changed to DNR/DNI.  Continuing appropriate medical interventions otherwise with hope patient's family can have meaningful interactions and time with her.  Palliative medicine team continuing to follow along with patient's medical journey.  -  Code Status: Limited: Do not attempt resuscitation (DNR) -DNR-LIMITED -Do Not Intubate/DNI   # Discharge Planning: To Be Determined  Discussed with: PCCM providers, bedside RN, patient's husband/son/sister/niece  Thank you for allowing the palliative care team to participate in the care [name].  Tinnie Radar, DO Palliative Care Provider PMT # [phone]  If patient remains symptomatic despite maximum doses, please call PMT at [phone] between 0700 and 1900. Outside of these hours, please call attending, as PMT does not have night coverage.  Personally spent 55 minutes in patient care including extensive chart review (labs, imaging, progress/consult notes, vital signs), medically appropraite exam, discussed with treatment team, education to patient, family, and staff, documenting clinical information, medication review and management, coordination of care, and available advanced directive documents.

## 2024-04-26 NOTE — IPAL (Signed)
  Interdisciplinary Goals of Care Family Meeting   Date carried out: 04/26/2024  Location of the meeting: Bedside  Member's involved: Bedside Registered Nurse and Family Member or next of kin, Physician Assistant  Durable Power of Attorney or acting medical decision maker: Husband    Discussion: We discussed goals of care for [employer] .  I have had an extensive discussion with Mr. Whicker, their daughter, pt's sister . We discussed Lonya's current circumstances and organ failures. Husband Kiki says he thinks that Shaune is trying to pull at the ETT and indicating that she wants it out. Extensive discussions have been had over the previous few days with family, PCCM, palliative medicine, oncology. After contemplating with family and weighing all options, the family has decided to proceed with a one way compassionate extubation. They have also agreed not to perform resuscitation if arrest were to occur, but to otherwise continue with current medical support / therapies. They are hopeful that Falisa can at least communicate with them some after extubation and at best, get to spend some quality time with her.   Code status:   Code Status: Limited: Do not attempt resuscitation (DNR) -DNR-LIMITED -Do Not Intubate/DNI    Disposition: Continue current acute care  Time spent for the meeting: 20 min.    Kayzlee Wirtanen, PA-C  04/26/2024, 1:47 PM

## 2024-04-26 NOTE — Progress Notes (Addendum)
 Nicole Johnson   DOB:08-06-1960   FM#:[id number]      ASSESSMENT & PLAN:  Nicole Johnson 63 y.o. female with history significant for pancreatic cancer which is treated at [facility].  Patient was admitted 04/11/2024 from home due to complaints of abdominal pain, nausea, vomiting, and diarrhea. Medical Oncology following.    Acute respiratory failure -- Intubated 04/17/24, multiple attempts to wean.  Earlier attempt today was unsuccessful   -- CT head 11/30 shows no acute intracranial abnormality -- Continue supportive care  Pancreatic adenocarcinoma with liver mets - Initially diagnosed 01/06/2023 as a stage Ib. - Patient was seen at [facility] by Dr. Lanny however transferred her care to [facility] on 01/27/2023.  Mass was deemed unresectable therefore she started chemotherapy.  Status post FOLFOX chemotherapy regimen.  Due to cytopenias treatment plan was changed to radiation with Xeloda.   -- More recently was started on gemcitabine/nab-paclitaxel.  Received gemcitabine alone on 04/04/2024. - CA 19-9 levels trended by primary oncologist, last level 197. -- MR liver done 03/17/24 suspicion of new liver mets.   -- However CT imaging done 04/22/24 shows pancreatic head mass, likely stable with no evidence of new mets in abdomen or pelvis.  - Primary onc at [facility]/Dr. Donato.     -- Prognosis is very poor.   -- Appreciate Palliative/Dr. Clayton discussion with family -- Medical oncology/Dr. Pasam initially followed during this admission.  Dr. Lanny previously followed and will continue to follow at this time.     Sepsis -- Culture positive for enterococcus faecalis, enterobacterales, klebsiella, candida glabrata -- Port was removed.  -- On IV antibiotics, continue as ordered -- ID following   Leukopenia -resolved - Resolved. WBC normalized 5.1 today.  - Patient previously states receiving G CSF and most recently received after chemotherapy treatment.  However noted on [facility] documentation 04/04/2024 that G-CSF was  held. -- Continue to monitor CBC with differential   Anemia Iron deficiency anemia - Hemoglobin low 7.0.  Status post PRBC transfusions this admission.  - Recommend PRBC transfusion for hemoglobin <7.0.    - Status post IV iron given at [facility] hematologist office 02/22/2024. -- Continue to monitor CBC with differential   Thrombocytopenia - Platelets low 33K.  Status post multiple units of platelet transfusions this admission.   - Recommend platelet transfusion for counts <20 K or <50 K with bleeding.   - Previously prescribed Promacta  75 mg p.o. daily - States that she has been on injection every week for her platelets.  This is confirmed in [facility] documentation 04/04/2024.   -- Status post Nplate  65 mcg, given 11/21.    - Continue to monitor CBC with differential closely   Ascites Hyperbilirubinemia  - Secondary to malignancy - Status post multiple paracentesis with fluid removed. -- Elevated bilirubin -- Monitor closely.         Code Status DNR-Limited  Subjective:  Patient seen laying in bed, remains intubated. Attempt to wean earlier this morning failed. Patient with no response to verbal or tactile stimulation.  Palliative seen discussing with spouse and patient's family at bedside.    Objective:   Intake/Output Summary (Last 24 hours) at 04/26/2024 1024 Last data filed at 04/26/2024 1020 Gross per 24 hour  Intake 1555.95 ml  Output 5975 ml  Net -4419.05 ml     PHYSICAL EXAMINATION: ECOG PERFORMANCE STATUS: 4 - Bedbound  Vitals:   04/26/24 0900 04/26/24 1000  BP: 128/66 (!) 138/53  Pulse: (!) 55 (!) 51  Resp: 19 (!)  24  Temp:    SpO2: 100% 100%   Filed Weights   04/24/24 0439 04/25/24 0500 04/26/24 0446  Weight: 172 lb (78 kg) 169 lb 12.1 oz (77 kg) 156 lb 8.4 oz (71 kg)    GENERAL: +Intubated and ventilated SKIN: +Pale skin color, texture, turgor are normal, no rashes or significant lesions EYES: +eyes closed OROPHARYNX: no exudate, no erythema and  lips, buccal mucosa, and tongue normal  NECK: supple, thyroid  normal size, non-tender, without nodularity LYMPH: no palpable lymphadenopathy in the cervical, axillary or inguinal LUNGS: clear to auscultation and percussion with normal breathing effort HEART: regular rate & rhythm and no murmurs and no lower extremity edema ABDOMEN: abdomen soft, non-tender and normal bowel sounds MUSCULOSKELETAL: no cyanosis of digits and no clubbing  PSYCH: +Intubated with minimal response NEURO: no focal motor/sensory deficits   All questions were answered. The patient knows to call the clinic with any problems, questions or concerns.   The total time spent in the appointment was 40 minutes encounter with patient including review of chart and various tests results, discussions about plan of care and coordination of care plan  Olam JINNY Brunner, NP 04/26/2024 10:24 AM    Labs Reviewed:  Lab Results  Component Value Date   WBC 5.1 04/26/2024   HGB 7.0 (L) 04/26/2024   HCT 21.2 (L) 04/26/2024   MCV 89.8 04/26/2024   PLT 33 (L) 04/26/2024   Recent Labs    01/11/24 1112 01/18/24 1056 04/12/24 0813 04/13/24 0020 04/14/24 1201 04/15/24 0459 04/16/24 0445 04/24/24 0443 04/25/24 0633 04/25/24 1651 04/26/24 0401  NA 136   < > 131*   < > 132* 132*   < > 143 146*  --  148*  K 3.9   < > 4.6   < > 4.2 3.7   < > 3.2* 2.6* 3.5 2.8*  CL 101   < > 100   < > 101 100   < > 109 110  --  112*  CO2 31   < > 22   < > 21* 23   < > 21* 22  --  23  GLUCOSE 124*   < > 96   < > 139* 136*   < > 118* 109*  --  123*  BUN 12   < > 36*   < > 38* 38*   < > 72* 75*  --  78*  CREATININE 0.81   < > 1.12*   < > 1.10* 0.98   < > 1.74* 1.86*  --  2.09*  CALCIUM 8.4   < > 8.4*   < > 7.9* 7.8*   < > 8.0* 8.2*  --  8.5*  GFRNONAA  --    < > 55*   < > 57* >60   < > 33* 30*  --  26*  PROT 6.0   < > 4.9*  --  4.8* 4.8*  --   --   --   --   --   ALBUMIN  3.0*   < > 2.9*  --  2.3* 3.0*  --   --   --   --   --   AST 36   < > 22  --   45* 34  --   --   --   --   --   ALT 55*   < > 19  --  24 19  --   --   --   --   --  ALKPHOS 303*   < > 147*  --  135* 94  --   --   --   --   --   BILITOT 0.3   < > 1.8*  --  3.2* 2.6*  --   --   --   --   --   BILIDIR 0.1  --   --   --   --   --   --   --   --   --   --    < > = values in this interval not displayed.    Studies Reviewed:   CT ABDOMEN PELVIS WO CONTRAST Result Date: 04/23/2024 EXAM: CT ABDOMEN AND PELVIS WITHOUT CONTRAST 04/23/2024 12:15:00 AM TECHNIQUE: CT of the abdomen and pelvis was performed without the administration of intravenous contrast. Multiplanar reformatted images are provided for review. Automated exposure control, iterative reconstruction, and/or weight-based adjustment of the mA/kV was utilized to reduce the radiation dose to as low as reasonably achievable. COMPARISON: CT with intravenous contrast 04/11/2024 and 03/15/2024. CLINICAL HISTORY: Bowel obstruction suspected; Distension. History of pancreatic adenocarcinoma metastatic to liver. There is recurrent ascites requiring multiple paracenteses in the last 2 months. The last image-guided paracentesis was 04/11/2024 with 4 liters clear yellow fluid removed. FINDINGS: LOWER CHEST: The tip of an infusion catheter is again noted in the upper right atrium. The cardiac size is normal. Small new pericardial effusion. There are bilateral symmetric small new layering pleural effusions. The cardiac blood pool is less dense than the myocardium consistent with anemia. There are interstitial and diffuse increased ground glass opacities of the right greater than left lung bases consistent with pneumonia, edema or combination. There is a 1 cm new thin walled air cyst in the dorsal lingular base concerning for a cavitating septic thrombus. There is posterior atelectasis. LIVER: Hypodense liver metastases are again noted, with a cluster of metastases in the right lobe, the largest is 2.5 cm, previously 2.2 cm. There is a stable  segment 4 lesion measuring 1.7 cm. No new liver abnormality is seen without contrast. GALLBLADDER AND BILE DUCTS: The gallbladder is absent and there is a common bile duct stent in place. There is no overt biliary dilatation within the limits of noncontrast technique. SPLEEN: The spleen is enlarged again measuring 18 cm in length. PANCREAS: Pancreatic head mass lesion with encasement of the SMV and severe mass effect effacing the portal vein, was better demonstrated with contrast although there likely has been no interval change. The pancreatic body and tail are unremarkable. ADRENAL GLANDS: The adrenal glands are unremarkable without contrast. KIDNEYS, URETERS AND BLADDER: The kidneys are unremarkable without contrast. No stones in the kidneys or ureters. No hydronephrosis. No perinephric or periureteral stranding. The bladder is catheterized and partially contracted but has a normal wall thickness. Small amount of air noted in the bladder. GI AND BOWEL: NGT is in place terminating in the distal stomach with unremarkable gastric wall. There are thickened folds in the small bowel but no small bowel obstruction. This probably represents congestive wall edema, alternatively enteritis . There is dense fluid throughout most of the colon, formed stool in the rectosigmoid segment, with uncomplicated sigmoid diverticulosis. No free air is seen. The last two studies demonstrated circumferential wall thickening in the ascending and transverse colon, but this is not seen today. PERITONEUM AND RETROPERITONEUM: There is moderate free ascites, but less than previously. Diffuse mesenteric congestive change. No free air is seen. No free hemorrhage. VASCULATURE: Aorta is normal in caliber.  Encasement of the SMV and severe mass effect effacing the portal vein (related to pancreatic head mass). LYMPH NODES: No lymphadenopathy. REPRODUCTIVE ORGANS: No acute abnormality. BONES AND SOFT TISSUES: Degenerative change thoracic and lumbar  spine. No destructive bone lesions. . There is diffuse worsening of edema throughout the body wall compatible with fluid overload and/or third spacing. No acute osseous abnormality. No focal soft tissue abnormality. IMPRESSION: 1. Pancreatic head mass with encasement of the SMV and severe mass effect effacing the portal vein, likely stable compared to prior contrast-enhanced study. No evidence of new metastatic disease in the abdomen or pelvis. 2. Hypodense liver metastases with slight interval increase of the largest right lobe lesion to 2.5 cm from 2.2 cm, and a stable segment 4 lesion measuring 1.7 cm. 3. Small new pericardial effusion and bilateral symmetric small new layering pleural effusions. 4. Right-greater-than-left basilar interstitial and ground-glass opacities that may reflect pneumonia, edema, or both. A new 1 cm thin-walled air cyst in the dorsal lingular base raises concern for a cavitating septic embolus. 5. Moderate free ascites, decreased from prior, with diffuse mesenteric congestion. 6. Thickened small-bowel folds likely reflecting congestive wall edema, alternatively enteritis . No small-bowel obstruction. 7. Interval resolution of prior circumferential wall thickening in the ascending and transverse colon. An appendix is not seen. . Electronically signed by: Francis Quam MD 04/23/2024 01:10 AM EST RP Workstation: [id number]   CT HEAD WO CONTRAST ( ) Result Date: 04/23/2024 CLINICAL DATA:  Initial evaluation for acute mental status change, unknown cause. EXAM: CT HEAD WITHOUT CONTRAST TECHNIQUE: Contiguous axial images were obtained from the base of the skull through the vertex without intravenous contrast. RADIATION DOSE REDUCTION: This exam was performed according to the departmental dose-optimization program which includes automated exposure control, adjustment of the mA and/or kV according to patient size and/or use of iterative reconstruction technique. COMPARISON:  None Available.  FINDINGS: Brain: Cerebral volume within normal limits. No acute intracranial hemorrhage. No acute large vessel territory infarct. No mass lesion, midline shift or mass effect. No hydrocephalus or extra-axial fluid collection. Vascular: No abnormal hyperdense vessel. Skull: Scalp soft tissues within normal limits.  Calvarium intact. Sinuses/Orbits: Globes orbital soft tissues within normal limits. Paranasal sinuses are clear. Trace right mastoid effusion noted, of doubtful significance. Left mastoid air cells are largely clear. Other: None. IMPRESSION: Normal head CT. No acute intracranial abnormality identified. Electronically Signed   By: Morene Hoard M.D.   On: 04/23/2024 00:32   Rapid EEG Result Date: 04/22/2024 Gregg Lek, MD     04/22/2024  8:09 PM Patient Name: JANARI GAGNER MRN: [id number] Epilepsy Attending: Lek Gregg Referring Physician/Provider: No ref. provider found     Date: 04/22/2024 Start time: 04/22/2024 at 1150 AM End Time: 04/22/2024 at 0210 PM Duration: 2 hours and 19 minutes Patient history: 63 year old woman with metastatic pancreatic adenocarcinoma presenting with worsening abdominal pain and worsening mental status with seizure like activity during round. Ceribell to evaluate for seizure. Description: This EEG was obtained using a 10 lead EEG system positioned circumferentially without any parasagittal coverage (rapid EEG). Computer selected EEG is reviewed as  well as background features and all clinically significant events. ABNORMALITY: Diffuse slowing IMPRESSION: This study is suggestive of a generalized brain dysfunction, such as encephalopathy, non specific etiology, likely related to medication side effect or toxic metabolic. No seizures seen during this recording. Lek Gregg [facility] Neu[facility]   DG Chest Port 1 View Result Date: 04/22/2024 EXAM: 1 VIEW(S) XRAY OF THE CHEST 04/22/2024 05:56:53  AM COMPARISON: 04/20/2024 CLINICAL HISTORY: Acute respiratory failure  (HCC) FINDINGS: LINES, TUBES AND DEVICES: Endotracheal tube in place with tip 1.5 cm above the carina. Esophageal temperature probe in place with tip 3.3 cm above the carina. Enteric tube in place with tip terminating over the stomach. Left IJ CVC in place with tip overlying the right atrium. Right-sided chest port removed. LUNGS AND PLEURA: Diffuse bilateral airspace opacities, improved. No pleural effusion. No pneumothorax. HEART AND MEDIASTINUM: No acute abnormality of the cardiac and mediastinal silhouettes. BONES AND SOFT TISSUES: No acute osseous abnormality. IMPRESSION: 1. Diffuse bilateral airspace opacities, improved. Electronically signed by: Franky Stanford MD 04/22/2024 11:37 AM EST RP Workstation: [id number]   IR REMOVAL TUN ACCESS W/ PORT W/O FL MOD SED Result Date: 04/20/2024 CLINICAL DATA:  Sepsis, bacteremia, port catheter removal requested EXAM: EXAM TUNNELED PORT CATHETER REMOVAL TECHNIQUE: Overlying skin prepped with chlorhexidine , draped in usual sterile fashion, infiltrated locally with 1% lidocaine . A small incision was made over the scar from previous placement. The port catheter was dissected free from the underlying soft tissues and removed intact. Catheter tip was cut and sent for culture as requested. Hemostasis was achieved. The port pocket was closed with deep interrupted and subcuticular continuous 3-0 Monocryl sutures, then covered with Dermabond. The patient tolerated the procedure well. COMPLICATIONS: COMPLICATIONS None immediate IMPRESSION: 1.  Technically successful tunneled Port catheter removal. Electronically Signed   By: JONETTA Faes M.D.   On: 04/20/2024 15:53   DG CHEST PORT 1 VIEW Result Date: 04/20/2024 CLINICAL DATA:  Central line placement. EXAM: PORTABLE CHEST 1 VIEW COMPARISON:  04/19/2024. FINDINGS: Left internal jugular central venous catheter has its tip projecting in the right atrium. No pneumothorax. Endotracheal tube, nasogastric tube and right anterior chest  wall Port-A-Cath are stable. Bilateral airspace lung opacities are unchanged from the previous day's exam. IMPRESSION: 1. New left internal jugular central venous catheter has its tip in the right atrium. No pneumothorax. 2. No other change from the previous day's study. Persistent bilateral airspace lung opacities. Electronically Signed   By: Alm Parkins M.D.   On: 04/20/2024 12:53   DG Chest Port 1 View Result Date: 04/19/2024 EXAM: 1 VIEW(S) XRAY OF THE CHEST 04/19/2024 05:33:00 AM COMPARISON: Portable chest yesterday at 01:55 pm. CLINICAL HISTORY: 5626 Acute respiratory failure (HCC) 5626 Acute respiratory failure (HCC). FINDINGS: LINES, TUBES AND DEVICES: Ett terminates 3 cm from the carina. Esophageal temperature probe terminates at this same level. Ngt in the stomach but the intra-gastric course is out of view. Right IJ port catheter terminates in the upper right atrium. LUNGS AND PLEURA: Interstitial and patchy diffuse airspace disease of the bilateral lungs is again noted consistent with pneumonia, edema or combination, with small pleural effusions. No interval improvement or worsening is seen. No pneumothorax. HEART AND MEDIASTINUM: The mediastinum is stable. There is mild cardiomegaly, mild central vascular fullness. BONES AND SOFT TISSUES: Thoracic spondylosis and bridging enthesopathy. IMPRESSION: 1. Interstitial and patchy diffuse airspace disease of both lungs, consistent with pneumonia, edema, or a combination, with small pleural effusions, without interval change. 2. Mild cardiomegaly and mild central vascular congestion. Electronically signed by: Francis Quam MD 04/19/2024 07:47 AM EST RP Workstation: [id number]   DG CHEST PORT 1 VIEW Result Date: 04/17/2024 CLINICAL DATA:  Acute respiratory failure, hypoxia EXAM: PORTABLE CHEST 1 VIEW COMPARISON:  03/15/2024 FINDINGS: Single frontal view of the chest demonstrates stable right chest wall port. Cardiac silhouette is unremarkable. Interval  development of multifocal bilateral ground-glass airspace disease, right  greater than left. No effusion or pneumothorax. Moderate gaseous distention of the stomach. IMPRESSION: 1. Multifocal bilateral ground-glass airspace disease, consistent with edema or widespread pneumonia. Electronically Signed   By: Ozell Daring M.D.   On: 04/17/2024 15:24   DG Chest Port 1 View Result Date: 04/17/2024 CLINICAL DATA:  [id number] Endotracheally intubated [id number] EXAM: PORTABLE CHEST - 1 VIEW COMPARISON:  April 17, 2024 8:34 a.m. FINDINGS: Right chest port terminates at the cavoatrial junction. Esophagogastric tube courses below the diaphragm with the distal tip not included in the field of view. Endotracheal tube in place terminating in the mid trachea. Similar extensive hazy airspace opacities throughout both lungs. No large pleural effusion or pneumothorax. No cardiomegaly. IMPRESSION: 1. No significant interval change to the lungs. 2. Endotracheal tube has been placed in the interim, terminating in the mid trachea. No pneumothorax. 3. Esophagogastric tube courses below the diaphragm with the distal tip not included in the field of view Electronically Signed   By: Rogelia Myers M.D.   On: 04/17/2024 15:22   DG Abd 1 View Result Date: 04/17/2024 CLINICAL DATA:  Enteric catheter placement EXAM: ABDOMEN - 1 VIEW COMPARISON:  04/11/2024 FINDINGS: Frontal view of the lower chest and upper abdomen was obtained, excluding the left flank by collimation. Enteric catheter passes below diaphragm, tip projects over the region of the pylorus. Metallic biliary stent overlies right upper quadrant. Nonspecific gaseous distension of the large and small bowel, with moderate stool throughout the colon. Patchy bibasilar airspace disease, right greater than left. IMPRESSION: 1. Enteric catheter tip projecting over the gastric pylorus. 2. Nonspecific gaseous distention of the bowel, with moderate stool throughout the colon. 3.  Bibasilar airspace disease. Electronically Signed   By: Ozell Daring M.D.   On: 04/17/2024 15:21   ECHOCARDIOGRAM COMPLETE Result Date: 04/15/2024    ECHOCARDIOGRAM REPORT   Patient Name:   DAVIONA HERBERT Date of Exam: 04/15/2024 Medical Rec #:  [id number]    Height:       65.0 in Accession #:    [id number]   Weight:       138.2 lb Date of Birth:  August 25, 1960    BSA:          1.691 m Patient Age:    63 years     BP:           101/60 mmHg Patient Gender: F            HR:           120 bpm. Exam Location:  Inpatient Procedure: 2D Echo, Cardiac Doppler and Color Doppler (Both Spectral and Color            Flow Doppler were utilized during procedure). Indications:    Shock R57.9  History:        Patient has no prior history of Echocardiogram examinations.  Sonographer:    Tinnie Gosling RDCS Referring Phys: [id number] PAULA SOUTHERLY IMPRESSIONS  1. Left ventricular ejection fraction, by estimation, is 65 to 70%. The left ventricle has normal function. The left ventricle has no regional wall motion abnormalities. Left ventricular diastolic parameters are indeterminate. Elevated left atrial pressure.  2. Right ventricular systolic function is normal. The right ventricular size is normal. There is mildly elevated pulmonary artery systolic pressure. The estimated right ventricular systolic pressure is 36.5 mmHg.  3. Left atrial size was mildly dilated.  4. Right atrial size was mildly dilated.  5. The mitral valve is grossly normal. Trivial mitral valve regurgitation.  No evidence of mitral stenosis.  6. Tricuspid valve regurgitation is moderate.  7. The aortic valve is tricuspid. Aortic valve regurgitation is not visualized. No aortic stenosis is present.  8. The inferior vena cava is dilated in size with >50% respiratory variability, suggesting right atrial pressure of 8 mmHg.  9. Rhythm strip during this exam demonstrates atrial fibrillation with rapid ventricular response. FINDINGS  Left Ventricle: Left ventricular  ejection fraction, by estimation, is 65 to 70%. The left ventricle has normal function. The left ventricle has no regional wall motion abnormalities. The left ventricular internal cavity size was normal in size. There is  no left ventricular hypertrophy. Left ventricular diastolic parameters are indeterminate. Elevated left atrial pressure. Right Ventricle: The right ventricular size is normal. No increase in right ventricular wall thickness. Right ventricular systolic function is normal. There is mildly elevated pulmonary artery systolic pressure. The tricuspid regurgitant velocity is 2.67  m/s, and with an assumed right atrial pressure of 8 mmHg, the estimated right ventricular systolic pressure is 36.5 mmHg. Left Atrium: Left atrial size was mildly dilated. Right Atrium: Right atrial size was mildly dilated. Pericardium: There is no evidence of pericardial effusion. Mitral Valve: The mitral valve is grossly normal. Trivial mitral valve regurgitation. No evidence of mitral valve stenosis. Tricuspid Valve: The tricuspid valve is normal in structure. Tricuspid valve regurgitation is moderate . No evidence of tricuspid stenosis. Aortic Valve: The aortic valve is tricuspid. Aortic valve regurgitation is not visualized. No aortic stenosis is present. Aortic valve mean gradient measures 5.8 mmHg. Aortic valve peak gradient measures 12.2 mmHg. Aortic valve area, by VTI measures 1.99  cm. Pulmonic Valve: The pulmonic valve was normal in structure. Pulmonic valve regurgitation is trivial. No evidence of pulmonic stenosis. Aorta: The aortic root is normal in size and structure. Venous: The inferior vena cava is dilated in size with greater than 50% respiratory variability, suggesting right atrial pressure of 8 mmHg. IAS/Shunts: No atrial level shunt detected by color flow Doppler. EKG: Rhythm strip during this exam demonstrates atrial fibrillation.  LEFT VENTRICLE PLAX 2D LVIDd:         3.40 cm   Diastology LVIDs:          2.10 cm   LV e' medial:    9.79 cm/s LV PW:         0.90 cm   LV E/e' medial:  12.7 LV IVS:        0.90 cm   LV e' lateral:   14.00 cm/s LVOT diam:     1.80 cm   LV E/e' lateral: 8.9 LV SV:         47 LV SV Index:   28 LVOT Area:     2.54 cm  RIGHT VENTRICLE             IVC RV S prime:     21.00 cm/s  IVC diam: 2.10 cm TAPSE (M-mode): 2.4 cm LEFT ATRIUM             Index        RIGHT ATRIUM           Index LA diam:        3.80 cm 2.25 cm/m   RA Area:     15.10 cm LA Vol (A2C):   56.9 ml 33.65 ml/m  RA Volume:   40.30 ml  23.84 ml/m LA Vol (A4C):   50.7 ml 29.99 ml/m LA Biplane Vol: 55.6 ml 32.89 ml/m  AORTIC VALVE  AV Area (Vmax):    2.08 cm AV Area (Vmean):   2.18 cm AV Area (VTI):     1.99 cm AV Vmax:           174.95 cm/s AV Vmean:          113.914 cm/s AV VTI:            0.237 m AV Peak Grad:      12.2 mmHg AV Mean Grad:      5.8 mmHg LVOT Vmax:         143.00 cm/s LVOT Vmean:        97.600 cm/s LVOT VTI:          0.185 m LVOT/AV VTI ratio: 0.78  AORTA Ao Root diam: 2.90 cm Ao Asc diam:  3.20 cm MITRAL VALVE                TRICUSPID VALVE MV Area (PHT): 8.52 cm     TR Peak grad:   28.5 mmHg MV Decel Time: 89 msec      TR Vmax:        267.00 cm/s MV E velocity: 124.00 cm/s                             SHUNTS                             Systemic VTI:  0.18 m                             Systemic Diam: 1.80 cm Soyla Merck MD Electronically signed by Soyla Merck MD Signature Date/Time: 04/15/2024/10:02:39 AM    Final    US  Paracentesis Result Date: 04/12/2024 INDICATION: Patient with history of pancreatic cancer with recurrent malignant ascites; request received for diagnostic and therapeutic paracentesis. EXAM: ULTRASOUND GUIDED DIAGNOSTIC AND THERAPEUTIC PARACENTESIS MEDICATIONS: 8 mL 1% lidocaine  with epinephrine  to skin/subcutaneous tissue COMPLICATIONS: None immediate. PROCEDURE: Informed written consent was obtained from the patient after a discussion of the risks, benefits and alternatives  to treatment. A timeout was performed prior to the initiation of the procedure. Initial ultrasound scanning demonstrates a large amount of ascites within the right lower abdominal quadrant. The right lower abdomen was prepped and draped in the usual sterile fashion. 1% lidocaine  with epinephrine  was used for local anesthesia. Following this, a 19 gauge, 10-cm, Yueh catheter was introduced. An ultrasound image was saved for documentation purposes. The paracentesis was performed. The catheter was removed and a dressing was applied. The patient tolerated the procedure well without immediate post procedural complication. FINDINGS: A total of approximately 4.2 liters of slightly hazy, yellow fluid was removed. Samples were sent to the laboratory as requested by the clinical team. IMPRESSION: Successful ultrasound-guided diagnostic and therapeutic paracentesis yielding 4.2 liters of peritoneal fluid. Performed by: Franky Rakers, PA-C Electronically Signed   By: JONETTA Faes M.D.   On: 04/12/2024 00:13   CT ABDOMEN PELVIS W CONTRAST Result Date: 04/11/2024 EXAM: CT ABDOMEN AND PELVIS WITH CONTRAST 04/11/2024 11:35:03 AM TECHNIQUE: CT of the abdomen and pelvis was performed with the administration of 100 mL of iohexol  (OMNIPAQUE ) 300 MG/ML solution. Multiplanar reformatted images are provided for review. Automated exposure control, iterative reconstruction, and/or weight-based adjustment of the mA/kV was utilized to reduce the radiation dose to as low as reasonably achievable. COMPARISON: 03/15/2024  CLINICAL HISTORY: Abdominal pain, acute, nonlocalized. FINDINGS: LOWER CHEST: No acute abnormality. LIVER: Multiple rounded low densities are noted in the posterior segment of the right hepatic lobe which were not significantly changed compared to prior exam consistent with metastatic disease. Stable left hepatic lobe density is also noted concerning for metastatic disease. Left hepatic pneumobilia is again noted. GALLBLADDER  AND BILE DUCTS: Gallbladder is unremarkable. No biliary ductal dilatation. SPLEEN: Mild splenomegaly is noted. PANCREAS: Continued presence of infiltrative pancreatic head mass superior mesenteric and portal vein and probably celiac axis as noted on prior exam. ADRENAL GLANDS: No acute abnormality. KIDNEYS, URETERS AND BLADDER: No stones in the kidneys or ureters. No hydronephrosis. No perinephric or periureteral stranding. Urinary bladder is unremarkable. GI AND BOWEL: Stomach demonstrates no acute abnormality. Wall thickening of the right and transverse colon is noted which may represent edema or possibly infectious or inflammatory colitis. There is no bowel obstruction. PERITONEUM AND RETROPERITONEUM: Moderate ascites is noted. No free air. VASCULATURE: Aorta is normal in caliber. LYMPH NODES: No lymphadenopathy. REPRODUCTIVE ORGANS: No acute abnormality. BONES AND SOFT TISSUES: No acute osseous abnormality. No focal soft tissue abnormality. IMPRESSION: 1. Stable hepatic metastases in the right posterior segment and left hepatic lobe density consistent with metastasis. 2. Infiltrative pancreatic head mass with involvement of the superior mesenteric vein, portal vein, and probable celiac axis, unchanged. 3. Right and transverse colonic wall thickening, which may represent edema or infectious/inflammatory colitis. 4. Mild splenomegaly and moderate ascites. Electronically signed by: Lynwood Seip MD 04/11/2024 12:05 PM EST RP Workstation: [id number]   IR Paracentesis Result Date: 03/29/2024 INDICATION: Patient with a history of pancreatic cancer with recurrent ascites. Interventional Radiology asked to perform a therapeutic paracentesis with 4 L max EXAM: ULTRASOUND GUIDED PARACENTESIS MEDICATIONS: 1% lidocaine  10 ml COMPLICATIONS: None immediate. PROCEDURE: Informed written consent was obtained from the patient after a discussion of the risks, benefits and alternatives to treatment. A timeout was performed prior  to the initiation of the procedure. Initial ultrasound scanning demonstrates a large amount of ascites within the left lower abdominal quadrant. The left lower abdomen was prepped and draped in the usual sterile fashion. 1% lidocaine  was used for local anesthesia. Following this, a 19 gauge, 7-cm, Yueh catheter was introduced. An ultrasound image was saved for documentation purposes. The paracentesis was performed. The catheter was removed and a dressing was applied. The patient tolerated the procedure well without immediate post procedural complication. Patient received post-procedure intravenous albumin ; see nursing notes for details. FINDINGS: A total of approximately 4 L of clear yellow fluid was removed. IMPRESSION: Successful ultrasound-guided paracentesis yielding 4 liters of peritoneal fluid. Procedure performed by Warren Dais, NP Electronically Signed   By: Ester Sides M.D.   On: 03/29/2024 09:52    Addendum I have seen the patient, examined her. I agree with the assessment and and plan and have edited the notes.   Patient's family has agreed with DNR/DNI today and she was extubated today.  She was lethargic, resting in bed, surrounded by her family, emotional support offered. I will f/u as needed.   Onita Mattock MD 04/26/2024

## 2024-04-26 NOTE — Procedures (Signed)
 Extubation Procedure Note  Patient Details:   Name: Nicole Johnson DOB: [birth year] MRN: [id number]   Airway Documentation:    Vent end date: 04/26/24 Vent end time: 1313   Evaluation  O2 sats: currently acceptable Complications: No apparent complications Patient did tolerate procedure well. Bilateral Breath Sounds: Clear   No  One way extubation  Denika Krone 04/26/2024, 1:14 PM

## 2024-04-27 DIAGNOSIS — B379 Candidiasis, unspecified: Secondary | ICD-10-CM | POA: Diagnosis not present

## 2024-04-27 DIAGNOSIS — J9601 Acute respiratory failure with hypoxia: Secondary | ICD-10-CM | POA: Diagnosis not present

## 2024-04-27 DIAGNOSIS — A499 Bacterial infection, unspecified: Secondary | ICD-10-CM | POA: Diagnosis not present

## 2024-04-27 DIAGNOSIS — B49 Unspecified mycosis: Secondary | ICD-10-CM | POA: Diagnosis not present

## 2024-04-27 DIAGNOSIS — N179 Acute kidney failure, unspecified: Secondary | ICD-10-CM | POA: Diagnosis not present

## 2024-04-27 LAB — BASIC METABOLIC PANEL WITH GFR
Anion gap: 15 (ref 5–15)
BUN: 85 mg/dL — ABNORMAL HIGH (ref 8–23)
CO2: 21 mmol/L — ABNORMAL LOW (ref 22–32)
Calcium: 8.2 mg/dL — ABNORMAL LOW (ref 8.9–10.3)
Chloride: 117 mmol/L — ABNORMAL HIGH (ref 98–111)
Creatinine, Ser: 2.17 mg/dL — ABNORMAL HIGH (ref 0.44–1.00)
GFR, Estimated: 25 mL/min — ABNORMAL LOW (ref >60)
Glucose, Bld: 111 mg/dL — ABNORMAL HIGH (ref 70–99)
Potassium: 3.2 mmol/L — ABNORMAL LOW (ref 3.5–5.1)
Sodium: 154 mmol/L — ABNORMAL HIGH (ref 135–145)

## 2024-04-27 LAB — CBC
HCT: 23 % — ABNORMAL LOW (ref 36.0–46.0)
Hemoglobin: 7.4 g/dL — ABNORMAL LOW (ref 12.0–15.0)
MCH: 29.4 pg (ref 26.0–34.0)
MCHC: 32.2 g/dL (ref 30.0–36.0)
MCV: 91.3 fL (ref 80.0–100.0)
Platelets: 42 K/uL — ABNORMAL LOW (ref 150–400)
RBC: 2.52 MIL/uL — ABNORMAL LOW (ref 3.87–5.11)
RDW: 18.3 % — ABNORMAL HIGH (ref 11.5–15.5)
WBC: 11.3 K/uL — ABNORMAL HIGH (ref 4.0–10.5)
nRBC: 0 % (ref 0.0–0.2)

## 2024-04-27 LAB — MAGNESIUM: Magnesium: 2.9 mg/dL — ABNORMAL HIGH (ref 1.7–2.4)

## 2024-04-27 LAB — PHOSPHORUS: Phosphorus: 5.2 mg/dL — ABNORMAL HIGH (ref 2.5–4.6)

## 2024-04-27 LAB — GLUCOSE, CAPILLARY
Glucose-Capillary: 103 mg/dL — ABNORMAL HIGH (ref 70–99)
Glucose-Capillary: 102 mg/dL — ABNORMAL HIGH (ref 70–99)

## 2024-04-27 MED ORDER — LORAZEPAM 2 MG/ML PO CONC: 0.5000 mg | ORAL | Status: DC | PRN

## 2024-04-27 MED ORDER — MIDODRINE HCL 5 MG PO TABS: 10.0000 mg | ORAL_TABLET | Freq: Three times a day (TID) | ORAL | Status: DC

## 2024-04-27 MED ORDER — OXYCODONE HCL 5 MG/5ML PO SOLN: 5.0000 mg | ORAL | Status: DC | PRN

## 2024-04-27 MED ORDER — GLYCOPYRROLATE 0.2 MG/ML IJ SOLN
0.3000 mg | Freq: Four times a day (QID) | INTRAMUSCULAR | Status: DC
  Administered 2024-04-27 (×3): 0.3 mg via INTRAVENOUS
  Filled 2024-04-27 (×3): qty 2

## 2024-04-27 MED ORDER — HYDROMORPHONE HCL 1 MG/ML IJ SOLN: 0.2000 mg | INTRAMUSCULAR | Status: DC | PRN

## 2024-04-27 NOTE — Progress Notes (Signed)
 eLink Physician-Brief Progress Note Patient Name: Nicole Johnson DOB: [birth year] MRN: [id number]   Date of Service  04/27/2024  HPI/Events of Note  Patient was extubated and now DNR with hopes to keep her comfortable.  She seems to develop some gagging/cough when attempting to suction secretions.  Not in overt respiratory distress but uncomfortable  eICU Interventions  Add glycopyrrolate  to help with secretion management     Intervention Category Intermediate Interventions: Respiratory distress - evaluation and management  [name] 04/27/2024, 3:56 AM

## 2024-04-27 NOTE — Progress Notes (Signed)
 Daily Progress Note   Patient Name: Nicole Johnson       Date: 04/27/2024 DOB: [birth year]  Age: 63 y.o. MRN#: [id number] Attending Physician: Nicole Toribio SQUIBB, MD Primary Care Physician: Tower, Nicole DELENA, MD Admit Date: 04/11/2024 Length of Stay: 15 days  Reason for Consultation/Follow-up: Establishing goals of care  Subjective:   Reviewed EMR including recent documentation from chaplain, oncology, and PCCM.  Patient transferred out of ICU to floor room.  Reviewed recent BMP noting creatinine continuing to increase at 2.17 and BUN 85 so GFR 25.  Multiple electrolyte abnormalities noted.  CBC reviewed noted WBC 11.3 and platelets 42.  Patient already receiving antibiotics for bacteremia and fungemia.  Discussed care with bedside RN for medical update.  Presented to bedside to see patient and family.  Patient lying in bed and at times will open eyes though nonverbal and appears lethargic.  Patient's husband, sister, and niece present at bedside.  Spent time providing emotional support via active listening.  Described how they were watching Christmas movies with the patient and trying to get her to interact as possible.  Trying to focus on enjoying time together. With permission, discussed care planning moving forward.  Noted how at this time focus would be on her comfort.  We discussed allowing her oral intake even just ice chips to support comfort.  Husband agreeing with this.  Also discussed that this would be the window to work to get patient home with hospice support if the goal was to have time at home together.  Spent time discussing philosophy of hospice and focusing on comfort at home, not aggressive medical interventions.  After discussion, all family excited to work on getting patient home with hospice support as soon as possible.  They realize patient would be much happier at home particularly to focus on comfort at end-of-life.  Able to discuss and with their input, noted would reach out to  [facility] [place] about how soon patient could get home with hospice support to focus on care at end-of-life.  Family agreeing with this plan.  Able to speak with hospitalist, bedside RN, TOC, and [facility] liaison after visit.  After discussions with family, hospice liaison able to coordinate to work on getting patient home with hospice today once equipment delivered this afternoon.  Family very excited about patient being able to go home.  Family picking up comfort medications to support hospice care at home.  Objective:   Vital Signs:  BP 129/61   Pulse 88   Temp 99.2 F (37.3 C) (Axillary)   Resp 18   Ht 5' 5 (1.651 m)   Wt 68 kg   LMP 01/11/2011   SpO2 100%   BMI 24.95 kg/m   Physical Exam: General: Ill-appearing, opens eyes at times though overall lethargic, frail Cardiovascular: RRR Respiratory: No increased work of breathing Abdomen: not distended Skin: Multiple ecchymoses on her upper extremities bilaterally Neuro: Opening eyes randomly at times  Assessment & Plan:   Assessment: Patient is a 63 year old female with a past medical history of metastatic pancreatic adenocarcinoma to liver, pancytopenia, cirrhosis with ascites, and GERD who was admitted on 04/11/2024 for management of abdominal pain likely due to large ascites.  Hospital course has been complicated by SVT; shock likely contributed to by cardiogenic shock and septic shock in setting of fungemia and bacteremia; acute hypoxic respiratory failure; AKI, thrombocytopenia.  Palliative medicine team consulted to assist with complex medical decision making.  Recommendations/Plan: # Complex  medical decision making/goals of care:  - Patient unable to participate with complex medical decision making due to underlying medical status.  - Discussed care with patient's husband, sister, and niece at bedside as detailed above in HPI.  Discussed transitioning to full comfort focused care at this time.  Would  allow patient to eat for comfort with acknowledging aspiration risk.  After discussion family supporting of working to get patient home as soon as possible with support from [facility] the [place].  Hospice was able to coordinate patient going home today.  -  Code Status: Do not attempt resuscitation (DNR) - Comfort care  # Discharge Planning: Home with Hospice today  Discussed with: Hospitalist, bedside RN, patient's husband/sister/niece, TOC, [facility] the [facility] liaison  Thank you for allowing the palliative care team to participate in the care Nicole Johnson.  Nicole Radar, DO Palliative Care Provider PMT # [phone]  If patient remains symptomatic despite maximum doses, please call PMT at [phone] between 0700 and 1900. Outside of these hours, please call attending, as PMT does not have night coverage.  Billing based on MDM: High  Problems Addressed: One or more chronic illnesses with severe exacerbation, progression, or side effects of treatment.  Risks: Decision regarding hospitalization or escalation of hospital care-patient transition to full comfort focused care and working to discharge today with hospice support at home for end-of-life care

## 2024-04-27 NOTE — Discharge Summary (Signed)
 Physician Discharge Summary   Patient: Nicole Johnson MRN: [id number] DOB: [birth year]  Admit date:     04/11/2024  Discharge date: 04/27/24  Discharge Physician: Toribio Door   PCP: Randeen Laine DELENA, MD   Recommendations at discharge:  Home with hospice  Discharge Diagnoses: Principal Problem:   Polymicrobial bacterial infection Active Problems:   Gastroenteritis   Antineoplastic chemotherapy induced anemia   Anemia   Acute respiratory failure with hypoxia (HCC)   Malnutrition of moderate degree   Candida glabrata infection   Fungemia   Counseling and coordination of care   Goals of care, counseling/discussion   Palliative care encounter   Malignant neoplasm metastatic to liver [facility])   Need for emotional support   AKI (acute kidney injury)   DNR (do not resuscitate)   Pressure injury of skin  Resolved Problems:   * No resolved hospital problems. *  Hospital Course: 63 year old woman PMH including metastatic pancreatic adenocarcinoma to liver, pancytopenia, cirrhosis with ascites, presented with worsening abdominal pain, nausea, vomiting.  Complicated hospital course evolved into respiratory failure requiring intubation, found to have polymicrobial septic shock, seen by multiple consultants, failed to improve, eventually family elected to have patient extubated and pursue hospice.  Consultants Oncology PCCM Palliative Medicine ID  Procedures/Events 11/19 --> admitted, large volume paracentesis 4.2 L drained 11/22 --> SVT, given adenosine  6 mg and 12 mg, did not abort, came down to ICU for DCCV. Given amio bolus and infusion, came out of a. Fib into NSR. 11/23 --> overnight developed another episode of a. Fib with RVR, given another bolus of amio, back to NSR. Hgb down to 6.8, 1 unit of PRBCs ordered. 11/24 rebolus Amio for RVR , albumin  given 11/25 2.5 L paracentesis after Cherina transfusion, worsening shock 11/25 ETT >> 11/26 blood cultures showing Klebsiella and  Enterococcus >> abx to cefepime  plus ampicillin  11/27 Amio to oral 11/28 platelet transfusion, Port-A-Cath DC'd, left IJ placed 11/30 twitching of head and extremities, EEG obtained and negative 12/2 oncology consulted, midline place 12/3 ongoing goals of care discussions with family and PMT 12/4 failed SBT after just 5 minutes due to low volumes and desaturation.  Subsequent one-way compassionate extubation.  Septic shock secondary to peritonitis Candida glabrata fungemia Enterococcus bacteremia Klebsiella bacteremia Treated with empiric antibiotics and antifungals.  Seen by infectious disease.  Acute hypoxic respiratory failure S/p intubation 11/25, extubated 12/4  AKI Worsened after diuresis trial  Hypokalemia Hypernatremia Hyperchloremia Volume overload Good urine output with Lasix  but renal function worsened  Atrial fibrillation with rapid ventricular response Treated with amiodarone , stable off amiodarone  Thrombocytopenia precluded anticoagulation  Metastatic pancreatic cancer Malignant ascites per last note from [facility] 04/04/24, Initially treated for LAPC with FOLFOX with addition of GCSF, but c/b TCP so FOLFOX stopped and now s/p chemoradiation completed 05/17/23 with metastatic recurrence 12/2023   Portal vein thrombosis treated previously with apixaban  Chronic coagulopathy Chronic hypoalbuminemia  Severe thrombocytopenia Chronic anemia S/p transfusions 11/25 and 11/28  SVT Treated with adenosine , DCCV, amiodarone   Non-severe (moderate) malnutrition in context of chronic illness   Wound type:Deep tissue Pressure Injury  sacrum purple maroon discoloration, appears to be evolving with some lifting of the epidermis noted  Pressure Injury POA: Yes  Bilateral heel deep tissue pressure injuries, present on admission          Disposition: Home with hospice Diet recommendation:  As desired DISCHARGE MEDICATION: Allergies as of 04/27/2024       Reactions    Other Nausea Only, Other (See  Comments)   Certain types of Anesthesia cause SEVERE NAUSEA   Codeine Nausea Only   Hydrocodone -acetaminophen  Nausea Only, Other (See Comments)   Reaction unconfirmed, ed/might be nausea   Meloxicam Nausea Only   Olanzapine Other (See Comments)   Made me very sleepy, but I could not sleep.   Oxycodone  Other (See Comments)   Made me loopy.   Tramadol Nausea Only        Medication List     STOP taking these medications    Advil  200 MG Caps Generic drug: Ibuprofen    Eliquis  5 MG Tabs tablet Generic drug: apixaban    Fulphila 6 MG/0.6ML injection Generic drug: pegfilgrastim-jmdb   MAGNESIUM  PO   ondansetron  8 MG tablet Commonly known as: ZOFRAN    PriLOSEC OTC 20 MG tablet Generic drug: omeprazole    prochlorperazine  10 MG tablet Commonly known as: COMPAZINE    spironolactone  100 MG tablet Commonly known as: ALDACTONE    VITAMIN B-12 PO       TAKE these medications    midodrine  10 MG tablet Commonly known as: PROAMATINE  Take 1 tablet (10 mg total) by mouth 3 (three) times daily with meals.               Discharge Care Instructions  (From admission, onward)           Start     Ordered   04/27/24 0000  Discharge wound care:       Comments: Cleanse sacral wound with Vashe, do not rinse.  Apply Xeroform gauze to wound bed daily and secure with silicone foam   04/27/24 1405          Comfort medications filled as per hospice, confirmed verbally in person  Discharge Exam: Filed Weights   04/25/24 0500 04/26/24 0446 04/27/24 0500  Weight: 77 kg 71 kg 68 kg   Physical Exam Vitals reviewed.  Constitutional:      General: She is not in acute distress.    Appearance: She is ill-appearing. She is not toxic-appearing.  Cardiovascular:     Rate and Rhythm: Normal rate and regular rhythm.     Heart sounds: No murmur heard. Pulmonary:     Effort: Pulmonary effort is normal. No respiratory distress.     Breath  sounds: No wheezing, rhonchi or rales.  Neurological:     Mental Status: She is alert.      Condition at discharge: poor  The results of significant diagnostics from this hospitalization (including imaging, microbiology, ancillary and laboratory) are listed below for reference.   Imaging Studies: CT ABDOMEN PELVIS WO CONTRAST Result Date: 04/23/2024 EXAM: CT ABDOMEN AND PELVIS WITHOUT CONTRAST 04/23/2024 12:15:00 AM TECHNIQUE: CT of the abdomen and pelvis was performed without the administration of intravenous contrast. Multiplanar reformatted images are provided for review. Automated exposure control, iterative reconstruction, and/or weight-based adjustment of the mA/kV was utilized to reduce the radiation dose to as low as reasonably achievable. COMPARISON: CT with intravenous contrast 04/11/2024 and 03/15/2024. CLINICAL HISTORY: Bowel obstruction suspected; Distension. History of pancreatic adenocarcinoma metastatic to liver. There is recurrent ascites requiring multiple paracenteses in the last 2 months. The last image-guided paracentesis was 04/11/2024 with 4 liters clear yellow fluid removed. FINDINGS: LOWER CHEST: The tip of an infusion catheter is again noted in the upper right atrium. The cardiac size is normal. Small new pericardial effusion. There are bilateral symmetric small new layering pleural effusions. The cardiac blood pool is less dense than the myocardium consistent with anemia. There are interstitial and diffuse  increased ground glass opacities of the right greater than left lung bases consistent with pneumonia, edema or combination. There is a 1 cm new thin walled air cyst in the dorsal lingular base concerning for a cavitating septic thrombus. There is posterior atelectasis. LIVER: Hypodense liver metastases are again noted, with a cluster of metastases in the right lobe, the largest is 2.5 cm, previously 2.2 cm. There is a stable segment 4 lesion measuring 1.7 cm. No new liver  abnormality is seen without contrast. GALLBLADDER AND BILE DUCTS: The gallbladder is absent and there is a common bile duct stent in place. There is no overt biliary dilatation within the limits of noncontrast technique. SPLEEN: The spleen is enlarged again measuring 18 cm in length. PANCREAS: Pancreatic head mass lesion with encasement of the SMV and severe mass effect effacing the portal vein, was better demonstrated with contrast although there likely has been no interval change. The pancreatic body and tail are unremarkable. ADRENAL GLANDS: The adrenal glands are unremarkable without contrast. KIDNEYS, URETERS AND BLADDER: The kidneys are unremarkable without contrast. No stones in the kidneys or ureters. No hydronephrosis. No perinephric or periureteral stranding. The bladder is catheterized and partially contracted but has a normal wall thickness. Small amount of air noted in the bladder. GI AND BOWEL: NGT is in place terminating in the distal stomach with unremarkable gastric wall. There are thickened folds in the small bowel but no small bowel obstruction. This probably represents congestive wall edema, alternatively enteritis . There is dense fluid throughout most of the colon, formed stool in the rectosigmoid segment, with uncomplicated sigmoid diverticulosis. No free air is seen. The last two studies demonstrated circumferential wall thickening in the ascending and transverse colon, but this is not seen today. PERITONEUM AND RETROPERITONEUM: There is moderate free ascites, but less than previously. Diffuse mesenteric congestive change. No free air is seen. No free hemorrhage. VASCULATURE: Aorta is normal in caliber. Encasement of the SMV and severe mass effect effacing the portal vein (related to pancreatic head mass). LYMPH NODES: No lymphadenopathy. REPRODUCTIVE ORGANS: No acute abnormality. BONES AND SOFT TISSUES: Degenerative change thoracic and lumbar spine. No destructive bone lesions. . There is  diffuse worsening of edema throughout the body wall compatible with fluid overload and/or third spacing. No acute osseous abnormality. No focal soft tissue abnormality. IMPRESSION: 1. Pancreatic head mass with encasement of the SMV and severe mass effect effacing the portal vein, likely stable compared to prior contrast-enhanced study. No evidence of new metastatic disease in the abdomen or pelvis. 2. Hypodense liver metastases with slight interval increase of the largest right lobe lesion to 2.5 cm from 2.2 cm, and a stable segment 4 lesion measuring 1.7 cm. 3. Small new pericardial effusion and bilateral symmetric small new layering pleural effusions. 4. Right-greater-than-left basilar interstitial and ground-glass opacities that may reflect pneumonia, edema, or both. A new 1 cm thin-walled air cyst in the dorsal lingular base raises concern for a cavitating septic embolus. 5. Moderate free ascites, decreased from prior, with diffuse mesenteric congestion. 6. Thickened small-bowel folds likely reflecting congestive wall edema, alternatively enteritis . No small-bowel obstruction. 7. Interval resolution of prior circumferential wall thickening in the ascending and transverse colon. An appendix is not seen. . Electronically signed by: Francis Quam MD 04/23/2024 01:10 AM EST RP Workstation: [id number]   CT HEAD WO CONTRAST ( ) Result Date: 04/23/2024 CLINICAL DATA:  Initial evaluation for acute mental status change, unknown cause. EXAM: CT HEAD WITHOUT CONTRAST TECHNIQUE: Contiguous axial images  were obtained from the base of the skull through the vertex without intravenous contrast. RADIATION DOSE REDUCTION: This exam was performed according to the departmental dose-optimization program which includes automated exposure control, adjustment of the mA and/or kV according to patient size and/or use of iterative reconstruction technique. COMPARISON:  None Available. FINDINGS: Brain: Cerebral volume within normal  limits. No acute intracranial hemorrhage. No acute large vessel territory infarct. No mass lesion, midline shift or mass effect. No hydrocephalus or extra-axial fluid collection. Vascular: No abnormal hyperdense vessel. Skull: Scalp soft tissues within normal limits.  Calvarium intact. Sinuses/Orbits: Globes orbital soft tissues within normal limits. Paranasal sinuses are clear. Trace right mastoid effusion noted, of doubtful significance. Left mastoid air cells are largely clear. Other: None. IMPRESSION: Normal head CT. No acute intracranial abnormality identified. Electronically Signed   By: Morene Hoard M.D.   On: 04/23/2024 00:32   Rapid EEG Result Date: 04/22/2024 Gregg Lek, MD     04/22/2024  8:09 PM Patient Name: Nicole Johnson MRN: [id number] Epilepsy Attending: Lek Gregg Referring Physician/Provider: No ref. provider found     Date: 04/22/2024 Start time: 04/22/2024 at 1150 AM End Time: 04/22/2024 at 0210 PM Duration: 2 hours and 19 minutes Patient history: 63 year old woman with metastatic pancreatic adenocarcinoma presenting with worsening abdominal pain and worsening mental status with seizure like activity during round. Ceribell to evaluate for seizure. Description: This EEG was obtained using a 10 lead EEG system positioned circumferentially without any parasagittal coverage (rapid EEG). Computer selected EEG is reviewed as  well as background features and all clinically significant events. ABNORMALITY: Diffuse slowing IMPRESSION: This study is suggestive of a generalized brain dysfunction, such as encephalopathy, non specific etiology, likely related to medication side effect or toxic metabolic. No seizures seen during this recording. Lek Gregg [facility] Neu[facility]   DG Chest Port 1 View Result Date: 04/22/2024 EXAM: 1 VIEW(S) XRAY OF THE CHEST 04/22/2024 05:56:53 AM COMPARISON: 04/20/2024 CLINICAL HISTORY: Acute respiratory failure (HCC) FINDINGS: LINES, TUBES AND DEVICES:  Endotracheal tube in place with tip 1.5 cm above the carina. Esophageal temperature probe in place with tip 3.3 cm above the carina. Enteric tube in place with tip terminating over the stomach. Left IJ CVC in place with tip overlying the right atrium. Right-sided chest port removed. LUNGS AND PLEURA: Diffuse bilateral airspace opacities, improved. No pleural effusion. No pneumothorax. HEART AND MEDIASTINUM: No acute abnormality of the cardiac and mediastinal silhouettes. BONES AND SOFT TISSUES: No acute osseous abnormality. IMPRESSION: 1. Diffuse bilateral airspace opacities, improved. Electronically signed by: Franky Stanford MD 04/22/2024 11:37 AM EST RP Workstation: [id number]   IR REMOVAL TUN ACCESS W/ PORT W/O FL MOD SED Result Date: 04/20/2024 CLINICAL DATA:  Sepsis, bacteremia, port catheter removal requested EXAM: EXAM TUNNELED PORT CATHETER REMOVAL TECHNIQUE: Overlying skin prepped with chlorhexidine , draped in usual sterile fashion, infiltrated locally with 1% lidocaine . A small incision was made over the scar from previous placement. The port catheter was dissected free from the underlying soft tissues and removed intact. Catheter tip was cut and sent for culture as requested. Hemostasis was achieved. The port pocket was closed with deep interrupted and subcuticular continuous 3-0 Monocryl sutures, then covered with Dermabond. The patient tolerated the procedure well. COMPLICATIONS: COMPLICATIONS None immediate IMPRESSION: 1.  Technically successful tunneled Port catheter removal. Electronically Signed   By: JONETTA Faes M.D.   On: 04/20/2024 15:53   DG CHEST PORT 1 VIEW Result Date: 04/20/2024 CLINICAL DATA:  Central line placement. EXAM: PORTABLE  CHEST 1 VIEW COMPARISON:  04/19/2024. FINDINGS: Left internal jugular central venous catheter has its tip projecting in the right atrium. No pneumothorax. Endotracheal tube, nasogastric tube and right anterior chest wall Port-A-Cath are stable. Bilateral  airspace lung opacities are unchanged from the previous day's exam. IMPRESSION: 1. New left internal jugular central venous catheter has its tip in the right atrium. No pneumothorax. 2. No other change from the previous day's study. Persistent bilateral airspace lung opacities. Electronically Signed   By: Alm Parkins M.D.   On: 04/20/2024 12:53   DG Chest Port 1 View Result Date: 04/19/2024 EXAM: 1 VIEW(S) XRAY OF THE CHEST 04/19/2024 05:33:00 AM COMPARISON: Portable chest yesterday at 01:55 pm. CLINICAL HISTORY: 5626 Acute respiratory failure (HCC) 5626 Acute respiratory failure (HCC). FINDINGS: LINES, TUBES AND DEVICES: Ett terminates 3 cm from the carina. Esophageal temperature probe terminates at this same level. Ngt in the stomach but the intra-gastric course is out of view. Right IJ port catheter terminates in the upper right atrium. LUNGS AND PLEURA: Interstitial and patchy diffuse airspace disease of the bilateral lungs is again noted consistent with pneumonia, edema or combination, with small pleural effusions. No interval improvement or worsening is seen. No pneumothorax. HEART AND MEDIASTINUM: The mediastinum is stable. There is mild cardiomegaly, mild central vascular fullness. BONES AND SOFT TISSUES: Thoracic spondylosis and bridging enthesopathy. IMPRESSION: 1. Interstitial and patchy diffuse airspace disease of both lungs, consistent with pneumonia, edema, or a combination, with small pleural effusions, without interval change. 2. Mild cardiomegaly and mild central vascular congestion. Electronically signed by: Francis Quam MD 04/19/2024 07:47 AM EST RP Workstation: [id number]   DG CHEST PORT 1 VIEW Result Date: 04/17/2024 CLINICAL DATA:  Acute respiratory failure, hypoxia EXAM: PORTABLE CHEST 1 VIEW COMPARISON:  03/15/2024 FINDINGS: Single frontal view of the chest demonstrates stable right chest wall port. Cardiac silhouette is unremarkable. Interval development of multifocal bilateral  ground-glass airspace disease, right greater than left. No effusion or pneumothorax. Moderate gaseous distention of the stomach. IMPRESSION: 1. Multifocal bilateral ground-glass airspace disease, consistent with edema or widespread pneumonia. Electronically Signed   By: Ozell Daring M.D.   On: 04/17/2024 15:24   DG Chest Port 1 View Result Date: 04/17/2024 CLINICAL DATA:  [id number] Endotracheally intubated [id number] EXAM: PORTABLE CHEST - 1 VIEW COMPARISON:  April 17, 2024 8:34 a.m. FINDINGS: Right chest port terminates at the cavoatrial junction. Esophagogastric tube courses below the diaphragm with the distal tip not included in the field of view. Endotracheal tube in place terminating in the mid trachea. Similar extensive hazy airspace opacities throughout both lungs. No large pleural effusion or pneumothorax. No cardiomegaly. IMPRESSION: 1. No significant interval change to the lungs. 2. Endotracheal tube has been placed in the interim, terminating in the mid trachea. No pneumothorax. 3. Esophagogastric tube courses below the diaphragm with the distal tip not included in the field of view Electronically Signed   By: Rogelia Myers M.D.   On: 04/17/2024 15:22   DG Abd 1 View Result Date: 04/17/2024 CLINICAL DATA:  Enteric catheter placement EXAM: ABDOMEN - 1 VIEW COMPARISON:  04/11/2024 FINDINGS: Frontal view of the lower chest and upper abdomen was obtained, excluding the left flank by collimation. Enteric catheter passes below diaphragm, tip projects over the region of the pylorus. Metallic biliary stent overlies right upper quadrant. Nonspecific gaseous distension of the large and small bowel, with moderate stool throughout the colon. Patchy bibasilar airspace disease, right greater than left. IMPRESSION: 1. Enteric catheter tip projecting over  the gastric pylorus. 2. Nonspecific gaseous distention of the bowel, with moderate stool throughout the colon. 3. Bibasilar airspace disease. Electronically  Signed   By: Ozell Daring M.D.   On: 04/17/2024 15:21   ECHOCARDIOGRAM COMPLETE Result Date: 04/15/2024    ECHOCARDIOGRAM REPORT   Patient Name:   Nicole Johnson Date of Exam: 04/15/2024 Medical Rec #:  [id number]    Height:       65.0 in Accession #:    [id number]   Weight:       138.2 lb Date of Birth:  12/13/1960    BSA:          1.691 m Patient Age:    63 years     BP:           101/60 mmHg Patient Gender: F            HR:           120 bpm. Exam Location:  Inpatient Procedure: 2D Echo, Cardiac Doppler and Color Doppler (Both Spectral and Color            Flow Doppler were utilized during procedure). Indications:    Shock R57.9  History:        Patient has no prior history of Echocardiogram examinations.  Sonographer:    Tinnie Gosling RDCS Referring Phys: [id number] PAULA SOUTHERLY IMPRESSIONS  1. Left ventricular ejection fraction, by estimation, is 65 to 70%. The left ventricle has normal function. The left ventricle has no regional wall motion abnormalities. Left ventricular diastolic parameters are indeterminate. Elevated left atrial pressure.  2. Right ventricular systolic function is normal. The right ventricular size is normal. There is mildly elevated pulmonary artery systolic pressure. The estimated right ventricular systolic pressure is 36.5 mmHg.  3. Left atrial size was mildly dilated.  4. Right atrial size was mildly dilated.  5. The mitral valve is grossly normal. Trivial mitral valve regurgitation. No evidence of mitral stenosis.  6. Tricuspid valve regurgitation is moderate.  7. The aortic valve is tricuspid. Aortic valve regurgitation is not visualized. No aortic stenosis is present.  8. The inferior vena cava is dilated in size with >50% respiratory variability, suggesting right atrial pressure of 8 mmHg.  9. Rhythm strip during this exam demonstrates atrial fibrillation with rapid ventricular response. FINDINGS  Left Ventricle: Left ventricular ejection fraction, by estimation, is 65 to 70%.  The left ventricle has normal function. The left ventricle has no regional wall motion abnormalities. The left ventricular internal cavity size was normal in size. There is  no left ventricular hypertrophy. Left ventricular diastolic parameters are indeterminate. Elevated left atrial pressure. Right Ventricle: The right ventricular size is normal. No increase in right ventricular wall thickness. Right ventricular systolic function is normal. There is mildly elevated pulmonary artery systolic pressure. The tricuspid regurgitant velocity is 2.67  m/s, and with an assumed right atrial pressure of 8 mmHg, the estimated right ventricular systolic pressure is 36.5 mmHg. Left Atrium: Left atrial size was mildly dilated. Right Atrium: Right atrial size was mildly dilated. Pericardium: There is no evidence of pericardial effusion. Mitral Valve: The mitral valve is grossly normal. Trivial mitral valve regurgitation. No evidence of mitral valve stenosis. Tricuspid Valve: The tricuspid valve is normal in structure. Tricuspid valve regurgitation is moderate . No evidence of tricuspid stenosis. Aortic Valve: The aortic valve is tricuspid. Aortic valve regurgitation is not visualized. No aortic stenosis is present. Aortic valve mean gradient measures 5.8 mmHg. Aortic valve peak gradient  measures 12.2 mmHg. Aortic valve area, by VTI measures 1.99  cm. Pulmonic Valve: The pulmonic valve was normal in structure. Pulmonic valve regurgitation is trivial. No evidence of pulmonic stenosis. Aorta: The aortic root is normal in size and structure. Venous: The inferior vena cava is dilated in size with greater than 50% respiratory variability, suggesting right atrial pressure of 8 mmHg. IAS/Shunts: No atrial level shunt detected by color flow Doppler. EKG: Rhythm strip during this exam demonstrates atrial fibrillation.  LEFT VENTRICLE PLAX 2D LVIDd:         3.40 cm   Diastology LVIDs:         2.10 cm   LV e' medial:    9.79 cm/s LV PW:          0.90 cm   LV E/e' medial:  12.7 LV IVS:        0.90 cm   LV e' lateral:   14.00 cm/s LVOT diam:     1.80 cm   LV E/e' lateral: 8.9 LV SV:         47 LV SV Index:   28 LVOT Area:     2.54 cm  RIGHT VENTRICLE             IVC RV S prime:     21.00 cm/s  IVC diam: 2.10 cm TAPSE (M-mode): 2.4 cm LEFT ATRIUM             Index        RIGHT ATRIUM           Index LA diam:        3.80 cm 2.25 cm/m   RA Area:     15.10 cm LA Vol (A2C):   56.9 ml 33.65 ml/m  RA Volume:   40.30 ml  23.84 ml/m LA Vol (A4C):   50.7 ml 29.99 ml/m LA Biplane Vol: 55.6 ml 32.89 ml/m  AORTIC VALVE AV Area (Vmax):    2.08 cm AV Area (Vmean):   2.18 cm AV Area (VTI):     1.99 cm AV Vmax:           174.95 cm/s AV Vmean:          113.914 cm/s AV VTI:            0.237 m AV Peak Grad:      12.2 mmHg AV Mean Grad:      5.8 mmHg LVOT Vmax:         143.00 cm/s LVOT Vmean:        97.600 cm/s LVOT VTI:          0.185 m LVOT/AV VTI ratio: 0.78  AORTA Ao Root diam: 2.90 cm Ao Asc diam:  3.20 cm MITRAL VALVE                TRICUSPID VALVE MV Area (PHT): 8.52 cm     TR Peak grad:   28.5 mmHg MV Decel Time: 89 msec      TR Vmax:        267.00 cm/s MV E velocity: 124.00 cm/s                             SHUNTS                             Systemic VTI:  0.18 m  Systemic Diam: 1.80 cm Soyla Merck MD Electronically signed by Soyla Merck MD Signature Date/Time: 04/15/2024/10:02:39 AM    Final    US  Paracentesis Result Date: 04/12/2024 INDICATION: Patient with history of pancreatic cancer with recurrent malignant ascites; request received for diagnostic and therapeutic paracentesis. EXAM: ULTRASOUND GUIDED DIAGNOSTIC AND THERAPEUTIC PARACENTESIS MEDICATIONS: 8 mL 1% lidocaine  with epinephrine  to skin/subcutaneous tissue COMPLICATIONS: None immediate. PROCEDURE: Informed written consent was obtained from the patient after a discussion of the risks, benefits and alternatives to treatment. A timeout was performed prior to  the initiation of the procedure. Initial ultrasound scanning demonstrates a large amount of ascites within the right lower abdominal quadrant. The right lower abdomen was prepped and draped in the usual sterile fashion. 1% lidocaine  with epinephrine  was used for local anesthesia. Following this, a 19 gauge, 10-cm, Yueh catheter was introduced. An ultrasound image was saved for documentation purposes. The paracentesis was performed. The catheter was removed and a dressing was applied. The patient tolerated the procedure well without immediate post procedural complication. FINDINGS: A total of approximately 4.2 liters of slightly hazy, yellow fluid was removed. Samples were sent to the laboratory as requested by the clinical team. IMPRESSION: Successful ultrasound-guided diagnostic and therapeutic paracentesis yielding 4.2 liters of peritoneal fluid. Performed by: Franky Rakers, PA-C Electronically Signed   By: JONETTA Faes M.D.   On: 04/12/2024 00:13   CT ABDOMEN PELVIS W CONTRAST Result Date: 04/11/2024 EXAM: CT ABDOMEN AND PELVIS WITH CONTRAST 04/11/2024 11:35:03 AM TECHNIQUE: CT of the abdomen and pelvis was performed with the administration of 100 mL of iohexol  (OMNIPAQUE ) 300 MG/ML solution. Multiplanar reformatted images are provided for review. Automated exposure control, iterative reconstruction, and/or weight-based adjustment of the mA/kV was utilized to reduce the radiation dose to as low as reasonably achievable. COMPARISON: 03/15/2024 CLINICAL HISTORY: Abdominal pain, acute, nonlocalized. FINDINGS: LOWER CHEST: No acute abnormality. LIVER: Multiple rounded low densities are noted in the posterior segment of the right hepatic lobe which were not significantly changed compared to prior exam consistent with metastatic disease. Stable left hepatic lobe density is also noted concerning for metastatic disease. Left hepatic pneumobilia is again noted. GALLBLADDER AND BILE DUCTS: Gallbladder is unremarkable. No  biliary ductal dilatation. SPLEEN: Mild splenomegaly is noted. PANCREAS: Continued presence of infiltrative pancreatic head mass superior mesenteric and portal vein and probably celiac axis as noted on prior exam. ADRENAL GLANDS: No acute abnormality. KIDNEYS, URETERS AND BLADDER: No stones in the kidneys or ureters. No hydronephrosis. No perinephric or periureteral stranding. Urinary bladder is unremarkable. GI AND BOWEL: Stomach demonstrates no acute abnormality. Wall thickening of the right and transverse colon is noted which may represent edema or possibly infectious or inflammatory colitis. There is no bowel obstruction. PERITONEUM AND RETROPERITONEUM: Moderate ascites is noted. No free air. VASCULATURE: Aorta is normal in caliber. LYMPH NODES: No lymphadenopathy. REPRODUCTIVE ORGANS: No acute abnormality. BONES AND SOFT TISSUES: No acute osseous abnormality. No focal soft tissue abnormality. IMPRESSION: 1. Stable hepatic metastases in the right posterior segment and left hepatic lobe density consistent with metastasis. 2. Infiltrative pancreatic head mass with involvement of the superior mesenteric vein, portal vein, and probable celiac axis, unchanged. 3. Right and transverse colonic wall thickening, which may represent edema or infectious/inflammatory colitis. 4. Mild splenomegaly and moderate ascites. Electronically signed by: Lynwood Seip MD 04/11/2024 12:05 PM EST RP Workstation: [id number]   IR Paracentesis Result Date: 03/29/2024 INDICATION: Patient with a history of pancreatic cancer with recurrent ascites. Interventional Radiology asked to perform a  therapeutic paracentesis with 4 L max EXAM: ULTRASOUND GUIDED PARACENTESIS MEDICATIONS: 1% lidocaine  10 ml COMPLICATIONS: None immediate. PROCEDURE: Informed written consent was obtained from the patient after a discussion of the risks, benefits and alternatives to treatment. A timeout was performed prior to the initiation of the procedure. Initial  ultrasound scanning demonstrates a large amount of ascites within the left lower abdominal quadrant. The left lower abdomen was prepped and draped in the usual sterile fashion. 1% lidocaine  was used for local anesthesia. Following this, a 19 gauge, 7-cm, Yueh catheter was introduced. An ultrasound image was saved for documentation purposes. The paracentesis was performed. The catheter was removed and a dressing was applied. The patient tolerated the procedure well without immediate post procedural complication. Patient received post-procedure intravenous albumin ; see nursing notes for details. FINDINGS: A total of approximately 4 L of clear yellow fluid was removed. IMPRESSION: Successful ultrasound-guided paracentesis yielding 4 liters of peritoneal fluid. Procedure performed by Warren Dais, NP Electronically Signed   By: Ester Sides M.D.   On: 03/29/2024 09:52    Microbiology: Results for orders placed or performed during the hospital encounter of 04/11/24  Gram stain     Status: None   Collection Time: 04/11/24  4:37 PM   Specimen: PATH Cytology Peritoneal fluid  Result Value Ref Range Status   Specimen Description PERITONEAL  Final   Special Requests NONE  Final   Gram Stain   Final    NO ORGANISMS SEEN RARE PMNS Performed at [facility], [address] [place], [place] [address]    Report Status 04/11/2024 FINAL  Final  Aerobic/Anaerobic Culture w Gram Stain (surgical/deep wound)     Status: None   Collection Time: 04/11/24  4:37 PM   Specimen: PATH Cytology Peritoneal fluid  Result Value Ref Range Status   Specimen Description   Final    PERITONEAL Performed at [facility], [address], [place], [place] [address]    Special Requests   Final    NONE Performed at [facility], [address] [place], [place] [address]    Gram Stain RARE WBC SEEN NO ORGANISMS SEEN   Final   Culture   Final    No growth  aerobically or anaerobically. Performed at [facility], [address] [place], [place] [address]    Report Status 04/17/2024 FINAL  Final  Gastrointestinal Panel by PCR , Stool     Status: None   Collection Time: 04/14/24 10:57 PM   Specimen: Stool  Result Value Ref Range Status   Campylobacter species NOT DETECTED NOT DETECTED Final   Plesimonas shigelloides NOT DETECTED NOT DETECTED Final   Salmonella species NOT DETECTED NOT DETECTED Final   Yersinia enterocolitica NOT DETECTED NOT DETECTED Final   Vibrio species NOT DETECTED NOT DETECTED Final   Vibrio cholerae NOT DETECTED NOT DETECTED Final   Enteroaggregative E coli (EAEC) NOT DETECTED NOT DETECTED Final   Enteropathogenic E coli (EPEC) NOT DETECTED NOT DETECTED Final   Enterotoxigenic E coli (ETEC) NOT DETECTED NOT DETECTED Final   Shiga like toxin producing E coli (STEC) NOT DETECTED NOT DETECTED Final   Shigella/Enteroinvasive E coli (EIEC) NOT DETECTED NOT DETECTED Final   Cryptosporidium NOT DETECTED NOT DETECTED Final   Cyclospora cayetanensis NOT DETECTED NOT DETECTED Final   Entamoeba histolytica NOT DETECTED NOT DETECTED Final   Giardia lamblia NOT DETECTED NOT DETECTED Final   Adenovirus F40/41 NOT DETECTED NOT DETECTED Final  Astrovirus NOT DETECTED NOT DETECTED Final   Norovirus GI/GII NOT DETECTED NOT DETECTED Final   Rotavirus A NOT DETECTED NOT DETECTED Final   Sapovirus (I, II, IV, and V) NOT DETECTED NOT DETECTED Final    Comment: Performed at [facility], [address] [place], [place] [address]  MRSA Next Gen by PCR, Nasal     Status: None   Collection Time: 04/16/24 11:05 AM   Specimen: Nasal Mucosa; Nasal Swab  Result Value Ref Range Status   MRSA by PCR Next Gen NOT DETECTED NOT DETECTED Final    Comment: (NOTE) The GeneXpert MRSA Assay (FDA approved for NASAL specimens only), is one component of a comprehensive MRSA colonization surveillance program. It is not intended to  diagnose MRSA infection nor to guide or monitor treatment for MRSA infections. Test performance is not FDA approved in patients less than 8 years old. Performed at [facility], [address], [place], [place] [address]   Culture, blood (Routine X 2) w Reflex to ID Panel     Status: Abnormal   Collection Time: 04/17/24  9:06 AM   Specimen: BLOOD RIGHT HAND  Result Value Ref Range Status   Specimen Description   Final    BLOOD RIGHT HAND Performed at [facility], [address], [place], [place] [address]    Special Requests   Final    BOTTLES DRAWN AEROBIC AND ANAEROBIC Blood Culture adequate volume Performed at [facility], [address], [place], [place] [address]    Culture  Setup Time   Final    GRAM POSITIVE COCCI IN BOTH AEROBIC AND ANAEROBIC BOTTLES RBV ELLEN JACKSON PHARM D 04/18/2024 @ 0530 BY DD GRAM NEGATIVE RODS ANAEROBIC BOTTLE ONLY    Culture (A)  Final    ENTEROCOCCUS FAECALIS KLEBSIELLA AEROGENES ENTEROCOCCUS GALLINARUM CRITICAL RESULT CALLED TO, READ BACK BY AND VERIFIED WITH: PHARMD A ELLINGTON [phone] FCP SUSCEPTIBILITIES PERFORMED ON PREVIOUS CULTURE WITHIN THE LAST 5 DAYS. Performed at [facility], [address] [place], [place] [address]    Report Status 04/23/2024 FINAL  Final   Organism ID, Bacteria ENTEROCOCCUS FAECALIS  Final   Organism ID, Bacteria KLEBSIELLA AEROGENES  Final      Susceptibility   Klebsiella aerogenes - MIC*    CEFEPIME  0.25 SENSITIVE Sensitive     ERTAPENEM <=0.12 SENSITIVE Sensitive     CEFTRIAXONE  32 RESISTANT Resistant     CIPROFLOXACIN  <=0.06 SENSITIVE Sensitive     GENTAMICIN <=1 SENSITIVE Sensitive     MEROPENEM <=0.25 SENSITIVE Sensitive     TRIMETH/SULFA <=20 SENSITIVE Sensitive     PIP/TAZO Value in next row Resistant      >=128 RESISTANTThis is a modified FDA-approved test that has been validated and its performance characteristics determined by the  reporting laboratory.  This laboratory is certified under the Clinical Laboratory Improvement Amendments CLIA as qualified to perform high complexity clinical laboratory testing.    * KLEBSIELLA AEROGENES   Enterococcus faecalis - MIC*    AMPICILLIN  Value in next row Sensitive      >=128 RESISTANTThis is a modified FDA-approved test that has been validated and its performance characteristics determined by the reporting laboratory.  This laboratory is certified under the Clinical Laboratory Improvement Amendments CLIA as qualified to perform high complexity clinical laboratory testing.    VANCOMYCIN Value in next row Sensitive      >=128 RESISTANTThis is a modified FDA-approved test that has been validated  and its performance characteristics determined by the reporting laboratory.  This laboratory is certified under the Clinical Laboratory Improvement Amendments CLIA as qualified to perform high complexity clinical laboratory testing.    GENTAMICIN SYNERGY Value in next row Sensitive      >=128 RESISTANTThis is a modified FDA-approved test that has been validated and its performance characteristics determined by the reporting laboratory.  This laboratory is certified under the Clinical Laboratory Improvement Amendments CLIA as qualified to perform high complexity clinical laboratory testing.    * ENTEROCOCCUS FAECALIS  Culture, blood (Routine X 2) w Reflex to ID Panel     Status: Abnormal   Collection Time: 04/17/24  9:06 AM   Specimen: BLOOD LEFT HAND  Result Value Ref Range Status   Specimen Description   Final    BLOOD LEFT HAND Performed at [facility], [address], [place], [place] [address]    Special Requests   Final    BOTTLES DRAWN AEROBIC AND ANAEROBIC Blood Culture adequate volume Performed at [facility], [address], [place], [place] [address]    Culture  Setup Time   Final    GRAM POSITIVE COCCI IN BOTH AEROBIC AND ANAEROBIC BOTTLES CRITICAL  VALUE NOTED.  VALUE IS CONSISTENT WITH PREVIOUSLY REPORTED AND CALLED VALUE. Performed at [facility], [address] [place], [place] [address]    Culture (A)  Final    ENTEROCOCCUS FAECALIS SUSCEPTIBILITIES PERFORMED ON PREVIOUS CULTURE WITHIN THE LAST 5 DAYS. ENTEROCOCCUS GALLINARUM    Report Status 04/23/2024 FINAL  Final   Organism ID, Bacteria ENTEROCOCCUS GALLINARUM  Final      Susceptibility   Enterococcus gallinarum - MIC*    AMPICILLIN  <=2 SENSITIVE Sensitive     VANCOMYCIN RESISTANT Resistant     GENTAMICIN SYNERGY SENSITIVE Sensitive     * ENTEROCOCCUS GALLINARUM  Blood Culture ID Panel (Reflexed)     Status: Abnormal   Collection Time: 04/17/24  9:06 AM  Result Value Ref Range Status   Enterococcus faecalis DETECTED (A) NOT DETECTED Final    Comment: CRITICAL RESULT CALLED TO, READ BACK BY AND VERIFIED WITH: RBV ELLEN JACKSON PHARM D 04/18/2024 @ 0530 BY DD    Enterococcus Faecium NOT DETECTED NOT DETECTED Final   Listeria monocytogenes NOT DETECTED NOT DETECTED Final   Staphylococcus species NOT DETECTED NOT DETECTED Final   Staphylococcus aureus (BCID) NOT DETECTED NOT DETECTED Final   Staphylococcus epidermidis NOT DETECTED NOT DETECTED Final   Staphylococcus lugdunensis NOT DETECTED NOT DETECTED Final   Streptococcus species NOT DETECTED NOT DETECTED Final   Streptococcus agalactiae NOT DETECTED NOT DETECTED Final   Streptococcus pneumoniae NOT DETECTED NOT DETECTED Final   Streptococcus pyogenes NOT DETECTED NOT DETECTED Final   A.calcoaceticus-baumannii NOT DETECTED NOT DETECTED Final   Bacteroides fragilis NOT DETECTED NOT DETECTED Final   Enterobacterales DETECTED (A) NOT DETECTED Final    Comment: Enterobacterales represent a large order of gram negative bacteria, not a single organism. CRITICAL RESULT CALLED TO, READ BACK BY AND VERIFIED WITH: RBV ELLEN JACKSON PHARM D 04/18/2024 @ 0530 BY DD    Enterobacter cloacae complex NOT DETECTED NOT  DETECTED Final   Escherichia coli NOT DETECTED NOT DETECTED Final   Klebsiella aerogenes DETECTED (A) NOT DETECTED Final    Comment: CRITICAL RESULT CALLED TO, READ BACK BY AND VERIFIED WITH: RBV ELLEN JACKSON PHARM D 04/18/2024 @ 0530 BY DD    Klebsiella oxytoca NOT DETECTED NOT DETECTED Final   Klebsiella  pneumoniae NOT DETECTED NOT DETECTED Final   Proteus species NOT DETECTED NOT DETECTED Final   Salmonella species NOT DETECTED NOT DETECTED Final   Serratia marcescens NOT DETECTED NOT DETECTED Final   Haemophilus influenzae NOT DETECTED NOT DETECTED Final   Neisseria meningitidis NOT DETECTED NOT DETECTED Final   Pseudomonas aeruginosa NOT DETECTED NOT DETECTED Final   Stenotrophomonas maltophilia NOT DETECTED NOT DETECTED Final   Candida albicans NOT DETECTED NOT DETECTED Final   Candida auris NOT DETECTED NOT DETECTED Final   Candida glabrata NOT DETECTED NOT DETECTED Final   Candida krusei NOT DETECTED NOT DETECTED Final   Candida parapsilosis NOT DETECTED NOT DETECTED Final   Candida tropicalis NOT DETECTED NOT DETECTED Final   Cryptococcus neoformans/gattii NOT DETECTED NOT DETECTED Final   CTX-M ESBL NOT DETECTED NOT DETECTED Final   Carbapenem resistance IMP NOT DETECTED NOT DETECTED Final   Carbapenem resistance KPC NOT DETECTED NOT DETECTED Final   Carbapenem resistance NDM NOT DETECTED NOT DETECTED Final   Carbapenem resist OXA 48 LIKE NOT DETECTED NOT DETECTED Final   Vancomycin resistance NOT DETECTED NOT DETECTED Final   Carbapenem resistance VIM NOT DETECTED NOT DETECTED Final    Comment: Performed at [facility], [address] [place], [place] [address]  Body fluid culture w Gram Stain     Status: None   Collection Time: 04/17/24 12:59 PM   Specimen: Ascitic; Body Fluid  Result Value Ref Range Status   Specimen Description   Final    ASCITIC Performed at [facility], [address], [place], [place] [address]    Special Requests    Final    NONE Performed at [facility], [address], [place], [place] [address]    Gram Stain NO WBC SEEN NO ORGANISMS SEEN   Final   Culture   Final    NO GROWTH 3 DAYS Performed at [facility], [address], [place], [place] [address]    Report Status 04/20/2024 FINAL  Final  Culture, Respiratory w Gram Stain     Status: None   Collection Time: 04/17/24 11:41 PM   Specimen: Tracheal Aspirate; Respiratory  Result Value Ref Range Status   Specimen Description   Final    TRACHEAL ASPIRATE Performed at [facility], [address], [place], [place] [address]    Special Requests   Final    NONE Performed at [facility], [address], [place], [place] [address]    Gram Stain   Final    FEW WBC PRESENT, PREDOMINANTLY PMN NO ORGANISMS SEEN Performed at [facility], [address] [place], [place] [address]    Culture RARE KLEBSIELLA AEROGENES  Final   Report Status 04/20/2024 FINAL  Final   Organism ID, Bacteria KLEBSIELLA AEROGENES  Final      Susceptibility   Klebsiella aerogenes - MIC*    CEFEPIME  <=0.12 SENSITIVE Sensitive     ERTAPENEM <=0.12 SENSITIVE Sensitive     CEFTRIAXONE  <=0.25 SENSITIVE Sensitive     CIPROFLOXACIN  <=0.06 SENSITIVE Sensitive     GENTAMICIN <=1 SENSITIVE Sensitive     MEROPENEM <=0.25 SENSITIVE Sensitive     TRIMETH/SULFA <=20 SENSITIVE Sensitive     PIP/TAZO Value in next row Sensitive      <=4 SENSITIVEThis is a modified FDA-approved test that has been validated and its performance characteristics determined by the reporting laboratory.  This laboratory is certified under the Clinical Laboratory  Improvement Amendments CLIA as qualified to perform high complexity clinical laboratory testing.    * RARE KLEBSIELLA AEROGENES  Culture, blood (Routine X 2) w Reflex to ID Panel     Status: Abnormal (Preliminary result)   Collection Time: 04/18/24  5:35 PM   Specimen: BLOOD  RIGHT HAND  Result Value Ref Range Status   Specimen Description   Final    BLOOD RIGHT HAND Performed at [facility], [address] [place], [place] [address]    Special Requests   Final    BOTTLES DRAWN AEROBIC AND ANAEROBIC Blood Culture adequate volume Performed at [facility], [address], [place], [place] [address]    Culture  Setup Time   Final    YEAST AEROBIC BOTTLE ONLY CRITICAL RESULT CALLED TO, READ BACK BY AND VERIFIED WITH: PHARMD CRYSTAL ROBERTSON [id number] AT 1635 BY EC    Culture (A)  Final    CANDIDA GLABRATA Sent to [facility] for further susceptibility testing. Performed at [facility], [address] [place], [place] [address]    Report Status PENDING  Incomplete  Blood Culture ID Panel (Reflexed)     Status: Abnormal   Collection Time: 04/18/24  5:35 PM  Result Value Ref Range Status   Enterococcus faecalis NOT DETECTED NOT DETECTED Final   Enterococcus Faecium NOT DETECTED NOT DETECTED Final   Listeria monocytogenes NOT DETECTED NOT DETECTED Final   Staphylococcus species NOT DETECTED NOT DETECTED Final   Staphylococcus aureus (BCID) NOT DETECTED NOT DETECTED Final   Staphylococcus epidermidis NOT DETECTED NOT DETECTED Final   Staphylococcus lugdunensis NOT DETECTED NOT DETECTED Final   Streptococcus species NOT DETECTED NOT DETECTED Final   Streptococcus agalactiae NOT DETECTED NOT DETECTED Final   Streptococcus pneumoniae NOT DETECTED NOT DETECTED Final   Streptococcus pyogenes NOT DETECTED NOT DETECTED Final   A.calcoaceticus-baumannii NOT DETECTED NOT DETECTED Final   Bacteroides fragilis NOT DETECTED NOT DETECTED Final   Enterobacterales NOT DETECTED NOT DETECTED Final   Enterobacter cloacae complex NOT DETECTED NOT DETECTED Final   Escherichia coli NOT DETECTED NOT DETECTED Final   Klebsiella aerogenes NOT DETECTED NOT DETECTED Final   Klebsiella oxytoca NOT DETECTED NOT DETECTED Final   Klebsiella pneumoniae  NOT DETECTED NOT DETECTED Final   Proteus species NOT DETECTED NOT DETECTED Final   Salmonella species NOT DETECTED NOT DETECTED Final   Serratia marcescens NOT DETECTED NOT DETECTED Final   Haemophilus influenzae NOT DETECTED NOT DETECTED Final   Neisseria meningitidis NOT DETECTED NOT DETECTED Final   Pseudomonas aeruginosa NOT DETECTED NOT DETECTED Final   Stenotrophomonas maltophilia NOT DETECTED NOT DETECTED Final   Candida albicans NOT DETECTED NOT DETECTED Final   Candida auris NOT DETECTED NOT DETECTED Final   Candida glabrata DETECTED (A) NOT DETECTED Final    Comment: CRITICAL RESULT CALLED TO, READ BACK BY AND VERIFIED WITH: PHARMD CRYSTAL ROBERTSON [id number] AT 1635 BY EC    Candida krusei NOT DETECTED NOT DETECTED Final   Candida parapsilosis NOT DETECTED NOT DETECTED Final   Candida tropicalis NOT DETECTED NOT DETECTED Final   Cryptococcus neoformans/gattii NOT DETECTED NOT DETECTED Final    Comment: Performed at [facility], [address], [place], [place] [address]  Yeast Susceptibilities     Status: None (Preliminary result)   Collection Time: 04/18/24  5:35 PM  Result Value Ref Range Status   SOURCE PENDING  Incomplete   Organism ID, Yeast Preliminary report  Final  Comment: (NOTE) Specimen has been received and testing has been initiated. Performed At: [facility] [address] [place], [place] [id number] Jennette Shorter MD Ey:[id number]    Amphotericin B MIC PENDING  Incomplete   Anidulafungin MIC PENDING  Incomplete   Caspofungin MIC PENDING  Incomplete   Fluconazole Islt MIC PENDING  Incomplete   ISAVUCONAZOLE MIC PENDING  Incomplete   Itraconazole MIC PENDING  Incomplete   Micafungin  MIC PENDING  Incomplete   Posaconazole MIC PENDING  Incomplete   REZAFUNGIN MIC PENDING  Incomplete   Voriconazole MIC PENDING  Incomplete  Culture, blood (Routine X 2) w Reflex to ID Panel     Status: None   Collection Time: 04/18/24  5:39 PM   Specimen:  BLOOD RIGHT HAND  Result Value Ref Range Status   Specimen Description   Final    BLOOD RIGHT HAND Performed at [facility], [address] [place], [place] [address]    Special Requests   Final    BOTTLES DRAWN AEROBIC ONLY Blood Culture results may not be optimal due to an inadequate volume of blood received in culture bottles Performed at [facility], [address], [place], [place] [address]    Culture   Final    NO GROWTH 5 DAYS Performed at [facility], [address] [place], [place] [address]    Report Status 04/23/2024 FINAL  Final  Cath Tip Culture     Status: None   Collection Time: 04/20/24 11:19 AM   Specimen: Porta Cath; Other  Result Value Ref Range Status   Specimen Description   Final    PORTA CATH Performed at [facility], [address], [place], [place] [address]    Special Requests   Final    NONE Performed at [facility], [address], [place], [place] [address]    Culture   Final    NO GROWTH 2 DAYS Performed at [facility], [address], [place], [place] [address]    Report Status 04/23/2024 FINAL  Final    Labs: CBC: Recent Labs  Lab 04/21/24 0538 04/22/24 0511 04/22/24 0740 04/23/24 0511 04/24/24 0443 04/25/24 0633 04/26/24 0401 04/27/24 0409  WBC 5.6 4.3   < > 8.3 9.6 8.2 5.1 11.3*  NEUTROABS 5.2 4.0  --  7.7 9.0*  --   --   --   HGB 7.4* 7.1*   < > 9.5* 8.8* 7.8* 7.0* 7.4*  HCT 22.5* 20.7*   < > 27.5* 26.6* 23.8* 21.2* 23.0*  MCV 84.6 83.8   < > 83.3 87.5 88.5 89.8 91.3  PLT 26* 18*   < > 22* 25* 33* 33* 42*   < > = values in this interval not displayed.   Basic Metabolic Panel: Recent Labs  Lab 04/23/24 0511 04/24/24 0443 04/25/24 0633 04/25/24 1651 04/26/24 0401 04/27/24 0409  NA 139 143 146*  --  148* 154*  K 3.2* 3.2* 2.6* 3.5 2.8* 3.2*  CL 105 109 110  --  112* 117*  CO2 21* 21* 22  --  23 21*  GLUCOSE 120* 118* 109*  --  123* 111*   BUN 68* 72* 75*  --  78* 85*  CREATININE 1.70* 1.74* 1.86*  --  2.09* 2.17*  CALCIUM  7.9* 8.0* 8.2*  --  8.5* 8.2*  MG 2.6* 2.8* 3.2*  --  2.9* 2.9*  PHOS 4.0 4.7* 4.7*  --  5.1* 5.2*  Liver Function Tests: No results for input(s): AST, ALT, ALKPHOS, BILITOT, PROT, ALBUMIN  in the last 168 hours. CBG: Recent Labs  Lab 04/26/24 1626 04/26/24 1959 04/26/24 2304 04/27/24 0411 04/27/24 0729  GLUCAP 98 106* 102* 103* 102*    Discharge time spent: greater than 30 minutes.  Signed: Toribio Door, MD [facility] 04/27/2024

## 2024-04-27 NOTE — TOC Transition Note (Signed)
 Transition of Care [facility]) - Discharge Note   Patient Details  Name: Nicole Johnson MRN: [id number] Date of Birth: [birth year]  Transition of Care [facility]) CM/SW Contact:  NORMAN ASPEN, LCSW Phone Number: 04/27/2024, 2:00 PM   Clinical Narrative:     Alerted today that plan now for pt to dc home with hospice services and family requesting [facility].  Referral already placed with [facility] and Magdalena Berber, RN liaison assisting family with needed DME delivery.  She has confirmed that pt will need ambulance transport home and [facility] RN to see pt this evening for admission.   [facility] called at 1400 for transport home.  No further IP CM needs.  Final next level of care: Home w Hospice Care Barriers to Discharge: Barriers Resolved   Patient Goals and CMS Choice Patient states their goals for this hospitalization and ongoing recovery are:: home     [facility] ownership interest in [facility].provided to:: Patient    Discharge Placement                       Discharge Plan and Services Additional resources added to the After Visit Summary for     Discharge Planning Services: CM Consult            DME Arranged: N/A DME Agency: NA       HH Arranged: RN HH Agency: [facility] Date HH Agency Contacted: 04/27/24   Representative spoke with at [facility] Agency: Magdalena Berber, RN  Social Drivers of Health (SDOH) Interventions SDOH Screenings   Food Insecurity: No Food Insecurity (04/12/2024)  Housing: Low Risk  (04/12/2024)  Transportation Needs: No Transportation Needs (04/12/2024)  Utilities: Not At Risk (04/12/2024)  Depression (PHQ2-9): Low Risk  (08/25/2023)  Financial Resource Strain: Low Risk  (06/08/2023)   Received from [facility]  Social Connections: Moderately Isolated (06/08/2023)  Tobacco Use: Low Risk  (04/12/2024)     Readmission Risk Interventions    04/12/2024    6:06 PM 04/04/2023   10:55 AM 12/17/2022   12:58 PM   Readmission Risk Prevention Plan  Post Dischage Appt  Complete Complete  Medication Screening  Complete Complete  Transportation Screening Complete Complete Complete  PCP or Specialist Appt within 3-5 Days Complete    HRI or Home Care Consult Complete    Social Work Consult for Recovery Care Planning/Counseling Complete    Palliative Care Screening Not Applicable    Medication Review Oceanographer) Complete

## 2024-04-27 NOTE — Progress Notes (Signed)
   04/27/24 1645  Spiritual Encounters  Type of Visit Follow up  Care provided to: Pt and family  Referral source Chaplain team;Physician  Reason for visit Urgent spiritual support  OnCall Visit No   Per referral by Elia Pee and Dr Clayton of palliative team, I was alerted to care plan to transport Mrs [place] home with hospice. Arrived just as transport did.  I offered compassionate presence and affirmed choice for care at home. I met Mr Shanen Norris, and Mrs Mottram's sister also at bedside. I shared my care for them and my gratitude that they may be together at home and include their son, Donnice especially.  Carolanne Mercier L. Delores HERO.Div

## 2024-04-27 NOTE — Hospital Course (Addendum)
 63 year old woman PMH including metastatic pancreatic adenocarcinoma to liver, pancytopenia, cirrhosis with ascites, presenting with worsening abdominal pain, nausea, vomiting.   Consultants Oncology PCCM Palliative Medicine ID

## 2024-04-28 LAB — YEAST SUSCEPTIBILITIES
Amphotericin B MIC: 0.5
Fluconazole Islt MIC: 8
ISAVUCONAZOLE MIC: 0.12
Itraconazole MIC: 0.5
Posaconazole MIC: 0.5
Voriconazole MIC: 0.25

## 2024-04-29 LAB — CULTURE, BLOOD (ROUTINE X 2): Special Requests: ADEQUATE

## 2024-04-30 ENCOUNTER — Ambulatory Visit: Payer: Self-pay | Admitting: Family Medicine

## 2024-05-24 DEATH — deceased
